# Patient Record
Sex: Male | Born: 1960
Health system: Southern US, Community
[De-identification: ages and names within clinical notes are randomized; demographics above are authoritative.]

## PROBLEM LIST (undated history)

## (undated) ENCOUNTER — Emergency Department (HOSPITAL_COMMUNITY): Admission: EM | Payer: Self-pay

## (undated) DIAGNOSIS — M1990 Unspecified osteoarthritis, unspecified site: Secondary | ICD-10-CM

## (undated) DIAGNOSIS — F319 Bipolar disorder, unspecified: Secondary | ICD-10-CM

## (undated) DIAGNOSIS — R011 Cardiac murmur, unspecified: Secondary | ICD-10-CM

## (undated) DIAGNOSIS — I10 Essential (primary) hypertension: Secondary | ICD-10-CM

## (undated) DIAGNOSIS — Z87442 Personal history of urinary calculi: Secondary | ICD-10-CM

## (undated) DIAGNOSIS — N200 Calculus of kidney: Secondary | ICD-10-CM

## (undated) DIAGNOSIS — B1920 Unspecified viral hepatitis C without hepatic coma: Secondary | ICD-10-CM

## (undated) DIAGNOSIS — F419 Anxiety disorder, unspecified: Secondary | ICD-10-CM

## (undated) DIAGNOSIS — S2239XA Fracture of one rib, unspecified side, initial encounter for closed fracture: Secondary | ICD-10-CM

## (undated) DIAGNOSIS — M4620 Osteomyelitis of vertebra, site unspecified: Secondary | ICD-10-CM

## (undated) DIAGNOSIS — F329 Major depressive disorder, single episode, unspecified: Secondary | ICD-10-CM

## (undated) DIAGNOSIS — F32A Depression, unspecified: Secondary | ICD-10-CM

## (undated) DIAGNOSIS — I959 Hypotension, unspecified: Secondary | ICD-10-CM

## (undated) HISTORY — PX: CHOLECYSTECTOMY: SHX55

## (undated) HISTORY — PX: OTHER SURGICAL HISTORY: SHX169

## (undated) HISTORY — PX: LITHOTRIPSY: SUR834

---
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
## 1991-05-24 DIAGNOSIS — S2249XA Multiple fractures of ribs, unspecified side, initial encounter for closed fracture: Secondary | ICD-10-CM

## 1991-05-24 HISTORY — DX: Multiple fractures of ribs, unspecified side, initial encounter for closed fracture: S22.49XA

## 1999-01-08 ENCOUNTER — Emergency Department (HOSPITAL_COMMUNITY): Admission: EM | Admit: 1999-01-08 | Discharge: 1999-01-08 | Payer: Self-pay | Admitting: Emergency Medicine

## 1999-01-08 ENCOUNTER — Encounter: Payer: Self-pay | Admitting: Emergency Medicine

## 2001-06-28 ENCOUNTER — Encounter (INDEPENDENT_AMBULATORY_CARE_PROVIDER_SITE_OTHER): Payer: Self-pay | Admitting: Specialist

## 2001-06-28 ENCOUNTER — Encounter: Payer: Self-pay | Admitting: Emergency Medicine

## 2001-06-28 ENCOUNTER — Inpatient Hospital Stay (HOSPITAL_COMMUNITY): Admission: EM | Admit: 2001-06-28 | Discharge: 2001-06-29 | Payer: Self-pay | Admitting: Emergency Medicine

## 2001-06-29 ENCOUNTER — Encounter: Payer: Self-pay | Admitting: Urology

## 2001-07-08 ENCOUNTER — Emergency Department (HOSPITAL_COMMUNITY): Admission: EM | Admit: 2001-07-08 | Discharge: 2001-07-08 | Payer: Self-pay | Admitting: Emergency Medicine

## 2001-07-08 ENCOUNTER — Encounter: Payer: Self-pay | Admitting: Emergency Medicine

## 2001-07-19 ENCOUNTER — Ambulatory Visit (HOSPITAL_BASED_OUTPATIENT_CLINIC_OR_DEPARTMENT_OTHER): Admission: RE | Admit: 2001-07-19 | Discharge: 2001-07-19 | Payer: Self-pay | Admitting: Urology

## 2001-07-19 ENCOUNTER — Encounter: Payer: Self-pay | Admitting: Urology

## 2002-05-23 ENCOUNTER — Emergency Department (HOSPITAL_COMMUNITY): Admission: EM | Admit: 2002-05-23 | Discharge: 2002-05-24 | Payer: Self-pay | Admitting: Emergency Medicine

## 2006-11-10 ENCOUNTER — Ambulatory Visit: Payer: Self-pay | Admitting: Internal Medicine

## 2006-11-13 ENCOUNTER — Ambulatory Visit: Payer: Self-pay | Admitting: Internal Medicine

## 2006-11-17 ENCOUNTER — Ambulatory Visit: Payer: Self-pay | Admitting: Internal Medicine

## 2007-02-19 DIAGNOSIS — F319 Bipolar disorder, unspecified: Secondary | ICD-10-CM | POA: Insufficient documentation

## 2007-03-13 ENCOUNTER — Ambulatory Visit: Payer: Self-pay | Admitting: Internal Medicine

## 2007-04-13 ENCOUNTER — Emergency Department (HOSPITAL_COMMUNITY): Admission: EM | Admit: 2007-04-13 | Discharge: 2007-04-13 | Payer: Self-pay | Admitting: Emergency Medicine

## 2007-04-25 ENCOUNTER — Ambulatory Visit (HOSPITAL_COMMUNITY): Admission: RE | Admit: 2007-04-25 | Discharge: 2007-04-25 | Payer: Self-pay | Admitting: Urology

## 2007-06-13 ENCOUNTER — Ambulatory Visit: Payer: Self-pay | Admitting: Internal Medicine

## 2007-06-13 LAB — CONVERTED CEMR LAB
Alkaline Phosphatase: 60 units/L (ref 39–117)
CO2: 24 meq/L (ref 19–32)
Calcium: 9.3 mg/dL (ref 8.4–10.5)
Creatinine, Ser: 0.85 mg/dL (ref 0.40–1.50)
Hep A IgM: NEGATIVE
Hepatitis B Surface Ag: NEGATIVE
Indirect Bilirubin: 0.3 mg/dL (ref 0.0–0.9)
Sodium: 138 meq/L (ref 135–145)
Total Bilirubin: 0.4 mg/dL (ref 0.3–1.2)
Total Protein: 7.6 g/dL (ref 6.0–8.3)

## 2007-06-22 ENCOUNTER — Ambulatory Visit: Payer: Self-pay | Admitting: *Deleted

## 2007-07-05 ENCOUNTER — Ambulatory Visit: Payer: Self-pay | Admitting: Gastroenterology

## 2007-08-15 ENCOUNTER — Ambulatory Visit: Payer: Self-pay | Admitting: Internal Medicine

## 2007-09-03 ENCOUNTER — Ambulatory Visit (HOSPITAL_COMMUNITY): Admission: RE | Admit: 2007-09-03 | Discharge: 2007-09-03 | Payer: Self-pay | Admitting: Gastroenterology

## 2007-09-03 ENCOUNTER — Encounter (INDEPENDENT_AMBULATORY_CARE_PROVIDER_SITE_OTHER): Payer: Self-pay | Admitting: Diagnostic Radiology

## 2007-09-12 ENCOUNTER — Ambulatory Visit: Payer: Self-pay | Admitting: Internal Medicine

## 2007-10-18 ENCOUNTER — Ambulatory Visit: Payer: Self-pay | Admitting: Gastroenterology

## 2007-10-19 ENCOUNTER — Ambulatory Visit: Payer: Self-pay | Admitting: Internal Medicine

## 2007-10-19 LAB — CONVERTED CEMR LAB
AST: 25 units/L (ref 0–37)
Albumin: 4.6 g/dL (ref 3.5–5.2)
Alkaline Phosphatase: 57 units/L (ref 39–117)
BUN: 15 mg/dL (ref 6–23)
CO2: 27 meq/L (ref 19–32)
Creatinine, Ser: 0.93 mg/dL (ref 0.40–1.50)
Eosinophils Absolute: 0.2 10*3/uL (ref 0.0–0.7)
Lymphocytes Relative: 28 % (ref 12–46)
Monocytes Relative: 6 % (ref 3–12)
Neutro Abs: 5.5 10*3/uL (ref 1.7–7.7)
Neutrophils Relative %: 63 % (ref 43–77)
Platelets: 241 10*3/uL (ref 150–400)
Potassium: 4.4 meq/L (ref 3.5–5.3)
Sodium: 140 meq/L (ref 135–145)

## 2007-10-20 ENCOUNTER — Emergency Department (HOSPITAL_COMMUNITY): Admission: EM | Admit: 2007-10-20 | Discharge: 2007-10-21 | Payer: Self-pay | Admitting: Emergency Medicine

## 2007-10-23 ENCOUNTER — Ambulatory Visit: Payer: Self-pay | Admitting: Internal Medicine

## 2007-10-24 ENCOUNTER — Emergency Department (HOSPITAL_COMMUNITY): Admission: EM | Admit: 2007-10-24 | Discharge: 2007-10-24 | Payer: Self-pay | Admitting: Emergency Medicine

## 2007-12-06 ENCOUNTER — Encounter: Admission: RE | Admit: 2007-12-06 | Discharge: 2007-12-27 | Payer: Self-pay | Admitting: Orthopedic Surgery

## 2007-12-13 ENCOUNTER — Ambulatory Visit: Payer: Self-pay | Admitting: Gastroenterology

## 2007-12-27 ENCOUNTER — Ambulatory Visit: Payer: Self-pay | Admitting: Internal Medicine

## 2007-12-27 LAB — CONVERTED CEMR LAB
AST: 29 units/L (ref 0–37)
Alkaline Phosphatase: 71 units/L (ref 39–117)
BUN: 15 mg/dL (ref 6–23)
Basophils Absolute: 0 10*3/uL (ref 0.0–0.1)
Chloride: 105 meq/L (ref 96–112)
Glucose, Bld: 94 mg/dL (ref 70–99)
Lymphs Abs: 2.5 10*3/uL (ref 0.7–4.0)
MCHC: 32.9 g/dL (ref 30.0–36.0)
MCV: 96.9 fL (ref 78.0–100.0)
Monocytes Absolute: 0.5 10*3/uL (ref 0.1–1.0)
Monocytes Relative: 6 % (ref 3–12)
Neutro Abs: 5.3 10*3/uL (ref 1.7–7.7)
Sodium: 137 meq/L (ref 135–145)
TSH: 0.412 microintl units/mL (ref 0.350–4.50)

## 2008-01-03 ENCOUNTER — Ambulatory Visit: Payer: Self-pay | Admitting: Internal Medicine

## 2008-01-24 ENCOUNTER — Ambulatory Visit: Payer: Self-pay | Admitting: Internal Medicine

## 2008-02-13 ENCOUNTER — Ambulatory Visit: Payer: Self-pay | Admitting: Internal Medicine

## 2008-02-14 ENCOUNTER — Ambulatory Visit: Payer: Self-pay | Admitting: Gastroenterology

## 2008-02-21 ENCOUNTER — Ambulatory Visit: Payer: Self-pay | Admitting: Gastroenterology

## 2008-03-06 ENCOUNTER — Ambulatory Visit: Payer: Self-pay | Admitting: Gastroenterology

## 2008-04-03 ENCOUNTER — Ambulatory Visit: Payer: Self-pay | Admitting: Gastroenterology

## 2008-05-01 ENCOUNTER — Ambulatory Visit: Payer: Self-pay | Admitting: Gastroenterology

## 2008-05-29 ENCOUNTER — Ambulatory Visit: Payer: Self-pay | Admitting: Gastroenterology

## 2008-06-03 ENCOUNTER — Ambulatory Visit: Payer: Self-pay | Admitting: Internal Medicine

## 2008-06-26 ENCOUNTER — Ambulatory Visit: Payer: Self-pay | Admitting: Gastroenterology

## 2008-07-24 ENCOUNTER — Ambulatory Visit: Payer: Self-pay | Admitting: Gastroenterology

## 2008-07-27 ENCOUNTER — Emergency Department (HOSPITAL_COMMUNITY): Admission: EM | Admit: 2008-07-27 | Discharge: 2008-07-27 | Payer: Self-pay | Admitting: Emergency Medicine

## 2008-08-07 ENCOUNTER — Ambulatory Visit: Payer: Self-pay | Admitting: Gastroenterology

## 2008-08-25 ENCOUNTER — Emergency Department (HOSPITAL_COMMUNITY): Admission: EM | Admit: 2008-08-25 | Discharge: 2008-08-25 | Payer: Self-pay

## 2008-08-28 ENCOUNTER — Ambulatory Visit: Payer: Self-pay | Admitting: Internal Medicine

## 2008-11-14 ENCOUNTER — Observation Stay (HOSPITAL_COMMUNITY): Admission: EM | Admit: 2008-11-14 | Discharge: 2008-11-14 | Payer: Self-pay | Admitting: Emergency Medicine

## 2008-11-16 ENCOUNTER — Emergency Department (HOSPITAL_COMMUNITY): Admission: EM | Admit: 2008-11-16 | Discharge: 2008-11-16 | Payer: Self-pay | Admitting: Emergency Medicine

## 2008-11-17 ENCOUNTER — Inpatient Hospital Stay (HOSPITAL_COMMUNITY): Admission: EM | Admit: 2008-11-17 | Discharge: 2008-11-20 | Payer: Self-pay | Admitting: Emergency Medicine

## 2008-11-19 ENCOUNTER — Encounter (INDEPENDENT_AMBULATORY_CARE_PROVIDER_SITE_OTHER): Payer: Self-pay | Admitting: General Surgery

## 2009-09-07 ENCOUNTER — Ambulatory Visit: Payer: Self-pay | Admitting: Internal Medicine

## 2009-09-07 LAB — CONVERTED CEMR LAB
ALT: 28 units/L (ref 0–53)
AST: 28 units/L (ref 0–37)
Albumin: 4.9 g/dL (ref 3.5–5.2)
BUN: 13 mg/dL (ref 6–23)
Basophils Absolute: 0.1 10*3/uL (ref 0.0–0.1)
CO2: 18 meq/L — ABNORMAL LOW (ref 19–32)
Chloride: 101 meq/L (ref 96–112)
Creatinine, Ser: 0.89 mg/dL (ref 0.40–1.50)
Eosinophils Absolute: 0.1 10*3/uL (ref 0.0–0.7)
HCT: 50.6 % (ref 39.0–52.0)
Hemoglobin: 16.4 g/dL (ref 13.0–17.0)
LDL Cholesterol: 148 mg/dL — ABNORMAL HIGH (ref 0–99)
MCV: 95.5 fL (ref 78.0–100.0)
Monocytes Absolute: 0.5 10*3/uL (ref 0.1–1.0)
Monocytes Relative: 7 % (ref 3–12)
Neutrophils Relative %: 73 % (ref 43–77)
RDW: 14 % (ref 11.5–15.5)
Sodium: 137 meq/L (ref 135–145)
Total Bilirubin: 0.6 mg/dL (ref 0.3–1.2)
Total CHOL/HDL Ratio: 4.4
Total Protein: 7.7 g/dL (ref 6.0–8.3)
VLDL: 24 mg/dL (ref 0–40)
WBC: 7.6 10*3/uL (ref 4.0–10.5)

## 2010-07-28 ENCOUNTER — Emergency Department (HOSPITAL_COMMUNITY)
Admission: EM | Admit: 2010-07-28 | Discharge: 2010-07-29 | Discharge status: Against Medical Advice | Payer: Self-pay | Attending: Emergency Medicine | Admitting: Emergency Medicine

## 2010-07-28 DIAGNOSIS — Z0389 Encounter for observation for other suspected diseases and conditions ruled out: Secondary | ICD-10-CM | POA: Insufficient documentation

## 2010-07-28 LAB — URINALYSIS, ROUTINE W REFLEX MICROSCOPIC
Hgb urine dipstick: NEGATIVE
Ketones, ur: NEGATIVE mg/dL
Nitrite: NEGATIVE
Protein, ur: NEGATIVE mg/dL

## 2010-08-30 LAB — DIFFERENTIAL
Basophils Relative: 0 % (ref 0–1)
Basophils Relative: 1 % (ref 0–1)
Eosinophils Absolute: 0.2 10*3/uL (ref 0.0–0.7)
Lymphocytes Relative: 15 % (ref 12–46)
Lymphocytes Relative: 9 % — ABNORMAL LOW (ref 12–46)
Lymphs Abs: 0.5 10*3/uL — ABNORMAL LOW (ref 0.7–4.0)
Lymphs Abs: 0.9 10*3/uL (ref 0.7–4.0)
Lymphs Abs: 1.1 10*3/uL (ref 0.7–4.0)
Monocytes Absolute: 0.4 10*3/uL (ref 0.1–1.0)
Monocytes Relative: 5 % (ref 3–12)
Monocytes Relative: 7 % (ref 3–12)
Neutro Abs: 3.9 10*3/uL (ref 1.7–7.7)
Neutro Abs: 4.7 10*3/uL (ref 1.7–7.7)
Neutrophils Relative %: 82 % — ABNORMAL HIGH (ref 43–77)

## 2010-08-30 LAB — URINALYSIS, ROUTINE W REFLEX MICROSCOPIC
Bilirubin Urine: NEGATIVE
Glucose, UA: NEGATIVE mg/dL
Nitrite: NEGATIVE
Protein, ur: NEGATIVE mg/dL
Specific Gravity, Urine: 1.022 (ref 1.005–1.030)
Urobilinogen, UA: 0.2 mg/dL (ref 0.0–1.0)
pH: 6.5 (ref 5.0–8.0)

## 2010-08-30 LAB — COMPREHENSIVE METABOLIC PANEL
ALT: 70 U/L — ABNORMAL HIGH (ref 0–53)
AST: 51 U/L — ABNORMAL HIGH (ref 0–37)
AST: 59 U/L — ABNORMAL HIGH (ref 0–37)
Albumin: 2.9 g/dL — ABNORMAL LOW (ref 3.5–5.2)
Albumin: 2.9 g/dL — ABNORMAL LOW (ref 3.5–5.2)
Albumin: 4 g/dL (ref 3.5–5.2)
Alkaline Phosphatase: 217 U/L — ABNORMAL HIGH (ref 39–117)
Alkaline Phosphatase: 336 U/L — ABNORMAL HIGH (ref 39–117)
BUN: 7 mg/dL (ref 6–23)
CO2: 24 mEq/L (ref 19–32)
Calcium: 8.7 mg/dL (ref 8.4–10.5)
Creatinine, Ser: 0.82 mg/dL (ref 0.4–1.5)
GFR calc Af Amer: >60 mL/min (ref >60)
GFR calc Af Amer: >60 mL/min (ref >60)
GFR calc Af Amer: >60 mL/min (ref >60)
GFR calc non Af Amer: >60 mL/min (ref >60)
GFR calc non Af Amer: >60 mL/min (ref >60)
Glucose, Bld: 114 mg/dL — ABNORMAL HIGH (ref 70–99)
Glucose, Bld: 82 mg/dL (ref 70–99)
Potassium: 3.6 mEq/L (ref 3.5–5.1)
Potassium: 3.7 mEq/L (ref 3.5–5.1)
Potassium: 3.9 mEq/L (ref 3.5–5.1)
Sodium: 136 mEq/L (ref 135–145)
Sodium: 136 mEq/L (ref 135–145)
Sodium: 138 mEq/L (ref 135–145)
Total Bilirubin: 4.4 mg/dL — ABNORMAL HIGH (ref 0.3–1.2)
Total Protein: 5.4 g/dL — ABNORMAL LOW (ref 6.0–8.3)
Total Protein: 5.5 g/dL — ABNORMAL LOW (ref 6.0–8.3)
Total Protein: 7.1 g/dL (ref 6.0–8.3)

## 2010-08-30 LAB — RAPID URINE DRUG SCREEN, HOSP PERFORMED
Amphetamines: NOT DETECTED
Barbiturates: NOT DETECTED
Barbiturates: NOT DETECTED
Benzodiazepines: POSITIVE — AB
Cocaine: NOT DETECTED
Opiates: NOT DETECTED

## 2010-08-30 LAB — CBC
HCT: 40.5 % (ref 39.0–52.0)
HCT: 41.4 % (ref 39.0–52.0)
Hemoglobin: 13.6 g/dL (ref 13.0–17.0)
Hemoglobin: 14.1 g/dL (ref 13.0–17.0)
MCHC: 34.5 g/dL (ref 30.0–36.0)
MCV: 96.1 fL (ref 78.0–100.0)
Platelets: 203 10*3/uL (ref 150–400)
Platelets: 216 10*3/uL (ref 150–400)
RBC: 4.11 MIL/uL — ABNORMAL LOW (ref 4.22–5.81)
RBC: 4.29 MIL/uL (ref 4.22–5.81)
RDW: 13.1 % (ref 11.5–15.5)
RDW: 13.2 % (ref 11.5–15.5)
WBC: 7.3 10*3/uL (ref 4.0–10.5)

## 2010-08-30 LAB — POCT I-STAT, CHEM 8
Chloride: 100 mEq/L (ref 96–112)
Creatinine, Ser: 1.1 mg/dL (ref 0.4–1.5)
Hemoglobin: 13.6 g/dL (ref 13.0–17.0)
Potassium: 3.6 mEq/L (ref 3.5–5.1)

## 2010-08-30 LAB — POCT CARDIAC MARKERS
CKMB, poc: 1.2 ng/mL (ref 1.0–8.0)
CKMB, poc: <1 ng/mL — ABNORMAL LOW (ref 1.0–8.0)
CKMB, poc: <1 ng/mL — ABNORMAL LOW (ref 1.0–8.0)
Myoglobin, poc: 34.1 ng/mL (ref 12–200)
Myoglobin, poc: 70.5 ng/mL (ref 12–200)
Troponin i, poc: <0.05 ng/mL (ref 0.00–0.09)

## 2010-08-30 LAB — APTT: aPTT: 29 seconds (ref 24–37)

## 2010-08-30 LAB — LIPASE, BLOOD: Lipase: 17 U/L (ref 11–59)

## 2010-08-30 LAB — PROTIME-INR: INR: 1 (ref 0.00–1.49)

## 2010-10-05 NOTE — Consult Note (Signed)
**Note Howard via Obfuscation** Fox, Howard NO.:  [id number]   MEDICAL RECORD NO.:  [id number]          PATIENT TYPE:  INP   LOCATION:  5118                         FACILITY:  [facility]   PHYSICIAN:  Jordan Hawks. [name], MD    DATE OF BIRTH:  24-May-1960   DATE OF CONSULTATION:  11/18/2008  DATE OF DISCHARGE:                                 CONSULTATION   REASON FOR CONSULTATION:  Probable choledocholithiasis.   REFERRING PHYSICIAN:  [facility].   HISTORY OF PRESENT ILLNESS:  This is a 50 year old gentleman with a past  medical history bipolar, hepatitis C, depression, chronic back pain, and  renal calculi, who was admitted to the hospital with complaints of right  upper quadrant epigastric pain.  The patient states that has been  ongoing on for several days and he also noted a change in his urine  color in that it was much darker.  The patient reports intermittent  history of this type of pain over the past 2 months; however, his pain  would abate and he would not have any symptoms.  Unfortunately of late,  the pain has been more constant.  Because of this issue, he has  presented to the emergency room on several occasions, and he has been  evaluated from a cardiac standpoint and ruled out.  During this  admission, he was found to have elevated transaminases and subsequently,  he was admitted to the hospital for further evaluation and treatment.  During the evaluation, he underwent a HIDA scan and was noted that there  is no visualization of the gallbladder suggestive of acute  cholecystitis.  Additionally, the patient appears to have an obstructive  pattern to his transaminases.   PAST MEDICAL HISTORY AND PAST SURGICAL HISTORY:  As stated above with  the addition of lung resection, lithotripsy x2, and multiple  intraabdominal injuries.   MEDICATIONS:  Lamictal, Celexa, amitriptyline, Dilaudid, Vicodin, and  Avelox.   ALLERGIES:  No known drug allergies.   FAMILY  HISTORY:  Noncontributory.   SOCIAL HISTORY:  Negative for alcohol, tobacco, or illicit drug use.   PHYSICAL EXAMINATION:  VITAL SIGNS:  Blood pressure is 140/79, heart  rate 81, pulse ox is 97% on room air, and respirations 14.  GENERAL:  The patient is in no acute distress, alert and oriented.  HEENT:  Normocephalic and atraumatic.  Extraocular muscles intact.  NECK:  Supple.  No lymphadenopathy.  LUNGS:  Clear to auscultation bilaterally.  CARDIOVASCULAR:  Regular rate and rhythm.  ABDOMEN:  Obese and soft.  Tender in the epigastrium and some in the  right upper quadrant.  No rebound or rigidity.  Positive bowel sounds.  EXTREMITIES:  No clubbing, cyanosis, or edema.   LABORATORY VALUES:  White blood cell count is 5.3, hemoglobin 13.5, and  platelets are 216.  Sodium 136, potassium 3.7, chloride 104, CO2 25,  glucose 82, BUN 6, creatinine 0.8, total bilirubin 4.4, alk phos 237,  AST 59, and ALT 84.   The ultrasound does reveal dilated CBD at 9 mm as well as intrahepatic  biliary ductal dilation and  multiple gallstones.   IMPRESSION:  1. Possible choledocholithiasis.  2. Cholelithiasis, possible cholecystis.   In light of his liver enzyme values, an ERCP is not unreasonable at this  time, and there is a high likelihood that he has also passed the stone.  His transaminases are declining, but the bilirubin shows mild increase,  however, it is known that this is the last value to start falling.  But  in light of the dilated ducts, again he is not unreasonable to perform  an ERCP.   Plan at this time is to perform the procedure and further  recommendations pending the findings.      Jordan Hawks [name], MD  Electronically Signed     PDH/MEDQ  D:  11/18/2008  T:  11/19/2008  Job:  [id number]

## 2010-10-05 NOTE — Discharge Summary (Signed)
Howard Fox, LOYA NO.:  [id number]   MEDICAL RECORD NO.:  [id number]          PATIENT TYPE:  INP   LOCATION:  5118                         FACILITY:  [facility]   PHYSICIAN:  Ollen Gross. Vernell Morgans, M.D. DATE OF BIRTH:  1960/06/05   DATE OF ADMISSION:  11/17/2008  DATE OF DISCHARGE:  11/20/2008                               DISCHARGE SUMMARY   ADMITTING PHYSICIAN:  Almond Lint, MD   CONSULTANTS:  Jordan Hawks. [name], MD, with GI.   CHIEF COMPLAINT/REASON FOR ADMISSION:  Mr. Bias is a 50 year old male  patient with several stable medical problems.  The most concerning is  that he has hepatitis C, at admission unknown baseline total bilirubin.  He presented to the ER with right upper quadrant abdominal pain for  several days, had been seen initially on 1 ER visit, apparently labs  were normal.  The patient was not having any significant nausea or  vomiting and discharged home, but he represented back on date of  admission with recurrent pain and nausea and vomiting.  He was afebrile.  Vital signs were stable.  His abdomen slightly distended and soft,  tender in the right upper quadrant.  He was found to have a total  bilirubin which had increased from 2.4-4.9 with elevated alkaline  phosphatase, AST, and ALT.  Because of these findings, it was concerning  that the patient had at least biliary colic secondary to cholelithiasis  since he had gallstones seen on ultrasound and given his underlying  hepatitis C it is uncertain that whether these are his baseline LFTs or  if he has associated choledocholithiasis.  The patient was admitted for  observation and consideration for surgical intervention.   Through the early part of hospitalization, the patient was monitored  closely.  His pain improved with n.p.o. status and administration of IV  pain medications.  When he presented the first time to the ER, again his  total bilirubin was 2.8, alkaline phosphatase 250, ALT 159, and  AST 232.  By admission, the patient's total bilirubin had increased to 4.9,  alkaline phosphatase was up to 336, AST 182, and ALT 167.  We asked the  patient for permission to obtain records from Dr. Sharon Mt, his  [facility] gastroenterologist who has followed his hepatitis.  The  patient has completed medical therapy for this.  He was treated with  [facility] for his chronic HCV genotype I grade 2 with stage I  disease on liver biopsy on September 20, 2007.  He was noted on their  evaluation to have no stigma of chronic liver disease on physical exam.  The old chart revealed lab work, the LFTs that were sent were from 2006  and at that time the patient had normal total bilirubin and other LFTs.  Because of this, GI consultation was obtained, Dr. Luisa Hart came and  evaluated the patient and given the patient's symptoms which were  consistent with biliary colic and not chronic in nature it was felt with  the elevation in total bilirubin and the possible normal to mildly  elevated  baseline LFTs an ERCP was indicated and the patient  subsequently underwent an ERCP on November 18, 2008, that showed a mildly  dilated common bile duct without any stones.  No stent was indicated and  the procedure was discontinued.  The patient tolerated it well without  any post-ERCP pancreatitis.   Subsequently on November 19, 2008, the patient was taken to the OR by Dr.  Carolynne Edouard were he underwent laparoscopic cholecystectomy with a normal  intraoperative cholangiogram for cholecystitis and cholelithiasis.  On  postop day #1, the patient was stable, tolerating a diet and oral pain  medications.  He was afebrile.  Vital signs were within normal limits.  He was saturating 95% on room air.  His chemistries were normal.  His  AST had decreased to 51, ALT to 70, alkaline phosphatase to 217, and  total bilirubin was steady at 4.2.  His abdomen was soft.  Incisions  were clean, dry, and intact except he had some  oozing of bloody drainage  from the umbilical incision during the night, but this has stopped.  Plans were to discharge the patient home and follow up with Surgery in 2  weeks for wound check and follow up with Dr. Sharon Mt in the next 2-  4 weeks for repeat LFTs and general followup of hepatitis.   FINAL DISCHARGE DIAGNOSES:  1. Abdominal pain secondary to biliary colic.  2. Transaminitis, uncertain if related to chronic hepatitis C versus      recent choledocholithiasis.  3. Status post laparoscopic cholecystectomy with normal intraoperative      cholangiogram.  4. Hypertension.  5. History of bipolar disorder.   DISCHARGE MEDICATIONS:  The patient will resume the following  medications:  1. Lamictal 200 mg b.i.d.  2. Celexa 40 mg daily.  3. Amitriptyline 100 mg daily.  4. Vicodin 5/325 one to two tablets every 4 hours as needed for pain.      The patient previously had this medication upon arrival, so he has      been given a new script with 30 tablets, no refills.  5. Lisinopril and hydrochlorothiazide 20/25 daily.   Return to work in 2 weeks.   ADDITIONAL ACTIVITY INSTRUCTIONS:  Increase activity slowly.  May walk  up steps.  May shower.  No lifting greater than 15 pounds for 2 weeks.  No driving for 1 week.  May walk up steps.   DIET:  No restrictions.   WOUND CARE:  Allow Steri-Strips to fall off and remove bandages in the  morning.   ADDITIONAL INSTRUCTIONS:  You are to call physician, i.e., the surgeon,  if:  1:  Fever greater than or equal to 101.5 degrees Fahrenheit.  1. New or increased belly pain.  2. Nausea, vomiting, diarrhea, or constipation.  3. Redness or drainage from wounds.   FOLLOWUP APPOINTMENTS:  1. You should have an appointment with the doctor of the [facility]      with [facility], [phone], on December 02, 2008, at 2:30      p.m.  2. You need to contact Dr. Sharon Mt for followup regarding your      hepatitis and the  elevated LFTs and recheck of LFTs in 2-4 weeks.     Allison L. Rennis Harding, N.POllen Gross. Vernell Morgans, M.D.  Electronically Signed   ALE/MEDQ  D:  11/20/2008  T:  11/20/2008  Job:  [id number]   cc:   Alba Destine, MD

## 2010-10-05 NOTE — H&P (Signed)
Howard Fox, Howard Fox NO.:  [id number]   MEDICAL RECORD NO.:  [id number]          PATIENT TYPE:  INP   LOCATION:  5118                         FACILITY:  [facility]   PHYSICIAN:  Almond Lint, MD       DATE OF BIRTH:  [birth year]   DATE OF ADMISSION:  11/16/2008  DATE OF DISCHARGE:                              HISTORY & PHYSICAL   CHIEF COMPLAINT:  Abdominal pain.   HISTORY OF PRESENT ILLNESS:  Mr. Dolley is a 50 year old male with  hepatitis C and right upper quadrant pain from several days.  Today, he  came to the hospital in the ER, and had cardiac rule out.  His LFTs were  currently elevated because of the hepatitis C, and he sees Hepatology at  [facility] who has come out from [facility].  Currently, he has  pain in the epigastrium in the right upper quadrant.  He endorses  sensation of indigestion.  He has tried Pepto-Bismol and Tums and other  over-the-counter aids with no relief.  He has had some constipation but  no diarrhea.  His pain this morning got worse after eating a fried  chicken biscuit.  Today, he is unable to be pain free for more than an  hour with administration of IV narcotics.   PAST MEDICAL HISTORY:  Bipolar disorder, current back pain, depression,  hepatitis C, and renal calculi.   PAST SURGICAL HISTORY:  Lung resection, lithotripsy times at least 2 and  a fall in which Dr. Lenard Forth repaired multiple intra-abdominal injuries to  what he describes is a stomach, small and large intestines and Dr.  Kendal Hymen repaired some rib fractures.   MEDICATIONS:  Lamictal, Celexa, amitriptyline, Dilaudid, and Vicodin.   ALLERGIES:  None.   FAMILY HISTORY:  Significant family history of CVA and coronary artery  disease.   SOCIAL HISTORY:  He is accompanied by his mother.  He is a former smoker  within the last year.   PHYSICAL EXAMINATION:  VITAL SIGNS:  Temperature is 98, pulse 94, blood  pressure 147/83, 97% on room air, and respiratory rate  18.  GENERAL:  He is alert and oriented x3 and looks uncomfortable.  PSYCHIATRIC:  Mood and affect normal.  HEENT:  Normocephalic and atraumatic.  He does have scleral icterus.  CARDIOVASCULAR:  Regular rate and rhythm.  LUNGS:  Clear to auscultation bilaterally.  ABDOMEN:  Distended, soft, no fluid wave.  Positive tenderness with  flinching in right upper quadrant.  EXTREMITIES:  Warm.   Ultrasound demonstrates intra and extrahepatic biliary ductal dilatation  and many gallstones.  Lipase is 15.  Question of pancreatic mass.  Electrolytes are essentially normal with an elevated glucose of 125.  LFTs reveal bilirubin of 4.9, SGOT 182, 167, and alkaline phosphatase  66.  WBC is 5.7, H and H 13.6 and 39.5, platelet count 179,000.   ASSESSMENT AND PLAN:  Mr. Oki is a 50 year old male with symptomatic  cholelithiasis.  He is unable to be relieved of pain.  He will be given  IV fluids, IV antibiotics, IV pain medication, and he  will been n.p.o.  for  potential laparoscopic cholecystectomy.  The physician in the  morning in the morning nay elect to order a HIDA to confirm whether or  not he has cystic duct obstruction.      Almond Lint, MD  Electronically Signed     FB/MEDQ  D:  11/17/2008  T:  11/17/2008  Job:  [id number]

## 2010-10-05 NOTE — Op Note (Signed)
NAMEJEFFREE, CAZEAU NO.:  [id number]   MEDICAL RECORD NO.:  [id number]          PATIENT TYPE:  AMB   LOCATION:  DAY                          FACILITY:  [facility]   PHYSICIAN:  Maretta Bees. Vonita Moss, M.D.DATE OF BIRTH:  1960/11/19   DATE OF PROCEDURE:  04/25/2007  DATE OF DISCHARGE:                               OPERATIVE REPORT   __________ .   PREOPERATIVE DIAGNOSIS:  Left ureteral stone.   POSTOPERATIVE DIAGNOSIS:  Left ureteral stone, status post laser  lithotripsy.   OPERATION/PROCEDURE:  1. Pancystourethroscopy.  2. Left retrograde pyelography.  3. Left ureteroscopy with laser lithotripsy.  4. Placement of left ureteral stent, 6-French x 28 cm double-J.   INDICATIONS:  This is a 50 year old gentleman who came in with left-  sided stone.  He is scheduled electively today for laser lithotripsy of  that stone.   DESCRIPTION OF PROCEDURE:  The patient was brought back to the operating  room.  After performing the preoperative time out, giving him  preoperative antibiotics, and after this successful induction of general  endotracheal anesthesia, the patient was prepped and draped in the  dorsal lithotomy position, noting that all pressure points were padded  appropriately.  We then used a 22-French sheath with a 12.5 degree lens  to do pancystourethroscopy.  The patient's urethra appeared normal.  There is no evidence of stricture, tumor, foreign body, or any other  abnormality.  The patient's bladder also appeared normal.  There is no  evidence of tumor, foreign body, stone, diverticula, or any other  abnormality.  Both ureteral orifices were seen at the latter aspects of  the trigone effluxing clear urine.   At this point the left ureteral orifice was cannulated with an ureteral  catheter.  A retrograde pyelogram was shot.  This showed no evidence of  stricture.  However, a large 1.2 cm stone could be seen at the level of  the sacroiliac junction.  Then we  attempted to place a glide wire with  difficulty past the stone.  With advancing the ureteral catheter, the  glide wire was ultimately passed past the stone.  The ureteral catheter  was advanced past the stone and a stiff wire was placed as her safety  wire.  Then we used the semi-rigid ureteroscope to navigate the  patient's ureter.  However, at the level of the sacroiliac junction, we  had difficulty passing the scope.  The ureter at that point was slightly  tight.  We then used the access sheath to dilate this in a non-traumatic  fashion directly, using fluoroscopic assistance.  With the access sheath  in place, we used the flexible ureteroscope to visualize the stone.   With the stone visualized with the ureteroscope, we used the laser fiber  to systematically break up the stone into small fragments.  This was  done avoiding injury to the surrounding structures including the ureter.  In between lasering the stones, small fragments were removed with the  help of a nitinol basket.  Once all the stone had been broken up and we  attempted to remove  all stones with the basket, there were some small  fragments that were continued to be left in place.  Due to difficulty in  positioning the scope, we were unable to pull out these small fragments  and instead elected to place the ureteral stent rather than risk further  trauma to the ureter.  We replaced the glide wire, verified it  fluoroscopically and passed a 6 x 28 French ureter stent.  Good proximal  and distal curls were seen with the fluoroscopic vision, the bladder was  emptied and at this point the procedure was ended.   Please note Dr. Vonita Moss was present throughout the entirety of the  case.   ESTIMATED BLOOD LOSS:  Minimal.   URINARY OUTPUT:  Unrecorded.   DRAINS:  A 6 x 28 French left ureteral stent.   COMPLICATIONS:  None apparent.   SPECIMENS:  Stone for stone analysis.   DISPOSITION:  The patient went to  PACU.      Terie Purser, MD      Maretta Bees. Vonita Moss, M.D.  Electronically Signed    JH/MEDQ  D:  04/25/2007  T:  04/26/2007  Job:  [id number]

## 2010-10-05 NOTE — Op Note (Signed)
Howard Fox, Howard Fox NO.:  [id number]   MEDICAL RECORD NO.:  [id number]          PATIENT TYPE:  INP   LOCATION:  5118                         FACILITY:  [facility]   PHYSICIAN:  Ollen Gross. Vernell Morgans, M.D. DATE OF BIRTH:  12-22-1960   DATE OF PROCEDURE:  11/19/2008  DATE OF DISCHARGE:                               OPERATIVE REPORT   PREOPERATIVE DIAGNOSIS:  Cholecystitis with cholelithiasis.   POSTOPERATIVE DIAGNOSIS:  Cholecystitis with cholelithiasis.   PROCEDURE:  Laparoscopic cholecystectomy with intraoperative  cholangiogram.   SURGEON:  Ollen Gross. Vernell Morgans, MD   ASSISTANT:  Letha Cape, PA   ANESTHESIA:  General endotracheal.   PROCEDURE:  After informed consent was obtained, the patient was brought  to the operating room and placed in supine position on the operating  room table.  After adequate induction of general anesthesia, the  patient's abdomen was prepped with Hibiclens and draped in usual sterile  manner.  The area below the umbilicus was infiltrated with 0.25%  Marcaine.  A small incision was made with 15-blade knife.  This incision  was carried down through the subcutaneous tissue bluntly with hemostat  and [employer] retractors until linea alba was identified.  The linea  alba was incised with a 15-blade knife and each side was grasped with  Kocher clamps and elevated anteriorly.  The preperitoneal space was then  probed bluntly with a hemostat until the peritoneum was opened and  access was gained to the abdominal cavity.  A 0 Vicryl pursestring  stitch was placed in the fascia around the opening.  A Hasson cannula  was placed through the opening and anchored in place with previously  placed Vicryl pursestring stitch.  The abdomen was insufflated with  carbon dioxide without difficulty.  The patient was placed in reverse  Trendelenburg position and rotated with the right side up.  A  laparoscope was inserted through the Hasson cannula and the  right upper  quadrant was inspected.  The patient did have some adhesions along the  midline, but it did not interfere with our view of the right upper  quadrant.  Next, the epigastric region was infiltrated with 0.25%  Marcaine.  A small incision was made a 15-blade knife and a 10-mm port  was placed bluntly through this incision into the abdominal cavity under  direct vision.  Sites were then chosen laterally on the right side of  the abdomen for placement 5 mm ports.  Each of these areas were  infiltrated with 0.25% Marcaine.  Small stab incisions were made with a  15-blade knife.  The 5 mm ports were placed bluntly through these  incisions into the abdominal cavity under direct vision.  A blunt  grasper was placed through the lateral most 5-mm port and used to grasp  the dome of gallbladder and elevated anteriorly and superiorly.  There  were some omental adhesions to the body of the gallbladder which were  taken down with a blunt dissector and some with electrocautery.  Some of  these adhesions were also taken down with laparoscopic scissors close to  the duodenum.  Another blunt grasper was then placed through the other 5-  mm port and used to retract on the body and neck of the gallbladder.  A  dissector was used to open the peritoneal reflection at the gallbladder  neck area with the electrocautery.  Blunt dissection was carried out in  this area until the gallbladder neck and cystic duct junction was  readily identified and a good window was created.  A single clip was  placed on the gallbladder neck.  A small ductotomy was made just below  the clip with laparoscopic scissors.  A 14-gauge Angiocath was placed  percutaneously through the anterior abdominal wall under direct vision.  A Reddick cholangiogram catheter was placed through the Angiocath and  flushed.  The Reddick catheter was then placed within the cystic duct  and anchored in place with the clip.  Cholangiogram was  obtained that  showed no filling defects, good emptying in duodenum, and adequate  length on the cystic duct.  The anchoring clip and catheters were then  removed from the patient.  Three clips were placed proximally on the  cystic duct and duct was divided the 2 sets of clips.  Posterior to  this, the cystic artery was identified with a couple of branches.  Each  of these branches was dissected bluntly in circumferential manner until  a good window was created.  Two clips were placed proximally and one  distally on each of the branches and the arteries were divided between  the two.  Next, a laparoscopic hook cautery device was used to separate  the gallbladder from liver bed.  Prior to completely detaching the  gallbladder from liver bed, the liver bed was inspected and several  small bleeding points coagulated with electrocautery until the area was  completely hemostatic.  The gallbladder was then detached and wrested  away from liver bed without difficulty with the hook cautery.  Laparoscopic bag was inserted through the epigastric port.  The  gallbladder was placed in the bag and bag was sealed.  The abdomen was  then irrigated with copious amounts of saline until the effluent was  clear.  The laparoscope was moved to the epigastric port.  A gallbladder  grasper was placed through the Hasson cannula and used to grasp the  opening of the bag.  The bag with the gallbladder was removed with the  Hasson cannula through the infraumbilical port without difficulty.  The  fascial defect was closed with previously placed Vicryl pursestring  stitch as well as another figure-of-eight 0 Vicryl stitch.  The rest of  the ports were then removed and the gas was allowed to escape.  The skin  incisions were all closed with interrupted 4-0 Monocryl subcuticular  stitches and Dermabond dressings were applied.  The patient tolerated  the procedure well.  At the end of the case, all needle, sponge, and   instrument counts were correct.  The patient was awakened and taken to  recovery in stable condition.      Ollen Gross. Vernell Morgans, M.D.  Electronically Signed     PST/MEDQ  D:  11/19/2008  T:  11/20/2008  Job:  [id number]

## 2010-10-08 NOTE — Op Note (Signed)
[facility]  Patient:    Howard Fox, Howard Fox Visit Number: [id number] MRN: [id number]          Service Type: MED Location: [id number] Attending Physician:  Monica Becton Dictated by:   Claudette Laws, M.D. Proc. Date: 06/29/01 Admit Date:  06/28/2001                             Operative Report  PREOPERATIVE DIAGNOSES: 1. Large, obstructing calculus in the prostatic urethra. 2. A 1.6 cm obstructing distal left ureteral calculus.  POSTOPERATIVE DIAGNOSES: 1. Large, obstructing calculus in the prostatic urethra. 2. A 1.6 cm obstructing distal left ureteral calculus.  OPERATION: 1. Cystoscopy, and holmium laser lithotripsy of bladder calculus. 2. Left retrograde ureterogram, and insertion of a 6 French 26 cm double-J    stent.  SURGEON:  Claudette Laws, M.D.  HISTORY:  This is a 50 year old man, who presented to the emergency room late last evening with difficulty urinating.  He has a history of kidney stones.  A CAT scan showed an 8-9 mm stone in the prostatic urethra.  He also was noted to have a 1.6 cm stone obstructing the left kidney at the junction of the mid and distal left ureter.  However, his main complaint was difficulty urinating. He has passed several stones in the past which he brought in with him.  The patient otherwise is in good health.  No known drug allergies.  We went over treatment options, and the decision was made to proceed to lithotripsy of the prostatic stone and also insertion of a double-J stent for now and then further disposition pending treatment of the ureteral calculus.  He also has a large stone in the lower pole of the left kidney.  The patient understands and agrees to the proposed surgery.  DESCRIPTION OF PROCEDURE:  The patient was prepped and draped in the dorsal lithotomy position under intubated general anesthesia.  Urethroscopy confirmed the stone right at the bladder neck area which I was able to  push back into the bladder.  At this point, using the 22 French Olympus cystoscope and the small laser fiber and the VersaPulse laser, lithotripsy was performed upon the stone, breaking it up into multiple fine particles which then we subsequently irrigated out.  The rest of the bladder looked normal.  No tumors, no calculi, normal ureteral orifices, and a nonobstructing-appearing prostate.  Our attention was then turned to the left ureter where we intubated it with a .038 Glidewire through a 6 [place] open-ended ureteral catheter.  Retrograde studies showed a complete block right at the stone which was overlying the sacrum.  After about 30 minutes of manipulation, including the use of different guidewires and injection of Xylocaine jelly, I was finally able to negotiate the Glidewire by the stone up into the kidney.  We then backed out the open-ended catheter and put over a 6 French 26 cm double-J stent over the Glidewire and then passed it up without difficulty.  The proximal end was curled up in the renal pelvis.  The distal end was curled up in the bladder. The bladder was emptied.  I injected 10 cc of Xylocaine jelly per urethra and also a B&O suppository per rectum for bladder spasm and pain control.  The patient was then taken back to the recovery room in a satisfactory condition. Dictated by:   Claudette Laws, M.D. Attending Physician:  Javier Glazier  Homero Fellers DD:  06/29/01 TD:  06/29/01 Job: [id number]

## 2011-02-15 LAB — CBC
HCT: 47.5
MCHC: 34.8
MCV: 92.2
Platelets: 193
RDW: 13.1
WBC: 7.4

## 2011-03-01 LAB — BASIC METABOLIC PANEL
BUN: 18
Creatinine, Ser: 1.45
GFR calc non Af Amer: 53 — ABNORMAL LOW
Potassium: 3.8

## 2011-03-01 LAB — URINE MICROSCOPIC-ADD ON

## 2011-03-01 LAB — DIFFERENTIAL
Eosinophils Absolute: 0.1 — ABNORMAL LOW
Eosinophils Relative: 1
Lymphocytes Relative: 23
Lymphs Abs: 2.1
Monocytes Absolute: 0.9
Monocytes Relative: 10

## 2011-03-01 LAB — URINALYSIS, ROUTINE W REFLEX MICROSCOPIC
Glucose, UA: NEGATIVE
pH: 6

## 2011-03-01 LAB — CBC
Platelets: 203
WBC: 9.5

## 2012-08-26 ENCOUNTER — Emergency Department (HOSPITAL_COMMUNITY)
Admission: EM | Admit: 2012-08-26 | Discharge: 2012-08-26 | Disposition: A | Discharge status: Home / Self Care | Payer: Self-pay | Attending: Emergency Medicine | Admitting: Emergency Medicine

## 2012-08-26 ENCOUNTER — Emergency Department (HOSPITAL_COMMUNITY): Payer: Self-pay

## 2012-08-26 ENCOUNTER — Encounter (HOSPITAL_COMMUNITY): Payer: Self-pay | Admitting: *Deleted

## 2012-08-26 DIAGNOSIS — R109 Unspecified abdominal pain: Secondary | ICD-10-CM

## 2012-08-26 DIAGNOSIS — N200 Calculus of kidney: Secondary | ICD-10-CM | POA: Insufficient documentation

## 2012-08-26 DIAGNOSIS — Z79899 Other long term (current) drug therapy: Secondary | ICD-10-CM | POA: Insufficient documentation

## 2012-08-26 DIAGNOSIS — Z87442 Personal history of urinary calculi: Secondary | ICD-10-CM | POA: Insufficient documentation

## 2012-08-26 DIAGNOSIS — R110 Nausea: Secondary | ICD-10-CM | POA: Insufficient documentation

## 2012-08-26 DIAGNOSIS — F319 Bipolar disorder, unspecified: Secondary | ICD-10-CM | POA: Insufficient documentation

## 2012-08-26 DIAGNOSIS — R319 Hematuria, unspecified: Secondary | ICD-10-CM | POA: Insufficient documentation

## 2012-08-26 DIAGNOSIS — R1030 Lower abdominal pain, unspecified: Secondary | ICD-10-CM

## 2012-08-26 DIAGNOSIS — Z8619 Personal history of other infectious and parasitic diseases: Secondary | ICD-10-CM | POA: Insufficient documentation

## 2012-08-26 DIAGNOSIS — F17200 Nicotine dependence, unspecified, uncomplicated: Secondary | ICD-10-CM | POA: Insufficient documentation

## 2012-08-26 DIAGNOSIS — I10 Essential (primary) hypertension: Secondary | ICD-10-CM | POA: Insufficient documentation

## 2012-08-26 HISTORY — DX: Bipolar disorder, unspecified: F31.9

## 2012-08-26 HISTORY — DX: Essential (primary) hypertension: I10

## 2012-08-26 HISTORY — DX: Unspecified viral hepatitis C without hepatic coma: B19.20

## 2012-08-26 HISTORY — DX: Calculus of kidney: N20.0

## 2012-08-26 LAB — CBC
HCT: 45.6 % (ref 39.0–52.0)
MCH: 30.6 pg (ref 26.0–34.0)
MCV: 90.1 fL (ref 78.0–100.0)
Platelets: 218 10*3/uL (ref 150–400)
RDW: 12.5 % (ref 11.5–15.5)
WBC: 7.2 10*3/uL (ref 4.0–10.5)

## 2012-08-26 LAB — URINALYSIS, ROUTINE W REFLEX MICROSCOPIC
Bilirubin Urine: NEGATIVE
Glucose, UA: NEGATIVE mg/dL
Ketones, ur: NEGATIVE mg/dL
Nitrite: NEGATIVE
Protein, ur: NEGATIVE mg/dL
pH: 6.5 (ref 5.0–8.0)

## 2012-08-26 LAB — BASIC METABOLIC PANEL
BUN: 14 mg/dL (ref 6–23)
Calcium: 9.9 mg/dL (ref 8.4–10.5)
Chloride: 98 mEq/L (ref 96–112)
Creatinine, Ser: 0.86 mg/dL (ref 0.50–1.35)
GFR calc Af Amer: >90 mL/min (ref >90)

## 2012-08-26 MED ORDER — PERCOCET 5-325 MG PO TABS: 1.0000 | ORAL_TABLET | Freq: Four times a day (QID) | ORAL | Status: DC | PRN

## 2012-08-26 MED ORDER — HYDROMORPHONE HCL PF 1 MG/ML IJ SOLN: 1.0000 mg | Freq: Once | INTRAMUSCULAR | Status: DC

## 2012-08-26 MED ORDER — KETOROLAC TROMETHAMINE 30 MG/ML IJ SOLN: 30.0000 mg | Freq: Once | INTRAMUSCULAR | Status: DC

## 2012-08-26 MED ORDER — KETOROLAC TROMETHAMINE 30 MG/ML IJ SOLN
30.0000 mg | Freq: Once | INTRAMUSCULAR | Status: AC
  Administered 2012-08-26: 30 mg via INTRAVENOUS
  Filled 2012-08-26: qty 1

## 2012-08-26 MED ORDER — HYDROMORPHONE HCL PF 2 MG/ML IJ SOLN
2.0000 mg | Freq: Once | INTRAMUSCULAR | Status: AC
  Administered 2012-08-26: 2 mg via INTRAVENOUS
  Filled 2012-08-26: qty 1

## 2012-08-26 MED ORDER — ONDANSETRON HCL 4 MG/2ML IJ SOLN
4.0000 mg | Freq: Once | INTRAMUSCULAR | Status: AC
  Administered 2012-08-26: 4 mg via INTRAVENOUS
  Filled 2012-08-26: qty 2

## 2012-08-26 MED ORDER — NAPROXEN 375 MG PO TABS: 375.0000 mg | ORAL_TABLET | Freq: Two times a day (BID) | ORAL | Status: DC

## 2012-08-26 NOTE — ED Notes (Signed)
PA- Paz at bedside.  

## 2012-08-26 NOTE — ED Notes (Signed)
Pt remains in CT scan.

## 2012-08-26 NOTE — ED Notes (Signed)
Pt states for the past 3 weeks has been passing a kidney stone, states now having lower abdominal to penis pain, states has a hx of kidney stones.

## 2012-08-26 NOTE — ED Provider Notes (Signed)
History     CSN: [id number]  Arrival date & time 08/26/12  1400   First MD Initiated Contact with Patient 08/26/12 1423      Chief Complaint  Patient presents with  . Nephrolithiasis    (Consider location/radiation/quality/duration/timing/severity/associated sxs/prior treatment) Patient is a 52 y.o. male presenting with flank pain. The history is provided by the patient.  Flank Pain This is a recurrent problem. Episode onset: 3 weeks ago, worsened 3-4 dys ago. The problem occurs constantly. The problem has been gradually worsening. Associated symptoms include abdominal pain, nausea and urinary symptoms (hematuria and 2 days ago urine was brown). Pertinent negatives include no anorexia, arthralgias, change in bowel habit, chest pain, chills, congestion, coughing, diaphoresis, fatigue, fever, headaches, neck pain, numbness, rash, sore throat, vomiting or weakness. The symptoms are aggravated by walking, twisting and coughing. Treatments tried: 200mg  ibprofen x 4. The treatment provided no relief.    Past Medical History  Diagnosis Date  . Kidney stones   . Bipolar 1 disorder   . Hepatitis C   . Hypertension     Past Surgical History  Procedure Laterality Date  . Cholecystectomy      History reviewed. No pertinent family history.  History  Substance Use Topics  . Smoking status: Current Every Day Smoker  . Smokeless tobacco: Never Used  . Alcohol Use: Yes      Review of Systems  Constitutional: Positive for activity change. Negative for fever, chills, diaphoresis and fatigue.  HENT: Negative for ear pain, congestion, sore throat, neck pain and sinus pressure.   Eyes: Negative for visual disturbance.  Respiratory: Negative for cough.   Cardiovascular: Negative for chest pain.  Gastrointestinal: Positive for nausea and abdominal pain. Negative for vomiting, anorexia and change in bowel habit.  Genitourinary: Positive for urgency, flank pain and difficulty urinating.  Negative for dysuria.  Musculoskeletal: Negative for arthralgias.  Skin: Negative for color change and rash.  Neurological: Negative for dizziness, weakness, numbness and headaches.  Psychiatric/Behavioral: Negative for confusion.  All other systems reviewed and are negative.    Allergies  Codeine  Home Medications   Current Outpatient Rx  Name  Route  Sig  Dispense  Refill  . ALPRAZolam (XANAX) 1 MG tablet   Oral   Take 1 mg by mouth 2 (two) times daily.         . citalopram (CELEXA) 40 MG tablet   Oral   Take 40 mg by mouth daily.         Marland Kitchen ibuprofen (ADVIL,MOTRIN) 200 MG tablet   Oral   Take 800 mg by mouth every 6 (six) hours as needed for pain.         Marland Kitchen lamoTRIgine (LAMICTAL) 100 MG tablet   Oral   Take 100 mg by mouth every other day.         . lisinopril-hydrochlorothiazide (PRINZIDE,ZESTORETIC) 20-25 MG per tablet   Oral   Take 1 tablet by mouth daily.           BP 132/77  Pulse 107  Temp(Src) 98.8 F (37.1 C) (Oral)  Resp 18  SpO2 98%  Physical Exam  Nursing note and vitals reviewed. Constitutional: He is oriented to person, place, and time. He appears well-developed and well-nourished. He appears distressed.  HENT:  Head: Normocephalic and atraumatic.  Eyes: Conjunctivae and EOM are normal.  Neck: Normal range of motion. Neck supple.  Cardiovascular: Normal rate, regular rhythm and normal heart sounds.   Pulmonary/Chest: Effort normal.  Abdominal: Soft. Normal appearance and bowel sounds are normal. He exhibits no distension, no ascites and no pulsatile midline mass. There is tenderness. There is CVA tenderness.  L CVA tenderness, left groin ttp.   Genitourinary:  testicles non tender with right slightly higher (chonic per pt). Penis circumcised w normal appearance. No inguinal hernias or palpable cords.   Musculoskeletal: Normal range of motion.  Neurological: He is alert and oriented to person, place, and time.  strength 5/5  bilaterally, distal sensation intact. Gait hunched over d/t pain   Skin: Skin is warm and dry. He is not diaphoretic.  Psychiatric: He has a normal mood and affect. His behavior is normal.    ED Course  Procedures (including critical care time)  Labs Reviewed  BASIC METABOLIC PANEL - Abnormal; Notable for the following:    Sodium 133 (*)    Glucose, Bld 105 (*)    All other components within normal limits  CBC  URINALYSIS, ROUTINE W REFLEX MICROSCOPIC   Ct Abdomen Pelvis Wo Contrast  08/26/2012  *RADIOLOGY REPORT*  Clinical Data: Left flank and lower quadrant pain radiating into left testicular region. History of multiple renal calculi with prior surgery.  CT ABDOMEN AND PELVIS WITHOUT CONTRAST  Technique:  Multidetector CT imaging of the abdomen and pelvis was performed following the standard protocol without intravenous contrast.  Comparison: 04/13/2007  Findings: Visualized lung bases show stable postoperative changes related to the prior left thoracotomy and chest wall reconstruction.  Both kidneys show no evidence of hydronephrosis. There is a small nonobstructing calculus in the lower pole collecting system of the left kidney measuring approximately 4 mm. No ureteral or bladder calculi are identified.  Unenhanced appearance of the liver, pancreas, spleen, adrenal glands and bowel are unremarkable.  Gallbladder has been removed. No masses or enlarged lymph nodes are identified.  No hernias are seen.  No free fluid or abnormal fluid collections.  No abnormal inflammatory process is identified.  Degenerative disc disease and vacuum disc present at L5-S1.  IMPRESSION: No acute renal obstruction identified.  A nonobstructing 4 mm calculus is present in the lower pole of the left kidney.   Original Report Authenticated By: Irish Lack, M.D.      No diagnosis found. BP 132/77  Pulse 107  Temp(Src) 98.8 F (37.1 C) (Oral)  Resp 18  SpO2 98%    MDM  Left flank and groin pain   Patient  is a 52-year-old male with a history of kidney stones are presents emergency department complaining of flank pain and groin pain.  Labs and imaging reviewed without signs of recently passed kidney stone or current kidney stone obstruction.  Patient is advised to followup with his urologist in regards to today's presentation.  Pain managed well in the emergency department and will discharge on short pain medication prescription.  Incidental finding of degenerative disc disease discussed with patient as repeat physical exam showing no focal neuro deficits.  Patient ambulates without difficulty.  Patient appears stable for discharge.      Jaci Carrel, [place] 08/26/12 [phone]

## 2012-08-26 NOTE — ED Notes (Signed)
Patient transported to CT 

## 2012-08-26 NOTE — ED Notes (Signed)
Pt states he has a ride home

## 2012-08-27 NOTE — ED Provider Notes (Signed)
Medical screening examination/treatment/procedure(s) were performed by non-physician practitioner and as supervising physician I was immediately available for consultation/collaboration.   Glynn Octave, MD 08/27/12 1248

## 2013-04-02 ENCOUNTER — Other Ambulatory Visit: Payer: Self-pay | Admitting: Internal Medicine

## 2013-04-03 ENCOUNTER — Other Ambulatory Visit: Payer: Self-pay | Admitting: Internal Medicine

## 2013-04-03 NOTE — Telephone Encounter (Signed)
This rx has been called in

## 2013-04-03 NOTE — Telephone Encounter (Signed)
RX Xanax 1 mg #90 one tab , three times daily prn , zero refills

## 2013-04-04 ENCOUNTER — Encounter: Payer: Self-pay | Admitting: Internal Medicine

## 2013-04-05 ENCOUNTER — Encounter: Payer: Self-pay | Admitting: Internal Medicine

## 2013-04-25 ENCOUNTER — Ambulatory Visit: Payer: Self-pay | Admitting: Internal Medicine

## 2013-04-25 ENCOUNTER — Encounter: Payer: Self-pay | Admitting: Internal Medicine

## 2013-04-25 VITALS — BP 116/82 | HR 88 | Temp 97.9°F | Resp 18 | Ht 72.0 in | Wt 225.6 lb

## 2013-04-25 DIAGNOSIS — E782 Mixed hyperlipidemia: Secondary | ICD-10-CM | POA: Insufficient documentation

## 2013-04-25 DIAGNOSIS — I10 Essential (primary) hypertension: Secondary | ICD-10-CM

## 2013-04-25 DIAGNOSIS — E559 Vitamin D deficiency, unspecified: Secondary | ICD-10-CM | POA: Insufficient documentation

## 2013-04-25 DIAGNOSIS — R7309 Other abnormal glucose: Secondary | ICD-10-CM
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
## 2013-04-25 NOTE — Patient Instructions (Addendum)
Continue diet & medications same as discussed.   Further disposition pending lab results.    Insulin Resistance Blood sugar (glucose) levels are controlled by a hormone called insulin. Insulin is made by your pancreas. When your blood glucose goes up, insulin is released into your blood. Insulin is required for your body to function normally. However, your body can become resistant to your own insulin or to insulin given to treat diabetes. In either case, insulin resistance can lead to serious problems. These problems include:  Type 2 diabetes.  Heart disease.  High blood pressure.  Stroke.  Polycystic ovary syndrome.  Fatty liver. CAUSES  Insulin resistance can develop for many different reasons. It is more likely to happen in people with these conditions or characteristics:  Obesity.  Inactivity.  Pregnancy.  High blood pressure.  Stress.  Steroid use.  Infection or severe illness.  Increased levels of cholesterol and triglycerides. SYMPTOMS  There are no symptoms. You may have symptoms related to the various complications of insulin resistance.  DIAGNOSIS  Several different things can make your caregiver suspect you have insulin resistance. These include:  High blood glucose (hyperglycemia).  Abnormal cholesterol levels.  High uric acid levels.  Changes related to blood pressure.  Changes related to inflammation. Insulin resistance can be determined with blood tests. An elevated insulin level when you have not eaten might suggest resistance. Other more complicated tests are sometimes necessary. TREATMENT  Lifestyle changes are the most important treatment for insulin resistance.   If you are overweight and you have insulin resistance, you can improve your insulin sensitivity by losing weight.  Moderate exercise for 30 40 minutes, 4 days a week, can improve insulin sensitivity. Some medicines can also help improve your insulin sensitivity. Your caregiver can  discuss these with you if they are appropriate.  HOME CARE INSTRUCTIONS   Do not smoke.  Keep your weight at a healthy level.  Get exercise.  If you have diabetes, follow your caregiver's directions.  If you have high blood pressure, follow your caregiver's directions.  Only take prescription medicines for pain, fever, or discomfort as directed by your caregiver. SEEK MEDICAL CARE IF:   You are diabetic and you are having problems keeping your blood glucose levels at target range.  You are having episodes of low blood glucose (hypoglycemia).  You feel you might be having side effects from your medicines.  You have symptoms of an illness that is not improving after 3 4 days.  You have a sore or wound that is not healing.  You notice a change in vision or a new problem with your vision. SEEK IMMEDIATE MEDICAL CARE IF:   Your blood glucose goes below 70, especially if you have confusion, lightheadedness, or other symptoms with it.  Your blood glucose is very high (as advised by your caregiver) twice in a row.  You pass out.  You have chest pain or trouble breathing.  You have a sudden, severe headache.  You have sudden weakness in one arm or one leg.  You have sudden difficulty speaking or swallowing.  You develop vomiting or diarrhea that is getting worse or not improving after 1 day. Document Released: 06/28/2005 Document Revised: 11/08/2011 Document Reviewed: 10/18/2012 [facility] Patient Information 2014 [place], [place].   Hypertension As your heart beats, it forces blood through your arteries. This force is your blood pressure. If the pressure is too high, it is called hypertension (HTN) or high blood pressure. HTN is dangerous because you may  have it and not know it. High blood pressure may mean that your heart has to work harder to pump blood. Your arteries may be narrow or stiff. The extra work puts you at risk for heart disease, stroke, and other problems.  Blood  pressure consists of two numbers, a higher number over a lower, 110/72, for example. It is stated as "110 over 72." The ideal is below 120 for the top number (systolic) and under 80 for the bottom (diastolic). Write down your blood pressure today. You should pay close attention to your blood pressure if you have certain conditions such as:  Heart failure.  Prior heart attack.  Diabetes  Chronic kidney disease.  Prior stroke.  Multiple risk factors for heart disease. To see if you have HTN, your blood pressure should be measured while you are seated with your arm held at the level of the heart. It should be measured at least twice. A one-time elevated blood pressure reading (especially in the Emergency Department) does not mean that you need treatment. There may be conditions in which the blood pressure is different between your right and left arms. It is important to see your caregiver soon for a recheck. Most people have essential hypertension which means that there is not a specific cause. This type of high blood pressure may be lowered by changing lifestyle factors such as:  Stress.  Smoking.  Lack of exercise.  Excessive weight.  Drug/tobacco/alcohol use.  Eating less salt. Most people do not have symptoms from high blood pressure until it has caused damage to the body. Effective treatment can often prevent, delay or reduce that damage. TREATMENT  When a cause has been identified, treatment for high blood pressure is directed at the cause. There are a large number of medications to treat HTN. These fall into several categories, and your caregiver will help you select the medicines that are best for you. Medications may have side effects. You should review side effects with your caregiver. If your blood pressure stays high after you have made lifestyle changes or started on medicines,   Your medication(s) may need to be changed.  Other problems may need to be addressed.  Be  certain you understand your prescriptions, and know how and when to take your medicine.  Be sure to follow up with your caregiver within the time frame advised (usually within two weeks) to have your blood pressure rechecked and to review your medications.  If you are taking more than one medicine to lower your blood pressure, make sure you know how and at what times they should be taken. Taking two medicines at the same time can result in blood pressure that is too low. SEEK IMMEDIATE MEDICAL CARE IF:  You develop a severe headache, blurred or changing vision, or confusion.  You have unusual weakness or numbness, or a faint feeling.  You have severe chest or abdominal pain, vomiting, or breathing problems. MAKE SURE YOU:   Understand these instructions.  Will watch your condition.  Will get help right away if you are not doing well or get worse. Document Released: 05/09/2005 Document Revised: 08/01/2011 Document Reviewed: 12/28/2007 [facility] Patient Information 2014 [place], [place]. Diabetes and Exercise Exercising regularly is important. It is not just about losing weight. It has many health benefits, such as:  Improving your overall fitness, flexibility, and endurance.  Increasing your bone density.  Helping with weight control.  Decreasing your body fat.  Increasing your muscle strength.  Reducing stress and  tension.  Improving your overall health. People with diabetes who exercise gain additional benefits because exercise:  Reduces appetite.  Improves the body's use of blood sugar (glucose).  Helps lower or control blood glucose.  Decreases blood pressure.  Helps control blood lipids (such as cholesterol and triglycerides).  Improves the body's use of the hormone insulin by:  Increasing the body's insulin sensitivity.  Reducing the body's insulin needs.  Decreases the risk for heart disease because exercising:  Lowers cholesterol and triglycerides  levels.  Increases the levels of good cholesterol (such as high-density lipoproteins [HDL]) in the body.  Lowers blood glucose levels. YOUR ACTIVITY PLAN  Choose an activity that you enjoy and set realistic goals. Your health care provider or diabetes educator can help you make an activity plan that works for you. You can break activities into 2 or 3 sessions throughout the day. Doing so is as good as one long session. Exercise ideas include:  Taking the dog for a walk.  Taking the stairs instead of the elevator.  Dancing to your favorite song.  Doing your favorite exercise with a friend. RECOMMENDATIONS FOR EXERCISING WITH TYPE 1 OR TYPE 2 DIABETES   Check your blood glucose before exercising. If blood glucose levels are greater than 240 mg/dL, check for urine ketones. Do not exercise if ketones are present.  Avoid injecting insulin into areas of the body that are going to be exercised. For example, avoid injecting insulin into:  The arms when playing tennis.  The legs when jogging.  Keep a record of:  Food intake before and after you exercise.  Expected peak times of insulin action.  Blood glucose levels before and after you exercise.  The type and amount of exercise you have done.  Review your records with your health care provider. Your health care provider will help you to develop guidelines for adjusting food intake and insulin amounts before and after exercising.  If you take insulin or oral hypoglycemic agents, watch for signs and symptoms of hypoglycemia. They include:  Dizziness.  Shaking.  Sweating.  Chills.  Confusion.  Drink plenty of water while you exercise to prevent dehydration or heat stroke. Body water is lost during exercise and must be replaced.  Talk to your health care provider before starting an exercise program to make sure it is safe for you. Remember, almost any type of activity is better than none. Document Released: 07/30/2003 Document  Revised: 01/09/2013 Document Reviewed: 10/16/2012 [facility] Patient Information 2014 [place], [place]. Cholesterol Cholesterol is a white, waxy, fat-like protein needed by your body in small amounts. The liver makes all the cholesterol you need. It is carried from the liver by the blood through the blood vessels. Deposits (plaque) may build up on blood vessel walls. This makes the arteries narrower and stiffer. Plaque increases the risk for heart attack and stroke. You cannot feel your cholesterol level even if it is very high. The only way to know is by a blood test to check your lipid (fats) levels. Once you know your cholesterol levels, you should keep a record of the test results. Work with your caregiver to to keep your levels in the desired range. WHAT THE RESULTS MEAN:  Total cholesterol is a rough measure of all the cholesterol in your blood.  LDL is the so-called bad cholesterol. This is the type that deposits cholesterol in the walls of the arteries. You want this level to be low.  HDL is the good cholesterol because it cleans  the arteries and carries the LDL away. You want this level to be high.  Triglycerides are fat that the body can either burn for energy or store. High levels are closely linked to heart disease. DESIRED LEVELS:  Total cholesterol below 200.  LDL below 100 for people at risk, below 70 for very high risk.  HDL above 50 is good, above 60 is best.  Triglycerides below 150. HOW TO LOWER YOUR CHOLESTEROL:  Diet.  Choose fish or white meat chicken and [place], roasted or baked. Limit fatty cuts of red meat, fried foods, and processed meats, such as sausage and lunch meat.  Eat lots of fresh fruits and vegetables. Choose whole grains, beans, pasta, potatoes and cereals.  Use only small amounts of olive, corn or canola oils. Avoid butter, mayonnaise, shortening or palm kernel oils. Avoid foods with trans-fats.  Use skim/nonfat milk and low-fat/nonfat yogurt and  cheeses. Avoid whole milk, cream, ice cream, egg yolks and cheeses. Healthy desserts include angel food cake, ginger snaps, animal crackers, hard candy, popsicles, and low-fat/nonfat frozen yogurt. Avoid pastries, cakes, pies and cookies.  Exercise.  A regular program helps decrease LDL and raises HDL.  Helps with weight control.  Do things that increase your activity level like gardening, walking, or taking the stairs.  Medication.  May be prescribed by your caregiver to help lowering cholesterol and the risk for heart disease.  You may need medicine even if your levels are normal if you have several risk factors. HOME CARE INSTRUCTIONS   Follow your diet and exercise programs as suggested by your caregiver.  Take medications as directed.  Have blood work done when your caregiver feels it is necessary. MAKE SURE YOU:   Understand these instructions.  Will watch your condition.  Will get help right away if you are not doing well or get worse. Document Released: 02/01/2001 Document Revised: 08/01/2011 Document Reviewed: 07/25/2007 [facility] Patient Information 2014 [place], [place]. Vitamin D Deficiency Vitamin D is an important vitamin that your body needs. Having too little of it in your body is called a deficiency. A very bad deficiency can make your bones soft and can cause a condition called rickets.  Vitamin D is important to your body for different reasons, such as:   It helps your body absorb 2 minerals called calcium and phosphorus.  It helps make your bones healthy.  It may prevent some diseases, such as diabetes and multiple sclerosis.  It helps your muscles and heart. You can get vitamin D in several ways. It is a natural part of some foods. The vitamin is also added to some dairy products and cereals. Some people take vitamin D supplements. Also, your body makes vitamin D when you are in the sun. It changes the sun's rays into a form of the vitamin that your body  can use. CAUSES   Not eating enough foods that contain vitamin D.  Not getting enough sunlight.  Having certain digestive system diseases that make it hard to absorb vitamin D. These diseases include Crohn's disease, chronic pancreatitis, and cystic fibrosis.  Having a surgery in which part of the stomach or small intestine is removed.  Being obese. Fat cells pull vitamin D out of your blood. That means that obese people may not have enough vitamin D left in their blood and in other body tissues.  Having chronic kidney or liver disease. RISK FACTORS Risk factors are things that make you more likely to develop a vitamin D deficiency. They  include:  Being older.  Not being able to get outside very much.  Living in a nursing home.  Having had broken bones.  Having weak or thin bones (osteoporosis).  Having a disease or condition that changes how your body absorbs vitamin D.  Having dark skin.  Some medicines such as seizure medicines or steroids.  Being overweight or obese. SYMPTOMS Mild cases of vitamin D deficiency may not have any symptoms. If you have a very bad case, symptoms may include:  Bone pain.  Muscle pain.  Falling often.  Broken bones caused by a minor injury, due to osteoporosis. DIAGNOSIS A blood test is the best way to tell if you have a vitamin D deficiency. TREATMENT Vitamin D deficiency can be treated in different ways. Treatment for vitamin D deficiency depends on what is causing it. Options include:  Taking vitamin D supplements.  Taking a calcium supplement. Your caregiver will suggest what dose is best for you. HOME CARE INSTRUCTIONS  Take any supplements that your caregiver prescribes. Follow the directions carefully. Take only the suggested amount.  Have your blood tested 2 months after you start taking supplements.  Eat foods that contain vitamin D. Healthy choices include:  Fortified dairy products, cereals, or juices. Fortified  means vitamin D has been added to the food. Check the label on the package to be sure.  Fatty fish like salmon or trout.  Eggs.  Oysters.  Do not use a tanning bed.  Keep your weight at a healthy level. Lose weight if you need to.  Keep all follow-up appointments. Your caregiver will need to perform blood tests to make sure your vitamin D deficiency is going away. SEEK MEDICAL CARE IF:  You have any questions about your treatment.  You continue to have symptoms of vitamin D deficiency.  You have nausea or vomiting.  You are constipated.  You feel confused.  You have severe abdominal or back pain. MAKE SURE YOU:  Understand these instructions.  Will watch your condition.  Will get help right away if you are not doing well or get worse. Document Released: 08/01/2011 Document Revised: 09/03/2012 Document Reviewed: 08/01/2011 [facility] Patient Information 2014 [place], [place].

## 2013-05-01 ENCOUNTER — Other Ambulatory Visit: Payer: Self-pay | Admitting: Physician Assistant

## 2013-05-31 ENCOUNTER — Other Ambulatory Visit: Payer: Self-pay | Admitting: Physician Assistant

## 2013-05-31 NOTE — Telephone Encounter (Signed)
Called refill in to pharmacy    

## 2013-06-13 ENCOUNTER — Other Ambulatory Visit: Payer: Self-pay | Admitting: Internal Medicine

## 2013-06-19 ENCOUNTER — Ambulatory Visit: Payer: Self-pay | Admitting: Emergency Medicine

## 2013-07-01 ENCOUNTER — Other Ambulatory Visit: Payer: Self-pay | Admitting: Physician Assistant

## 2013-07-01 MED ORDER — ALPRAZOLAM 1 MG PO TABS: ORAL_TABLET | ORAL | Status: DC

## 2013-07-26 ENCOUNTER — Other Ambulatory Visit: Payer: Self-pay | Admitting: Physician Assistant

## 2013-07-26 MED ORDER — ALPRAZOLAM 1 MG PO TABS: ORAL_TABLET | ORAL | Status: DC

## 2013-07-31 ENCOUNTER — Ambulatory Visit: Payer: Self-pay | Admitting: Physician Assistant

## 2013-08-20 ENCOUNTER — Other Ambulatory Visit: Payer: Self-pay | Admitting: *Deleted

## 2013-08-20 MED ORDER — LISINOPRIL-HYDROCHLOROTHIAZIDE 20-25 MG PO TABS: 1.0000 | ORAL_TABLET | Freq: Every day | ORAL | Status: DC

## 2013-08-27 ENCOUNTER — Other Ambulatory Visit: Payer: Self-pay | Admitting: Physician Assistant

## 2013-08-27 MED ORDER — ALPRAZOLAM 1 MG PO TABS: ORAL_TABLET | ORAL | Status: DC

## 2013-09-25 ENCOUNTER — Other Ambulatory Visit: Payer: Self-pay | Admitting: Physician Assistant

## 2013-09-25 MED ORDER — ALPRAZOLAM 1 MG PO TABS: ORAL_TABLET | ORAL | Status: DC

## 2013-10-24 ENCOUNTER — Ambulatory Visit: Payer: Self-pay | Admitting: Internal Medicine

## 2013-10-28 ENCOUNTER — Other Ambulatory Visit: Payer: Self-pay | Admitting: Physician Assistant

## 2013-10-28 MED ORDER — ALPRAZOLAM 1 MG PO TABS: ORAL_TABLET | ORAL | Status: DC

## 2013-11-25 ENCOUNTER — Other Ambulatory Visit: Payer: Self-pay | Admitting: Physician Assistant

## 2013-11-25 MED ORDER — ALPRAZOLAM 1 MG PO TABS: ORAL_TABLET | ORAL | Status: DC

## 2013-11-26 ENCOUNTER — Other Ambulatory Visit: Payer: Self-pay | Admitting: Physician Assistant

## 2013-11-26 ENCOUNTER — Other Ambulatory Visit: Payer: Self-pay | Admitting: Internal Medicine

## 2013-11-27 ENCOUNTER — Other Ambulatory Visit: Payer: Self-pay | Admitting: Internal Medicine

## 2013-12-03 ENCOUNTER — Other Ambulatory Visit: Payer: Self-pay | Admitting: Internal Medicine

## 2013-12-03 ENCOUNTER — Ambulatory Visit (INDEPENDENT_AMBULATORY_CARE_PROVIDER_SITE_OTHER): Payer: Self-pay | Admitting: Internal Medicine

## 2013-12-03 ENCOUNTER — Encounter: Payer: Self-pay | Admitting: Internal Medicine

## 2013-12-03 VITALS — BP 118/76 | HR 96 | Temp 98.4°F | Resp 16 | Ht 72.0 in | Wt 234.4 lb

## 2013-12-03 DIAGNOSIS — E559 Vitamin D deficiency, unspecified: Secondary | ICD-10-CM

## 2013-12-03 DIAGNOSIS — R7309 Other abnormal glucose: Secondary | ICD-10-CM

## 2013-12-03 DIAGNOSIS — Z79899 Other long term (current) drug therapy: Secondary | ICD-10-CM | POA: Insufficient documentation

## 2013-12-03 DIAGNOSIS — I10 Essential (primary) hypertension: Secondary | ICD-10-CM

## 2013-12-03 DIAGNOSIS — E782 Mixed hyperlipidemia: Secondary | ICD-10-CM

## 2013-12-03 MED ORDER — METFORMIN HCL ER 500 MG PO TB24: 500.0000 mg | ORAL_TABLET | Freq: Every day | ORAL | Status: DC

## 2013-12-03 NOTE — Progress Notes (Signed)
Patient ID: Howard Fox, male   DOB: [birth year], 53 y.o.   MRN: [id number]   This very nice 53 y.o.male presents for 3 month follow up with Hypertension, Hyperlipidemia, Pre-Diabetes and Vitamin D Deficiency.    HTN predates since   . BP has been controlled at home. Today's BP: 118/76 mmHg. Patient denies any cardiac type chest pain, palpitations, dyspnea/orthopnea/PND, dizziness, claudication, or dependent edema.   Hyperlipidemia is controlled with diet & meds. Patient denies myalgias or other med SE's. Last Lipids were  Chol 228, TG 90, HDL 60 and elevated LDL 150 in Aug 2014.    Also, the patient has  PreDiabetes/Insulin Resistance and last A1c was 5.2% with an elevated insulin of 46 showing insulin resistance in Aug 2014 and was started on Metformin for insulin resistance. Patient denies any symptoms of reactive hypoglycemia, diabetic polys, paresthesias or visual blurring.   Further, Patient has history of Vitamin D Deficiency  and last vitamin D was 42 in Aug 2014.  Patient supplements vitamin D without any suspected side-effects.   Medication List   ALPRAZolam 1 MG tablet  Commonly known as:  XANAX  TAKE ONE TABLET BY MOUTH THREE TIMES DAILY AS NEEDED     amitriptyline 25 MG tablet  Commonly known as:  ELAVIL  TAKE ONE TABLET BY MOUTH 4 TIMES DAILY FOR MOOD     citalopram 40 MG tablet  Commonly known as:  CELEXA  TAKE ONE TABLET BY MOUTH EVERY DAY FOR  MOOD     ibuprofen 200 MG tablet  Commonly known as:  ADVIL,MOTRIN  Take 800 mg by mouth every 6 (six) hours as needed for pain.     lamoTRIgine 200 MG tablet  Commonly known as:  LAMICTAL  TAKE ONE TABLET BY MOUTH EVERY DAY FOR  MOOD     lisinopril-hydrochlorothiazide 20-25 MG per tablet  Commonly known as:  PRINZIDE,ZESTORETIC  Take 1 tablet by mouth daily.     metFORMIN 500 MG 24 hr tablet  Commonly known as:  GLUCOPHAGE-XR  Take 1 tablet (500 mg total) by mouth daily with breakfast.     pravastatin 40 MG tablet   Commonly known as:  PRAVACHOL  Take 40 mg by mouth daily.     Allergies  Allergen Reactions  . Codeine Itching   PMHx:   Past Medical History  Diagnosis Date  . Kidney stones   . Bipolar 1 disorder   . Hepatitis C   . Hypertension    FHx:    Reviewed / unchanged  SHx:    Reviewed / unchanged  Systems Review:  Constitutional: Denies fever, chills, wt changes, headaches, insomnia, fatigue, night sweats, change in appetite. Eyes: Denies redness, blurred vision, diplopia, discharge, itchy, watery eyes.  ENT: Denies discharge, congestion, post nasal drip, epistaxis, sore throat, earache, hearing loss, dental pain, tinnitus, vertigo, sinus pain, snoring.  CV: Denies chest pain, palpitations, irregular heartbeat, syncope, dyspnea, diaphoresis, orthopnea, PND, claudication or edema. Respiratory: denies cough, dyspnea, DOE, pleurisy, hoarseness, laryngitis, wheezing.  Gastrointestinal: Denies dysphagia, odynophagia, heartburn, reflux, water brash, abdominal pain or cramps, nausea, vomiting, bloating, diarrhea, constipation, hematemesis, melena, hematochezia  or hemorrhoids. Genitourinary: Denies dysuria, frequency, urgency, nocturia, hesitancy, discharge, hematuria or flank pain. Musculoskeletal: Denies arthralgias, myalgias, stiffness, jt. swelling, pain, limping or strain/sprain.  Skin: Denies pruritus, rash, hives, warts, acne, eczema or change in skin lesion(s). Neuro: No weakness, tremor, incoordination, spasms, paresthesia or pain. Psychiatric: Denies confusion, memory loss or sensory loss. Endo: Denies change in weight, skin  or hair change.  Heme/Lymph: No excessive bleeding, bruising or enlarged lymph nodes.  Exam:  BP 118/76  Pulse 96  Temp(Src) 98.4 F (36.9 C) (Temporal)  Resp 16  Ht 6' (1.829 m)  Wt 234 lb 6.4 oz (106.323 kg)  BMI 31.78 kg/m2  Appears well nourished and in no distress. Eyes: PERRLA, EOMs, conjunctiva no swelling or erythema. Sinuses: No  frontal/maxillary tenderness ENT/Mouth: EAC's clear, TM's nl w/o erythema, bulging. Nares clear w/o erythema, swelling, exudates. Oropharynx clear without erythema or exudates. Oral hygiene is good. Tongue normal, non obstructing. Hearing intact.  Neck: Supple. Thyroid nl. Car 2+/2+ without bruits, nodes or JVD. Chest: Respirations nl with BS clear & equal w/o rales, rhonchi, wheezing or stridor.  Cor: Heart sounds normal w/ regular rate and rhythm without sig. murmurs, gallops, clicks, or rubs. Peripheral pulses normal and equal  without edema.  Abdomen: Soft & bowel sounds normal. Non-tender w/o guarding, rebound, hernias, masses, or organomegaly.  Lymphatics: Unremarkable.  Musculoskeletal: Full ROM all peripheral extremities, joint stability, 5/5 strength, and normal gait.  Skin: Warm, dry without exposed rashes, lesions or ecchymosis apparent.  Neuro: Cranial nerves intact, reflexes equal bilaterally. Sensory-motor testing grossly intact. Tendon reflexes grossly intact.  Pysch: Alert & oriented x 3. Insight and judgement nl & appropriate. No ideations.  Assessment and Plan:  1. Hypertension - Continue monitor blood pressure at home. Continue diet/meds same.  2. Hyperlipidemia - Continue diet/meds, exercise,& lifestyle modifications. Continue monitor periodic cholesterol/liver & renal functions   3. Pre-Diabetes - Continue diet, exercise, lifestyle modifications. Monitor appropriate labs.  4. Vitamin D Deficiency - Continue supplementation.  Recommended regular exercise, BP monitoring, weight control, and discussed med and SE's. Patient has been out of [place]ok for an extended time and now has disability, but not on medicare yet, so labs deferred.

## 2013-12-03 NOTE — Patient Instructions (Signed)

## 2013-12-04 ENCOUNTER — Other Ambulatory Visit: Payer: Self-pay | Admitting: Physician Assistant

## 2014-01-02 ENCOUNTER — Ambulatory Visit: Payer: Self-pay | Admitting: Internal Medicine

## 2014-02-03 ENCOUNTER — Other Ambulatory Visit: Payer: Self-pay | Admitting: Emergency Medicine

## 2014-02-03 ENCOUNTER — Other Ambulatory Visit: Payer: Self-pay | Admitting: Internal Medicine

## 2014-03-12 ENCOUNTER — Other Ambulatory Visit: Payer: Self-pay | Admitting: Physician Assistant

## 2014-03-12 DIAGNOSIS — S86912A Strain of unspecified muscle(s) and tendon(s) at lower leg level, left leg, initial encounter: Secondary | ICD-10-CM | POA: Diagnosis not present

## 2014-03-12 DIAGNOSIS — W1831XA Fall on same level due to stepping on an object, initial encounter: Secondary | ICD-10-CM | POA: Diagnosis not present

## 2014-04-01 ENCOUNTER — Other Ambulatory Visit: Payer: Self-pay | Admitting: Internal Medicine

## 2014-04-16 ENCOUNTER — Encounter: Payer: Self-pay | Admitting: Internal Medicine

## 2014-05-22 ENCOUNTER — Encounter: Payer: Self-pay | Admitting: Internal Medicine

## 2014-06-02 ENCOUNTER — Other Ambulatory Visit: Payer: Self-pay | Admitting: Internal Medicine

## 2014-07-02 ENCOUNTER — Other Ambulatory Visit: Payer: Self-pay | Admitting: Internal Medicine

## 2014-07-03 ENCOUNTER — Other Ambulatory Visit: Payer: Self-pay | Admitting: Internal Medicine

## 2014-07-04 ENCOUNTER — Other Ambulatory Visit: Payer: Self-pay | Admitting: Internal Medicine

## 2014-07-05 ENCOUNTER — Other Ambulatory Visit: Payer: Self-pay | Admitting: Internal Medicine

## 2014-07-11 ENCOUNTER — Ambulatory Visit: Payer: Medicare Other | Admitting: Internal Medicine

## 2014-07-11 ENCOUNTER — Encounter: Payer: Self-pay | Admitting: Internal Medicine

## 2014-07-11 VITALS — BP 136/80 | HR 88 | Temp 98.0°F | Resp 18 | Ht 72.0 in | Wt 237.0 lb

## 2014-07-11 DIAGNOSIS — E559 Vitamin D deficiency, unspecified: Secondary | ICD-10-CM

## 2014-07-11 DIAGNOSIS — R7303 Prediabetes: Secondary | ICD-10-CM

## 2014-07-11 DIAGNOSIS — Z79899 Other long term (current) drug therapy: Secondary | ICD-10-CM

## 2014-07-11 DIAGNOSIS — I10 Essential (primary) hypertension: Secondary | ICD-10-CM

## 2014-07-11 DIAGNOSIS — E782 Mixed hyperlipidemia: Secondary | ICD-10-CM

## 2014-07-12 ENCOUNTER — Encounter: Payer: Self-pay | Admitting: Internal Medicine

## 2014-07-12 NOTE — Progress Notes (Signed)
Patient ID: Howard Fox, male   DOB: [birth year], 54 y.o.   MRN: [id number]   This very nice 54 y.o. SWM presents for  follow up with Hypertension, Hyperlipidemia, Pre-Diabetes and Vitamin D Deficiency.    Patient is treated for HTN & BP has been controlled at home. Today's BP: 136/80 mmHg. Patient has had no complaints of any cardiac type chest pain, palpitations, dyspnea/orthopnea/PND, dizziness, claudication, or dependent edema.   Hyperlipidemia is controlled with diet & meds. Patient denies myalgias or other med SE's. Last Lipids were  TC 228, TG 90, HDL 60, & LDL 150 in Aug 2014 & patient was started on Pravastatin.    Also, the patient has history of PreDiabetes / T2_NIDDM and has had no symptoms of reactive hypoglycemia, diabetic polys, paresthesias or visual blurring. Last A1c was 5.2 with elevated insulin level 46 in Aug 2014.    Further, the patient also has history of Vitamin D Deficiency and supplements vitamin D without any suspected side-effects. Last vitamin D was 42 in Aug 2014.   Medication Sig  . ALPRAZolam  1 MG tablet TAKE ONE TABLET BY MOUTH THREE TIMES DAILY AS NEEDED  . amitriptyline  25 MG tablet TAKE ONE TABLET BY MOUTH 4 TIMES DAILY FOR MOOD  . citalopram  40 MG tablet TAKE ONE TABLET BY MOUTH ONCE DAILY FOR  MOOD  . ibuprofen  200 MG tablet Take 800 mg by mouth every 6 (six) hours as needed for pain.  Marland Kitchen lamoTRIgine (LAMICTAL) 200 MG tablet TAKE ONE TABLET BY MOUTH EVERY DAY FOR  MOOD  . lisinopril-hctz 20-25 MG per tablet TAKE ONE TABLET BY MOUTH ONCE DAILY  . metFORMIN  500 MG tablet TAKE ONE TABLET BY MOUTH ONCE DAILY  . pravastatin 40 MG tablet Take 40 mg by mouth daily.  . metFORMIN -XR 500 MG 24 hr tab Take 1 tab  daily with breakfast. - Patient not taking   Allergies  Allergen Reactions  . Codeine Itching   PMHx:   Past Medical History  Diagnosis Date  . Kidney stones   . Bipolar 1 disorder   . Hepatitis C   . Hypertension    Immunization History   Administered Date(s) Administered  . Td 05/23/2004   Past Surgical History  Procedure Laterality Date  . Cholecystectomy     FHx:    Reviewed / unchanged  SHx:    Reviewed / unchanged  Systems Review:  Constitutional: Denies fever, chills, wt changes, headaches, insomnia, fatigue, night sweats, change in appetite. Eyes: Denies redness, blurred vision, diplopia, discharge, itchy, watery eyes.  ENT: Denies discharge, congestion, post nasal drip, epistaxis, sore throat, earache, hearing loss, dental pain, tinnitus, vertigo, sinus pain, snoring.  CV: Denies chest pain, palpitations, irregular heartbeat, syncope, dyspnea, diaphoresis, orthopnea, PND, claudication or edema. Respiratory: denies cough, dyspnea, DOE, pleurisy, hoarseness, laryngitis, wheezing.  Gastrointestinal: Denies dysphagia, odynophagia, heartburn, reflux, water brash, abdominal pain or cramps, nausea, vomiting, bloating, diarrhea, constipation, hematemesis, melena, hematochezia  or hemorrhoids. Genitourinary: Denies dysuria, frequency, urgency, nocturia, hesitancy, discharge, hematuria or flank pain. Musculoskeletal: Denies arthralgias, myalgias, stiffness, jt. swelling, pain, limping or strain/sprain.  Skin: Denies pruritus, rash, hives, warts, acne, eczema or change in skin lesion(s). Neuro: No weakness, tremor, incoordination, spasms, paresthesia or pain. Psychiatric: Denies confusion, memory loss or sensory loss. Endo: Denies change in weight, skin or hair change.  Heme/Lymph: No excessive bleeding, bruising or enlarged lymph nodes.  Physical Exam  BP 136/80   Pulse 88  Temp 98  F   Resp 18  Ht 6'   Wt 237 lb     BMI 32.14   Appears well nourished and in no distress. Eyes: PERRLA, EOMs, conjunctiva no swelling or erythema. Sinuses: No frontal/maxillary tenderness ENT/Mouth: EAC's clear, TM's nl w/o erythema, bulging. Nares clear w/o erythema, swelling, exudates. Oropharynx clear without erythema or exudates.  Oral hygiene is good. Tongue normal, non obstructing. Hearing intact.  Neck: Supple. Thyroid nl. Car 2+/2+ without bruits, nodes or JVD. Chest: Respirations nl with BS clear & equal w/o rales, rhonchi, wheezing or stridor.  Cor: Heart sounds normal w/ regular rate and rhythm without sig. murmurs, gallops, clicks, or rubs. Peripheral pulses normal and equal  without edema.  Abdomen: Soft & bowel sounds normal. Non-tender w/o guarding, rebound, hernias, masses, or organomegaly.  Lymphatics: Unremarkable.  Musculoskeletal: Full ROM all peripheral extremities, joint stability, 5/5 strength, and normal gait.  Skin: Warm, dry without exposed rashes, lesions or ecchymosis apparent.  Neuro: Cranial nerves intact, reflexes equal bilaterally. Sensory-motor testing grossly intact. Tendon reflexes grossly intact.  Pysch: Alert & oriented x 3.  Insight and judgement nl & appropriate. No ideations.  Assessment and Plan:  1. Hypertension - Continue monitor blood pressure at home. Continue diet/meds same.  2. Hyperlipidemia - Continue diet/meds, exercise,& lifestyle modifications. Continue monitor periodic cholesterol/liver & renal functions   3. T2_NIDDM / Pre-Diabetes - Continue diet, exercise, lifestyle modifications. Monitor appropriate labs.  4. Vitamin D Deficiency - Continue supplementation.   Recommended regular exercise, BP monitoring, weight control, and discussed med and SE's. Recommended labs to assess and monitor clinical status. Patient has [employer] pending and is deferring any labs til assured of coverage.

## 2014-07-12 NOTE — Patient Instructions (Signed)

## 2014-11-05 ENCOUNTER — Other Ambulatory Visit: Payer: Self-pay | Admitting: Internal Medicine

## 2014-11-25 ENCOUNTER — Encounter: Payer: Self-pay | Admitting: Internal Medicine

## 2014-12-04 ENCOUNTER — Other Ambulatory Visit: Payer: Self-pay | Admitting: Physician Assistant

## 2014-12-04 ENCOUNTER — Other Ambulatory Visit: Payer: Self-pay | Admitting: Internal Medicine

## 2014-12-04 DIAGNOSIS — F411 Generalized anxiety disorder: Secondary | ICD-10-CM

## 2014-12-05 ENCOUNTER — Other Ambulatory Visit: Payer: Self-pay | Admitting: Internal Medicine

## 2014-12-05 DIAGNOSIS — F419 Anxiety disorder, unspecified: Secondary | ICD-10-CM

## 2014-12-05 NOTE — Telephone Encounter (Signed)
Called Rx into [facility]

## 2014-12-10 ENCOUNTER — Ambulatory Visit (INDEPENDENT_AMBULATORY_CARE_PROVIDER_SITE_OTHER): Payer: Medicare Other | Admitting: Internal Medicine

## 2014-12-10 ENCOUNTER — Encounter: Payer: Self-pay | Admitting: Internal Medicine

## 2014-12-10 VITALS — BP 120/82 | HR 76 | Temp 97.3°F | Resp 16 | Ht 72.0 in | Wt 243.2 lb

## 2014-12-10 DIAGNOSIS — Z79899 Other long term (current) drug therapy: Secondary | ICD-10-CM

## 2014-12-10 DIAGNOSIS — E559 Vitamin D deficiency, unspecified: Secondary | ICD-10-CM | POA: Diagnosis not present

## 2014-12-10 DIAGNOSIS — E782 Mixed hyperlipidemia: Secondary | ICD-10-CM

## 2014-12-10 DIAGNOSIS — I10 Essential (primary) hypertension: Secondary | ICD-10-CM | POA: Diagnosis not present

## 2014-12-10 DIAGNOSIS — E119 Type 2 diabetes mellitus without complications: Secondary | ICD-10-CM

## 2014-12-10 DIAGNOSIS — F319 Bipolar disorder, unspecified: Secondary | ICD-10-CM

## 2014-12-10 DIAGNOSIS — R7309 Other abnormal glucose: Secondary | ICD-10-CM | POA: Insufficient documentation

## 2014-12-10 DIAGNOSIS — E669 Obesity, unspecified: Secondary | ICD-10-CM | POA: Insufficient documentation

## 2014-12-10 LAB — LIPID PANEL
CHOLESTEROL: 197 mg/dL (ref 0–200)
HDL: 51 mg/dL (ref >=40)
LDL CALC: 127 mg/dL — AB (ref 0–99)
Total CHOL/HDL Ratio: 3.9 Ratio
Triglycerides: 97 mg/dL (ref <150)
VLDL: 19 mg/dL (ref 0–40)

## 2014-12-10 LAB — CBC WITH DIFFERENTIAL/PLATELET
BASOS PCT: 1 % (ref 0–1)
Basophils Absolute: 0.1 10*3/uL (ref 0.0–0.1)
EOS PCT: 3 % (ref 0–5)
Eosinophils Absolute: 0.2 10*3/uL (ref 0.0–0.7)
HEMATOCRIT: 45.9 % (ref 39.0–52.0)
HEMOGLOBIN: 15.8 g/dL (ref 13.0–17.0)
LYMPHS PCT: 31 % (ref 12–46)
Lymphs Abs: 1.9 10*3/uL (ref 0.7–4.0)
MCH: 31.9 pg (ref 26.0–34.0)
MCHC: 34.4 g/dL (ref 30.0–36.0)
MCV: 92.7 fL (ref 78.0–100.0)
MONO ABS: 0.5 10*3/uL (ref 0.1–1.0)
MPV: 10 fL (ref 8.6–12.4)
Monocytes Relative: 8 % (ref 3–12)
Neutro Abs: 3.5 10*3/uL (ref 1.7–7.7)
Neutrophils Relative %: 57 % (ref 43–77)
Platelets: 221 10*3/uL (ref 150–400)
RBC: 4.95 MIL/uL (ref 4.22–5.81)
RDW: 13.3 % (ref 11.5–15.5)
WBC: 6.1 10*3/uL (ref 4.0–10.5)

## 2014-12-10 LAB — BASIC METABOLIC PANEL WITH GFR
BUN: 11 mg/dL (ref 6–23)
CALCIUM: 9.7 mg/dL (ref 8.4–10.5)
CHLORIDE: 102 meq/L (ref 96–112)
CO2: 25 meq/L (ref 19–32)
CREATININE: 0.83 mg/dL (ref 0.50–1.35)
GFR, Est African American: >89 mL/min
GLUCOSE: 92 mg/dL (ref 70–99)
Potassium: 4.7 mEq/L (ref 3.5–5.3)
SODIUM: 139 meq/L (ref 135–145)

## 2014-12-10 LAB — HEPATIC FUNCTION PANEL
ALT: 23 U/L (ref 0–53)
AST: 22 U/L (ref 0–37)
Albumin: 3.7 g/dL (ref 3.5–5.2)
Alkaline Phosphatase: 60 U/L (ref 39–117)
BILIRUBIN TOTAL: 0.3 mg/dL (ref 0.2–1.2)
Bilirubin, Direct: 0.1 mg/dL (ref 0.0–0.3)
Indirect Bilirubin: 0.2 mg/dL (ref 0.2–1.2)
TOTAL PROTEIN: 6.9 g/dL (ref 6.0–8.3)

## 2014-12-10 LAB — HEMOGLOBIN A1C
HEMOGLOBIN A1C: 5.6 % (ref <5.7)
Mean Plasma Glucose: 114 mg/dL (ref <117)

## 2014-12-10 LAB — TSH: TSH: 1.035 u[IU]/mL (ref 0.350–4.500)

## 2014-12-10 LAB — MAGNESIUM: Magnesium: 1.9 mg/dL (ref 1.5–2.5)

## 2014-12-10 NOTE — Patient Instructions (Signed)

## 2014-12-10 NOTE — Progress Notes (Signed)
Patient ID: Howard Fox, male   DOB: June 13, 1960, 54 y.o.   MRN: [id number]   This very nice 54 y.o.male presents for 3 month follow up with Hypertension, Hyperlipidemia, T2_NIDDM and Vitamin D Deficiency. Patient does have bipolar disorder and in 2010 was awarded SS disability for same .    Patient is treated for HTN & BP has been controlled at home. Today's BP: 120/82 mmHg. Patient has had no complaints of any cardiac type chest pain, palpitations, dyspnea/orthopnea/PND, dizziness, claudication, or dependent edema.   Hyperlipidemia is not controlled with diet. Patient denies myalgias or other med SE's. Last Lipids were not at goal: Lab Results  Component Value Date   CHOL 223* 09/07/2009   HDL 51 09/07/2009   LDLCALC 148* 09/07/2009   TRIG 121 09/07/2009   CHOLHDL 4.4 Ratio 09/07/2009    Also, the patient has history of T2_NIDDM/preDiabetes and in Aug 2014 he was started on Metformin for A1c of 5.2% and elevated Insulin 44 for suspect insulin resistance due to Morbid Obesity and in hopes of facilitating weight loss. Patient d/c'd his MF about 5 months ago and has not been monitoring his CBG's and he has had no symptoms of reactive hypoglycemia, diabetic polys, paresthesias or visual blurring.    Further, the patient also has history of Vitamin D Deficiency and supplements vitamin D without any suspected side-effects. Last vitamin D was 46 in 2014.   Medication Sig  . ALPRAZolam (XANAX) 1 MG tablet TAKE ONE-HALF TO ONE TABLET BY MOUTH THREE TIMES DAILY AS NEEDED FOR ANXIETY  . amitriptyline (ELAVIL) 25 MG tablet TAKE ONE TABLET BY MOUTH 4 TIMES DAILY FOR MOOD  . citalopram (CELEXA) 40 MG tablet TAKE ONE TABLET BY MOUTH ONCE DAILY FOR  MOOD  . ibuprofen (ADVIL,MOTRIN) 200 MG tablet Take 800 mg by mouth every 6 (six) hours as needed for pain.  Marland Kitchen lamoTRIgine (LAMICTAL) 200 MG tablet TAKE ONE TABLET BY MOUTH EVERY DAY FOR  MOOD  . lisinopril-hctz 20-25 MG  TAKE ONE TABLET BY MOUTH ONCE DAILY  .  metFORMIN  500 MG tablet TAKE ONE TABLET BY MOUTH ONCE DAILY  . pravastatin  40 MG tablet Take 40 mg by mouth daily.   Allergies  Allergen Reactions  . Codeine Itching   PMHx:   Past Medical History  Diagnosis Date  . Kidney stones   . Bipolar 1 disorder   . Hepatitis C   . Hypertension    Immunization History  Administered Date(s) Administered  . Td 05/23/2004   Past Surgical History  Procedure Laterality Date  . Cholecystectomy     FHx:    Reviewed / unchanged  SHx:    Reviewed / unchanged  Systems Review:  Constitutional: Denies fever, chills, wt changes, headaches, insomnia, fatigue, night sweats, change in appetite. Eyes: Denies redness, blurred vision, diplopia, discharge, itchy, watery eyes.  ENT: Denies discharge, congestion, post nasal drip, epistaxis, sore throat, earache, hearing loss, dental pain, tinnitus, vertigo, sinus pain, snoring.  CV: Denies chest pain, palpitations, irregular heartbeat, syncope, dyspnea, diaphoresis, orthopnea, PND, claudication or edema. Respiratory: denies cough, dyspnea, DOE, pleurisy, hoarseness, laryngitis, wheezing.  Gastrointestinal: Denies dysphagia, odynophagia, heartburn, reflux, water brash, abdominal pain or cramps, nausea, vomiting, bloating, diarrhea, constipation, hematemesis, melena, hematochezia  or hemorrhoids. Genitourinary: Denies dysuria, frequency, urgency, nocturia, hesitancy, discharge, hematuria or flank pain. Musculoskeletal: Denies arthralgias, myalgias, stiffness, jt. swelling, pain, limping or strain/sprain.  Skin: Denies pruritus, rash, hives, warts, acne, eczema or change in skin lesion(s). Neuro: No  weakness, tremor, incoordination, spasms, paresthesia or pain. Psychiatric: Denies confusion, memory loss or sensory loss. Endo: Denies change in weight, skin or hair change.  Heme/Lymph: No excessive bleeding, bruising or enlarged lymph nodes.  Physical Exam  BP 120/82  Pulse 76  Temp 97.3 F   Resp 16   Ht 6'   Wt 243 lb 3.2 oz     BMI 32.98   Appears well nourished and in no distress. Eyes: PERRLA, EOMs, conjunctiva no swelling or erythema. Sinuses: No frontal/maxillary tenderness ENT/Mouth: EAC's clear, TM's nl w/o erythema, bulging. Nares clear w/o erythema, swelling, exudates. Oropharynx clear without erythema or exudates. Oral hygiene is good. Tongue normal, non obstructing. Hearing intact.  Neck: Supple. Thyroid nl. Car 2+/2+ without bruits, nodes or JVD. Chest: Respirations nl with BS clear & equal w/o rales, rhonchi, wheezing or stridor.  Cor: Heart sounds normal w/ regular rate and rhythm without sig. murmurs, gallops, clicks, or rubs. Peripheral pulses normal and equal  without edema.  Abdomen: Soft & bowel sounds normal. Non-tender w/o guarding, rebound, hernias, masses, or organomegaly.  Lymphatics: Unremarkable.  Musculoskeletal: Full ROM all peripheral extremities, joint stability, 5/5 strength, and normal gait.  Skin: Warm, dry without exposed rashes, lesions or ecchymosis apparent.  Neuro: Cranial nerves intact, reflexes equal bilaterally. Sensory-motor testing grossly intact. Tendon reflexes grossly intact.  Pysch: Alert & oriented x 3.  Insight and judgement nl & appropriate. No ideations.  Assessment and Plan:  1. Essential hypertension  - TSH  2. Mixed hyperlipidemia  - Lipid panel  3. T2_NIDDM  - Microalbumin / creatinine urine ratio - Hemoglobin A1c - Insulin, random  4. Vitamin D deficiency  - Vit D  25 hydroxy   5. Bipolar affective disorder   6. Morbid obesity (BMI 32.14)   7. Medication management  - Urine Microscopic - CBC with Differential/Platelet - BASIC METABOLIC PANEL WITH GFR - Hepatic function panel - Magnesium   Recommended regular exercise, BP monitoring, weight control, and discussed med and SE's. Recommended labs to assess and monitor clinical status. Further disposition pending results of labs. Over 30 minutes of exam,  counseling, chart review was performed

## 2014-12-11 LAB — URINALYSIS, MICROSCOPIC ONLY
Bacteria, UA: NONE SEEN
CRYSTALS: NONE SEEN
Casts: NONE SEEN
SQUAMOUS EPITHELIAL / LPF: NONE SEEN

## 2014-12-11 LAB — MICROALBUMIN / CREATININE URINE RATIO
Creatinine, Urine: 146.1 mg/dL
MICROALB UR: 0.4 mg/dL (ref <2.0)
Microalb Creat Ratio: 2.7 mg/g (ref 0.0–30.0)

## 2014-12-11 LAB — INSULIN, RANDOM: Insulin: 19.4 u[IU]/mL (ref 2.0–19.6)

## 2014-12-11 LAB — VITAMIN D 25 HYDROXY (VIT D DEFICIENCY, FRACTURES): Vit D, 25-Hydroxy: 27 ng/mL — ABNORMAL LOW (ref 30–100)

## 2015-01-01 ENCOUNTER — Other Ambulatory Visit: Payer: Self-pay | Admitting: Internal Medicine

## 2015-01-01 NOTE — Telephone Encounter (Signed)
Rx called in to [facility]

## 2015-01-08 ENCOUNTER — Other Ambulatory Visit: Payer: Self-pay | Admitting: Internal Medicine

## 2015-01-27 DIAGNOSIS — N201 Calculus of ureter: Secondary | ICD-10-CM | POA: Diagnosis not present

## 2015-01-27 DIAGNOSIS — H2513 Age-related nuclear cataract, bilateral: Secondary | ICD-10-CM | POA: Diagnosis not present

## 2015-01-29 ENCOUNTER — Other Ambulatory Visit: Payer: Self-pay | Admitting: Internal Medicine

## 2015-01-30 ENCOUNTER — Other Ambulatory Visit: Payer: Self-pay | Admitting: Internal Medicine

## 2015-01-30 NOTE — Telephone Encounter (Signed)
Called Rx into [facility] 

## 2015-02-09 DIAGNOSIS — N200 Calculus of kidney: Secondary | ICD-10-CM | POA: Diagnosis not present

## 2015-02-09 DIAGNOSIS — R1032 Left lower quadrant pain: Secondary | ICD-10-CM | POA: Diagnosis not present

## 2015-02-13 ENCOUNTER — Other Ambulatory Visit: Payer: Self-pay | Admitting: Internal Medicine

## 2015-03-01 ENCOUNTER — Other Ambulatory Visit: Payer: Self-pay | Admitting: Internal Medicine

## 2015-03-02 ENCOUNTER — Other Ambulatory Visit: Payer: Self-pay

## 2015-03-02 ENCOUNTER — Other Ambulatory Visit: Payer: Self-pay | Admitting: Internal Medicine

## 2015-03-02 MED ORDER — CITALOPRAM HYDROBROMIDE 40 MG PO TABS: 40.0000 mg | ORAL_TABLET | Freq: Every day | ORAL | Status: DC

## 2015-03-19 ENCOUNTER — Other Ambulatory Visit: Payer: Self-pay | Admitting: Physician Assistant

## 2015-03-26 ENCOUNTER — Encounter: Payer: Self-pay | Admitting: Internal Medicine

## 2015-03-30 ENCOUNTER — Other Ambulatory Visit: Payer: Self-pay | Admitting: Physician Assistant

## 2015-03-30 DIAGNOSIS — F411 Generalized anxiety disorder: Secondary | ICD-10-CM

## 2015-03-31 ENCOUNTER — Other Ambulatory Visit: Payer: Self-pay | Admitting: *Deleted

## 2015-03-31 MED ORDER — PRAVASTATIN SODIUM 40 MG PO TABS: 40.0000 mg | ORAL_TABLET | Freq: Every day | ORAL | Status: DC

## 2015-04-01 ENCOUNTER — Encounter: Payer: Self-pay | Admitting: Physician Assistant

## 2015-04-01 ENCOUNTER — Ambulatory Visit (INDEPENDENT_AMBULATORY_CARE_PROVIDER_SITE_OTHER): Payer: Medicare Other | Admitting: Physician Assistant

## 2015-04-01 VITALS — BP 140/80 | HR 83 | Temp 98.1°F | Resp 16 | Ht 71.5 in | Wt 237.0 lb

## 2015-04-01 DIAGNOSIS — B182 Chronic viral hepatitis C: Secondary | ICD-10-CM | POA: Diagnosis not present

## 2015-04-01 DIAGNOSIS — N138 Other obstructive and reflux uropathy: Secondary | ICD-10-CM

## 2015-04-01 DIAGNOSIS — R7309 Other abnormal glucose: Secondary | ICD-10-CM | POA: Diagnosis not present

## 2015-04-01 DIAGNOSIS — E559 Vitamin D deficiency, unspecified: Secondary | ICD-10-CM

## 2015-04-01 DIAGNOSIS — I10 Essential (primary) hypertension: Secondary | ICD-10-CM

## 2015-04-01 DIAGNOSIS — F17200 Nicotine dependence, unspecified, uncomplicated: Secondary | ICD-10-CM

## 2015-04-01 DIAGNOSIS — Z23 Encounter for immunization: Secondary | ICD-10-CM

## 2015-04-01 DIAGNOSIS — Z9181 History of falling: Secondary | ICD-10-CM

## 2015-04-01 DIAGNOSIS — Z0001 Encounter for general adult medical examination with abnormal findings: Secondary | ICD-10-CM | POA: Diagnosis not present

## 2015-04-01 DIAGNOSIS — F3170 Bipolar disorder, currently in remission, most recent episode unspecified: Secondary | ICD-10-CM

## 2015-04-01 DIAGNOSIS — R6889 Other general symptoms and signs: Secondary | ICD-10-CM | POA: Diagnosis not present

## 2015-04-01 DIAGNOSIS — Z79899 Other long term (current) drug therapy: Secondary | ICD-10-CM | POA: Diagnosis not present

## 2015-04-01 DIAGNOSIS — E782 Mixed hyperlipidemia: Secondary | ICD-10-CM | POA: Diagnosis not present

## 2015-04-01 DIAGNOSIS — Z789 Other specified health status: Secondary | ICD-10-CM

## 2015-04-01 DIAGNOSIS — N401 Enlarged prostate with lower urinary tract symptoms: Secondary | ICD-10-CM

## 2015-04-01 DIAGNOSIS — Z0000 Encounter for general adult medical examination without abnormal findings: Secondary | ICD-10-CM

## 2015-04-01 DIAGNOSIS — E119 Type 2 diabetes mellitus without complications: Secondary | ICD-10-CM | POA: Diagnosis not present

## 2015-04-01 DIAGNOSIS — Z1212 Encounter for screening for malignant neoplasm of rectum: Secondary | ICD-10-CM

## 2015-04-01 LAB — CBC WITH DIFFERENTIAL/PLATELET
BASOS PCT: 1 % (ref 0–1)
Basophils Absolute: 0.1 10*3/uL (ref 0.0–0.1)
EOS ABS: 0.1 10*3/uL (ref 0.0–0.7)
Eosinophils Relative: 2 % (ref 0–5)
HCT: 43.3 % (ref 39.0–52.0)
HEMOGLOBIN: 14.8 g/dL (ref 13.0–17.0)
Lymphocytes Relative: 29 % (ref 12–46)
Lymphs Abs: 1.7 10*3/uL (ref 0.7–4.0)
MCH: 31.8 pg (ref 26.0–34.0)
MCHC: 34.2 g/dL (ref 30.0–36.0)
MCV: 92.9 fL (ref 78.0–100.0)
MONO ABS: 0.6 10*3/uL (ref 0.1–1.0)
MONOS PCT: 10 % (ref 3–12)
MPV: 9.9 fL (ref 8.6–12.4)
NEUTROS ABS: 3.4 10*3/uL (ref 1.7–7.7)
NEUTROS PCT: 58 % (ref 43–77)
PLATELETS: 244 10*3/uL (ref 150–400)
RBC: 4.66 MIL/uL (ref 4.22–5.81)
RDW: 13.6 % (ref 11.5–15.5)
WBC: 5.9 10*3/uL (ref 4.0–10.5)

## 2015-04-01 MED ORDER — AMITRIPTYLINE HCL 25 MG PO TABS: ORAL_TABLET | ORAL | Status: DC

## 2015-04-01 MED ORDER — ALBUTEROL SULFATE HFA 108 (90 BASE) MCG/ACT IN AERS: 2.0000 | INHALATION_SPRAY | RESPIRATORY_TRACT | Status: DC | PRN

## 2015-04-01 NOTE — Progress Notes (Signed)
MEDICARE ANNUAL WELLNESS VISIT AND FOLLOW UP Assessment:   1. Essential hypertension - continue medications, DASH diet, exercise and monitor at home. Call if greater than 130/80.  - DG Chest 2 View; Future - EKG 12-Lead - CBC with Differential/Platelet - BASIC METABOLIC PANEL WITH GFR - Hepatic function panel - TSH - Urinalysis, Routine w reflex microscopic (not at [facility]) - Microalbumin / creatinine urine ratio  2. Abnormal glucose Discussed general issues about diabetes pathophysiology and management., Educational material distributed., Suggested low cholesterol diet., Encouraged aerobic exercise., Discussed foot care., Reminded to get yearly retinal exam. - Hemoglobin A1c  3. Morbid obesity, unspecified obesity type ([place]) Obesity with co morbidities- long discussion about weight loss, diet, and exercise  4. Mixed hyperlipidemia -continue medications, check lipids, decrease fatty foods, increase activity.  - Lipid panel  5. Vitamin D deficiency - Vit D  25 hydroxy (rtn osteoporosis monitoring)  6. Medication management - Magnesium  7. Bipolar disorder in full remission, most recent episode unspecified type ([place]) Controlled on medications  8. Chronic hepatitis C without hepatic coma ([place]) Since has insurance will refer to Hep C clinic, last labs were in 2014, showed elevated Hep C titer - AMB referral to hepatitis C clinic  9. Tobacco use disorder Smoking cessation-  instruction/counseling given, counseled patient on the dangers of tobacco use, advised patient to stop smoking, and reviewed strategies to maximize success, patient not ready to quit at this time.  - DG Chest 2 View; Future  10. BPH with obstruction/lower urinary tract symptoms - PSA  11. Need for prophylactic vaccination with combined diphtheria-tetanus-pertussis (DTP) vaccine - Dt vaccine greater than 7yo IM  12. Other abnormal glucose Discussed general issues about diabetes pathophysiology and  management., Educational material distributed., Suggested low cholesterol diet., Encouraged aerobic exercise., Discussed foot care., Reminded to get yearly retinal exam.  13. Screening for rectal cancer - Ambulatory referral to Gastroenterology  Over 30 minutes of exam, counseling, chart review, and critical decision making was performed  Plan:   During the course of the visit the patient was educated and counseled about appropriate screening and preventive services including:    Pneumococcal vaccine   Influenza vaccine  Prevnar 13  Td vaccine  Screening electrocardiogram  Colorectal cancer screening  Diabetes screening  Glaucoma screening  Nutrition counseling   Conditions/risks identified: BMI: Discussed weight loss, diet, and increase physical activity.  Increase physical activity: AHA recommends 150 minutes of physical activity a week.  Medications reviewed Diabetes is at goal, ACE/ARB therapy: Yes. Urinary Incontinence is not an issue: discussed non pharmacology and pharmacology options.  Fall risk: low- discussed PT, home fall assessment, medications.    Subjective:  Howard Fox is a 54 y.o. male who presents for Medicare Annual Wellness Visit and 3 month follow up for HTN, hyperlipidemia, prediabetes, and vitamin D Def.  Date of last medicare wellness visit was is unknown, has been on disability since 2008.  His blood pressure has been controlled at home, runs 120-13070's at home, today their BP is BP: 140/80 mmHg He does workout, does yard work. He denies chest pain, shortness of breath, dizziness.  He is on cholesterol medication and denies myalgias. His cholesterol is not at goal. The cholesterol last visit was:   Lab Results  Component Value Date   CHOL 197 12/10/2014   HDL 51 12/10/2014   LDLCALC 127* 12/10/2014   TRIG 97 12/10/2014   CHOLHDL 3.9 12/10/2014   He has been working on diet and exercise  for prediabetes, and denies paresthesia of the  feet, polydipsia, polyuria and visual disturbances. Last A1C in the office was:  Lab Results  Component Value Date   HGBA1C 5.6 12/10/2014  Patient is on Vitamin D supplement.   Lab Results  Component Value Date   VD25OH 27* 12/10/2014   Treated for Hep c in 2008.  Lab Results  Component Value Date   ALT 23 12/10/2014   AST 22 12/10/2014   ALKPHOS 60 12/10/2014   BILITOT 0.3 12/10/2014   Has history of bipolar/mood DO, on celexa and xanax only, denies any bipolar manic depression episodes.  BMI is Body mass index is 32.6 kg/(m^2)., he is working on diet and exercise. Wt Readings from Last 3 Encounters:  04/01/15 237 lb (107.502 kg)  12/10/14 243 lb 3.2 oz (110.315 kg)  07/11/14 237 lb (107.502 kg)    Medication Review: Current Outpatient Prescriptions on File Prior to Visit  Medication Sig Dispense Refill  . ALPRAZolam (XANAX) 1 MG tablet TAKE ONE-HALF TO ONE TABLET BY MOUTH THREE TIMES DAILY AS NEEDED 90 tablet 0  . amitriptyline (ELAVIL) 25 MG tablet TAKE ONE TABLET BY MOUTH 4 TIMES DAILY FOR MOOD 120 tablet 0  . citalopram (CELEXA) 40 MG tablet Take 1 tablet (40 mg total) by mouth daily. 90 tablet 0  . ibuprofen (ADVIL,MOTRIN) 200 MG tablet Take 800 mg by mouth every 6 (six) hours as needed for pain.    Marland Kitchen lisinopril-hydrochlorothiazide (PRINZIDE,ZESTORETIC) 20-25 MG per tablet TAKE ONE TABLET BY MOUTH ONCE DAILY 90 tablet 0  . pravastatin (PRAVACHOL) 40 MG tablet Take 1 tablet (40 mg total) by mouth daily. 30 tablet 3   No current facility-administered medications on file prior to visit.    Current Problems (verified) Patient Active Problem List   Diagnosis Date Noted  . Hepatitis C, chronic ([place]) 04/01/2015  . Tobacco use disorder 04/01/2015  . Diabetes mellitus without complication ([place]) 60/63/0160  . Morbid obesity (BMI 32.14) 12/10/2014  . Medication management 12/03/2013  . Essential hypertension 04/25/2013  . Mixed hyperlipidemia 04/25/2013  . Vitamin D  deficiency 04/25/2013  . Bipolar disorder ([place]) 02/19/2007    Screening Tests Immunization History  Administered Date(s) Administered  . Td 05/23/2004    Preventative care: Last colonoscopy: DUE Stress test 2006 neg CXR overdue  Prior vaccinations: TD or Tdap: 2006 DUE  Influenza: declines Pneumococcal: N/A Prevnar13: N/A Shingles/Zostavax: N/A  Names of Other Physician/Practitioners you currently use: 1. [facility] here for primary care 2. [facility], Dr. Reg[facility] in [place] eye doctor, last visit 3 months ago 3. Dentures, Patient Care Team: Unk Pinto, MD as PCP - General (Internal Medicine) Nickie Retort, MD as Consulting Physician (Urology)  Past Surgical History  Procedure Laterality Date  . Cholecystectomy     Family History  Problem Relation Age of Onset  . Mental illness Mother     Bipolar  . Stroke Father   . Arthritis Father    Social History  Substance Use Topics  . Smoking status: Current Every Day Smoker -- 0.50 packs/day  . Smokeless tobacco: Never Used  . Alcohol Use: 1.5 oz/week    3 drink(s) per week    MEDICARE WELLNESS OBJECTIVES: Tobacco use: He does smoke.  Patient is a former smoker. If yes, counseling given Alcohol Current alcohol use: social drinker Osteoporosis: dietary calcium and/or vitamin D deficiency, History of fracture in the past year: no Fall risk: Low Risk Hearing: normal Visual acuity:  impaired,  does perform annual eye exam Diet: in general, a "healthy" diet   Physical activity:   Cardiac risk factors:   Depression/mood screen:   Depression screen [facility] 2/9 04/01/2015  Decreased Interest 0  Down, Depressed, Hopeless 0  PHQ - 2 Score 0    ADLs:  No flowsheet data found.   Cognitive Testing  Alert? Yes  Normal Appearance?Yes  Oriented to person? Yes  Place? Yes   Time? Yes  Recall of three objects?  Yes  Can perform simple calculations? Yes  Displays  appropriate judgment?Yes  Can read the correct time from a watch face?Yes  EOL planning: Does patient have an advance directive?: Yes Type of Advance Directive: Healthcare Power of Attorney, Living will Does patient want to make changes to advanced directive?: No - Patient declined Copy of advanced directive(s) in chart?: No - copy requested   Objective:   Today's Vitals   04/01/15 1439  BP: 140/80  Pulse: 83  Temp: 98.1 F (36.7 C)  TempSrc: Temporal  Resp: 16  Height: 5' 11.5" (1.816 m)  Weight: 237 lb (107.502 kg)  SpO2: 95%   Body mass index is 32.6 kg/(m^2).  General appearance: alert, no distress, WD/WN, male HEENT: normocephalic, sclerae anicteric, TMs pearly, nares patent, no discharge or erythema, pharynx normal Oral cavity: MMM, no lesions Neck: supple, no lymphadenopathy, no thyromegaly, no masses Heart: RRR, normal S1, S2, no murmurs Lungs: CTA bilaterally, no wheezes, rhonchi, or rales Abdomen: well healing vertical scar, left sided ventral hernia, +bs, soft, non tender, non distended, no masses, no hepatomegaly, no splenomegaly Musculoskeletal: nontender, no swelling, no obvious deformity Extremities: no edema, no cyanosis, no clubbing Pulses: 2+ symmetric, upper and lower extremities, normal cap refill Neurological: alert, oriented x 3, CN2-12 intact, strength normal upper extremities and lower extremities, sensation normal throughout, DTRs 2+ throughout, no cerebellar signs, gait antaglic Psychiatric: normal affect, behavior normal, pleasant   Medicare Attestation I have personally reviewed: The patient's medical and social history Their use of alcohol, tobacco or illicit drugs Their current medications and supplements The patient's functional ability including ADLs,fall risks, home safety risks, cognitive, and hearing and visual impairment Diet and physical activities Evidence for depression or mood disorders  The patient's weight, height, BMI, and  visual acuity have been recorded in the chart.  I have made referrals, counseling, and provided education to the patient based on review of the above and I have provided the patient with a written personalized care plan for preventive services.     Vicie Mutters, PA-C   04/01/2015

## 2015-04-01 NOTE — Patient Instructions (Signed)
We want weight loss that will last so you should lose 1-2 pounds a week.  THAT IS IT! Please pick THREE things a month to change. Once it is a habit check off the item. Then pick another three items off the list to become habits.  If you are already doing a habit on the list GREAT!  Cross that item off! o Don't drink your calories. Ie, alcohol, soda, fruit juice, and sweet tea.  o Drink more water. Drink a glass when you feel hungry or before each meal.  o Eat breakfast - Complex carb and protein (likeDannon light and fit yogurt, oatmeal, fruit, eggs, [place] bacon). o Measure your cereal.  Eat no more than one cup a day. (ie [place]) o Eat an apple a day. o Add a vegetable a day. o Try a new vegetable a month. o Use Pam! Stop using oil or butter to cook. o Don't finish your plate or use smaller plates. o Share your dessert. o Eat sugar free Jello for dessert or frozen grapes. o Don't eat 2-3 hours before bed. o Switch to whole wheat bread, pasta, and brown rice. o Make healthier choices when you eat out. No fries! o Pick baked chicken, NOT fried. o Don't forget to SLOW DOWN when you eat. It is not going anywhere.  o Take the stairs. o Park far away in the parking lot o [employer] (or weights) for 10 minutes while watching TV. o Walk at work for 10 minutes during break. o Walk outside 1 time a week with your friend, kids, dog, or significant other. o Start a walking group at [place] the mall as much as you can tolerate.  o Keep a food diary. o Weigh yourself daily. o Walk for 15 minutes 3 days per week. o Cook at home more often and eat out less.  If life happens and you go back to old habits, it is okay.  Just start over. You can do it!   If you experience chest pain, get short of breath, or tired during the exercise, please stop immediately and inform your doctor.   Preventive Care for Adults  A healthy lifestyle and preventive care can promote health and wellness.  Preventive health guidelines for men include the following key practices:  A routine yearly physical is a good way to check with your health care provider about your health and preventative screening. It is a chance to share any concerns and updates on your health and to receive a thorough exam.  Visit your dentist for a routine exam and preventative care every 6 months. Brush your teeth twice a day and floss once a day. Good oral hygiene prevents tooth decay and gum disease.  The frequency of eye exams is based on your age, health, family medical history, use of contact lenses, and other factors. Follow your health care provider's recommendations for frequency of eye exams.  Eat a healthy diet. Foods such as vegetables, fruits, whole grains, low-fat dairy products, and lean protein foods contain the nutrients you need without too many calories. Decrease your intake of foods high in solid fats, added sugars, and salt. Eat the right amount of calories for you.Get information about a proper diet from your health care provider, if necessary.  Regular physical exercise is one of the most important things you can do for your health. Most adults should get at least 150 minutes of moderate-intensity exercise (any activity that increases your heart rate and  causes you to sweat) each week. In addition, most adults need muscle-strengthening exercises on 2 or more days a week.  Maintain a healthy weight. The body mass index (BMI) is a screening tool to identify possible weight problems. It provides an estimate of body fat based on height and weight. Your health care provider can find your BMI and can help you achieve or maintain a healthy weight.For adults 20 years and older:  A BMI below 18.5 is considered underweight.  A BMI of 18.5 to 24.9 is normal.  A BMI of 25 to 29.9 is considered overweight.  A BMI of 30 and above is considered obese.  Maintain normal blood lipids and cholesterol levels by  exercising and minimizing your intake of saturated fat. Eat a balanced diet with plenty of fruit and vegetables. Blood tests for lipids and cholesterol should begin at age 87 and be repeated every 5 years. If your lipid or cholesterol levels are high, you are over 50, or you are at high risk for heart disease, you may need your cholesterol levels checked more frequently.Ongoing high lipid and cholesterol levels should be treated with medicines if diet and exercise are not working.  If you smoke, find out from your health care provider how to quit. If you do not use tobacco, do not start.  Lung cancer screening is recommended for adults aged 73-80 years who are at high risk for developing lung cancer because of a history of smoking. A yearly low-dose CT scan of the lungs is recommended for people who have at least a 30-pack-year history of smoking and are a current smoker or have quit within the past 15 years. A pack year of smoking is smoking an average of 1 pack of cigarettes a day for 1 year (for example: 1 pack a day for 30 years or 2 packs a day for 15 years). Yearly screening should continue until the smoker has stopped smoking for at least 15 years. Yearly screening should be stopped for people who develop a health problem that would prevent them from having lung cancer treatment.  If you choose to drink alcohol, do not have more than 2 drinks per day. One drink is considered to be 12 ounces (355 mL) of beer, 5 ounces (148 mL) of wine, or 1.5 ounces (44 mL) of liquor.  Avoid use of street drugs. Do not share needles with anyone. Ask for help if you need support or instructions about stopping the use of drugs.  High blood pressure causes heart disease and increases the risk of stroke. Your blood pressure should be checked at least every 1-2 years. Ongoing high blood pressure should be treated with medicines, if weight loss and exercise are not effective.  If you are 29-75 years old, ask your health  care provider if you should take aspirin to prevent heart disease.  Diabetes screening involves taking a blood sample to check your fasting blood sugar level. This should be done once every 3 years, after age 50, if you are within normal weight and without risk factors for diabetes. Testing should be considered at a younger age or be carried out more frequently if you are overweight and have at least 1 risk factor for diabetes.  Colorectal cancer can be detected and often prevented. Most routine colorectal cancer screening begins at the age of 30 and continues through age 68. However, your health care provider may recommend screening at an earlier age if you have risk factors for colon cancer. On  a yearly basis, your health care provider may provide home test kits to check for hidden blood in the stool. Use of a small camera at the end of a tube to directly examine the colon (sigmoidoscopy or colonoscopy) can detect the earliest forms of colorectal cancer. Talk to your health care provider about this at age 92, when routine screening begins. Direct exam of the colon should be repeated every 5-10 years through age 59, unless early forms of precancerous polyps or small growths are found.   Talk with your health care provider about prostate cancer screening.  Testicular cancer screening isrecommended for adult males. Screening includes self-exam, a health care provider exam, and other screening tests. Consult with your health care provider about any symptoms you have or any concerns you have about testicular cancer.  Use sunscreen. Apply sunscreen liberally and repeatedly throughout the day. You should seek shade when your shadow is shorter than you. Protect yourself by wearing long sleeves, pants, a wide-brimmed hat, and sunglasses year round, whenever you are outdoors.  Once a month, do a whole-body skin exam, using a mirror to look at the skin on your back. Tell your health care provider about new moles,  moles that have irregular borders, moles that are larger than a pencil eraser, or moles that have changed in shape or color.  Stay current with required vaccines (immunizations).  Influenza vaccine. All adults should be immunized every year.  Tetanus, diphtheria, and acellular pertussis (Td, Tdap) vaccine. An adult who has not previously received Tdap or who does not know his vaccine status should receive 1 dose of Tdap. This initial dose should be followed by tetanus and diphtheria toxoids (Td) booster doses every 10 years. Adults with an unknown or incomplete history of completing a 3-dose immunization series with Td-containing vaccines should begin or complete a primary immunization series including a Tdap dose. Adults should receive a Td booster every 10 years.  Varicella vaccine. An adult without evidence of immunity to varicella should receive 2 doses or a second dose if he has previously received 1 dose.  Human papillomavirus (HPV) vaccine. Males aged 29-21 years who have not received the vaccine previously should receive the 3-dose series. Males aged 22-26 years may be immunized. Immunization is recommended through the age of 93 years for any male who has sex with males and did not get any or all doses earlier. Immunization is recommended for any person with an immunocompromised condition through the age of 88 years if he did not get any or all doses earlier. During the 3-dose series, the second dose should be obtained 4-8 weeks after the first dose. The third dose should be obtained 24 weeks after the first dose and 16 weeks after the second dose.  Zoster vaccine. One dose is recommended for adults aged 76 years or older unless certain conditions are present.    PREVNAR  - Pneumococcal 13-valent conjugate (PCV13) vaccine. When indicated, a person who is uncertain of his immunization history and has no record of immunization should receive the PCV13 vaccine. An adult aged 29 years or older  who has certain medical conditions and has not been previously immunized should receive 1 dose of PCV13 vaccine. This PCV13 should be followed with a dose of pneumococcal polysaccharide (PPSV23) vaccine. The PPSV23 vaccine dose should be obtained at least 8 weeks after the dose of PCV13 vaccine. An adult aged 12 years or older who has certain medical conditions and previously received 1 or more doses of  PPSV23 vaccine should receive 1 dose of PCV13. The PCV13 vaccine dose should be obtained 1 or more years after the last PPSV23 vaccine dose.    PNEUMOVAX - Pneumococcal polysaccharide (PPSV23) vaccine. When PCV13 is also indicated, PCV13 should be obtained first. All adults aged 26 years and older should be immunized. An adult younger than age 29 years who has certain medical conditions should be immunized. Any person who resides in a nursing home or long-term care facility should be immunized. An adult smoker should be immunized. People with an immunocompromised condition and certain other conditions should receive both PCV13 and PPSV23 vaccines. People with human immunodeficiency virus (HIV) infection should be immunized as soon as possible after diagnosis. Immunization during chemotherapy or radiation therapy should be avoided. Routine use of PPSV23 vaccine is not recommended for American Indians, [place] Natives, or people younger than 65 years unless there are medical conditions that require PPSV23 vaccine. When indicated, people who have unknown immunization and have no record of immunization should receive PPSV23 vaccine. One-time revaccination 5 years after the first dose of PPSV23 is recommended for people aged 19-64 years who have chronic kidney failure, nephrotic syndrome, asplenia, or immunocompromised conditions. People who received 1-2 doses of PPSV23 before age 9 years should receive another dose of PPSV23 vaccine at age 65 years or later if at least 5 years have passed since the previous dose.  Doses of PPSV23 are not needed for people immunized with PPSV23 at or after age 29 years.    Hepatitis A vaccine. Adults who wish to be protected from this disease, have certain high-risk conditions, work with hepatitis A-infected animals, work in hepatitis A research labs, or travel to or work in countries with a high rate of hepatitis A should be immunized. Adults who were previously unvaccinated and who anticipate close contact with an international adoptee during the first 60 days after arrival in the [place] from a country with a high rate of hepatitis A should be immunized.    Hepatitis B vaccine. Adults should be immunized if they wish to be protected from this disease, have certain high-risk conditions, may be exposed to blood or other infectious body fluids, are household contacts or sex partners of hepatitis B positive people, are clients or workers in certain care facilities, or travel to or work in countries with a high rate of hepatitis B.   Preventive Service / Frequency   Ages 48 to 32  Blood pressure check.  Lipid and cholesterol check  Lung cancer screening. / Every year if you are aged 38-80 years and have a 30-pack-year history of smoking and currently smoke or have quit within the past 15 years. Yearly screening is stopped once you have quit smoking for at least 15 years or develop a health problem that would prevent you from having lung cancer treatment.  Fecal occult blood test (FOBT) of stool. / Every year beginning at age 69 and continuing until age 43. You may not have to do this test if you get a colonoscopy every 10 years.  Flexible sigmoidoscopy** or colonoscopy.** / Every 5 years for a flexible sigmoidoscopy or every 10 years for a colonoscopy beginning at age 65 and continuing until age 88. Screening for abdominal aortic aneurysm (AAA)  by ultrasound is recommended for people who have history of high blood pressure or who are current or former  smokers.

## 2015-04-02 LAB — HEPATIC FUNCTION PANEL
ALT: 24 U/L (ref 9–46)
AST: 29 U/L (ref 10–35)
Albumin: 4 g/dL (ref 3.6–5.1)
Alkaline Phosphatase: 60 U/L (ref 40–115)
BILIRUBIN DIRECT: 0.1 mg/dL (ref <=0.2)
BILIRUBIN INDIRECT: 0.4 mg/dL (ref 0.2–1.2)
BILIRUBIN TOTAL: 0.5 mg/dL (ref 0.2–1.2)
Total Protein: 6.7 g/dL (ref 6.1–8.1)

## 2015-04-02 LAB — BASIC METABOLIC PANEL WITH GFR
BUN: 12 mg/dL (ref 7–25)
CALCIUM: 9.5 mg/dL (ref 8.6–10.3)
CO2: 25 mmol/L (ref 20–31)
CREATININE: 0.81 mg/dL (ref 0.70–1.33)
Chloride: 107 mmol/L (ref 98–110)
Glucose, Bld: 87 mg/dL (ref 65–99)
Potassium: 4.2 mmol/L (ref 3.5–5.3)
SODIUM: 140 mmol/L (ref 135–146)

## 2015-04-02 LAB — LIPID PANEL
CHOL/HDL RATIO: 3 ratio (ref <=5.0)
CHOLESTEROL: 181 mg/dL (ref 125–200)
HDL: 60 mg/dL (ref >=40)
LDL Cholesterol: 109 mg/dL (ref <130)
TRIGLYCERIDES: 58 mg/dL (ref <150)
VLDL: 12 mg/dL (ref <30)

## 2015-04-02 LAB — HEMOGLOBIN A1C
Hgb A1c MFr Bld: 5.7 % — ABNORMAL HIGH (ref <5.7)
MEAN PLASMA GLUCOSE: 117 mg/dL — AB (ref <117)

## 2015-04-02 LAB — URINALYSIS, ROUTINE W REFLEX MICROSCOPIC
Bilirubin Urine: NEGATIVE
Glucose, UA: NEGATIVE
Hgb urine dipstick: NEGATIVE
KETONES UR: NEGATIVE
LEUKOCYTES UA: NEGATIVE
NITRITE: NEGATIVE
Protein, ur: NEGATIVE
SPECIFIC GRAVITY, URINE: 1.021 (ref 1.001–1.035)
pH: 6 (ref 5.0–8.0)

## 2015-04-02 LAB — VITAMIN D 25 HYDROXY (VIT D DEFICIENCY, FRACTURES): Vit D, 25-Hydroxy: 39 ng/mL (ref 30–100)

## 2015-04-02 LAB — MICROALBUMIN / CREATININE URINE RATIO
CREATININE, URINE: 183 mg/dL (ref 20–370)
MICROALB UR: 0.7 mg/dL
MICROALB/CREAT RATIO: 4 ug/mg{creat} (ref <30)

## 2015-04-02 LAB — MAGNESIUM: Magnesium: 2.2 mg/dL (ref 1.5–2.5)

## 2015-04-02 LAB — PSA: PSA: 1.12 ng/mL (ref <=4.00)

## 2015-04-02 LAB — TSH: TSH: 0.582 u[IU]/mL (ref 0.350–4.500)

## 2015-04-27 ENCOUNTER — Other Ambulatory Visit: Payer: Self-pay | Admitting: Internal Medicine

## 2015-04-27 DIAGNOSIS — IMO0001 Reserved for inherently not codable concepts without codable children: Secondary | ICD-10-CM

## 2015-05-04 ENCOUNTER — Other Ambulatory Visit: Payer: Self-pay | Admitting: Internal Medicine

## 2015-05-11 ENCOUNTER — Ambulatory Visit: Payer: Self-pay | Admitting: Internal Medicine

## 2015-06-11 ENCOUNTER — Encounter: Payer: Self-pay | Admitting: Internal Medicine

## 2015-06-11 ENCOUNTER — Ambulatory Visit (INDEPENDENT_AMBULATORY_CARE_PROVIDER_SITE_OTHER): Payer: Medicare Other | Admitting: Internal Medicine

## 2015-06-11 VITALS — BP 142/80 | HR 94 | Temp 98.0°F | Resp 18 | Ht 71.5 in | Wt 246.0 lb

## 2015-06-11 DIAGNOSIS — L03019 Cellulitis of unspecified finger: Secondary | ICD-10-CM

## 2015-06-11 DIAGNOSIS — IMO0002 Reserved for concepts with insufficient information to code with codable children: Secondary | ICD-10-CM

## 2015-06-11 DIAGNOSIS — B351 Tinea unguium: Secondary | ICD-10-CM

## 2015-06-11 MED ORDER — SULFAMETHOXAZOLE-TRIMETHOPRIM 800-160 MG PO TABS: 1.0000 | ORAL_TABLET | Freq: Two times a day (BID) | ORAL | Status: DC

## 2015-06-11 NOTE — Patient Instructions (Signed)
Nail Ringworm A fungal infection of the nail (tinea unguium/onychomycosis) is common. It is common as the visible part of the nail is composed of dead cells which have no blood supply to help prevent infection. It occurs because fungi are everywhere and will pick any opportunity to grow on any dead material. Because nails are very slow growing they require up to 2 years of treatment with anti-fungal medications. The entire nail back to the base is infected. This includes approximately  of the nail which you cannot see. If your caregiver has prescribed a medication by mouth, take it every day and as directed. No progress will be seen for at least 6 to 9 months. Do not be disappointed! Because fungi live on dead cells with little or no exposure to blood supply, medication delivery to the infection is slow; thus the cure is slow. It is also why you can observe no progress in the first 6 months. The nail becoming cured is the base of the nail, as it has the blood supply.  Important in successful treatment of nail fungus is closely following the medication regimen that your doctor prescribes. Sometimes you and your caregiver may elect to speed up this process by surgical removal of all the nails. Even this may still require 6 to 9 months of additional oral medications. See your caregiver as directed. Remember there will be no visible improvement for at least 6 months. See your caregiver sooner if other signs of infection (redness and swelling) develop.   This information is not intended to replace advice given to you by your health care provider. Make sure you discuss any questions you have with your health care provider.   Document Released: 05/06/2000 Document Revised: 09/23/2014 Document Reviewed: 11/10/2014 Elsevier Interactive Patient Education 2016 [employer]  Nail fungus is very common and very difficult to treat. Sometime if caught early topical treatment can work. You can try terbinafine over the  counter, apply twice daily for 1-3 months to your toenails. If this does not work there is a once a day oral medicine that you have to take for 3 months. Sometime this medication may have to be repeated. It is processed through your liver so we often ask patients to come back for a liver function test and to report any AB pain, yellowing of the skin, or fluid retention. If the topical treatment does not work for you we can start you on the pill.

## 2015-06-11 NOTE — Progress Notes (Signed)
   Subjective:    Patient ID: Howard Fox, male    DOB: 25-Jan-1961, 55 y.o.   MRN: [id number]  HPI  Patient presents to the office for evaluation of right thumb and left great toenail.  He reports that the left great toe has been bothering him a year.  It is thicker and yellowing and he hasn't done much about it.  He thinks that it is a fungal infection  He reports that the right thumb has been mildly red and that has been going on 3 weeks.  The thumbnail has always been slightly deformed.  He reports that he has shut his finger in the door in the past.  The thumb is not painful.  Nothing that he does causes rubbing on the thumb.     Review of Systems  Constitutional: Negative for fever, chills and fatigue.  Gastrointestinal: Negative for nausea and vomiting.  Skin: Positive for color change.       Objective:   Physical Exam  Constitutional: He appears well-developed and well-nourished. No distress.  HENT:  Head: Normocephalic.  Mouth/Throat: Oropharynx is clear and moist. No oropharyngeal exudate.  Eyes: Conjunctivae are normal. No scleral icterus.  Neck: Normal range of motion. Neck supple. No JVD present. No thyromegaly present.  Cardiovascular: Normal rate, regular rhythm, normal heart sounds and intact distal pulses.  Exam reveals no gallop and no friction rub.   No murmur heard. Pulmonary/Chest: Effort normal and breath sounds normal. No respiratory distress. He has no wheezes. He has no rales. He exhibits no tenderness.  Lymphadenopathy:    He has no cervical adenopathy.  Skin: He is not diaphoretic.     Nursing note and vitals reviewed.   Filed Vitals:   06/11/15 1409  BP: 142/80  Pulse: 94  Temp: 98 F (36.7 C)  Resp: 18          Assessment & Plan:    1. Paronychia, unspecified laterality -bactrim DS x 7 days -warm water soaks -no picking  2. Onychomycosis -not a candidate for lamisil given hep c -try topicals otc

## 2015-06-23 ENCOUNTER — Encounter: Payer: Self-pay | Admitting: Internal Medicine

## 2015-07-08 ENCOUNTER — Ambulatory Visit: Payer: Self-pay | Admitting: Internal Medicine

## 2015-07-14 ENCOUNTER — Other Ambulatory Visit: Payer: Self-pay | Admitting: Internal Medicine

## 2015-07-16 ENCOUNTER — Ambulatory Visit: Payer: Self-pay | Admitting: Internal Medicine

## 2015-07-29 ENCOUNTER — Encounter: Payer: Self-pay | Admitting: Internal Medicine

## 2015-07-30 ENCOUNTER — Ambulatory Visit (INDEPENDENT_AMBULATORY_CARE_PROVIDER_SITE_OTHER): Payer: Medicare Other | Admitting: Internal Medicine

## 2015-07-30 ENCOUNTER — Encounter: Payer: Self-pay | Admitting: Internal Medicine

## 2015-07-30 VITALS — BP 148/80 | HR 90 | Temp 97.8°F | Resp 16 | Ht 71.5 in | Wt 240.0 lb

## 2015-07-30 DIAGNOSIS — Z79899 Other long term (current) drug therapy: Secondary | ICD-10-CM | POA: Diagnosis not present

## 2015-07-30 DIAGNOSIS — R7309 Other abnormal glucose: Secondary | ICD-10-CM | POA: Diagnosis not present

## 2015-07-30 DIAGNOSIS — E559 Vitamin D deficiency, unspecified: Secondary | ICD-10-CM

## 2015-07-30 DIAGNOSIS — I10 Essential (primary) hypertension: Secondary | ICD-10-CM

## 2015-07-30 DIAGNOSIS — E782 Mixed hyperlipidemia: Secondary | ICD-10-CM

## 2015-07-30 LAB — HEPATIC FUNCTION PANEL
ALBUMIN: 4.3 g/dL (ref 3.6–5.1)
ALK PHOS: 69 U/L (ref 40–115)
ALT: 32 U/L (ref 9–46)
AST: 33 U/L (ref 10–35)
Bilirubin, Direct: 0.1 mg/dL (ref <=0.2)
Indirect Bilirubin: 0.4 mg/dL (ref 0.2–1.2)
TOTAL PROTEIN: 7.5 g/dL (ref 6.1–8.1)
Total Bilirubin: 0.5 mg/dL (ref 0.2–1.2)

## 2015-07-30 LAB — BASIC METABOLIC PANEL WITH GFR
BUN: 13 mg/dL (ref 7–25)
CO2: 27 mmol/L (ref 20–31)
Calcium: 10 mg/dL (ref 8.6–10.3)
Chloride: 100 mmol/L (ref 98–110)
Creat: 0.97 mg/dL (ref 0.70–1.33)
GFR, Est Non African American: 88 mL/min (ref >=60)
GLUCOSE: 114 mg/dL — AB (ref 65–99)
POTASSIUM: 4.6 mmol/L (ref 3.5–5.3)
Sodium: 135 mmol/L (ref 135–146)

## 2015-07-30 LAB — TSH: TSH: 0.9 m[IU]/L (ref 0.40–4.50)

## 2015-07-30 LAB — LIPID PANEL
Cholesterol: 168 mg/dL (ref 125–200)
HDL: 60 mg/dL
LDL Cholesterol: 97 mg/dL
Total CHOL/HDL Ratio: 2.8 ratio
Triglycerides: 53 mg/dL
VLDL: 11 mg/dL

## 2015-07-30 LAB — CBC WITH DIFFERENTIAL/PLATELET
BASOS ABS: 0 10*3/uL (ref 0.0–0.1)
Basophils Relative: 0 % (ref 0–1)
EOS PCT: 2 % (ref 0–5)
Eosinophils Absolute: 0.1 10*3/uL (ref 0.0–0.7)
HEMATOCRIT: 46.2 % (ref 39.0–52.0)
Hemoglobin: 15.7 g/dL (ref 13.0–17.0)
LYMPHS ABS: 1.5 10*3/uL (ref 0.7–4.0)
LYMPHS PCT: 23 % (ref 12–46)
MCH: 30.8 pg (ref 26.0–34.0)
MCHC: 34 g/dL (ref 30.0–36.0)
MCV: 90.6 fL (ref 78.0–100.0)
MONO ABS: 0.6 10*3/uL (ref 0.1–1.0)
MONOS PCT: 9 % (ref 3–12)
MPV: 10.1 fL (ref 8.6–12.4)
Neutro Abs: 4.3 10*3/uL (ref 1.7–7.7)
Neutrophils Relative %: 66 % (ref 43–77)
Platelets: 263 10*3/uL (ref 150–400)
RBC: 5.1 MIL/uL (ref 4.22–5.81)
RDW: 13.8 % (ref 11.5–15.5)
WBC: 6.5 10*3/uL (ref 4.0–10.5)

## 2015-07-30 MED ORDER — CITALOPRAM HYDROBROMIDE 40 MG PO TABS: 40.0000 mg | ORAL_TABLET | Freq: Every day | ORAL | Status: DC

## 2015-07-30 NOTE — Progress Notes (Signed)
Assessment and Plan:  Hypertension:  -Continue medication,  -monitor blood pressure at home.  -Continue DASH diet.   -Reminder to go to the ER if any CP, SOB, nausea, dizziness, severe HA, changes vision/speech, left arm numbness and tingling, and jaw pain.  Cholesterol: -Continue diet and exercise.  -Check cholesterol.   Pre-diabetes: -Continue diet and exercise.  -Check A1C  Vitamin D Def: -check level -continue medications.   Bipolar disorder -celexa -elavil -xanax -may get papers with disability  Continue diet and meds as discussed. Further disposition pending results of labs.  HPI 55 y.o. male  presents for 3 month follow up with hypertension, hyperlipidemia, prediabetes and vitamin D.   His blood pressure has been controlled at home, today their BP is BP: (!) 148/80 mmHg.   He does workout. He denies chest pain, shortness of breath, dizziness.  He has been walking a lot more and has been getting outside and doing some yardwork.   He is on cholesterol medication and denies myalgias. His cholesterol is at goal. The cholesterol last visit was:   Lab Results  Component Value Date   CHOL 181 04/01/2015   HDL 60 04/01/2015   LDLCALC 109 04/01/2015   TRIG 58 04/01/2015   CHOLHDL 3.0 04/01/2015     He has been working on diet and exercise for prediabetes, and denies foot ulcerations, hyperglycemia, hypoglycemia , increased appetite, nausea, paresthesia of the feet, polydipsia, polyuria, visual disturbances, vomiting and weight loss. Last A1C in the office was:  Lab Results  Component Value Date   HGBA1C 5.7* 04/01/2015    Patient is on Vitamin D supplement.  Lab Results  Component Value Date   VD25OH 39 04/01/2015     Patient reports that he has been taking his celexa and has also been taking elavil.  He reports that his bipolar has been okay lately.  He thinks that we may get some papers on his disability from the social security office.    He reports that his  psychiatrist has retired.  He needs a new one.     Current Medications:  Current Outpatient Prescriptions on File Prior to Visit  Medication Sig Dispense Refill  . albuterol (VENTOLIN HFA) 108 (90 BASE) MCG/ACT inhaler Inhale 2 puffs into the lungs every 4 (four) hours as needed for wheezing or shortness of breath. 1 Inhaler 2  . ALPRAZolam (XANAX) 1 MG tablet TAKE ONE-HALF TO ONE TABLET BY MOUTH THREE TIMES DAILY AS NEEDED 90 tablet 0  . amitriptyline (ELAVIL) 25 MG tablet TAKE ONE TABLET BY MOUTH 4 TIMES DAILY FOR MOOD 120 tablet 1  . citalopram (CELEXA) 40 MG tablet Take 1 tablet (40 mg total) by mouth daily. 90 tablet 0  . ibuprofen (ADVIL,MOTRIN) 200 MG tablet Take 800 mg by mouth every 6 (six) hours as needed for pain.    Marland Kitchen lisinopril-hydrochlorothiazide (PRINZIDE,ZESTORETIC) 20-25 MG tablet TAKE ONE TABLET BY MOUTH ONCE DAILY 90 tablet 0  . pravastatin (PRAVACHOL) 40 MG tablet Take 1 tablet (40 mg total) by mouth daily. 30 tablet 3   No current facility-administered medications on file prior to visit.    Medical History:  Past Medical History  Diagnosis Date  . Kidney stones   . Bipolar 1 disorder ([place])   . Hepatitis C   . Hypertension     Allergies:  Allergies  Allergen Reactions  . Codeine Itching     Review of Systems:  Review of Systems  Constitutional: Negative for fever, chills and malaise/fatigue.  HENT: Negative for congestion, ear pain and sore throat.   Eyes: Negative.   Respiratory: Negative for cough, shortness of breath and wheezing.   Cardiovascular: Negative for chest pain, palpitations and leg swelling.  Gastrointestinal: Negative for heartburn, abdominal pain, diarrhea, constipation, blood in stool and melena.  Genitourinary: Negative.   Skin: Negative.   Neurological: Negative for dizziness, sensory change, loss of consciousness and headaches.  Psychiatric/Behavioral: Negative for depression. The patient is not nervous/anxious and does not have  insomnia.     Family history- Review and unchanged  Social history- Review and unchanged  Physical Exam: BP 148/80 mmHg  Pulse 90  Temp(Src) 97.8 F (36.6 C) (Temporal)  Resp 16  Ht 5' 11.5" (1.816 m)  Wt 240 lb (108.863 kg)  BMI 33.01 kg/m2 Wt Readings from Last 3 Encounters:  07/30/15 240 lb (108.863 kg)  06/11/15 246 lb (111.585 kg)  04/01/15 237 lb (107.502 kg)    General Appearance: Well nourished well developed, in no apparent distress. Eyes: PERRLA, EOMs, conjunctiva no swelling or erythema ENT/Mouth: Ear canals normal without obstruction, swelling, erythma, discharge.  TMs normal bilaterally.  Oropharynx moist, clear, without exudate, or postoropharyngeal swelling. Neck: Supple, thyroid normal,no cervical adenopathy  Respiratory: Respiratory effort normal, Breath sounds clear A&P without rhonchi, wheeze, or rale.  No retractions, no accessory usage. Cardio: RRR with no MRGs. Brisk peripheral pulses without edema.  Abdomen: Soft, + BS,  Non tender, no guarding, rebound, hernias, masses. Musculoskeletal: Full ROM, 5/5 strength, Normal gait Skin: Warm, dry without rashes, lesions, ecchymosis.  Neuro: Awake and oriented X 3, Cranial nerves intact. Normal muscle tone, no cerebellar symptoms. Psych: Normal affect, Insight and Judgment appropriate.    Starlyn Skeans, PA-C 11:10 AM [facility]

## 2015-07-31 LAB — HEMOGLOBIN A1C
HEMOGLOBIN A1C: 5.7 % — AB (ref <5.7)
MEAN PLASMA GLUCOSE: 117 mg/dL — AB (ref <117)

## 2015-09-01 ENCOUNTER — Other Ambulatory Visit: Payer: Self-pay | Admitting: Internal Medicine

## 2015-10-15 ENCOUNTER — Other Ambulatory Visit: Payer: Self-pay | Admitting: Internal Medicine

## 2015-10-15 DIAGNOSIS — F419 Anxiety disorder, unspecified: Secondary | ICD-10-CM

## 2015-10-16 ENCOUNTER — Other Ambulatory Visit: Payer: Self-pay | Admitting: Internal Medicine

## 2015-10-16 DIAGNOSIS — F419 Anxiety disorder, unspecified: Secondary | ICD-10-CM

## 2015-10-16 MED ORDER — ALPRAZOLAM 1 MG PO TABS: ORAL_TABLET | ORAL | Status: DC

## 2015-10-30 ENCOUNTER — Ambulatory Visit: Payer: Self-pay | Admitting: Internal Medicine

## 2015-11-24 NOTE — Patient Instructions (Signed)

## 2015-11-25 ENCOUNTER — Encounter: Payer: Self-pay | Admitting: Internal Medicine

## 2015-11-25 ENCOUNTER — Ambulatory Visit (INDEPENDENT_AMBULATORY_CARE_PROVIDER_SITE_OTHER): Payer: Medicare Other | Admitting: Internal Medicine

## 2015-11-25 VITALS — BP 112/80 | HR 80 | Temp 97.5°F | Resp 16 | Ht 72.0 in | Wt 239.8 lb

## 2015-11-25 DIAGNOSIS — E782 Mixed hyperlipidemia: Secondary | ICD-10-CM

## 2015-11-25 DIAGNOSIS — I10 Essential (primary) hypertension: Secondary | ICD-10-CM

## 2015-11-25 DIAGNOSIS — E559 Vitamin D deficiency, unspecified: Secondary | ICD-10-CM | POA: Diagnosis not present

## 2015-11-25 DIAGNOSIS — R7309 Other abnormal glucose: Secondary | ICD-10-CM

## 2015-11-25 DIAGNOSIS — Z79899 Other long term (current) drug therapy: Secondary | ICD-10-CM | POA: Diagnosis not present

## 2015-11-25 LAB — BASIC METABOLIC PANEL WITH GFR
BUN: 13 mg/dL (ref 7–25)
CALCIUM: 9.4 mg/dL (ref 8.6–10.3)
CO2: 23 mmol/L (ref 20–31)
CREATININE: 0.89 mg/dL (ref 0.70–1.33)
Chloride: 101 mmol/L (ref 98–110)
GFR, Est African American: >89 mL/min (ref >=60)
GFR, Est Non African American: >89 mL/min (ref >=60)
Glucose, Bld: 120 mg/dL — ABNORMAL HIGH (ref 65–99)
Potassium: 4.2 mmol/L (ref 3.5–5.3)
SODIUM: 133 mmol/L — AB (ref 135–146)

## 2015-11-25 LAB — TSH: TSH: 0.8 mIU/L (ref 0.40–4.50)

## 2015-11-25 LAB — HEPATIC FUNCTION PANEL
ALT: 27 U/L (ref 9–46)
AST: 26 U/L (ref 10–35)
Albumin: 4.3 g/dL (ref 3.6–5.1)
Alkaline Phosphatase: 71 U/L (ref 40–115)
BILIRUBIN DIRECT: 0.1 mg/dL (ref <=0.2)
BILIRUBIN TOTAL: 0.3 mg/dL (ref 0.2–1.2)
Indirect Bilirubin: 0.2 mg/dL (ref 0.2–1.2)
Total Protein: 7 g/dL (ref 6.1–8.1)

## 2015-11-25 LAB — CBC WITH DIFFERENTIAL/PLATELET
BASOS ABS: 0 {cells}/uL (ref 0–200)
Basophils Relative: 0 %
EOS PCT: 2 %
Eosinophils Absolute: 136 cells/uL (ref 15–500)
HCT: 44.6 % (ref 38.5–50.0)
Hemoglobin: 15.6 g/dL (ref 13.2–17.1)
Lymphocytes Relative: 30 %
Lymphs Abs: 2040 cells/uL (ref 850–3900)
MCH: 31.8 pg (ref 27.0–33.0)
MCHC: 35 g/dL (ref 32.0–36.0)
MCV: 91 fL (ref 80.0–100.0)
MONOS PCT: 8 %
MPV: 10 fL (ref 7.5–12.5)
Monocytes Absolute: 544 cells/uL (ref 200–950)
NEUTROS PCT: 60 %
Neutro Abs: 4080 cells/uL (ref 1500–7800)
PLATELETS: 252 10*3/uL (ref 140–400)
RBC: 4.9 MIL/uL (ref 4.20–5.80)
RDW: 13.6 % (ref 11.0–15.0)
WBC: 6.8 10*3/uL (ref 3.8–10.8)

## 2015-11-25 LAB — LIPID PANEL
CHOL/HDL RATIO: 2.5 ratio (ref <=5.0)
CHOLESTEROL: 148 mg/dL (ref 125–200)
HDL: 60 mg/dL (ref >=40)
LDL Cholesterol: 79 mg/dL (ref <130)
TRIGLYCERIDES: 46 mg/dL (ref <150)
VLDL: 9 mg/dL (ref <30)

## 2015-11-25 LAB — HEMOGLOBIN A1C
HEMOGLOBIN A1C: 5.6 % (ref <5.7)
Mean Plasma Glucose: 114 mg/dL

## 2015-11-25 LAB — MAGNESIUM: MAGNESIUM: 2 mg/dL (ref 1.5–2.5)

## 2015-11-25 NOTE — Progress Notes (Signed)
Patient ID: Howard Fox, male   DOB: [birth year], 55 y.o.   MRN: [id number]  [facility] ADULT & ADOLESCENT INTERNAL MEDICINE                       Unk Pinto, M.D.        Uvaldo Bristle. Silverio Lay, P.A.-C       Starlyn Skeans, P.A.-C   [facility]                [address] [place], [place] [id number] Telephone [phone] Telefax [phone] ______________________________________________________________________     This very nice 55 y.o. SWM  presents for   follow up with Hypertension, Hyperlipidemia, Pre-Diabetes and Vitamin D Deficiency. Patient is on SS Disability since 2008  related to remote Multiple Trauma injury in an Industrial Accident falling in a grain silo sustaining multiple rib fx's, Pulmonary contusion and had emergency surgical repair of a perforated stomach and Colon. Consequently, patient has PTSD,  Panic Disorder and also has a diagnosis of Bipolar Disorder and is followed by Psychiatry.      Patient is treated for HTN & BP has been controlled at home. Today's BP: 112/80 mmHg. Patient has had no complaints of any cardiac type chest pain, palpitations, dyspnea/orthopnea/PND, dizziness, claudication, or dependent edema.      Hyperlipidemia is controlled with diet & meds. Patient denies myalgias or other med SE's. Last Lipids were 07/30/2015: Cholesterol 168; HDL 60; LDL Cholesterol 97; Triglycerides 53     Also, the patient has history of  Morbid Obesity  (BMI 32+) and consequent PreDiabetes with A1c 5.2% and concomitant elevated Insulin 46 suspect for Insulin Resistance and has had no symptoms of reactive hypoglycemia, diabetic polys, paresthesias or visual blurring.  Last A1c was 5.7% on  07/30/2015.     Further, the patient also has history of Vitamin D Deficiency  Of "42" on treatment in 2014  and supplements vitamin D without any suspected side-effects. Last vitamin D was  39 on   04/01/2015.  Medication Sig  . albuterol  HFA   inhaler Inhale 2 puffs into the lungs every 4 (four) hours as needed for wheezing or shortness of breath.  . ALPRAZolam  1 MG  TAKE ONE-HALF TO ONE TABLET BY MOUTH THREE TIMES DAILY AS NEEDED  . amitriptyline 25 MG  TAKE ONE TABLET BY MOUTH 4 TIMES DAILY FOR MOOD  . citalopram 40 MG  Take 1 tablet (40 mg total) by mouth daily.  Marland Kitchen ibuprofen  200 MG  Take 800 mg by mouth every 6 (six) hours as needed for pain.  Marland Kitchen lisinopril-hctz  20-25 MG  TAKE ONE TABLET BY MOUTH ONCE DAILY  . pravastatin 40 MG tablet Take 1 tablet (40 mg total) by mouth daily.   Allergies  Allergen Reactions  . Codeine Itching   PMHx:   Past Medical History  Diagnosis Date  . Kidney stones   . Bipolar 1 disorder ([place])   . Hepatitis C   . Hypertension    Immunization History  Administered Date(s) Administered  . DT 04/01/2015  . Td 05/23/2004   Past Surgical History  Procedure Laterality Date  . Cholecystectomy     FHx:    Reviewed / unchanged  SHx:    Reviewed / unchanged  Systems Review:  Constitutional: Denies fever, chills, wt changes, headaches, insomnia, fatigue, night sweats, change in  appetite. Eyes: Denies redness, blurred vision, diplopia, discharge, itchy, watery eyes.  ENT: Denies discharge, congestion, post nasal drip, epistaxis, sore throat, earache, hearing loss, dental pain, tinnitus, vertigo, sinus pain, snoring.  CV: Denies chest pain, palpitations, irregular heartbeat, syncope, dyspnea, diaphoresis, orthopnea, PND, claudication or edema. Respiratory: denies cough, dyspnea, DOE, pleurisy, hoarseness, laryngitis, wheezing.  Gastrointestinal: Denies dysphagia, odynophagia, heartburn, reflux, water brash, abdominal pain or cramps, nausea, vomiting, bloating, diarrhea, constipation, hematemesis, melena, hematochezia  or hemorrhoids. Genitourinary: Denies dysuria, frequency, urgency, nocturia, hesitancy, discharge, hematuria or flank pain. Musculoskeletal: Denies arthralgias, myalgias, stiffness,  jt. swelling, pain, limping or strain/sprain.  Skin: Denies pruritus, rash, hives, warts, acne, eczema or change in skin lesion(s). Neuro: No weakness, tremor, incoordination, spasms, paresthesia or pain. Psychiatric: Denies confusion, memory loss or sensory loss. Endo: Denies change in weight, skin or hair change.  Heme/Lymph: No excessive bleeding, bruising or enlarged lymph nodes.  Physical Exam  BP 112/80   Pulse 80  Temp97.5 F  Resp 16  Ht 6'  Wt 239 lb 12.8 oz     BMI 32.52   Appears  over nourished and in no distress. Eyes: PERRLA, EOMs, conjunctiva no swelling or erythema. Sinuses: No frontal/maxillary tenderness ENT/Mouth: EAC's clear, TM's nl w/o erythema, bulging. Nares clear w/o erythema, swelling, exudates. Oropharynx clear without erythema or exudates. Oral hygiene is good. Tongue normal, non obstructing. Hearing intact.  Neck: Supple. Thyroid nl. Car 2+/2+ without bruits, nodes or JVD. Chest: Respirations nl with BS clear & equal w/o rales, rhonchi, wheezing or stridor.  Cor: Heart sounds normal w/ regular rate and rhythm without sig. murmurs, gallops, clicks, or rubs. Peripheral pulses normal and equal  without edema.  Abdomen: Soft & bowel sounds normal. Non-tender w/o guarding, rebound, hernias, masses, or organomegaly.  Lymphatics: Unremarkable.  Musculoskeletal: Full ROM all peripheral extremities, joint stability, 5/5 strength, and normal gait.  Skin: Warm, dry without exposed rashes, lesions or ecchymosis apparent.  Neuro: Cranial nerves intact, reflexes equal bilaterally. Sensory-motor testing grossly intact. Tendon reflexes grossly intact.  Pysch: Alert & oriented x 3.  Insight and judgement nl & appropriate. No ideations.  Assessment and Plan:  1. Essential hypertension  -Continue medication, monitor blood pressure at home. Continue DASH diet. Reminder to go to the ER if any CP, SOB, nausea, dizziness, severe HA, changes vision/speech, left arm numbness  and tingling and jaw pain. - TSH  2. Mixed hyperlipidemia   - Continue diet/meds, exercise,& lifestyle modifications. Continue monitor periodic cholesterol/liver & renal functions  - Lipid panel - TSH  3. Other abnormal glucose  - Continue diet, exercise, lifestyle modifications. Monitor appropriate labs. - Hemoglobin A1c - Insulin, random  4. Vitamin D deficiency  - Continue supplementation. - VITAMIN D 25 Hydroxy   5. Medication management  - CBC with Differential/Platelet - BASIC METABOLIC PANEL WITH GFR - Hepatic function panel - Magnesium   Recommended regular exercise, BP monitoring, weight control, and discussed med and SE's. Recommended labs to assess and monitor clinical status. Further disposition pending results of labs. Over 30 minutes of exam, counseling, chart review was performed

## 2015-11-26 LAB — INSULIN, RANDOM: Insulin: 51.1 u[IU]/mL — ABNORMAL HIGH (ref 2.0–19.6)

## 2015-11-26 LAB — VITAMIN D 25 HYDROXY (VIT D DEFICIENCY, FRACTURES): VIT D 25 HYDROXY: 37 ng/mL (ref 30–100)

## 2015-12-09 ENCOUNTER — Encounter: Payer: Self-pay | Admitting: Internal Medicine

## 2016-02-02 ENCOUNTER — Other Ambulatory Visit: Payer: Self-pay | Admitting: Internal Medicine

## 2016-03-02 ENCOUNTER — Other Ambulatory Visit: Payer: Self-pay | Admitting: Internal Medicine

## 2016-03-20 ENCOUNTER — Other Ambulatory Visit: Payer: Self-pay | Admitting: Internal Medicine

## 2016-03-21 ENCOUNTER — Ambulatory Visit: Payer: Self-pay | Admitting: Physician Assistant

## 2016-04-20 ENCOUNTER — Other Ambulatory Visit: Payer: Self-pay | Admitting: Internal Medicine

## 2016-04-20 DIAGNOSIS — F419 Anxiety disorder, unspecified: Secondary | ICD-10-CM

## 2016-04-20 NOTE — Telephone Encounter (Signed)
Please call alprazolam 

## 2016-04-20 NOTE — Telephone Encounter (Signed)
Please call Alprazolam 

## 2016-04-26 DIAGNOSIS — H2513 Age-related nuclear cataract, bilateral: Secondary | ICD-10-CM | POA: Diagnosis not present

## 2016-04-28 ENCOUNTER — Encounter: Payer: Self-pay | Admitting: Internal Medicine

## 2016-05-14 ENCOUNTER — Encounter (HOSPITAL_COMMUNITY): Payer: Self-pay

## 2016-05-14 ENCOUNTER — Emergency Department (HOSPITAL_COMMUNITY)
Admission: EM | Admit: 2016-05-14 | Discharge: 2016-05-14 | Disposition: A | Discharge status: Home / Self Care | Payer: Medicare Other | Attending: Emergency Medicine | Admitting: Emergency Medicine

## 2016-05-14 DIAGNOSIS — F17200 Nicotine dependence, unspecified, uncomplicated: Secondary | ICD-10-CM | POA: Insufficient documentation

## 2016-05-14 DIAGNOSIS — R109 Unspecified abdominal pain: Secondary | ICD-10-CM | POA: Insufficient documentation

## 2016-05-14 DIAGNOSIS — I10 Essential (primary) hypertension: Secondary | ICD-10-CM | POA: Diagnosis not present

## 2016-05-14 DIAGNOSIS — M545 Low back pain: Secondary | ICD-10-CM | POA: Insufficient documentation

## 2016-05-14 LAB — COMPREHENSIVE METABOLIC PANEL
ALK PHOS: 75 U/L (ref 38–126)
ALT: 26 U/L (ref 17–63)
ANION GAP: 8 (ref 5–15)
AST: 28 U/L (ref 15–41)
Albumin: 3.9 g/dL (ref 3.5–5.0)
BILIRUBIN TOTAL: 0.7 mg/dL (ref 0.3–1.2)
BUN: 14 mg/dL (ref 6–20)
CALCIUM: 9.9 mg/dL (ref 8.9–10.3)
CO2: 25 mmol/L (ref 22–32)
CREATININE: 0.9 mg/dL (ref 0.61–1.24)
Chloride: 103 mmol/L (ref 101–111)
Glucose, Bld: 95 mg/dL (ref 65–99)
Potassium: 4.1 mmol/L (ref 3.5–5.1)
Sodium: 136 mmol/L (ref 135–145)
Total Protein: 7.5 g/dL (ref 6.5–8.1)

## 2016-05-14 LAB — CBC
HCT: 46.4 % (ref 39.0–52.0)
HEMOGLOBIN: 15.8 g/dL (ref 13.0–17.0)
MCH: 31.2 pg (ref 26.0–34.0)
MCHC: 34.1 g/dL (ref 30.0–36.0)
MCV: 91.7 fL (ref 78.0–100.0)
PLATELETS: 244 10*3/uL (ref 150–400)
RBC: 5.06 MIL/uL (ref 4.22–5.81)
RDW: 13.1 % (ref 11.5–15.5)
WBC: 12.6 10*3/uL — AB (ref 4.0–10.5)

## 2016-05-14 LAB — LIPASE, BLOOD: Lipase: 19 U/L (ref 11–51)

## 2016-05-14 LAB — URINALYSIS, ROUTINE W REFLEX MICROSCOPIC
Bilirubin Urine: NEGATIVE
Glucose, UA: NEGATIVE mg/dL
HGB URINE DIPSTICK: NEGATIVE
Ketones, ur: NEGATIVE mg/dL
LEUKOCYTES UA: NEGATIVE
NITRITE: NEGATIVE
PROTEIN: NEGATIVE mg/dL
Specific Gravity, Urine: 1.023 (ref 1.005–1.030)
pH: 5 (ref 5.0–8.0)

## 2016-05-14 MED ORDER — CYCLOBENZAPRINE HCL 10 MG PO TABS: 10.0000 mg | ORAL_TABLET | Freq: Two times a day (BID) | ORAL | 0 refills | Status: DC | PRN

## 2016-05-14 MED ORDER — KETOROLAC TROMETHAMINE 15 MG/ML IJ SOLN
15.0000 mg | Freq: Once | INTRAMUSCULAR | Status: DC
Start: 2016-05-14 — End: 2016-05-14

## 2016-05-14 MED ORDER — OXYCODONE-ACETAMINOPHEN 5-325 MG PO TABS
ORAL_TABLET | ORAL | Status: AC
  Filled 2016-05-14: qty 1

## 2016-05-14 MED ORDER — OXYCODONE-ACETAMINOPHEN 5-325 MG PO TABS
1.0000 | ORAL_TABLET | ORAL | Status: DC | PRN
  Administered 2016-05-14: 1 via ORAL

## 2016-05-14 MED ORDER — TAMSULOSIN HCL 0.4 MG PO CAPS: 0.4000 mg | ORAL_CAPSULE | Freq: Every day | ORAL | 0 refills | Status: AC

## 2016-05-14 MED ORDER — KETOROLAC TROMETHAMINE 15 MG/ML IJ SOLN
30.0000 mg | Freq: Once | INTRAMUSCULAR | Status: AC
  Administered 2016-05-14: 30 mg via INTRAMUSCULAR
  Filled 2016-05-14: qty 2

## 2016-05-14 MED ORDER — METHOCARBAMOL 500 MG PO TABS
750.0000 mg | ORAL_TABLET | Freq: Once | ORAL | Status: AC
  Administered 2016-05-14: 750 mg via ORAL
  Filled 2016-05-14: qty 2

## 2016-05-14 NOTE — ED Provider Notes (Signed)
[facility] DEPT Provider Note   CSN: [id number] Arrival date & time: 05/14/16  1528     History   Chief Complaint Chief Complaint  Patient presents with  . Flank Pain    HPI Howard Fox is a 55 y.o. male. Here with Right flank pain  The history is provided by the patient.  Flank Pain  This is a recurrent problem. The current episode started 12 to 24 hours ago. The problem occurs constantly. Progression since onset: fluctuating. Pertinent negatives include no chest pain, no abdominal pain, no headaches and no shortness of breath. The symptoms are aggravated by twisting and bending. Nothing relieves the symptoms. Treatments tried: nsaids. The treatment provided mild relief.   Extensive PMH of renal stones. Recently passed large stone 2 weeks ago.  She notes a reports performing some strenuous activity 2 days ago.  Past Medical History:  Diagnosis Date  . Bipolar 1 disorder ([place])   . Hepatitis C   . Hypertension   . Kidney stones     Patient Active Problem List   Diagnosis Date Noted  . Hepatitis C, chronic ([place]) 04/01/2015  . Tobacco use disorder 04/01/2015  . Other abnormal glucose 12/10/2014  . Morbid obesity (BMI 32.14) 12/10/2014  . Medication management 12/03/2013  . Essential hypertension 04/25/2013  . Mixed hyperlipidemia 04/25/2013  . Vitamin D deficiency 04/25/2013  . Bipolar disorder ([place]) 02/19/2007    Past Surgical History:  Procedure Laterality Date  . CHOLECYSTECTOMY         Home Medications    Prior to Admission medications   Medication Sig Start Date End Date Taking? Authorizing Provider  albuterol (VENTOLIN HFA) 108 (90 BASE) MCG/ACT inhaler Inhale 2 puffs into the lungs every 4 (four) hours as needed for wheezing or shortness of breath. 04/01/15   Vicie Mutters, PA-C  ALPRAZolam Duanne Moron) 1 MG tablet TAKE ONE-HALF TO ONE TABLET BY MOUTH THREE TIMES DAILY AS NEEDED 04/20/16   Unk Pinto, MD  amitriptyline (ELAVIL) 25 MG tablet  TAKE ONE TABLET BY MOUTH 4 TIMES DAILY FOR MOOD 04/01/15   Vicie Mutters, PA-C  amitriptyline (ELAVIL) 25 MG tablet TAKE ONE TABLET BY MOUTH 4 TIMES DAILY FOR  MOOD 02/03/16   Unk Pinto, MD  citalopram (CELEXA) 40 MG tablet TAKE ONE TABLET BY MOUTH ONCE DAILY 04/20/16   Unk Pinto, MD  cyclobenzaprine (FLEXERIL) 10 MG tablet Take 1 tablet (10 mg total) by mouth 2 (two) times daily as needed for muscle spasms. 05/14/16   Fatima Blank, MD  ibuprofen (ADVIL,MOTRIN) 200 MG tablet Take 800 mg by mouth every 6 (six) hours as needed for pain.    Historical Provider, MD  lisinopril-hydrochlorothiazide (PRINZIDE,ZESTORETIC) 20-25 MG tablet TAKE ONE TABLET BY MOUTH ONCE DAILY 03/02/16   Unk Pinto, MD  pravastatin (PRAVACHOL) 40 MG tablet TAKE ONE TABLET BY MOUTH ONCE DAILY 03/20/16   Courtney Forcucci, PA-C  tamsulosin (FLOMAX) 0.4 MG CAPS capsule Take 1 capsule (0.4 mg total) by mouth daily. 05/14/16 05/21/16  Fatima Blank, MD    Family History Family History  Problem Relation Age of Onset  . Mental illness Mother     Bipolar  . Stroke Father   . Arthritis Father     Social History Social History  Substance Use Topics  . Smoking status: Current Every Day Smoker    Packs/day: 0.50  . Smokeless tobacco: Never Used  . Alcohol use 1.2 oz/week    2 Cans of beer per week  Allergies   Codeine   Review of Systems Review of Systems  Constitutional: Negative for chills and fever.  HENT: Negative for ear pain and sore throat.   Eyes: Negative for pain and visual disturbance.  Respiratory: Negative for cough and shortness of breath.   Cardiovascular: Negative for chest pain and palpitations.  Gastrointestinal: Negative for abdominal pain and vomiting.  Genitourinary: Positive for flank pain. Negative for dysuria and hematuria.  Musculoskeletal: Negative for arthralgias and back pain.  Skin: Negative for color change and rash.  Neurological: Negative for  seizures, syncope and headaches.  All other systems reviewed and are negative.    Physical Exam Updated Vital Signs BP 123/75   Pulse 84   Temp 98 F (36.7 C) (Oral)   Resp 15   Ht 6' (1.829 m)   Wt 226 lb (102.5 kg)   SpO2 98%   BMI 30.65 kg/m   Physical Exam  Constitutional: He is oriented to person, place, and time. He appears well-developed and well-nourished. No distress.  HENT:  Head: Normocephalic and atraumatic.  Nose: Nose normal.  Eyes: Conjunctivae and EOM are normal. Pupils are equal, round, and reactive to light. Right eye exhibits no discharge. Left eye exhibits no discharge. No scleral icterus.  Neck: Normal range of motion. Neck supple.  Cardiovascular: Normal rate and regular rhythm.  Exam reveals no gallop and no friction rub.   No murmur heard. Pulmonary/Chest: Effort normal and breath sounds normal. No stridor. No respiratory distress. He has no rales.  Abdominal: Soft. He exhibits no distension. There is no tenderness.  Musculoskeletal: He exhibits no edema.       Lumbar back: He exhibits tenderness. He exhibits no bony tenderness.       Back:  Neurological: He is alert and oriented to person, place, and time.  Skin: Skin is warm and dry. No rash noted. He is not diaphoretic. No erythema.  Psychiatric: He has a normal mood and affect.  Vitals reviewed.    ED Treatments / Results  Labs (all labs ordered are listed, but only abnormal results are displayed) Labs Reviewed  CBC - Abnormal; Notable for the following:       Result Value   WBC 12.6 (*)    All other components within normal limits  LIPASE, BLOOD  COMPREHENSIVE METABOLIC PANEL  URINALYSIS, ROUTINE W REFLEX MICROSCOPIC    EKG  EKG Interpretation None       Radiology No results found.  Procedures Procedures (including critical care time) Emergency Focused Ultrasound Exam Limited Retroperitoneal Ultrasound of Kidneys and Bladder  Performed and interpreted by Dr.  Leonette Monarch Focused abdominal ultrasound with both kidneys and bladder imaged in transverse and longitudinal planes in real-time. Indication: flank pain Findings: bilateral kidneys present, no shadowing, no anechoic areas Interpretation: no hydronephrosis visualized.  no stones or cysts visualized  Images archived electronically  CPT Code: [phone]   Medications Ordered in ED Medications  oxyCODONE-acetaminophen (PERCOCET/ROXICET) 5-325 MG per tablet 1 tablet (1 tablet Oral Given 05/14/16 1546)  methocarbamol (ROBAXIN) tablet 750 mg (750 mg Oral Given 05/14/16 2121)  ketorolac (TORADOL) 15 MG/ML injection 30 mg (30 mg Intramuscular Given 05/14/16 2122)     Initial Impression / Assessment and Plan / ED Course  I have reviewed the triage vital signs and the nursing notes.  Pertinent labs & imaging results that were available during my care of the patient were reviewed by me and considered in my medical decision making (see chart for details).  Clinical  Course     Presentation is most consistent with right sacroiliac muscle strain/spasm. Patient does have significant past medical history with renal stones however UA without evidence of hematuria; no evidence of UTI. Cannot rule out small renal stone. However, Bedside ultrasound without evidence of hydronephrosis. Screening labs obtained were grossly reassuring. Leukocytosis likely secondary to the margination from stress/pain.   Significant improvement in patient's symptomatology with IM Toradol and by mouth Robaxin.  Final Clinical Impressions(s) / ED Diagnoses   Final diagnoses:  Right flank pain   Disposition: Discharge  Condition: Good  I have discussed the results, Dx and Tx plan with the patient who expressed understanding and agree(s) with the plan. Discharge instructions discussed at great length. The patient was given strict return precautions who verbalized understanding of the instructions. No further questions at time of  discharge.    Discharge Medication List as of 05/14/2016 10:12 PM    START taking these medications   Details  cyclobenzaprine (FLEXERIL) 10 MG tablet Take 1 tablet (10 mg total) by mouth 2 (two) times daily as needed for muscle spasms., Starting Sat 05/14/2016, Print    tamsulosin (FLOMAX) 0.4 MG CAPS capsule Take 1 capsule (0.4 mg total) by mouth daily., Starting Sat 05/14/2016, Until Sat 05/21/2016, Print        Follow Up: Unk Pinto, MD [address] [place] [address] [phone]  Schedule an appointment as soon as possible for a visit  in 5-7 days, If symptoms do not improve or  worsen  Urology  Schedule an appointment as soon as possible for a visit  As needed      Fatima Blank, MD 05/15/16 [phone]

## 2016-05-14 NOTE — ED Notes (Signed)
Pt reports he can take Percocet.

## 2016-05-14 NOTE — ED Triage Notes (Signed)
Onset yesterday afternoon right flank and right abd pain.  H/o kidney stones.  C/o dysuria.  No fever or blood in urine.

## 2016-05-17 ENCOUNTER — Encounter (HOSPITAL_COMMUNITY): Payer: Self-pay | Admitting: Emergency Medicine

## 2016-05-17 ENCOUNTER — Emergency Department (HOSPITAL_COMMUNITY)
Admission: EM | Admit: 2016-05-17 | Discharge: 2016-05-18 | Disposition: A | Discharge status: Home / Self Care | Payer: Medicare Other | Attending: Emergency Medicine | Admitting: Emergency Medicine

## 2016-05-17 ENCOUNTER — Emergency Department (HOSPITAL_COMMUNITY): Payer: Medicare Other

## 2016-05-17 DIAGNOSIS — Z79899 Other long term (current) drug therapy: Secondary | ICD-10-CM | POA: Diagnosis not present

## 2016-05-17 DIAGNOSIS — F17200 Nicotine dependence, unspecified, uncomplicated: Secondary | ICD-10-CM | POA: Insufficient documentation

## 2016-05-17 DIAGNOSIS — I10 Essential (primary) hypertension: Secondary | ICD-10-CM | POA: Insufficient documentation

## 2016-05-17 DIAGNOSIS — M5441 Lumbago with sciatica, right side: Secondary | ICD-10-CM | POA: Diagnosis not present

## 2016-05-17 DIAGNOSIS — M545 Low back pain: Secondary | ICD-10-CM | POA: Diagnosis present

## 2016-05-17 DIAGNOSIS — Z7982 Long term (current) use of aspirin: Secondary | ICD-10-CM | POA: Insufficient documentation

## 2016-05-17 DIAGNOSIS — M5136 Other intervertebral disc degeneration, lumbar region: Secondary | ICD-10-CM | POA: Diagnosis not present

## 2016-05-17 LAB — COMPREHENSIVE METABOLIC PANEL
ALT: 24 U/L (ref 17–63)
ANION GAP: 9 (ref 5–15)
AST: 28 U/L (ref 15–41)
Albumin: 3.7 g/dL (ref 3.5–5.0)
Alkaline Phosphatase: 67 U/L (ref 38–126)
BILIRUBIN TOTAL: 0.5 mg/dL (ref 0.3–1.2)
BUN: 11 mg/dL (ref 6–20)
CALCIUM: 9.3 mg/dL (ref 8.9–10.3)
CO2: 26 mmol/L (ref 22–32)
Chloride: 100 mmol/L — ABNORMAL LOW (ref 101–111)
Creatinine, Ser: 0.89 mg/dL (ref 0.61–1.24)
Glucose, Bld: 98 mg/dL (ref 65–99)
Potassium: 3.9 mmol/L (ref 3.5–5.1)
SODIUM: 135 mmol/L (ref 135–145)
TOTAL PROTEIN: 7.8 g/dL (ref 6.5–8.1)

## 2016-05-17 LAB — CBC WITH DIFFERENTIAL/PLATELET
BASOS ABS: 0 10*3/uL (ref 0.0–0.1)
BASOS PCT: 0 %
Eosinophils Absolute: 0.1 10*3/uL (ref 0.0–0.7)
Eosinophils Relative: 1 %
HEMATOCRIT: 43.6 % (ref 39.0–52.0)
HEMOGLOBIN: 14.8 g/dL (ref 13.0–17.0)
Lymphocytes Relative: 18 %
Lymphs Abs: 1.7 10*3/uL (ref 0.7–4.0)
MCH: 30.2 pg (ref 26.0–34.0)
MCHC: 33.9 g/dL (ref 30.0–36.0)
MCV: 89 fL (ref 78.0–100.0)
Monocytes Absolute: 1.1 10*3/uL — ABNORMAL HIGH (ref 0.1–1.0)
Monocytes Relative: 12 %
NEUTROS ABS: 6.7 10*3/uL (ref 1.7–7.7)
NEUTROS PCT: 69 %
Platelets: 218 10*3/uL (ref 150–400)
RBC: 4.9 MIL/uL (ref 4.22–5.81)
RDW: 12.8 % (ref 11.5–15.5)
WBC: 9.6 10*3/uL (ref 4.0–10.5)

## 2016-05-17 LAB — URINALYSIS, ROUTINE W REFLEX MICROSCOPIC
BILIRUBIN URINE: NEGATIVE
Glucose, UA: NEGATIVE mg/dL
HGB URINE DIPSTICK: NEGATIVE
Ketones, ur: NEGATIVE mg/dL
Leukocytes, UA: NEGATIVE
Nitrite: NEGATIVE
Protein, ur: NEGATIVE mg/dL
SPECIFIC GRAVITY, URINE: 1.012 (ref 1.005–1.030)
pH: 5 (ref 5.0–8.0)

## 2016-05-17 MED ORDER — METHOCARBAMOL 500 MG PO TABS
1000.0000 mg | ORAL_TABLET | Freq: Once | ORAL | Status: AC
Start: 2016-05-17 — End: 2016-05-17
  Administered 2016-05-17: 1000 mg via ORAL
  Filled 2016-05-17: qty 2

## 2016-05-17 MED ORDER — ONDANSETRON HCL 4 MG/2ML IJ SOLN
4.0000 mg | Freq: Once | INTRAMUSCULAR | Status: AC
  Administered 2016-05-17: 4 mg via INTRAVENOUS
  Filled 2016-05-17: qty 2

## 2016-05-17 MED ORDER — NAPROXEN 500 MG PO TABS: 500.0000 mg | ORAL_TABLET | Freq: Two times a day (BID) | ORAL | 0 refills | Status: DC

## 2016-05-17 MED ORDER — HYDROCODONE-ACETAMINOPHEN 5-325 MG PO TABS: 1.0000 | ORAL_TABLET | Freq: Four times a day (QID) | ORAL | 0 refills | Status: DC | PRN

## 2016-05-17 MED ORDER — METHYLPREDNISOLONE SODIUM SUCC 125 MG IJ SOLR
125.0000 mg | Freq: Once | INTRAMUSCULAR | Status: AC
  Administered 2016-05-17: 125 mg via INTRAVENOUS
  Filled 2016-05-17: qty 2

## 2016-05-17 MED ORDER — MORPHINE SULFATE (PF) 4 MG/ML IV SOLN
4.0000 mg | Freq: Once | INTRAVENOUS | Status: AC
  Administered 2016-05-17: 4 mg via INTRAVENOUS
  Filled 2016-05-17: qty 1

## 2016-05-17 MED ORDER — CARISOPRODOL 350 MG PO TABS: 350.0000 mg | ORAL_TABLET | Freq: Four times a day (QID) | ORAL | 0 refills | Status: DC | PRN

## 2016-05-17 MED ORDER — LIDOCAINE 5 % EX PTCH: 1.0000 | MEDICATED_PATCH | CUTANEOUS | 0 refills | Status: DC

## 2016-05-17 MED ORDER — KETOROLAC TROMETHAMINE 30 MG/ML IJ SOLN
30.0000 mg | Freq: Once | INTRAMUSCULAR | Status: AC
  Administered 2016-05-17: 30 mg via INTRAVENOUS
  Filled 2016-05-17: qty 1

## 2016-05-17 MED ORDER — PREDNISONE 10 MG (21) PO TBPK: ORAL_TABLET | ORAL | 0 refills | Status: DC

## 2016-05-17 NOTE — ED Triage Notes (Signed)
Pt c/o right flank pain, bilateral leg pain, RLQ abdominal pain onset Thursday. Bilateral leg numbness. Pain prevents weight bearing. Seen and evaluated for the same on 05/14/16 at [facility]. No bowel or bladder changes.

## 2016-05-17 NOTE — ED Notes (Signed)
Pt made aware that urine sample is needed.Pt states he is unable to urinate at time.

## 2016-05-17 NOTE — Discharge Instructions (Signed)
Take it easy, but do not lay around too much as this may make any stiffness worse. Take 500 mg of naproxen every 12 hours or 800 mg of ibuprofen every 8 hours for the next 3 days. Take these medications with food to avoid upset stomach. Howard Fox is a muscle relaxer and may help loosen stiff muscles. Do not take the [place] while driving or performing other dangerous activities. Be sure to perform the attached exercises starting with three times a week and working up to performing them daily. This is an essential part of preventing long term problems. Follow up with a primary care provider for any future management of these complaints. There were no acute abnormalities on the xray today. Should you require further imaging, please have your PCP order these as needed.

## 2016-05-17 NOTE — ED Notes (Signed)
Pt called to recheck vitals but pt did not answer

## 2016-05-17 NOTE — ED Provider Notes (Signed)
[facility] DEPT Provider Note   CSN: [id number] Arrival date & time: 05/17/16  1324  By signing my name below, I, Dora Sims, attest that this documentation has been prepared under the direction and in the presence of Shawn Joy, PA-C. Electronically Signed: Dora Sims, Scribe. 05/17/2016. 9:03 PM.  History   Chief Complaint Chief Complaint  Patient presents with  . Back Pain    The history is provided by the patient. No language interpreter was used.     HPI Comments: Howard Fox is a 55 y.o. male with PMHx significant for kidney stoneswho presents to the Emergency Department complaining of sudden onset, constant, severe, 10/10 right lower back pain beginning 4 days ago. He reports associated bilateral lower extremity pain and states he cannot stand upright for more than a few minutes due muscle spasms. He notes he was seen at [facility] 3 days ago for his back pain and was diagnosed with a muscle spasm of his right flank; he had an ultrasound performed that was negative for kidney stones. He called his PCP PTA and was told he may have a herniated disc or a pinched nerve and was advised to come to the ER. No recent trauma to his back, falls, or injuries. Denies fever/chills, nausea/vomiting, urinary complaints, neuro deficits, changes in bowel or bladder function, or any other complaints.   Past Medical History:  Diagnosis Date  . Bipolar 1 disorder ([place])   . Hepatitis C   . Hypertension   . Kidney stones     Patient Active Problem List   Diagnosis Date Noted  . Hepatitis C, chronic ([place]) 04/01/2015  . Tobacco use disorder 04/01/2015  . Other abnormal glucose 12/10/2014  . Morbid obesity (BMI 32.14) 12/10/2014  . Medication management 12/03/2013  . Essential hypertension 04/25/2013  . Mixed hyperlipidemia 04/25/2013  . Vitamin D deficiency 04/25/2013  . Bipolar disorder ([place]) 02/19/2007    Past Surgical History:  Procedure Laterality Date  .  CHOLECYSTECTOMY         Home Medications    Prior to Admission medications   Medication Sig Start Date End Date Taking? Authorizing Provider  albuterol (VENTOLIN HFA) 108 (90 BASE) MCG/ACT inhaler Inhale 2 puffs into the lungs every 4 (four) hours as needed for wheezing or shortness of breath. 04/01/15  Yes Vicie Mutters, PA-C  ALPRAZolam Duanne Moron) 1 MG tablet TAKE ONE-HALF TO ONE TABLET BY MOUTH THREE TIMES DAILY AS NEEDED 04/20/16  Yes Unk Pinto, MD  amitriptyline (ELAVIL) 25 MG tablet TAKE ONE TABLET BY MOUTH 4 TIMES DAILY FOR MOOD 04/01/15  Yes Vicie Mutters, PA-C  aspirin EC 325 MG tablet Take 650 mg by mouth daily.   Yes Historical Provider, MD  citalopram (CELEXA) 40 MG tablet TAKE ONE TABLET BY MOUTH ONCE DAILY 04/20/16  Yes Unk Pinto, MD  cyclobenzaprine (FLEXERIL) 10 MG tablet Take 1 tablet (10 mg total) by mouth 2 (two) times daily as needed for muscle spasms. 05/14/16  Yes Fatima Blank, MD  ibuprofen (ADVIL,MOTRIN) 200 MG tablet Take 800 mg by mouth every 8 (eight) hours as needed for moderate pain.    Yes Historical Provider, MD  lisinopril-hydrochlorothiazide (PRINZIDE,ZESTORETIC) 20-25 MG tablet TAKE ONE TABLET BY MOUTH ONCE DAILY 03/02/16  Yes Unk Pinto, MD  pravastatin (PRAVACHOL) 40 MG tablet TAKE ONE TABLET BY MOUTH ONCE DAILY 03/20/16  Yes Courtney Forcucci, PA-C  carisoprodol (SOMA) 350 MG tablet Take 1 tablet (350 mg total) by mouth 4 (four) times daily as needed  for muscle spasms. 05/17/16   Shawn C Joy, PA-C  HYDROcodone-acetaminophen (NORCO/VICODIN) 5-325 MG tablet Take 1-2 tablets by mouth every 6 (six) hours as needed. 05/17/16   Shawn C Joy, PA-C  lidocaine (LIDODERM) 5 % Place 1 patch onto the skin daily. Remove & Discard patch within 12 hours or as directed by MD 05/17/16   Helane Gunther Joy, PA-C  naproxen (NAPROSYN) 500 MG tablet Take 1 tablet (500 mg total) by mouth 2 (two) times daily. 05/17/16   Shawn C Joy, PA-C  predniSONE (STERAPRED  UNI-PAK 21 TAB) 10 MG (21) TBPK tablet Take 6 tabs day 1, 5 tabs day 2, 4 tabs day 3, 3 tabs day 4, 2 tabs day 5, and 1 tab on day 6. 05/17/16   Shawn C Joy, PA-C  tamsulosin (FLOMAX) 0.4 MG CAPS capsule Take 1 capsule (0.4 mg total) by mouth daily. Patient not taking: Reported on 05/17/2016 05/14/16 05/21/16  Fatima Blank, MD    Family History Family History  Problem Relation Age of Onset  . Mental illness Mother     Bipolar  . Stroke Father   . Arthritis Father     Social History Social History  Substance Use Topics  . Smoking status: Current Every Day Smoker    Packs/day: 0.50  . Smokeless tobacco: Never Used  . Alcohol use 1.2 oz/week    2 Cans of beer per week     Allergies   Codeine   Review of Systems Review of Systems  Constitutional: Negative for chills and fever.  Respiratory: Negative for shortness of breath.   Cardiovascular: Negative for chest pain.  Gastrointestinal: Negative for abdominal pain, constipation, diarrhea, nausea and vomiting.       Negative for bowel incontinence.  Genitourinary: Negative for difficulty urinating, dysuria and hematuria.       Negative for bladder incontinence.  Musculoskeletal: Positive for back pain (right lower) and myalgias (bilateral lower extremities).  Neurological: Negative for weakness and numbness.  All other systems reviewed and are negative.    Physical Exam Updated Vital Signs BP 133/83 (BP Location: Left Arm)   Pulse 89   Temp 100 F (37.8 C) (Oral)   Resp 18   SpO2 100%   Physical Exam  Constitutional: He appears well-developed and well-nourished. No distress.  HENT:  Head: Normocephalic and atraumatic.  Eyes: Conjunctivae are normal.  Neck: Neck supple.  Cardiovascular: Normal rate, regular rhythm, normal heart sounds and intact distal pulses.   Pulmonary/Chest: Effort normal and breath sounds normal. No respiratory distress.  Abdominal: Soft. There is no tenderness. There is no guarding.   Musculoskeletal: He exhibits tenderness. He exhibits no edema.  Tenderness to right lumbar musculature and midline lumbar spine. No other midline spinal tenderness. Motor function intact in both lower extremities. Patient with hesitancy extending the right leg due to pain.  Neurological: He is alert.  No sensory deficits. Strength 5/5 in bilateral lower extremities. Coordination intact. No gait deficit.  Skin: Skin is warm and dry. He is not diaphoretic.  Psychiatric: He has a normal mood and affect. His behavior is normal.  Nursing note and vitals reviewed.    ED Treatments / Results  Labs (all labs ordered are listed, but only abnormal results are displayed) Labs Reviewed  COMPREHENSIVE METABOLIC PANEL - Abnormal; Notable for the following:       Result Value   Chloride 100 (*)    All other components within normal limits  CBC WITH DIFFERENTIAL/PLATELET - Abnormal; Notable  for the following:    Monocytes Absolute 1.1 (*)    All other components within normal limits  URINALYSIS, ROUTINE W REFLEX MICROSCOPIC    EKG  EKG Interpretation None       Radiology Dg Lumbar Spine Complete  Result Date: 05/17/2016 CLINICAL DATA:  Acute onset of right flank pain and right lower quadrant abdominal pain. Bilateral leg numbness. Initial encounter. EXAM: LUMBAR SPINE - COMPLETE 4+ VIEW COMPARISON:  CT of the abdomen and pelvis from 02/09/2015 FINDINGS: There is no evidence of fracture or subluxation. Vertebral bodies demonstrate normal alignment. There is stable chronic loss of height at vertebral body T11. Intervertebral disc spaces are preserved, aside from mild disc space narrowing and endplate sclerosis at 075-GRM, similar in appearance to the prior study. The visualized neural foramina are grossly unremarkable in appearance. The visualized bowel gas pattern is unremarkable in appearance; air and stool are noted within the colon. The sacroiliac joints are within normal limits. Clips are noted  within the right upper quadrant, reflecting prior cholecystectomy. Postoperative change is noted along the left lower posterior ribs. IMPRESSION: No evidence of fracture or subluxation along the lumbar spine. Chronic stable loss of height at T11. Electronically Signed   By: Garald Balding M.D.   On: 05/17/2016 21:41    Procedures Procedures (including critical care time)  DIAGNOSTIC STUDIES: Oxygen Saturation is 97% on RA, normal by my interpretation.    COORDINATION OF CARE: 9:08 PM Discussed treatment plan with pt at bedside and pt agreed to plan.  Medications Ordered in ED Medications  morphine 4 MG/ML injection 4 mg (4 mg Intravenous Given 05/17/16 2213)  ondansetron (ZOFRAN) injection 4 mg (4 mg Intravenous Given 05/17/16 2213)  methocarbamol (ROBAXIN) tablet 1,000 mg (1,000 mg Oral Given 05/17/16 2212)  ketorolac (TORADOL) 30 MG/ML injection 30 mg (30 mg Intravenous Given 05/17/16 2213)  morphine 4 MG/ML injection 4 mg (4 mg Intravenous Given 05/17/16 2300)  methylPREDNISolone sodium succinate (SOLU-MEDROL) 125 mg/2 mL injection 125 mg (125 mg Intravenous Given 05/17/16 2300)     Initial Impression / Assessment and Plan / ED Course  I have reviewed the triage vital signs and the nursing notes.  Pertinent labs & imaging results that were available during my care of the patient were reviewed by me and considered in my medical decision making (see chart for details).  Clinical Course     Patient presents with lower right back pain in a sciatica distribution. No neuro or functional deficits. No red flag symptoms. No symptoms that would give concern for cauda equina.  10:40 PM Pt states his pain is now a 3/10 and he feels much better overall.  The patient was given instructions for home care as well as return precautions. Patient voices understanding of these instructions, accepts the plan, and is comfortable with discharge. Recommended PCP follow-up for continued  evaluation.  Vitals:   05/17/16 1654 05/17/16 1825 05/17/16 2107 05/18/16 0021  BP: 115/67 139/62 133/83 122/79  Pulse: 91 (!) 58 89 80  Resp: 13 16 18 18   Temp:   100 F (37.8 C)   TempSrc:   Oral   SpO2: 96% 97% 100% 92%     Final Clinical Impressions(s) / ED Diagnoses   Final diagnoses:  Acute right-sided low back pain with right-sided sciatica    New Prescriptions Discharge Medication List as of 05/17/2016 11:32 PM    START taking these medications   Details  carisoprodol (SOMA) 350 MG tablet Take 1 tablet (350  mg total) by mouth 4 (four) times daily as needed for muscle spasms., Starting Tue 05/17/2016, Print    HYDROcodone-acetaminophen (NORCO/VICODIN) 5-325 MG tablet Take 1-2 tablets by mouth every 6 (six) hours as needed., Starting Tue 05/17/2016, Print    lidocaine (LIDODERM) 5 % Place 1 patch onto the skin daily. Remove & Discard patch within 12 hours or as directed by MD, Starting Tue 05/17/2016, Print    naproxen (NAPROSYN) 500 MG tablet Take 1 tablet (500 mg total) by mouth 2 (two) times daily., Starting Tue 05/17/2016, Print    predniSONE (STERAPRED UNI-PAK 21 TAB) 10 MG (21) TBPK tablet Take 6 tabs day 1, 5 tabs day 2, 4 tabs day 3, 3 tabs day 4, 2 tabs day 5, and 1 tab on day 6., Print      I personally performed the services described in this documentation, which was scribed in my presence. The recorded information has been reviewed and is accurate.   Lorayne Bender, PA-C 05/18/16 0116    Lacretia Leigh, MD 05/22/16 [phone]

## 2016-05-26 DIAGNOSIS — M545 Low back pain: Secondary | ICD-10-CM | POA: Diagnosis not present

## 2016-05-30 DIAGNOSIS — M545 Low back pain: Secondary | ICD-10-CM | POA: Diagnosis not present

## 2016-05-31 DIAGNOSIS — M545 Low back pain: Secondary | ICD-10-CM | POA: Diagnosis not present

## 2016-06-06 DIAGNOSIS — M5441 Lumbago with sciatica, right side: Secondary | ICD-10-CM | POA: Diagnosis not present

## 2016-06-06 DIAGNOSIS — M5442 Lumbago with sciatica, left side: Secondary | ICD-10-CM | POA: Diagnosis not present

## 2016-06-10 DIAGNOSIS — M5441 Lumbago with sciatica, right side: Secondary | ICD-10-CM | POA: Diagnosis not present

## 2016-06-22 ENCOUNTER — Other Ambulatory Visit: Payer: Self-pay | Admitting: Internal Medicine

## 2016-06-22 ENCOUNTER — Encounter: Payer: Self-pay | Admitting: Internal Medicine

## 2016-06-22 ENCOUNTER — Ambulatory Visit (INDEPENDENT_AMBULATORY_CARE_PROVIDER_SITE_OTHER): Payer: Medicare Other | Admitting: Internal Medicine

## 2016-06-22 VITALS — BP 116/80 | HR 88 | Temp 97.3°F | Resp 16 | Ht 71.5 in | Wt 242.0 lb

## 2016-06-22 DIAGNOSIS — F3170 Bipolar disorder, currently in remission, most recent episode unspecified: Secondary | ICD-10-CM | POA: Diagnosis not present

## 2016-06-22 DIAGNOSIS — Z1211 Encounter for screening for malignant neoplasm of colon: Secondary | ICD-10-CM

## 2016-06-22 DIAGNOSIS — Z125 Encounter for screening for malignant neoplasm of prostate: Secondary | ICD-10-CM

## 2016-06-22 DIAGNOSIS — E559 Vitamin D deficiency, unspecified: Secondary | ICD-10-CM | POA: Diagnosis not present

## 2016-06-22 DIAGNOSIS — R7303 Prediabetes: Secondary | ICD-10-CM | POA: Diagnosis not present

## 2016-06-22 DIAGNOSIS — Z79899 Other long term (current) drug therapy: Secondary | ICD-10-CM | POA: Diagnosis not present

## 2016-06-22 DIAGNOSIS — E782 Mixed hyperlipidemia: Secondary | ICD-10-CM | POA: Diagnosis not present

## 2016-06-22 DIAGNOSIS — Z1212 Encounter for screening for malignant neoplasm of rectum: Secondary | ICD-10-CM

## 2016-06-22 DIAGNOSIS — R7309 Other abnormal glucose: Secondary | ICD-10-CM | POA: Diagnosis not present

## 2016-06-22 DIAGNOSIS — Z136 Encounter for screening for cardiovascular disorders: Secondary | ICD-10-CM | POA: Diagnosis not present

## 2016-06-22 DIAGNOSIS — N320 Bladder-neck obstruction: Secondary | ICD-10-CM | POA: Diagnosis not present

## 2016-06-22 DIAGNOSIS — I10 Essential (primary) hypertension: Secondary | ICD-10-CM

## 2016-06-22 LAB — CBC WITH DIFFERENTIAL/PLATELET
BASOS ABS: 55 {cells}/uL (ref 0–200)
Basophils Relative: 1 %
EOS ABS: 55 {cells}/uL (ref 15–500)
Eosinophils Relative: 1 %
HCT: 46.3 % (ref 38.5–50.0)
Hemoglobin: 15.7 g/dL (ref 13.2–17.1)
LYMPHS PCT: 37 %
Lymphs Abs: 2035 cells/uL (ref 850–3900)
MCH: 30.3 pg (ref 27.0–33.0)
MCHC: 33.9 g/dL (ref 32.0–36.0)
MCV: 89.2 fL (ref 80.0–100.0)
MONOS PCT: 14 %
MPV: 10 fL (ref 7.5–12.5)
Monocytes Absolute: 770 cells/uL (ref 200–950)
Neutro Abs: 2585 cells/uL (ref 1500–7800)
Neutrophils Relative %: 47 %
PLATELETS: 194 10*3/uL (ref 140–400)
RBC: 5.19 MIL/uL (ref 4.20–5.80)
RDW: 13.6 % (ref 11.0–15.0)
WBC: 5.5 10*3/uL (ref 3.8–10.8)

## 2016-06-22 LAB — HEMOGLOBIN A1C
HEMOGLOBIN A1C: 5.5 % (ref <5.7)
MEAN PLASMA GLUCOSE: 111 mg/dL

## 2016-06-22 LAB — TSH: TSH: 1.75 m[IU]/L (ref 0.40–4.50)
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
## 2016-06-22 NOTE — Patient Instructions (Signed)

## 2016-06-23 LAB — LIPID PANEL
Cholesterol: 166 mg/dL (ref <200)
HDL: 46 mg/dL (ref >40)
LDL Cholesterol: 100 mg/dL — ABNORMAL HIGH (ref <100)
Total CHOL/HDL Ratio: 3.6 Ratio (ref <5.0)
Triglycerides: 100 mg/dL (ref <150)
VLDL: 20 mg/dL (ref <30)

## 2016-06-23 LAB — URINALYSIS, ROUTINE W REFLEX MICROSCOPIC

## 2016-06-23 LAB — HEPATIC FUNCTION PANEL
ALBUMIN: 4.1 g/dL (ref 3.6–5.1)
ALT: 31 U/L (ref 9–46)
AST: 30 U/L (ref 10–35)
Alkaline Phosphatase: 72 U/L (ref 40–115)
BILIRUBIN TOTAL: 0.4 mg/dL (ref 0.2–1.2)
Bilirubin, Direct: 0.1 mg/dL (ref <=0.2)
Indirect Bilirubin: 0.3 mg/dL (ref 0.2–1.2)
TOTAL PROTEIN: 7.3 g/dL (ref 6.1–8.1)

## 2016-06-23 LAB — INSULIN, RANDOM: INSULIN: 14.5 u[IU]/mL (ref 2.0–19.6)

## 2016-06-23 LAB — BASIC METABOLIC PANEL WITH GFR
BUN: 13 mg/dL (ref 7–25)
CHLORIDE: 104 mmol/L (ref 98–110)
CO2: 24 mmol/L (ref 20–31)
CREATININE: 1.01 mg/dL (ref 0.70–1.33)
Calcium: 9.6 mg/dL (ref 8.6–10.3)
GFR, Est Non African American: 83 mL/min (ref >=60)
Glucose, Bld: 95 mg/dL (ref 65–99)
Potassium: 4 mmol/L (ref 3.5–5.3)
Sodium: 141 mmol/L (ref 135–146)

## 2016-06-23 LAB — VITAMIN D 25 HYDROXY (VIT D DEFICIENCY, FRACTURES): VIT D 25 HYDROXY: 40 ng/mL (ref 30–100)

## 2016-06-23 LAB — MAGNESIUM: MAGNESIUM: 1.8 mg/dL (ref 1.5–2.5)

## 2016-06-23 LAB — PSA: PSA: 0.9 ng/mL (ref <=4.0)

## 2016-07-19 ENCOUNTER — Other Ambulatory Visit: Payer: Self-pay | Admitting: Internal Medicine

## 2016-07-19 DIAGNOSIS — F419 Anxiety disorder, unspecified: Secondary | ICD-10-CM

## 2016-07-19 NOTE — Telephone Encounter (Signed)
Please call Alprazolam 

## 2016-08-01 ENCOUNTER — Other Ambulatory Visit: Payer: Self-pay | Admitting: *Deleted

## 2016-08-01 MED ORDER — PRAVASTATIN SODIUM 40 MG PO TABS: 40.0000 mg | ORAL_TABLET | Freq: Every day | ORAL | 0 refills | Status: DC

## 2016-08-05 DIAGNOSIS — S92405A Nondisplaced unspecified fracture of left great toe, initial encounter for closed fracture: Secondary | ICD-10-CM | POA: Diagnosis not present

## 2016-08-18 ENCOUNTER — Other Ambulatory Visit: Payer: Self-pay | Admitting: Internal Medicine

## 2016-08-18 ENCOUNTER — Other Ambulatory Visit: Payer: Self-pay | Admitting: Physician Assistant

## 2016-09-29 ENCOUNTER — Ambulatory Visit: Payer: Self-pay | Admitting: Physician Assistant

## 2016-09-29 ENCOUNTER — Ambulatory Visit: Payer: Self-pay | Admitting: Internal Medicine

## 2016-10-04 NOTE — Progress Notes (Signed)
MEDICARE ANNUAL WELLNESS VISIT AND FOLLOW UP Assessment:  \ Essential hypertension - continue medications, DASH diet, exercise and monitor at home. Call if greater than 130/80.  - DG Chest 2 View; Future - EKG 12-Lead - CBC with Differential/Platelet - BASIC METABOLIC PANEL WITH GFR - Hepatic function panel - TSH   Abnormal glucose Discussed general issues about diabetes pathophysiology and management., Educational material distributed., Suggested low cholesterol diet., Encouraged aerobic exercise., Discussed foot care., Reminded to get yearly retinal exam. - Hemoglobin A1c   Morbid obesity, unspecified obesity type ([place]) Obesity with co morbidities- long discussion about weight loss, diet, and exercise   Mixed hyperlipidemia -continue medications, check lipids, decrease fatty foods, increase activity.  - Lipid panel  Vitamin D deficiency - Vit D  25 hydroxy (rtn osteoporosis monitoring)  Medication management - Magnesium   Bipolar disorder in full remission, most recent episode unspecified type ([place]) Controlled on medications   Chronic hepatitis C without hepatic coma ([place]) Since has insurance will refer to Hep C clinic, last labs were in 2014, showed elevated Hep C titer - will recheck Quantitative but patient declined - Need to go still  Tobacco use disorder Smoking cessation-  instruction/counseling given, counseled patient on the dangers of tobacco use, advised patient to stop smoking, and reviewed strategies to maximize success, patient not ready to quit at this time.  - DG Chest 2 View; Future  Screening for rectal cancer - willing to do cologuard  Over 30 minutes of exam, counseling, chart review, and critical decision making was performed Future Appointments Date Time Provider [place]  01/03/2017 11:30 AM Unk Pinto, MD [facility] None  05/30/2017 2:00 PM Unk Pinto, MD [facility] None  08/01/2017 3:00 PM Unk Pinto, MD [facility] None     Plan:   During the course of the visit the patient was educated and counseled about appropriate screening and preventive services including:    Pneumococcal vaccine   Influenza vaccine  Prevnar 13  Td vaccine  Screening electrocardiogram  Colorectal cancer screening  Diabetes screening  Glaucoma screening  Nutrition counseling   Subjective:  Howard Fox is a 56 y.o. male who presents for Medicare Annual Wellness Visit and 3 month follow up for HTN, hyperlipidemia, prediabetes, and vitamin D Def.  Has been on disability since 2008.  His blood pressure has been controlled at home, runs 120-130/70's at home, he is on lisinopril every other day, today their BP is BP: 126/84  Had left toenail come off due to nail fungus, does not want oral medication, no pain, warmth, swelling.  He does workout, does yard work. He denies chest pain, shortness of breath, dizziness.  He is on cholesterol medication, pravastatin every other day and denies myalgias. His cholesterol is at goal. The cholesterol last visit was:   Lab Results  Component Value Date   CHOL 166 06/22/2016   HDL 46 06/22/2016   LDLCALC 100 (H) 06/22/2016   TRIG 100 06/22/2016   CHOLHDL 3.6 06/22/2016   He has been working on diet and exercise for prediabetes, and denies paresthesia of the feet, polydipsia, polyuria and visual disturbances. Last A1C in the office was:  Lab Results  Component Value Date   HGBA1C 5.5 06/22/2016  Patient is on Vitamin D supplement.   Lab Results  Component Value Date   VD25OH 40 06/22/2016   Treated for Hep c in 2008.  Lab Results  Component Value Date   ALT 31 06/22/2016   AST 30 06/22/2016  ALKPHOS 72 06/22/2016   BILITOT 0.4 06/22/2016   Has history of bipolar/mood DO, on celexa and xanax only, denies any bipolar manic depression episodes.  BMI is Body mass index is 32.79 kg/m., he is working on diet and exercise. Has lost weight, doing well with avoiding sugars.   Wt Readings from Last 3 Encounters:  10/06/16 238 lb 6.4 oz (108.1 kg)  06/22/16 242 lb (109.8 kg)  05/14/16 226 lb (102.5 kg)    Medication Review: Current Outpatient Prescriptions on File Prior to Visit  Medication Sig Dispense Refill  . ALPRAZolam (XANAX) 1 MG tablet TAKE ONE-HALF TO ONE TABLET BY MOUTH THREE TIMES DAILY AS NEEDED 270 tablet 0  . amitriptyline (ELAVIL) 25 MG tablet TAKE ONE TABLET BY MOUTH 4 TIMES DAILY FOR MOOD 120 tablet 1  . aspirin EC 325 MG tablet Take 650 mg by mouth daily.    . citalopram (CELEXA) 40 MG tablet TAKE ONE TABLET BY MOUTH ONCE DAILY 90 tablet 0  . ibuprofen (ADVIL,MOTRIN) 200 MG tablet Take 800 mg by mouth every 8 (eight) hours as needed for moderate pain.     Marland Kitchen lisinopril-hydrochlorothiazide (PRINZIDE,ZESTORETIC) 20-25 MG tablet TAKE ONE TABLET BY MOUTH ONCE DAILY 90 tablet 1  . pravastatin (PRAVACHOL) 40 MG tablet Take 1 tablet (40 mg total) by mouth daily. 90 tablet 0  . VENTOLIN HFA 108 (90 Base) MCG/ACT inhaler INHALE TWO PUFFS BY MOUTH EVERY 4 HOURS AS NEEDED FOR WHEEZING AND FOR SHORTNESS OF BREATH 18 each 2   No current facility-administered medications on file prior to visit.     Current Problems (verified) Patient Active Problem List   Diagnosis Date Noted  . Hepatitis C, chronic ([place]) 04/01/2015  . Tobacco use disorder 04/01/2015  . Other abnormal glucose 12/10/2014  . Morbid obesity (BMI 32.14) 12/10/2014  . Medication management 12/03/2013  . Essential hypertension 04/25/2013  . Mixed hyperlipidemia 04/25/2013  . Vitamin D deficiency 04/25/2013  . Bipolar disorder ([place]) 02/19/2007    Screening Tests Immunization History  Administered Date(s) Administered  . DT 04/01/2015  . Td 05/23/2004    Preventative care: Last colonoscopy: DUE will set up cologuard Stress test 2006 neg CXR 2010 [place] AB 2010  Prior vaccinations: TD or Tdap: 2016  Influenza: declines Pneumococcal: N/A Prevnar13: N/A Shingles/Zostavax:  N/A  Names of Other Physician/Practitioners you currently use: 1. [facility] here for primary care 2. [facility], Dr. [facility] in [place] eye doctor, last visit 10 months ago 3. Dentures, Patient Care Team: Unk Pinto, MD as PCP - General (Internal Medicine) Nickie Retort, MD as Consulting Physician (Urology)  Allergies Allergies  Allergen Reactions  . Codeine Itching    SURGICAL HISTORY He  has a past surgical history that includes Cholecystectomy. FAMILY HISTORY His family history includes Arthritis in his father; Mental illness in his mother; Stroke in his father. SOCIAL HISTORY He  reports that he has been smoking.  He has been smoking about 0.50 packs per day. He has never used smokeless tobacco. He reports that he drinks about 1.2 oz of alcohol per week . He reports that he does not use drugs.  MEDICARE WELLNESS OBJECTIVES: Physical activity: Current Exercise Habits: The patient does not participate in regular exercise at present, Exercise limited by: orthopedic condition(s);psychological condition(s) Cardiac risk factors: Cardiac Risk Factors include: advanced age (>38men, >24 women);dyslipidemia;hypertension;male gender;sedentary lifestyle;obesity (BMI >30kg/m2);smoking/ tobacco exposure Depression/mood screen:   Depression screen [facility] 2/9 10/06/2016  Decreased Interest 0  Down, Depressed, Hopeless 0  PHQ - 2 Score 0    ADLs:  In your present state of health, do you have any difficulty performing the following activities: 06/22/2016 11/25/2015  Hearing? N N  Vision? N N  Difficulty concentrating or making decisions? N N  Walking or climbing stairs? N N  Dressing or bathing? N N  Doing errands, shopping? N N  Some recent data might be hidden     Cognitive Testing  Alert? Yes  Normal Appearance?Yes  Oriented to person? Yes  Place? Yes   Time? Yes  Recall of three objects?  Yes  Can perform simple calculations?  Yes  Displays appropriate judgment?Yes  Can read the correct time from a watch face?Yes  EOL planning: Does Patient Have a Medical Advance Directive?: Yes Type of Advance Directive: Healthcare Power of Attorney, Living will Copy of [place] in Chart?: No - copy requested   Objective:   Today's Vitals   10/06/16 0951  BP: 126/84  Pulse: 68  Resp: 16  Temp: 97.9 F (36.6 C)  SpO2: 98%  Weight: 238 lb 6.4 oz (108.1 kg)  Height: 5' 11.5" (1.816 m)  PainSc: 0-No pain   Body mass index is 32.79 kg/m.  General appearance: alert, no distress, WD/WN, male HEENT: normocephalic, sclerae anicteric, TMs pearly, nares patent, no discharge or erythema, pharynx normal Oral cavity: MMM, no lesions Neck: supple, no lymphadenopathy, no thyromegaly, no masses Heart: RRR, normal S1, S2, no murmurs Lungs: CTA bilaterally, no wheezes, rhonchi, or rales Abdomen: well healing vertical scar, left sided ventral hernia, obese, +bs, soft, non tender, non distended, no masses, no hepatomegaly, no splenomegaly Musculoskeletal: nontender, no swelling, no obvious deformity Extremities: no edema, no cyanosis, no clubbing, left great toe with new nail growing, mild thickening at the end with healthy nail at the matrix Pulses: 2+ symmetric, upper and lower extremities, normal cap refill Neurological: alert, oriented x 3, CN2-12 intact, strength normal upper extremities and lower extremities, sensation normal throughout, DTRs 2+ throughout, no cerebellar signs, gait antaglic Psychiatric: normal affect, behavior normal, pleasant   Medicare Attestation I have personally reviewed: The patient's medical and social history Their use of alcohol, tobacco or illicit drugs Their current medications and supplements The patient's functional ability including ADLs,fall risks, home safety risks, cognitive, and hearing and visual impairment Diet and physical activities Evidence for depression or mood  disorders  The patient's weight, height, BMI, and visual acuity have been recorded in the chart.  I have made referrals, counseling, and provided education to the patient based on review of the above and I have provided the patient with a written personalized care plan for preventive services.     Vicie Mutters, PA-C   10/06/2016

## 2016-10-06 ENCOUNTER — Encounter: Payer: Self-pay | Admitting: Physician Assistant

## 2016-10-06 ENCOUNTER — Ambulatory Visit (INDEPENDENT_AMBULATORY_CARE_PROVIDER_SITE_OTHER): Payer: Medicare Other | Admitting: Physician Assistant

## 2016-10-06 VITALS — BP 126/84 | HR 68 | Temp 97.9°F | Resp 16 | Ht 71.5 in | Wt 238.4 lb

## 2016-10-06 DIAGNOSIS — Z79899 Other long term (current) drug therapy: Secondary | ICD-10-CM | POA: Diagnosis not present

## 2016-10-06 DIAGNOSIS — B182 Chronic viral hepatitis C: Secondary | ICD-10-CM

## 2016-10-06 DIAGNOSIS — E559 Vitamin D deficiency, unspecified: Secondary | ICD-10-CM | POA: Diagnosis not present

## 2016-10-06 DIAGNOSIS — D649 Anemia, unspecified: Secondary | ICD-10-CM

## 2016-10-06 DIAGNOSIS — R7309 Other abnormal glucose: Secondary | ICD-10-CM

## 2016-10-06 DIAGNOSIS — F17200 Nicotine dependence, unspecified, uncomplicated: Secondary | ICD-10-CM | POA: Diagnosis not present

## 2016-10-06 DIAGNOSIS — Z0000 Encounter for general adult medical examination without abnormal findings: Secondary | ICD-10-CM

## 2016-10-06 DIAGNOSIS — Z0001 Encounter for general adult medical examination with abnormal findings: Secondary | ICD-10-CM | POA: Diagnosis not present

## 2016-10-06 DIAGNOSIS — E782 Mixed hyperlipidemia: Secondary | ICD-10-CM

## 2016-10-06 DIAGNOSIS — R6889 Other general symptoms and signs: Secondary | ICD-10-CM | POA: Diagnosis not present

## 2016-10-06 DIAGNOSIS — I10 Essential (primary) hypertension: Secondary | ICD-10-CM | POA: Diagnosis not present

## 2016-10-06 DIAGNOSIS — F3170 Bipolar disorder, currently in remission, most recent episode unspecified: Secondary | ICD-10-CM | POA: Diagnosis not present

## 2016-10-06 LAB — BASIC METABOLIC PANEL WITH GFR
BUN: 9 mg/dL (ref 7–25)
CALCIUM: 9.4 mg/dL (ref 8.6–10.3)
CO2: 21 mmol/L (ref 20–31)
Chloride: 105 mmol/L (ref 98–110)
Creat: 0.83 mg/dL (ref 0.70–1.33)
GFR, Est Non African American: >89 mL/min (ref >=60)
GLUCOSE: 120 mg/dL — AB (ref 65–99)
Potassium: 4.4 mmol/L (ref 3.5–5.3)
SODIUM: 139 mmol/L (ref 135–146)

## 2016-10-06 LAB — CBC WITH DIFFERENTIAL/PLATELET
Basophils Absolute: 0 cells/uL (ref 0–200)
Basophils Relative: 0 %
EOS ABS: 138 {cells}/uL (ref 15–500)
Eosinophils Relative: 2 %
HEMATOCRIT: 45.8 % (ref 38.5–50.0)
HEMOGLOBIN: 15.4 g/dL (ref 13.2–17.1)
LYMPHS ABS: 2001 {cells}/uL (ref 850–3900)
LYMPHS PCT: 29 %
MCH: 31.1 pg (ref 27.0–33.0)
MCHC: 33.6 g/dL (ref 32.0–36.0)
MCV: 92.5 fL (ref 80.0–100.0)
MONO ABS: 552 {cells}/uL (ref 200–950)
MPV: 10 fL (ref 7.5–12.5)
Monocytes Relative: 8 %
Neutro Abs: 4209 cells/uL (ref 1500–7800)
Neutrophils Relative %: 61 %
PLATELETS: 241 10*3/uL (ref 140–400)
RBC: 4.95 MIL/uL (ref 4.20–5.80)
RDW: 13.8 % (ref 11.0–15.0)
WBC: 6.9 10*3/uL (ref 3.8–10.8)

## 2016-10-06 LAB — HEPATIC FUNCTION PANEL
ALT: 28 U/L (ref 9–46)
AST: 33 U/L (ref 10–35)
Albumin: 3.9 g/dL (ref 3.6–5.1)
Alkaline Phosphatase: 75 U/L (ref 40–115)
BILIRUBIN TOTAL: 0.3 mg/dL (ref 0.2–1.2)
Bilirubin, Direct: 0.1 mg/dL (ref <=0.2)
Indirect Bilirubin: 0.2 mg/dL (ref 0.2–1.2)
TOTAL PROTEIN: 6.6 g/dL (ref 6.1–8.1)

## 2016-10-06 LAB — IRON AND TIBC
%SAT: 22 % (ref 15–60)
Iron: 80 ug/dL (ref 50–180)
TIBC: 369 ug/dL (ref 250–425)
UIBC: 289 ug/dL

## 2016-10-06 LAB — LIPID PANEL
CHOLESTEROL: 183 mg/dL (ref <200)
HDL: 59 mg/dL (ref >40)
LDL Cholesterol: 110 mg/dL — ABNORMAL HIGH (ref <100)
TRIGLYCERIDES: 71 mg/dL (ref <150)
Total CHOL/HDL Ratio: 3.1 Ratio (ref <5.0)
VLDL: 14 mg/dL (ref <30)

## 2016-10-06 LAB — TSH: TSH: 0.57 mIU/L (ref 0.40–4.50)

## 2016-10-06 NOTE — Patient Instructions (Addendum)
Cologuard is an easy to use noninvasive colon cancer screening test based on the latest advances in stool DNA science.   Colon cancer is 3rd most diagnosed cancer and 2nd leading cause of death in both men and women 55 years of age and older despite being one of the most preventable and treatable cancers if found early.  4 of out 5 people diagnosed with colon cancer have NO prior family history.  When caught EARLY 90% of colon cancer is curable.   You have agreed to do a Cologuard screening and have declined a colonoscopy in spite of being explained the risks and benefits of the colonoscopy in detail, including cancer and death. Please understand that this is test not as sensitive or specific as a colonoscopy and you are still recommended to get a colonoscopy.   If you are NOT medicare please call your insurance company and give them these items to see if they will cover it: 1) CPT code, [phone] 2) Provider is Probation officer 3) [employer] NPI [id number] 4) [place] Tax ID [phone]  Out-of-pocket cost for Cologuard can range from $0 - $649 so please call  You will receive a short call from [place] support center at [employer], when you receive a call they will say they are from [place],  to confirm your mailing address and give you more information.  When they calll you, it will appear on the caller ID as "[employer]" or in some cases only this number will appear, [phone].   [employer] will ship your collection kit directly to you. You will collect a single stool sample in the privacy of your own home, no special preparation required. You will return the kit via [place] pre-paid shipping or pick-up, in the same box it arrived in. Then I will contact you to discuss your results after I receive them from the laboratory.   If you have any questions or concerns, Cologuard Customer Support Specialist are available 24 hours a  day, 7 days a week at [phone] or go to [email or web address].      Hepatitis C Hepatitis C is a viral infection of the liver. It can lead to scarring of the liver (cirrhosis), liver failure, or liver cancer. Hepatitis C may go undetected for months or years because people with the infection may not have symptoms, or they may have only mild symptoms. What are the causes? Hepatitis C is caused by the hepatitis C virus (HCV). The virus can be passed from one person to another through:  Blood.  Contaminated needles, such as those used for tattooing, body piercing, acupuncture, or injecting drugs.  Having unprotected sex with an infected person.  Childbirth.  Blood transfusions or organ transplants done in the [place] before 1992. What increases the risk? Risk factors for hepatitis C include:  Having unprotected sex with an infected person.  Using illegal drugs. What are the signs or symptoms? Symptoms of hepatitis C may include:  Fatigue.  Loss of appetite.  Nausea.  Vomiting.  Abdominal pain.  Dark yellow urine.  Yellowish skin and eyes (jaundice).  Itching of the skin.  Clay-colored bowel movements.  Joint pain. Symptoms are not always present. How is this diagnosed? Hepatitis C is diagnosed with blood tests. Other types of tests may also be done to check how your liver is functioning. How is this treated? Your health care provider may perform noninvasive tests or a liver biopsy to help determine the best  course of treatment. Treatment for hepatitis C may include one or more medicines. Your health care provider may check you for a recurring infection or other liver conditions every 6-12 months after treatment. Follow these instructions at home:  Rest as needed.  Take all medicines as directed by your health care provider.  Do not take any medicine unless approved by your health care provider. This includes over-the-counter medicine and birth  control pills.  Do not drink alcohol.  Do not have sex until approved by your health care provider.  Do not share toothbrushes, nail clippers, razors, or needles. How is this prevented? There is no vaccine for hepatitis C. The only way to prevent the disease is to reduce the risk of exposure to the virus. This may be done by:  Practicing safe sex and using condoms.  Avoiding illegal drugs. Contact a health care provider if:  You have a fever.  You develop abdominal pain.  You develop dark urine.  You have clay-colored bowel movements.  You develop joint pains. Get help right away if:  You have increasing fatigue or weakness.  You lose your appetite.  You feel nauseous or vomit.  You develop jaundice or your jaundice gets worse.  You bruise or bleed easily. This information is not intended to replace advice given to you by your health care provider. Make sure you discuss any questions you have with your health care provider. Document Released: 05/06/2000 Document Revised: 10/15/2015 Document Reviewed: 08/21/2013 Elsevier Interactive Patient Education  2017 [employer].   Simple math prevails.    1st - exercise does not produce significant weight loss - at best one converts fat into muscle , "bulks up", loses inches, but usually stays "weight neutral"     2nd - think of your body weightas a check book: If you eat more calories than you burn up - you save money or gain weight .... Or if you spend more money than you put in the check book, ie burn up more calories than you eat, then you lose weight     3rd - if you walk or run 1 mile, you burn up 100 calories - you have to burn up 3,500 calories to lose 1 pound, ie you have to walk/run 35 miles to lose 1 measly pound. So if you want to lose 10 #, then you have to walk/run 350 miles, so.... clearly exercise is not the solution.     4. So if you consume 1,500 calories, then you have to burn up the equivalent of 15 miles to  stay weight neutral - It also stands to reason that if you consume 1,500 cal/day and don't lose weight, then you must be burning up about 1,500 cals/day to stay weight neutral.     5. If you really want to lose weight, you must cut your calorie intake 300 calories /day and at that rate you should lose about 1 # every 3 days.   6. Please purchase Dr Fara Olden Fuhrman's book(s) "The End of Dieting" & "Eat to Live" . It has some great concepts and recipes.

## 2016-10-07 LAB — HEMOGLOBIN A1C
Hgb A1c MFr Bld: 5.5 % (ref <5.7)
Mean Plasma Glucose: 111 mg/dL

## 2016-10-07 LAB — MAGNESIUM: MAGNESIUM: 2 mg/dL (ref 1.5–2.5)

## 2016-10-07 LAB — FERRITIN: FERRITIN: 50 ng/mL (ref 20–380)

## 2016-10-07 LAB — VITAMIN B12: VITAMIN B 12: 247 pg/mL (ref 200–1100)

## 2016-10-07 NOTE — Progress Notes (Signed)
LVM for pt to return office call for LAB results.

## 2016-10-10 NOTE — Progress Notes (Signed)
Pt aware of lab results & voiced understanding of those results.

## 2016-10-12 ENCOUNTER — Other Ambulatory Visit: Payer: Self-pay | Admitting: Internal Medicine

## 2016-10-12 DIAGNOSIS — F419 Anxiety disorder, unspecified: Secondary | ICD-10-CM

## 2016-10-12 NOTE — Telephone Encounter (Signed)
Please call Alpraz  

## 2016-10-13 ENCOUNTER — Other Ambulatory Visit: Payer: Self-pay | Admitting: Internal Medicine

## 2016-10-13 DIAGNOSIS — F419 Anxiety disorder, unspecified: Secondary | ICD-10-CM

## 2016-10-26 DIAGNOSIS — Z1212 Encounter for screening for malignant neoplasm of rectum: Secondary | ICD-10-CM | POA: Diagnosis not present

## 2016-10-26 DIAGNOSIS — Z1211 Encounter for screening for malignant neoplasm of colon: Secondary | ICD-10-CM | POA: Diagnosis not present

## 2016-11-02 LAB — COLOGUARD

## 2016-12-13 ENCOUNTER — Encounter: Payer: Self-pay | Admitting: Internal Medicine

## 2017-01-03 ENCOUNTER — Ambulatory Visit (INDEPENDENT_AMBULATORY_CARE_PROVIDER_SITE_OTHER): Payer: Medicare Other | Admitting: Internal Medicine

## 2017-01-03 ENCOUNTER — Encounter: Payer: Self-pay | Admitting: Internal Medicine

## 2017-01-03 VITALS — BP 136/86 | HR 72 | Temp 97.5°F | Resp 18 | Ht 71.5 in | Wt 231.8 lb

## 2017-01-03 DIAGNOSIS — E559 Vitamin D deficiency, unspecified: Secondary | ICD-10-CM

## 2017-01-03 DIAGNOSIS — I10 Essential (primary) hypertension: Secondary | ICD-10-CM

## 2017-01-03 DIAGNOSIS — Z79899 Other long term (current) drug therapy: Secondary | ICD-10-CM | POA: Diagnosis not present

## 2017-01-03 DIAGNOSIS — R7309 Other abnormal glucose: Secondary | ICD-10-CM | POA: Diagnosis not present

## 2017-01-03 DIAGNOSIS — E782 Mixed hyperlipidemia: Secondary | ICD-10-CM | POA: Diagnosis not present

## 2017-01-03 DIAGNOSIS — R7303 Prediabetes: Secondary | ICD-10-CM

## 2017-01-03 LAB — CBC WITH DIFFERENTIAL/PLATELET
BASOS PCT: 1 %
Basophils Absolute: 77 cells/uL (ref 0–200)
EOS ABS: 231 {cells}/uL (ref 15–500)
EOS PCT: 3 %
HCT: 45.7 % (ref 38.5–50.0)
Hemoglobin: 15.6 g/dL (ref 13.2–17.1)
LYMPHS ABS: 2849 {cells}/uL (ref 850–3900)
LYMPHS PCT: 37 %
MCH: 31.4 pg (ref 27.0–33.0)
MCHC: 34.1 g/dL (ref 32.0–36.0)
MCV: 92 fL (ref 80.0–100.0)
MONO ABS: 539 {cells}/uL (ref 200–950)
MONOS PCT: 7 %
MPV: 9.8 fL (ref 7.5–12.5)
NEUTROS ABS: 4004 {cells}/uL (ref 1500–7800)
Neutrophils Relative %: 52 %
PLATELETS: 300 10*3/uL (ref 140–400)
RBC: 4.97 MIL/uL (ref 4.20–5.80)
RDW: 13.8 % (ref 11.0–15.0)
WBC: 7.7 10*3/uL (ref 3.8–10.8)

## 2017-01-03 LAB — TSH: TSH: 1.12 mIU/L (ref 0.40–4.50)
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
## 2017-01-03 NOTE — Patient Instructions (Signed)

## 2017-01-04 LAB — HEPATIC FUNCTION PANEL
ALT: 25 U/L (ref 9–46)
AST: 29 U/L (ref 10–35)
Albumin: 4.3 g/dL (ref 3.6–5.1)
Alkaline Phosphatase: 82 U/L (ref 40–115)
BILIRUBIN INDIRECT: 0.4 mg/dL (ref 0.2–1.2)
BILIRUBIN TOTAL: 0.5 mg/dL (ref 0.2–1.2)
Bilirubin, Direct: 0.1 mg/dL (ref <=0.2)
TOTAL PROTEIN: 7.3 g/dL (ref 6.1–8.1)

## 2017-01-04 LAB — BASIC METABOLIC PANEL WITH GFR
BUN: 9 mg/dL (ref 7–25)
CALCIUM: 9.8 mg/dL (ref 8.6–10.3)
CO2: 19 mmol/L — ABNORMAL LOW (ref 20–32)
Chloride: 104 mmol/L (ref 98–110)
Creat: 0.96 mg/dL (ref 0.70–1.33)
GFR, EST NON AFRICAN AMERICAN: 89 mL/min (ref >=60)
GLUCOSE: 90 mg/dL (ref 65–99)
Potassium: 4.5 mmol/L (ref 3.5–5.3)
SODIUM: 138 mmol/L (ref 135–146)

## 2017-01-04 LAB — INSULIN, RANDOM: INSULIN: 12.7 u[IU]/mL (ref 2.0–19.6)

## 2017-01-04 LAB — LIPID PANEL
CHOLESTEROL: 206 mg/dL — AB (ref <200)
HDL: 67 mg/dL (ref >40)
LDL Cholesterol: 128 mg/dL — ABNORMAL HIGH (ref <100)
TRIGLYCERIDES: 55 mg/dL (ref <150)
Total CHOL/HDL Ratio: 3.1 Ratio (ref <5.0)
VLDL: 11 mg/dL (ref <30)

## 2017-01-04 LAB — HEMOGLOBIN A1C
Hgb A1c MFr Bld: 5.3 % (ref <5.7)
MEAN PLASMA GLUCOSE: 105 mg/dL

## 2017-01-04 LAB — MAGNESIUM: Magnesium: 2.1 mg/dL (ref 1.5–2.5)

## 2017-01-04 LAB — VITAMIN D 25 HYDROXY (VIT D DEFICIENCY, FRACTURES): Vit D, 25-Hydroxy: 34 ng/mL (ref 30–100)

## 2017-04-06 ENCOUNTER — Other Ambulatory Visit: Payer: Self-pay | Admitting: Internal Medicine

## 2017-04-06 ENCOUNTER — Other Ambulatory Visit: Payer: Self-pay | Admitting: Physician Assistant

## 2017-04-06 DIAGNOSIS — F419 Anxiety disorder, unspecified: Secondary | ICD-10-CM

## 2017-04-06 MED ORDER — ALPRAZOLAM 1 MG PO TABS: ORAL_TABLET | ORAL | 0 refills | Status: AC

## 2017-04-06 MED ORDER — ALPRAZOLAM 1 MG PO TABS: ORAL_TABLET | ORAL | 1 refills | Status: DC

## 2017-04-06 NOTE — Telephone Encounter (Signed)
Please call Alpraz  

## 2017-04-07 NOTE — Telephone Encounter (Signed)
XANAX HAS BEEN CALLED INTO PHARMACY ON NOV 16TH 2018 BY DD

## 2017-04-09 NOTE — Progress Notes (Signed)
FOLLOW UP  Assessment and Plan:   Bipolar depression Well managed by current regimen; continue medications Stress management techniques discussed, increase water, good sleep hygiene discussed, increase exercise, and increase veggies.   Hypertension Well controlled with current medications Monitor blood pressure at home; patient to call if consistently greater than 130/80 Continue DASH diet.   Reminder to go to the ER if any CP, SOB, nausea, dizziness, severe HA, changes vision/speech, left arm numbness and tingling and jaw pain.  Cholesterol Continue medication  Continue low cholesterol diet and exercise.  Check lipid panel.   Abnormal glucose Continue diet and exercise.  Perform daily foot/skin check, notify office of any concerning changes.  Check A1C  Obesity with co morbidities Long discussion about weight loss, diet, and exercise Discussed ideal weight for height and initial weight goal (226) Patient will work on continue with exercise - continue pushing vegetables Will follow up in 3 mont1s  Vitamin D Def/ osteoporosis prevention Continue supplementation Check Vit D level  Smoking cessation -  instruction/counseling given, counseled patient on the dangers of tobacco use, advised patient to stop smoking, and reviewed strategies to maximize success -  patient not ready to quit at this time.    Continue diet and meds as discussed. Further disposition pending results of labs. Discussed med's effects and SE's.   Over 30 minutes of exam, counseling, chart review, and critical decision making was performed.   Future Appointments  Date Time Provider [place]  08/01/2017  3:00 PM Unk Pinto, MD [facility] None    ----------------------------------------------------------------------------------------------------------------------  HPI 55 y.o. male  presents for 3 month follow up on bipolar depression, hypertension, cholesterol, abnormal glucose, obesity,  tobacco use and vitamin D deficiency.   he has a diagnosis of bipolar depression and is currently on Celexa, amitriptyline and PRN xanax, reports symptoms are well controlled on current regimen without recent episodes.    BMI is Body mass index is 31.77 kg/m., he has been working on diet and exercise. Wt Readings from Last 3 Encounters:  04/10/17 231 lb (104.8 kg)  01/03/17 231 lb 12.8 oz (105.1 kg)  10/06/16 238 lb 6.4 oz (108.1 kg)   he currently continues to smoke 1/2 pack a day; discussed risks associated with smoking, patient is not ready to quit.  His blood pressure has been controlled at home (110s over 70s), today their BP is BP: 138/76  He does workout (yardwork). He denies chest pain, shortness of breath, dizziness.   He is on cholesterol medication and denies myalgias. His cholesterol is not at goal. The cholesterol last visit was:   Lab Results  Component Value Date   CHOL 206 (H) 01/03/2017   HDL 67 01/03/2017   LDLCALC 128 (H) 01/03/2017   TRIG 55 01/03/2017   CHOLHDL 3.1 01/03/2017    He has been working on diet and exercise for abnormal glucose, and denies nausea, paresthesia of the feet, polydipsia, polyuria, visual disturbances and vomiting. Last A1C in the office was:  Lab Results  Component Value Date   HGBA1C 5.3 01/03/2017   Patient is on Vitamin D supplement.   Lab Results  Component Value Date   VD25OH 34 01/03/2017        Current Medications:  Current Outpatient Medications on File Prior to Visit  Medication Sig  . ALPRAZolam (XANAX) 1 MG tablet TAKE 1/2 TO 1 TABLET 3 x /  DAILY AS NEEDED  . amitriptyline (ELAVIL) 25 MG tablet TAKE ONE TABLET BY MOUTH 4 TIMES DAILY  FOR MOOD  . amitriptyline (ELAVIL) 25 MG tablet TAKE 1 TABLET BY MOUTH 4 TIMES DAILY FOR MOOD  . aspirin EC 325 MG tablet Take 650 mg by mouth daily.  . carisoprodol (SOMA) 350 MG tablet Take 350 mg 3 (three) times daily by mouth.  . citalopram (CELEXA) 40 MG tablet TAKE ONE TABLET BY  MOUTH ONCE DAILY  . ibuprofen (ADVIL,MOTRIN) 200 MG tablet Take 800 mg by mouth every 8 (eight) hours as needed for moderate pain.   Marland Kitchen lisinopril-hydrochlorothiazide (PRINZIDE,ZESTORETIC) 20-25 MG tablet TAKE ONE TABLET BY MOUTH ONCE DAILY  . pravastatin (PRAVACHOL) 40 MG tablet Take 1 tablet (40 mg total) by mouth daily.  . VENTOLIN HFA 108 (90 Base) MCG/ACT inhaler INHALE TWO PUFFS BY MOUTH EVERY 4 HOURS AS NEEDED FOR WHEEZING AND FOR SHORTNESS OF BREATH   No current facility-administered medications on file prior to visit.      Allergies:  Allergies  Allergen Reactions  . Codeine Itching     Medical History:  Past Medical History:  Diagnosis Date  . Bipolar 1 disorder ([place])   . Hepatitis C   . Hypertension   . Kidney stones    Family history- Reviewed and unchanged Social history- Reviewed and unchanged   Review of Systems:  Review of Systems  Constitutional: Negative for malaise/fatigue and weight loss.  HENT: Negative for hearing loss and tinnitus.   Eyes: Negative for blurred vision and double vision.  Respiratory: Negative for cough, shortness of breath and wheezing.   Cardiovascular: Negative for chest pain, palpitations, orthopnea, claudication and leg swelling.  Gastrointestinal: Negative for abdominal pain, blood in stool, constipation, diarrhea, heartburn, melena, nausea and vomiting.  Genitourinary: Negative.   Musculoskeletal: Negative for joint pain and myalgias.  Skin: Negative for rash.  Neurological: Negative for dizziness, tingling, sensory change, weakness and headaches.  Endo/Heme/Allergies: Negative for polydipsia.  Psychiatric/Behavioral: Negative.  Negative for depression, substance abuse and suicidal ideas. The patient is not nervous/anxious and does not have insomnia.   All other systems reviewed and are negative.   Physical Exam: BP 138/76   Pulse 89   Temp 97.7 F (36.5 C)   Ht 5' 11.5" (1.816 m)   Wt 231 lb (104.8 kg)   SpO2 95%   BMI  31.77 kg/m  Wt Readings from Last 3 Encounters:  04/10/17 231 lb (104.8 kg)  01/03/17 231 lb 12.8 oz (105.1 kg)  10/06/16 238 lb 6.4 oz (108.1 kg)   General Appearance: Well nourished, in no apparent distress. Eyes: PERRLA, EOMs, conjunctiva no swelling or erythema Sinuses: No Frontal/maxillary tenderness ENT/Mouth: Ext aud canals clear, TMs without erythema, bulging. No erythema, swelling, or exudate on post pharynx.  Tonsils not swollen or erythematous. Hearing normal.  Neck: Supple, thyroid normal.  Respiratory: Respiratory effort normal, BS equal bilaterally without rales, rhonchi, wheezing or stridor.  Cardio: RRR with no MRGs. Brisk peripheral pulses without edema.  Abdomen: Soft, + BS.  Non tender, no guarding, rebound, hernias, masses. Lymphatics: Non tender without lymphadenopathy.  Musculoskeletal: Full ROM, 5/5 strength, Normal gait Skin: Warm, dry without rashes, lesions, ecchymosis.  Neuro: Cranial nerves intact. No cerebellar symptoms.  Psych: Awake and oriented X 3, normal affect, Insight and Judgment appropriate.    Izora Ribas, NP 3:06 PM [facility]

## 2017-04-10 ENCOUNTER — Encounter: Payer: Self-pay | Admitting: Adult Health

## 2017-04-10 ENCOUNTER — Ambulatory Visit (INDEPENDENT_AMBULATORY_CARE_PROVIDER_SITE_OTHER): Payer: Medicare Other | Admitting: Adult Health

## 2017-04-10 VITALS — BP 138/76 | HR 89 | Temp 97.7°F | Ht 71.5 in | Wt 231.0 lb

## 2017-04-10 DIAGNOSIS — E559 Vitamin D deficiency, unspecified: Secondary | ICD-10-CM | POA: Diagnosis not present

## 2017-04-10 DIAGNOSIS — E669 Obesity, unspecified: Secondary | ICD-10-CM | POA: Diagnosis not present

## 2017-04-10 DIAGNOSIS — F3170 Bipolar disorder, currently in remission, most recent episode unspecified: Secondary | ICD-10-CM

## 2017-04-10 DIAGNOSIS — E782 Mixed hyperlipidemia: Secondary | ICD-10-CM

## 2017-04-10 DIAGNOSIS — R7309 Other abnormal glucose: Secondary | ICD-10-CM | POA: Diagnosis not present

## 2017-04-10 DIAGNOSIS — F17200 Nicotine dependence, unspecified, uncomplicated: Secondary | ICD-10-CM

## 2017-04-10 DIAGNOSIS — Z79899 Other long term (current) drug therapy: Secondary | ICD-10-CM

## 2017-04-10 DIAGNOSIS — I10 Essential (primary) hypertension: Secondary | ICD-10-CM | POA: Diagnosis not present

## 2017-04-10 NOTE — Patient Instructions (Signed)

## 2017-04-11 LAB — HEPATIC FUNCTION PANEL
AG Ratio: 1.4 (calc) (ref 1.0–2.5)
ALKALINE PHOSPHATASE (APISO): 67 U/L (ref 40–115)
ALT: 26 U/L (ref 9–46)
AST: 31 U/L (ref 10–35)
Albumin: 4.3 g/dL (ref 3.6–5.1)
BILIRUBIN DIRECT: 0.1 mg/dL (ref 0.0–0.2)
BILIRUBIN INDIRECT: 0.3 mg/dL (ref 0.2–1.2)
Globulin: 3 g/dL (calc) (ref 1.9–3.7)
Total Bilirubin: 0.4 mg/dL (ref 0.2–1.2)
Total Protein: 7.3 g/dL (ref 6.1–8.1)

## 2017-04-11 LAB — CBC WITH DIFFERENTIAL/PLATELET
BASOS ABS: 70 {cells}/uL (ref 0–200)
BASOS PCT: 0.9 %
EOS PCT: 2 %
Eosinophils Absolute: 156 cells/uL (ref 15–500)
HEMATOCRIT: 43.9 % (ref 38.5–50.0)
HEMOGLOBIN: 14.9 g/dL (ref 13.2–17.1)
LYMPHS ABS: 2168 {cells}/uL (ref 850–3900)
MCH: 31.2 pg (ref 27.0–33.0)
MCHC: 33.9 g/dL (ref 32.0–36.0)
MCV: 91.8 fL (ref 80.0–100.0)
MPV: 10.1 fL (ref 7.5–12.5)
Monocytes Relative: 5.7 %
Neutro Abs: 4961 cells/uL (ref 1500–7800)
Neutrophils Relative %: 63.6 %
Platelets: 245 10*3/uL (ref 140–400)
RBC: 4.78 10*6/uL (ref 4.20–5.80)
RDW: 12.1 % (ref 11.0–15.0)
Total Lymphocyte: 27.8 %
WBC mixed population: 445 cells/uL (ref 200–950)
WBC: 7.8 10*3/uL (ref 3.8–10.8)

## 2017-04-11 LAB — BASIC METABOLIC PANEL WITH GFR
BUN: 11 mg/dL (ref 7–25)
CALCIUM: 9.6 mg/dL (ref 8.6–10.3)
CHLORIDE: 105 mmol/L (ref 98–110)
CO2: 26 mmol/L (ref 20–32)
CREATININE: 0.88 mg/dL (ref 0.70–1.33)
GFR, EST AFRICAN AMERICAN: 112 mL/min/{1.73_m2} (ref >=60)
GFR, Est Non African American: 97 mL/min/{1.73_m2} (ref >=60)
GLUCOSE: 95 mg/dL (ref 65–99)
POTASSIUM: 4.5 mmol/L (ref 3.5–5.3)
SODIUM: 140 mmol/L (ref 135–146)

## 2017-04-11 LAB — HEMOGLOBIN A1C
EAG (MMOL/L): 6 (calc)
Hgb A1c MFr Bld: 5.4 % of total Hgb (ref <5.7)
Mean Plasma Glucose: 108 (calc)

## 2017-04-11 LAB — LIPID PANEL
CHOL/HDL RATIO: 3.1 (calc) (ref <5.0)
CHOLESTEROL: 204 mg/dL — AB (ref <200)
HDL: 66 mg/dL (ref >40)
LDL Cholesterol (Calc): 121 mg/dL (calc) — ABNORMAL HIGH
Non-HDL Cholesterol (Calc): 138 mg/dL (calc) — ABNORMAL HIGH (ref <130)
Triglycerides: 76 mg/dL (ref <150)

## 2017-04-11 LAB — TSH: TSH: 1.01 mIU/L (ref 0.40–4.50)

## 2017-04-26 ENCOUNTER — Other Ambulatory Visit: Payer: Self-pay | Admitting: Internal Medicine

## 2017-05-30 ENCOUNTER — Encounter: Payer: Self-pay | Admitting: Internal Medicine

## 2017-06-23 DIAGNOSIS — H2513 Age-related nuclear cataract, bilateral: Secondary | ICD-10-CM | POA: Diagnosis not present

## 2017-07-26 ENCOUNTER — Other Ambulatory Visit: Payer: Self-pay | Admitting: Internal Medicine

## 2017-07-26 DIAGNOSIS — E782 Mixed hyperlipidemia: Secondary | ICD-10-CM

## 2017-07-26 MED ORDER — PRAVASTATIN SODIUM 40 MG PO TABS: 40.0000 mg | ORAL_TABLET | Freq: Every day | ORAL | 1 refills | Status: DC

## 2017-08-01 ENCOUNTER — Encounter: Payer: Self-pay | Admitting: Internal Medicine

## 2017-08-03 ENCOUNTER — Ambulatory Visit (INDEPENDENT_AMBULATORY_CARE_PROVIDER_SITE_OTHER): Payer: Medicare Other | Admitting: Adult Health

## 2017-08-03 ENCOUNTER — Encounter: Payer: Self-pay | Admitting: Adult Health

## 2017-08-03 VITALS — BP 126/88 | HR 89 | Temp 97.5°F | Resp 14 | Ht 71.5 in | Wt 239.2 lb

## 2017-08-03 DIAGNOSIS — W540XXA Bitten by dog, initial encounter: Secondary | ICD-10-CM | POA: Diagnosis not present

## 2017-08-03 DIAGNOSIS — I10 Essential (primary) hypertension: Secondary | ICD-10-CM

## 2017-08-03 MED ORDER — AMOXICILLIN-POT CLAVULANATE 875-125 MG PO TABS: 1.0000 | ORAL_TABLET | Freq: Two times a day (BID) | ORAL | 0 refills | Status: AC

## 2017-08-03 NOTE — Patient Instructions (Signed)
Animal Bite Animal bites can range from mild to serious. An animal bite can result in a scratch on the skin, a deep open cut, a puncture of the skin, a crush injury, or tearing away of the skin or a body part. A small bite from a house pet will usually not cause serious problems. However, some animal bites can become infected or injure a bone or other tissue. Bites from certain animals can be more dangerous because of the risk of spreading rabies, which is a serious viral infection. This risk is higher with bites from stray animals or wild animals, such as raccoons, foxes, skunks, and bats. Dogs are responsible for most animal bites. Children are bitten more often than adults. What are the signs or symptoms? Common symptoms of an animal bite include:  Pain.  Bleeding.  Swelling.  Bruising.  How is this diagnosed? This condition may be diagnosed based on a physical exam and medical history. Your health care provider will examine the wound and ask for details about the animal and how the bite happened. You may also have tests, such as:  Blood tests to check for infection or to determine if surgery is needed.  X-rays to check for damage to bones or joints.  Culture test. This uses a sample of fluid from the wound to check for infection.  How is this treated? Treatment varies depending on the location and type of animal bite and your medical history. Treatment may include:  Wound care. This often includes cleaning the wound, flushing the wound with saline solution, and applying a bandage (dressing). Sometimes, the wound is left open to heal because of the high risk of infection. However, in some cases, the wound may be closed with stitches (sutures), staples, skin glue, or adhesive strips.  Antibiotic medicine.  Tetanus shot.  Rabies treatment if the animal could have rabies.  In some cases, bites that have become infected may require IV antibiotics and surgical treatment in the  hospital. Follow these instructions at home: Wound care  Follow instructions from your health care provider about how to take care of your wound. Make sure you: ? Wash your hands with soap and water before you change your dressing. If soap and water are not available, use hand sanitizer. ? Change your dressing as told by your health care provider. ? Leave sutures, skin glue, or adhesive strips in place. These skin closures may need to be in place for 2 weeks or longer. If adhesive strip edges start to loosen and curl up, you may trim the loose edges. Do not remove adhesive strips completely unless your health care provider tells you to do that.  Check your wound every day for signs of infection. Watch for: ? Increasing redness, swelling, or pain. ? Fluid, blood, or pus. General instructions  Take or apply over-the-counter and prescription medicines only as told by your health care provider.  If you were prescribed an antibiotic, take or apply it as told by your health care provider. Do not stop using the antibiotic even if your condition improves.  Keep the injured area raised (elevated) above the level of your heart while you are sitting or lying down, if this is possible.  If directed, apply ice to the injured area. ? Put ice in a plastic bag. ? Place a towel between your skin and the bag. ? Leave the ice on for 20 minutes, 2-3 times per day.  Keep all follow-up visits as told by your health care   provider. This is important. Contact a health care provider if:  You have increasing redness, swelling, or pain at the site of your wound.  You have a general feeling of sickness (malaise).  You feel nauseous or you vomit.  You have pain that does not get better. Get help right away if:  You have a red streak extending away from your wound.  You have fluid, blood, or pus coming from your wound.  You have a fever or chills.  You have trouble moving your injured area.  You have  numbness or tingling extending beyond the wound. This information is not intended to replace advice given to you by your health care provider. Make sure you discuss any questions you have with your health care provider. Document Released: 01/25/2011 Document Revised: 09/16/2015 Document Reviewed: 09/24/2014 Elsevier Interactive Patient Education  2018 Elsevier Inc.  

## 2017-08-03 NOTE — Progress Notes (Signed)
Assessment and Plan:  Tyke was seen today for acute visit.  Diagnoses and all orders for this visit:  Dog bite, initial encounter Continue to keep area clean, irrigate well, may apply topical antibiotic cream and simple bandage if desired. Continue to monitor for signs of infection (redness, warmth, progressive) and start antibiotic only if these symptoms occur. Present to ED for any sudden/severe symptoms.  Present to health department for rabies prevention if dog owner is unable to produce documentation for up to date rabies vaccination.  -     amoxicillin-clavulanate (AUGMENTIN) 875-125 MG tablet; Take 1 tablet by mouth 2 (two) times daily for 7 days.  Further disposition pending results of labs. Discussed med's effects and SE's.   Over 15 minutes of exam, counseling, chart review, and critical decision making was performed.   Future Appointments  Date Time Provider [place]  08/03/2017  2:30 PM Liane Comber, NP [facility] None  08/31/2017  2:00 PM Unk Pinto, MD [facility] None  09/04/2018  3:00 PM Unk Pinto, MD [facility] None    ------------------------------------------------------------------------------------------------------------------   HPI BP 126/88   Pulse 89   Temp (!) 97.5 F (36.4 C)   Resp 14   Ht 5' 11.5" (1.816 m)   Wt 239 lb 3.2 oz (108.5 kg)   SpO2 98%   BMI 32.90 kg/m   57 y.o.male presents for evaluation of a dog bite that occurred this AM to his left calf by a border collie with reported know vaccination history (up to date with documentation per dog owner, will present paperwork tomorrow). Appears as superficial puncture wound with superficial abrasion. No deep laceration requiring stitches.   Past Medical History:  Diagnosis Date  . Bipolar 1 disorder ([place])   . Hepatitis C   . Hypertension   . Kidney stones      Allergies  Allergen Reactions  . Codeine Itching    Current Outpatient Medications on File Prior to  Visit  Medication Sig  . amitriptyline (ELAVIL) 25 MG tablet TAKE ONE TABLET BY MOUTH 4 TIMES DAILY FOR MOOD  . aspirin EC 325 MG tablet Take 650 mg by mouth daily.  . carisoprodol (SOMA) 350 MG tablet Take 350 mg 3 (three) times daily by mouth.  . citalopram (CELEXA) 40 MG tablet TAKE 1 TABLET BY MOUTH ONCE DAILY  . ibuprofen (ADVIL,MOTRIN) 200 MG tablet Take 800 mg by mouth every 8 (eight) hours as needed for moderate pain.   Marland Kitchen lisinopril-hydrochlorothiazide (PRINZIDE,ZESTORETIC) 20-25 MG tablet TAKE ONE TABLET BY MOUTH ONCE DAILY  . pravastatin (PRAVACHOL) 40 MG tablet Take 1 tablet (40 mg total) by mouth daily.  . VENTOLIN HFA 108 (90 Base) MCG/ACT inhaler INHALE TWO PUFFS BY MOUTH EVERY 4 HOURS AS NEEDED FOR WHEEZING AND FOR SHORTNESS OF BREATH   No current facility-administered medications on file prior to visit.     ROS: all negative except above.   Physical Exam:  BP 126/88   Pulse 89   Temp (!) 97.5 F (36.4 C)   Resp 14   Ht 5' 11.5" (1.816 m)   Wt 239 lb 3.2 oz (108.5 kg)   SpO2 98%   BMI 32.90 kg/m   General Appearance: Well nourished, in no apparent distress. Neck: Supple, thyroid normal.  Respiratory: Respiratory effort normal, BS equal bilaterally without rales, rhonchi, wheezing or stridor.  Cardio: RRR with no MRGs. Brisk peripheral pulses without edema.  Musculoskeletal: Full ROM, 5/5 strength, normal gait.  Skin: Warm, dry without rashes, lesions, ecchymosis. Bite  wound with superficial puncture x 2 and single superficial linear abrasion approx 3 cm long. Not actively bleeding. Not injected or warm to touch. No discharge.  Neuro: Sensation intact.  Psych: Awake and oriented X 3, normal affect, Insight and Judgment appropriate.     Izora Ribas, NP 2:17 PM [facility]

## 2017-08-21 ENCOUNTER — Other Ambulatory Visit: Payer: Self-pay | Admitting: Internal Medicine

## 2017-08-31 ENCOUNTER — Ambulatory Visit (INDEPENDENT_AMBULATORY_CARE_PROVIDER_SITE_OTHER): Payer: Medicare Other | Admitting: Internal Medicine

## 2017-08-31 VITALS — BP 128/76 | HR 92 | Temp 97.5°F | Resp 18 | Ht 71.5 in | Wt 227.4 lb

## 2017-08-31 DIAGNOSIS — B182 Chronic viral hepatitis C: Secondary | ICD-10-CM

## 2017-08-31 DIAGNOSIS — Z136 Encounter for screening for cardiovascular disorders: Secondary | ICD-10-CM

## 2017-08-31 DIAGNOSIS — R7309 Other abnormal glucose: Secondary | ICD-10-CM

## 2017-08-31 DIAGNOSIS — Z8249 Family history of ischemic heart disease and other diseases of the circulatory system: Secondary | ICD-10-CM | POA: Diagnosis not present

## 2017-08-31 DIAGNOSIS — F17200 Nicotine dependence, unspecified, uncomplicated: Secondary | ICD-10-CM | POA: Diagnosis not present

## 2017-08-31 DIAGNOSIS — Z79899 Other long term (current) drug therapy: Secondary | ICD-10-CM

## 2017-08-31 DIAGNOSIS — E559 Vitamin D deficiency, unspecified: Secondary | ICD-10-CM

## 2017-08-31 DIAGNOSIS — N138 Other obstructive and reflux uropathy: Secondary | ICD-10-CM | POA: Diagnosis not present

## 2017-08-31 DIAGNOSIS — E782 Mixed hyperlipidemia: Secondary | ICD-10-CM

## 2017-08-31 DIAGNOSIS — R7303 Prediabetes: Secondary | ICD-10-CM | POA: Diagnosis not present

## 2017-08-31 DIAGNOSIS — Z125 Encounter for screening for malignant neoplasm of prostate: Secondary | ICD-10-CM

## 2017-08-31 DIAGNOSIS — I10 Essential (primary) hypertension: Secondary | ICD-10-CM

## 2017-08-31 DIAGNOSIS — Z1212 Encounter for screening for malignant neoplasm of rectum: Secondary | ICD-10-CM

## 2017-08-31 DIAGNOSIS — Z1211 Encounter for screening for malignant neoplasm of colon: Secondary | ICD-10-CM

## 2017-08-31 DIAGNOSIS — N401 Enlarged prostate with lower urinary tract symptoms: Secondary | ICD-10-CM

## 2017-08-31 NOTE — Progress Notes (Signed)
[facility]   Unk Pinto, M.D.     Uvaldo Bristle. Silverio Lay, P.A.-C Liane Comber, [place]                [address] [place] [address] Telephone [phone] Telefax [phone]   Comprehensive Evaluation & Examination     This very nice 57 y.o. single WM presents for a  comprehensive evaluation and management of multiple medical co-morbidities.  Patient has been followed for HTN, HLD, T2_NIDDM  Prediabetes and Vitamin D Deficiency. Patient was treated for Hepatitis C in 2008-2009.      HTN predates since 2012. Patient's BP has been controlled at home.  Today's BP is at goal - 128/76. Patient denies any cardiac symptoms as chest pain, palpitations, shortness of breath, dizziness or ankle swelling.     Patient's hyperlipidemia is not controlled with diet and medications. Patient denies myalgias or other medication SE's. Last lipids were not at goal: Lab Results  Component Value Date   CHOL 204 (H) 04/10/2017   HDL 66 04/10/2017   LDLCALC 121 (H) 04/10/2017   TRIG 76 04/10/2017   CHOLHDL 3.1 04/10/2017      Patient has  prediabetes & Insulin Resistance which normalized after weight loss and patient denies reactive hypoglycemic symptoms, visual blurring, diabetic polys or paresthesias. Last A1c was Normal & at goal: Lab Results  Component Value Date   HGBA1C 5.4 04/10/2017       Finally, patient has history of Vitamin D Deficiency ("42" on treatment in 2014)  and he does not supplement Vit D as recommended. Last vitamin D was low: Lab Results  Component Value Date   VD25OH 34 01/03/2017   Current Outpatient Medications on File Prior to Visit  Medication Sig  . albuterol (PROVENTIL HFA;VENTOLIN HFA) 108 (90 Base) MCG/ACT inhaler INHALE 2 PUFFS BY MOUTH EVERY 4 HOURS AS NEEDED FOR WHEEZING AND FOR SHORTNESS OF BREATH  . amitriptyline (ELAVIL) 25 MG tablet TAKE ONE TABLET BY MOUTH  4 TIMES DAILY FOR MOOD  . aspirin EC 325 MG tablet Take 650 mg by mouth daily.  . carisoprodol (SOMA) 350 MG tablet Take 350 mg 3 (three) times daily by mouth.  . citalopram (CELEXA) 40 MG tablet TAKE 1 TABLET BY MOUTH ONCE DAILY  . ibuprofen (ADVIL,MOTRIN) 200 MG tablet Take 800 mg by mouth every 8 (eight) hours as needed for moderate pain.   Marland Kitchen lisinopril-hydrochlorothiazide (PRINZIDE,ZESTORETIC) 20-25 MG tablet TAKE ONE TABLET BY MOUTH ONCE DAILY  . pravastatin (PRAVACHOL) 40 MG tablet Take 1 tablet (40 mg total) by mouth daily.   No current facility-administered medications on file prior to visit.    Allergies  Allergen Reactions  . Codeine Itching   Past Medical History:  Diagnosis Date  . Bipolar 1 disorder ([place])   . Hepatitis C   . Hypertension   . Kidney stones    Health Maintenance  Topic Date Due  . HIV Screening  06/17/1976  . INFLUENZA VACCINE  12/21/2017  . Fecal DNA (Cologuard)  10/27/2019  . TETANUS/TDAP  03/31/2025  . Hepatitis C Screening  Completed   Immunization History  Administered Date(s) Administered  . DT 04/01/2015  . Td 05/23/2004   Last Colon - Negative Cologard in June 2018 and due repeat 3 years in June 2021.   Past Surgical History:  Procedure Laterality Date  .  CHOLECYSTECTOMY     Family History  Problem Relation Age of Onset  . Mental illness Mother        Bipolar  . Stroke Father   . Arthritis Father    Socioeconomic History  . Marital status: Divorced  Occupational History  .  On SS Disability since 2008 for PTSD & Panic Disorder  Tobacco Use  . Smoking status: Current Every Day Smoker    Packs/day: 0.50  . Smokeless tobacco: Never Used  Substance and Sexual Activity  . Alcohol use: Yes    Alcohol/week: 1.2 oz    Types: 2 Cans of beer per week  . Drug use: No  . Sexual activity: Not on file    ROS Constitutional: Denies fever, chills, weight loss/gain, headaches, insomnia,  night sweats or change in appetite. Does c/o  fatigue. Eyes: Denies redness, blurred vision, diplopia, discharge, itchy or watery eyes.  ENT: Denies discharge, congestion, post nasal drip, epistaxis, sore throat, earache, hearing loss, dental pain, Tinnitus, Vertigo, Sinus pain or snoring.  Cardio: Denies chest pain, palpitations, irregular heartbeat, syncope, dyspnea, diaphoresis, orthopnea, PND, claudication or edema Respiratory: denies cough, dyspnea, DOE, pleurisy, hoarseness, laryngitis or wheezing.  Gastrointestinal: Denies dysphagia, heartburn, reflux, water brash, pain, cramps, nausea, vomiting, bloating, diarrhea, constipation, hematemesis, melena, hematochezia, jaundice or hemorrhoids Genitourinary: Denies dysuria, frequency, urgency, nocturia, hesitancy, discharge, hematuria or flank pain Musculoskeletal: Denies arthralgia, myalgia, stiffness, Jt. Swelling, pain, limp or strain/sprain. Denies Falls. Skin: Denies puritis, rash, hives, warts, acne, eczema or change in skin lesion Neuro: No weakness, tremor, incoordination, spasms, paresthesia or pain Psychiatric: Denies confusion, memory loss or sensory loss. Denies Depression. Endocrine: Denies change in weight, skin, hair change, nocturia, and paresthesia, diabetic polys, visual blurring or hyper / hypo glycemic episodes.  Heme/Lymph: No excessive bleeding, bruising or enlarged lymph nodes.  Physical Exam  BP 128/76   Pulse 92   Temp (!) 97.5 F (36.4 C)   Resp 18   Ht 5' 11.5" (1.816 m)   Wt 227 lb 6.4 oz (103.1 kg)   BMI 31.27 kg/m   General Appearance: Over nourished and well groomed and in no apparent distress.  Eyes: PERRLA, EOMs, conjunctiva no swelling or erythema, normal fundi and vessels. Sinuses: No frontal/maxillary tenderness ENT/Mouth: EACs patent / TMs  nl. Nares clear without erythema, swelling, mucoid exudates. Oral hygiene is good. No erythema, swelling, or exudate. Tongue normal, non-obstructing. Tonsils not swollen or erythematous. Hearing normal.   Neck: Supple, thyroid not palpable. No bruits, nodes or JVD. Respiratory: Respiratory effort normal.  BS equal and clear bilateral without rales, rhonci, wheezing or stridor. Cardio: Heart sounds are normal with regular rate and rhythm and no murmurs, rubs or gallops. Peripheral pulses are normal and equal bilaterally without edema. No aortic or femoral bruits. Chest: symmetric with normal excursions and percussion.  Abdomen: Soft, with Nl bowel sounds. Nontender, no guarding, rebound, hernias, masses, or organomegaly.  Lymphatics: Non tender without lymphadenopathy.  Genitourinary: No hernias.Testes nl. DRE - prostate nl for age - smooth & firm w/o nodules. Musculoskeletal: Full ROM all peripheral extremities, joint stability, 5/5 strength, and normal gait. Skin: Warm and dry without rashes, lesions, cyanosis, clubbing or  ecchymosis.  Neuro: Cranial nerves intact, reflexes equal bilaterally. Normal muscle tone, no cerebellar symptoms. Sensation intact.  Pysch: Alert and oriented X 3 with normal affect, insight and judgment appropriate.   Assessment and Plan  1. Essential hypertension  - EKG 12-Lead - [place], RETROPERITNL ABD,  LTD - Urinalysis, Routine w  reflex microscopic - Microalbumin / creatinine urine ratio - CBC with Differential/Platelet - BASIC METABOLIC PANEL WITH GFR - Magnesium - TSH  2. Hyperlipidemia, mixed  - EKG 12-Lead - [place], RETROPERITNL ABD,  LTD - Hepatic function panel - Lipid panel  3. Abnormal glucose  - EKG 12-Lead - [place], RETROPERITNL ABD,  LTD - Hemoglobin A1c - Insulin, random  4. Vitamin D deficiency  - VITAMIN D 25 Hydroxyl  5. Prediabetes  - EKG 12-Lead - [place], RETROPERITNL ABD,  LTD - Hemoglobin A1c - Insulin, random  6. Chronic hepatitis C without hepatic coma (HCC)  - Hepatic function panel - Hepatitis C RNA quantitative  7. Screening for colorectal cancer  - POC Hemoccult Bld/Stl  8. BPH with obstruction/lower urinary tract  symptoms  - PSA  9. Prostate cancer screening  - PSA  10. Screening for ischemic heart disease  - EKG 12-Lead  11. Screening for AAA (aortic abdominal aneurysm)  - [place], RETROPERITNL ABD,  LTD  12. Smoker  - EKG 12-Lead - [place], RETROPERITNL ABD,  LTD  13. FHx: heart disease  - EKG 12-Lead - [place], RETROPERITNL ABD,  LTD  14. Medication management  - Urinalysis, Routine w reflex microscopic - Microalbumin / creatinine urine ratio - CBC with Differential/Platelet - BASIC METABOLIC PANEL WITH GFR - Hepatic function panel - Magnesium - Lipid panel - TSH - Hemoglobin A1c - Insulin, random - VITAMIN D 25 Hydroxyl  - Hepatitis C RNA quantitative           Patient was counseled in prudent diet, weight control to achieve/maintain BMI less than 25, BP monitoring, regular exercise and medications as discussed.  Discussed med effects and SE's. Routine screening labs and tests as requested with regular follow-up as recommended. Over 40 minutes of exam, counseling, chart review and high complex critical decision making was performed

## 2017-08-31 NOTE — Patient Instructions (Signed)

## 2017-09-01 ENCOUNTER — Other Ambulatory Visit: Payer: Self-pay | Admitting: Internal Medicine

## 2017-09-01 DIAGNOSIS — E782 Mixed hyperlipidemia: Secondary | ICD-10-CM

## 2017-09-01 MED ORDER — ROSUVASTATIN CALCIUM 40 MG PO TABS
ORAL_TABLET | ORAL | 1 refills | Status: DC
Start: 2017-09-01 — End: 2019-05-30

## 2017-09-02 ENCOUNTER — Encounter: Payer: Self-pay | Admitting: Internal Medicine

## 2017-09-04 LAB — BASIC METABOLIC PANEL WITH GFR
BUN: 15 mg/dL (ref 7–25)
CALCIUM: 9.7 mg/dL (ref 8.6–10.3)
CHLORIDE: 103 mmol/L (ref 98–110)
CO2: 25 mmol/L (ref 20–32)
CREATININE: 0.88 mg/dL (ref 0.70–1.33)
GFR, EST AFRICAN AMERICAN: 111 mL/min/{1.73_m2} (ref >=60)
GFR, Est Non African American: 96 mL/min/{1.73_m2} (ref >=60)
GLUCOSE: 92 mg/dL (ref 65–99)
Potassium: 4.7 mmol/L (ref 3.5–5.3)
Sodium: 137 mmol/L (ref 135–146)

## 2017-09-04 LAB — LIPID PANEL
CHOLESTEROL: 190 mg/dL (ref <200)
HDL: 61 mg/dL (ref >40)
LDL CHOLESTEROL (CALC): 114 mg/dL — AB
Non-HDL Cholesterol (Calc): 129 mg/dL (calc) (ref <130)
TRIGLYCERIDES: 61 mg/dL (ref <150)
Total CHOL/HDL Ratio: 3.1 (calc) (ref <5.0)

## 2017-09-04 LAB — CBC WITH DIFFERENTIAL/PLATELET
Basophils Absolute: 84 cells/uL (ref 0–200)
Basophils Relative: 0.9 %
Eosinophils Absolute: 130 cells/uL (ref 15–500)
Eosinophils Relative: 1.4 %
HCT: 45.5 % (ref 38.5–50.0)
Hemoglobin: 16.1 g/dL (ref 13.2–17.1)
Lymphs Abs: 2093 cells/uL (ref 850–3900)
MCH: 31.9 pg (ref 27.0–33.0)
MCHC: 35.4 g/dL (ref 32.0–36.0)
MCV: 90.3 fL (ref 80.0–100.0)
MPV: 10.3 fL (ref 7.5–12.5)
Monocytes Relative: 7.4 %
NEUTROS PCT: 67.8 %
Neutro Abs: 6305 cells/uL (ref 1500–7800)
Platelets: 291 10*3/uL (ref 140–400)
RBC: 5.04 10*6/uL (ref 4.20–5.80)
RDW: 12.5 % (ref 11.0–15.0)
TOTAL LYMPHOCYTE: 22.5 %
WBC: 9.3 10*3/uL (ref 3.8–10.8)
WBCMIX: 688 {cells}/uL (ref 200–950)

## 2017-09-04 LAB — PSA: PSA: 1.2 ng/mL (ref <=4.0)

## 2017-09-04 LAB — HEPATIC FUNCTION PANEL
AG RATIO: 1.5 (calc) (ref 1.0–2.5)
ALBUMIN MSPROF: 4.4 g/dL (ref 3.6–5.1)
ALT: 30 U/L (ref 9–46)
AST: 32 U/L (ref 10–35)
Alkaline phosphatase (APISO): 72 U/L (ref 40–115)
Bilirubin, Direct: 0.1 mg/dL (ref 0.0–0.2)
GLOBULIN: 3 g/dL (ref 1.9–3.7)
Indirect Bilirubin: 0.3 mg/dL (calc) (ref 0.2–1.2)
TOTAL PROTEIN: 7.4 g/dL (ref 6.1–8.1)
Total Bilirubin: 0.4 mg/dL (ref 0.2–1.2)

## 2017-09-04 LAB — VITAMIN D 25 HYDROXY (VIT D DEFICIENCY, FRACTURES): VIT D 25 HYDROXY: 33 ng/mL (ref 30–100)

## 2017-09-04 LAB — HEMOGLOBIN A1C
Hgb A1c MFr Bld: 5.3 % of total Hgb (ref <5.7)
Mean Plasma Glucose: 105 (calc)
eAG (mmol/L): 5.8 (calc)

## 2017-09-04 LAB — HEPATITIS C RNA QUANTITATIVE
HCV Quantitative Log: 6.84 Log IU/mL — ABNORMAL HIGH
HCV RNA, PCR, QN: 6930000 IU/mL — ABNORMAL HIGH

## 2017-09-04 LAB — TSH: TSH: 0.43 mIU/L (ref 0.40–4.50)

## 2017-09-04 LAB — MAGNESIUM: MAGNESIUM: 2.2 mg/dL (ref 1.5–2.5)

## 2017-09-04 LAB — INSULIN, RANDOM: INSULIN: 9.8 u[IU]/mL (ref 2.0–19.6)

## 2017-09-27 ENCOUNTER — Other Ambulatory Visit: Payer: Self-pay | Admitting: Internal Medicine

## 2017-10-02 ENCOUNTER — Other Ambulatory Visit: Payer: Self-pay | Admitting: Nurse Practitioner

## 2017-10-02 DIAGNOSIS — B182 Chronic viral hepatitis C: Secondary | ICD-10-CM

## 2017-10-02 DIAGNOSIS — K740 Hepatic fibrosis: Secondary | ICD-10-CM | POA: Diagnosis not present

## 2017-10-06 ENCOUNTER — Other Ambulatory Visit: Payer: Self-pay

## 2017-10-06 ENCOUNTER — Emergency Department (HOSPITAL_COMMUNITY)
Admission: EM | Admit: 2017-10-06 | Discharge: 2017-10-06 | Disposition: A | Discharge status: Home / Self Care | Payer: Medicare Other | Attending: Emergency Medicine | Admitting: Emergency Medicine

## 2017-10-06 ENCOUNTER — Encounter (HOSPITAL_COMMUNITY): Payer: Self-pay

## 2017-10-06 DIAGNOSIS — S61512A Laceration without foreign body of left wrist, initial encounter: Secondary | ICD-10-CM | POA: Diagnosis not present

## 2017-10-06 DIAGNOSIS — Z79899 Other long term (current) drug therapy: Secondary | ICD-10-CM | POA: Diagnosis not present

## 2017-10-06 DIAGNOSIS — Y999 Unspecified external cause status: Secondary | ICD-10-CM | POA: Diagnosis not present

## 2017-10-06 DIAGNOSIS — F17210 Nicotine dependence, cigarettes, uncomplicated: Secondary | ICD-10-CM | POA: Insufficient documentation

## 2017-10-06 DIAGNOSIS — W268XXA Contact with other sharp object(s), not elsewhere classified, initial encounter: Secondary | ICD-10-CM | POA: Diagnosis not present

## 2017-10-06 DIAGNOSIS — Y939 Activity, unspecified: Secondary | ICD-10-CM | POA: Insufficient documentation

## 2017-10-06 DIAGNOSIS — I10 Essential (primary) hypertension: Secondary | ICD-10-CM | POA: Insufficient documentation

## 2017-10-06 DIAGNOSIS — Y929 Unspecified place or not applicable: Secondary | ICD-10-CM | POA: Diagnosis not present

## 2017-10-06 MED ORDER — LIDOCAINE-EPINEPHRINE (PF) 2 %-1:200000 IJ SOLN
10.0000 mL | Freq: Once | INTRAMUSCULAR | Status: AC
  Administered 2017-10-06: 10 mL via INTRADERMAL
  Filled 2017-10-06: qty 20

## 2017-10-06 MED ORDER — TRAMADOL HCL 50 MG PO TABS
50.0000 mg | ORAL_TABLET | Freq: Once | ORAL | Status: AC
  Administered 2017-10-06: 50 mg via ORAL
  Filled 2017-10-06: qty 1

## 2017-10-06 NOTE — ED Notes (Signed)
ED Provider at bedside. 

## 2017-10-06 NOTE — ED Notes (Signed)
Laceration cleaned, anti-bacterial ointment applied, and patient given supplies for dressing changes.

## 2017-10-06 NOTE — ED Triage Notes (Signed)
Pt was using a chain saw and has two lacerations tot he top of left wrist. Bleeding controled

## 2017-10-06 NOTE — ED Provider Notes (Signed)
[facility] EMERGENCY DEPARTMENT Provider Note   CSN: [id number] Arrival date & time: 10/06/17  1733     History   Chief Complaint Chief Complaint  Patient presents with  . Hand Injury    HPI Howard Fox is a 57 y.o. male.  HPI   57 year old male with history of hepatitis C presenting for evaluation of wrist injury.  Patient report approximately 2 hours ago he was on a tree, trying to trim up some limp, and while holding his chainsaw, he accidentally cut his left wrist with a chainsaw.  He reported onset of sharp pain to the affected area and currently rates as 6 out of 10, nonradiating without any associated numbness.  Laceration is to the dorsum of his wrist.  He is able to move the wrist and denies any other injury.  He is up-to-date with tetanus.  He went to urgent care but was recommended to come to the ER for further evaluation.  No specific treatment tried.  He is right-hand dominant.  Past Medical History:  Diagnosis Date  . Bipolar 1 disorder ([place])   . Hepatitis C   . Hypertension   . Kidney stones     Patient Active Problem List   Diagnosis Date Noted  . Hepatitis C, chronic ([place]) 04/01/2015  . Tobacco use disorder 04/01/2015  . Other abnormal glucose 12/10/2014  . Obesity (BMI 30.0-34.9) 12/10/2014  . Medication management 12/03/2013  . Essential hypertension 04/25/2013  . Mixed hyperlipidemia 04/25/2013  . Vitamin D deficiency 04/25/2013  . Bipolar disorder ([place]) 02/19/2007    Past Surgical History:  Procedure Laterality Date  . CHOLECYSTECTOMY          Home Medications    Prior to Admission medications   Medication Sig Start Date End Date Taking? Authorizing Provider  albuterol (PROVENTIL HFA;VENTOLIN HFA) 108 (90 Base) MCG/ACT inhaler INHALE 2 PUFFS BY MOUTH EVERY 4 HOURS AS NEEDED FOR WHEEZING AND FOR SHORTNESS OF BREATH 08/21/17   Unk Pinto, MD  ALPRAZolam Duanne Moron) 1 MG tablet Take 1/2 to 1 tablet 2 to 3 x / day ONLY if  needed for Anxiety Attacks & please try to limit to 5 days /week to avoid addiction 09/27/17   Unk Pinto, MD  amitriptyline (ELAVIL) 25 MG tablet TAKE ONE TABLET BY MOUTH 4 TIMES DAILY FOR MOOD 04/01/15   Vicie Mutters, PA-C  aspirin EC 325 MG tablet Take 650 mg by mouth daily.    [provider]  carisoprodol (SOMA) 350 MG tablet Take 350 mg 3 (three) times daily by mouth.    [provider]  citalopram (CELEXA) 40 MG tablet TAKE 1 TABLET BY MOUTH ONCE DAILY 07/26/17   Unk Pinto, MD  ibuprofen (ADVIL,MOTRIN) 200 MG tablet Take 800 mg by mouth every 8 (eight) hours as needed for moderate pain.     [provider]  lisinopril-hydrochlorothiazide (PRINZIDE,ZESTORETIC) 20-25 MG tablet TAKE ONE TABLET BY MOUTH ONCE DAILY 04/26/17   Unk Pinto, MD  pravastatin (PRAVACHOL) 40 MG tablet Take 1 tablet (40 mg total) by mouth daily. 07/26/17   Unk Pinto, MD  rosuvastatin (CRESTOR) 40 MG tablet Take 1/2 to 1 tablet daily or as directed for Cholesterol 09/01/17   Unk Pinto, MD    Family History Family History  Problem Relation Age of Onset  . Mental illness Mother        Bipolar  . Stroke Father   . Arthritis Father     Social History Social History  Tobacco Use  . Smoking status: Current Every Day Smoker    Packs/day: 0.50  . Smokeless tobacco: Never Used  Substance Use Topics  . Alcohol use: Yes    Alcohol/week: 1.2 oz    Types: 2 Cans of beer per week  . Drug use: No     Allergies   Codeine   Review of Systems Review of Systems  Constitutional: Negative for fever.  Skin: Positive for wound.  Neurological: Negative for numbness.     Physical Exam Updated Vital Signs BP 95/62 (BP Location: Left Arm)   Pulse 78   Temp 98 F (36.7 C) (Oral)   Resp 18   Ht 5\' 11"  (1.803 m)   Wt 103 kg (227 lb)   SpO2 97%   BMI 31.66 kg/m   Physical Exam  Constitutional: He appears well-developed and well-nourished. No distress.    HENT:  Head: Atraumatic.  Eyes: Conjunctivae are normal.  Neck: Neck supple.  Cardiovascular: Intact distal pulses.  Musculoskeletal: He exhibits tenderness (Left wrist: At the dorsum of the wrist, they are to linear superficial laceration measuring approximately 3 cm and 4 cm respectively.  No active bleeding, no muscle bone nerve or vessel involvement.  Sensation intact distally.).  Left radial pulse 2+.  Normal left grip strength with normal left wrist flexion extension supination and pronation.  Neurological: He is alert.  Skin: No rash noted.  Psychiatric: He has a normal mood and affect.  Nursing note and vitals reviewed.    ED Treatments / Results  Labs (all labs ordered are listed, but only abnormal results are displayed) Labs Reviewed - No data to display  EKG None  Radiology No results found.  Procedures .Marland KitchenLaceration Repair Date/Time: 10/06/2017 7:31 PM Performed by: Domenic Moras, PA-C Authorized by: Domenic Moras, PA-C   Consent:    Consent obtained:  Verbal   Consent given by:  Patient   Risks discussed:  Infection and nerve damage   Alternatives discussed:  Referral Anesthesia (see MAR for exact dosages):    Anesthesia method:  Local infiltration   Local anesthetic:  Lidocaine 2% WITH epi Laceration details:    Location: Left wrist, dorsum (distal)   Length (cm):  3   Depth (mm):  2 Repair type:    Repair type:  Simple Pre-procedure details:    Preparation:  Patient was prepped and draped in usual sterile fashion Exploration:    Hemostasis achieved with:  Direct pressure   Wound exploration: wound explored through full range of motion and entire depth of wound probed and visualized     Wound extent: no muscle damage noted, no nerve damage noted, no tendon damage noted and no underlying fracture noted     Contaminated: no   Treatment:    Area cleansed with:  Saline   Amount of cleaning:  Standard   Irrigation solution:  Sterile saline   Irrigation  volume:  200   Irrigation method:  Pressure wash   Visualized foreign bodies/material removed: no   Skin repair:    Repair method:  Sutures   Suture size:  5-0   Suture material:  Prolene   Suture technique:  Simple interrupted   Number of sutures:  6 Approximation:    Approximation:  Close Post-procedure details:    Dressing:  Non-adherent dressing   Patient tolerance of procedure:  Tolerated well, no immediate complications .Marland KitchenLaceration Repair Date/Time: 10/06/2017 7:33 PM Performed by: Domenic Moras, PA-C Authorized by: Domenic Moras, PA-C   Consent:  Consent obtained:  Verbal   Consent given by:  Patient   Risks discussed:  Infection   Alternatives discussed:  Referral Anesthesia (see MAR for exact dosages):    Anesthesia method:  Local infiltration   Local anesthetic:  Lidocaine 2% WITH epi Laceration details:    Location: L wrist, dorsum (proximal)   Length (cm):  4   Depth (mm):  3 Repair type:    Repair type:  Simple Pre-procedure details:    Preparation:  Patient was prepped and draped in usual sterile fashion Exploration:    Hemostasis achieved with:  Direct pressure   Wound exploration: wound explored through full range of motion and entire depth of wound probed and visualized     Wound extent: no muscle damage noted, no nerve damage noted and no underlying fracture noted     Contaminated: no   Treatment:    Area cleansed with:  Saline   Amount of cleaning:  Standard   Irrigation solution:  Sterile saline   Irrigation volume:  200   Irrigation method:  Pressure wash   Visualized foreign bodies/material removed: no   Skin repair:    Repair method:  Sutures   Suture size:  5-0   Suture material:  Prolene   Suture technique:  Simple interrupted   Number of sutures:  7 Approximation:    Approximation:  Close Post-procedure details:    Dressing:  Non-adherent dressing   Patient tolerance of procedure:  Tolerated well, no immediate complications       (including critical care time)  Medications Ordered in ED Medications - No data to display   Initial Impression / Assessment and Plan / ED Course  I have reviewed the triage vital signs and the nursing notes.  Pertinent labs & imaging results that were available during my care of the patient were reviewed by me and considered in my medical decision making (see chart for details).     BP 95/62 (BP Location: Left Arm)   Pulse 78   Temp 98 F (36.7 C) (Oral)   Resp 18   Ht 5\' 11"  (1.803 m)   Wt 103 kg (227 lb)   SpO2 97%   BMI 31.66 kg/m    Final Clinical Impressions(s) / ED Diagnoses   Final diagnoses:  Laceration of left wrist, initial encounter    ED Discharge Orders    None     6:26 PM Pt accidentally cut his L wrist with a chain saw earlier today.  Injury to dorsum of wrist.  Lacs are superficial without any concerning neurovascular compromise, no muscle or bone involvement.  Will perform lac repair.    7:37 PM Successful lac repair.  Pt tolerates well.  Sutures removal in 1 week.    Domenic Moras, PA-C 10/06/17 Artist Pais    Sherwood Gambler, MD 10/06/17 2059

## 2017-10-09 ENCOUNTER — Encounter: Payer: Medicare Other | Admitting: Family

## 2017-10-11 NOTE — Progress Notes (Deleted)
Subjective:    Howard Fox is a 57 y.o. male who obtained a laceration 6 days ago, which required closure with 6 sutures which were placed after evaluation in the ED. Mechanism of injury: accidental injury while using a chainsaw.  He denies pain, redness, or drainage from the wound. His last tetanus was 3 years ago.  {Common ambulatory SmartLinks:19316}  Review of Systems {ros; complete:30496}    Objective:    There were no vitals taken for this visit. Injury exam:  A {1-20:14706} cm laceration noted on the *** is healing well, {with/without:5700} evidence of infection.    Assessment:    Laceration is healing well, {with/without:5700} evidence of infection.    Plan:       Laceration of left wrist, subsequent encounter  Encounter for removal of sutures 1. {1-10:18281} {sutures/staples:11474} were removed. 2. Wound care discussed. 3. Follow up as needed.

## 2017-10-12 ENCOUNTER — Ambulatory Visit (INDEPENDENT_AMBULATORY_CARE_PROVIDER_SITE_OTHER): Payer: Medicare Other | Admitting: Adult Health

## 2017-10-12 ENCOUNTER — Encounter: Payer: Self-pay | Admitting: Adult Health

## 2017-10-12 VITALS — BP 124/66 | HR 97 | Temp 97.5°F | Ht 71.5 in | Wt 230.0 lb

## 2017-10-12 MED ORDER — PRAVASTATIN SODIUM 40 MG PO TABS: 40.0000 mg | ORAL_TABLET | Freq: Every day | ORAL | 1 refills | Status: DC

## 2017-10-12 NOTE — Progress Notes (Deleted)
Subjective:    Howard Fox is a 57 y.o. male who obtained superficial lacerations x 2 to left wrist dorsum 7 days ago, which required closure with 13 sutures. Mechanism of injury: superficial injury by chainsaw. He denies pain, redness, or drainage from the wound. His last tetanus was 3 years ago.  The following portions of the patient's history were reviewed and updated as appropriate: allergies, current medications, past family history, past medical history, past social history, past surgical history and problem list.  Review of Systems {ros; complete:30496}    Objective:    There were no vitals taken for this visit. Injury exam:  A {1-20:14706} cm laceration noted on the *** is healing well, {with/without:5700} evidence of infection.    Assessment:    Laceration is healing well, {with/without:5700} evidence of infection.    Plan:     1. {1-10:18281} {sutures/staples:11474} were removed. 2. Wound care discussed. 3. Follow up as needed.

## 2017-10-12 NOTE — Progress Notes (Signed)
Appointment rescheduled for tomorrow °

## 2017-10-13 ENCOUNTER — Ambulatory Visit: Payer: Medicare Other | Admitting: Adult Health

## 2017-10-15 NOTE — Progress Notes (Signed)
Subjective:    Howard Fox is a 57 y.o. male who obtained superficial lacerations x 2 to left wrist dorsum 7 days ago, which required closure with 13 sutures. Mechanism of injury: superficial injury by chainsaw. He denies pain or drainage from the wound, but endorses surrounding redness (improved from 3 days ago). His last tetanus was 3 years ago.  The following portions of the patient's history were reviewed and updated as appropriate: allergies, current medications, past family history, past medical history, past social history, past surgical history and problem list.  Review of Systems Pertinent items noted in HPI and remainder of comprehensive ROS otherwise negative.    Objective:    There were no vitals taken for this visit. Injury exam:  A 4 cm and 3 cm laceration noted on the left dorsum of wrist is healing well, with evidence of infection (surrounding erythema/inflammation).    Assessment:    Laceration is healing well, with evidence of infection. Surrounding areas injected with some inflammation, no discharge.    Plan:     1. 13 sutures were removed. 2. Wound care discussed. 3. Follow up as needed.   4. Keflex 500 mg QID x 10 days

## 2017-10-17 ENCOUNTER — Ambulatory Visit (INDEPENDENT_AMBULATORY_CARE_PROVIDER_SITE_OTHER): Payer: Medicare Other | Admitting: Adult Health

## 2017-10-17 ENCOUNTER — Encounter: Payer: Self-pay | Admitting: Adult Health

## 2017-10-17 VITALS — BP 124/76 | HR 83 | Temp 97.5°F | Ht 71.5 in | Wt 231.0 lb

## 2017-10-17 DIAGNOSIS — Z4802 Encounter for removal of sutures: Secondary | ICD-10-CM | POA: Diagnosis not present

## 2017-10-17 DIAGNOSIS — S61521D Laceration with foreign body of right wrist, subsequent encounter: Secondary | ICD-10-CM | POA: Diagnosis not present

## 2017-10-17 DIAGNOSIS — S61512D Laceration without foreign body of left wrist, subsequent encounter: Secondary | ICD-10-CM

## 2017-10-17 MED ORDER — CEPHALEXIN 500 MG PO CAPS: ORAL_CAPSULE | ORAL | 0 refills | Status: DC

## 2017-10-17 NOTE — Patient Instructions (Signed)
Wound Care, Adult Taking care of your wound properly can help to prevent pain and infection. It can also help your wound to heal more quickly. How is this treated? Wound care  Follow instructions from your health care provider about how to take care of your wound. Make sure you: ? Wash your hands with soap and water before you change the bandage (dressing). If soap and water are not available, use hand sanitizer. ? Change your dressing as told by your health care provider. ? Leave stitches (sutures), skin glue, or adhesive strips in place. These skin closures may need to stay in place for 2 weeks or longer. If adhesive strip edges start to loosen and curl up, you may trim the loose edges. Do not remove adhesive strips completely unless your health care provider tells you to do that.  Check your wound area every day for signs of infection. Check for: ? More redness, swelling, or pain. ? More fluid or blood. ? Warmth. ? Pus or a bad smell.  Ask your health care provider if you should clean the wound with mild soap and water. Doing this may include: ? Using a clean towel to pat the wound dry after cleaning it. Do not rub or scrub the wound. ? Applying a cream or ointment. Do this only as told by your health care provider. ? Covering the incision with a clean dressing.  Ask your health care provider when you can leave the wound uncovered. Medicines   If you were prescribed an antibiotic medicine, cream, or ointment, take or use the antibiotic as told by your health care provider. Do not stop taking or using the antibiotic even if your condition improves.  Take over-the-counter and prescription medicines only as told by your health care provider. If you were prescribed pain medicine, take it at least 30 minutes before doing any wound care or as told by your health care provider. General instructions  Return to your normal activities as told by your health care provider. Ask your  health care provider what activities are safe.  Do not scratch or pick at the wound.  Keep all follow-up visits as told by your health care provider. This is important.  Eat a diet that includes protein, vitamin A, vitamin C, and other nutrient-rich foods. These help the wound heal: ? Protein-rich foods include meat, dairy, beans, nuts, and other sources. ? Vitamin A-rich foods include carrots and dark green, leafy vegetables. ? Vitamin C-rich foods include citrus, tomatoes, and other fruits and vegetables. ? Nutrient-rich foods have protein, carbohydrates, fat, vitamins, or minerals. Eat a variety of healthy foods including vegetables, fruits, and whole grains. Contact a health care provider if:  You received a tetanus shot and you have swelling, severe pain, redness, or bleeding at the injection site.  Your pain is not controlled with medicine.  You have more redness, swelling, or pain around the wound.  You have more fluid or blood coming from the wound.  Your wound feels warm to the touch.  You have pus or a bad smell coming from the wound.  You have a fever or chills.  You are nauseous or you vomit.  You are dizzy. Get help right away if:  You have a red streak going away from your wound.  The edges of the wound open up and separate.  Your wound is bleeding and the bleeding does not stop with gentle pressure.  You have a rash.  You faint.  You have trouble breathing. This information is not intended to replace advice given to you by your health care provider. Make sure you discuss any questions you have with your health care provider. Document Released: 02/16/2008 Document Revised: 01/06/2016 Document Reviewed: 11/24/2015 Elsevier Interactive Patient Education  2017 [place]. Cephalexin tablets or capsules What is this medicine? CEPHALEXIN (sef a LEX in) is a cephalosporin antibiotic. It is used to treat certain kinds of bacterial infections It will not work  for colds, flu, or other viral infections. This medicine may be used for other purposes; ask your health care provider or pharmacist if you have questions. COMMON BRAND NAME(S): Biocef, Daxbia, Keflex, Keftab What should I tell my health care provider before I take this medicine? They need to know if you have any of these conditions: -kidney disease -stomach or intestine problems, especially colitis -an unusual or allergic reaction to cephalexin, other cephalosporins, penicillins, other antibiotics, medicines, foods, dyes or preservatives -pregnant or trying to get pregnant -breast-feeding How should I use this medicine? Take this medicine by mouth with a full glass of water. Follow the directions on the prescription label. This medicine can be taken with or without food. Take your medicine at regular intervals. Do not take your medicine more often than directed. Take all of your medicine as directed even if you think you are better. Do not skip doses or stop your medicine early. Talk to your pediatrician regarding the use of this medicine in children. While this drug may be prescribed for selected conditions, precautions do apply. Overdosage: If you think you have taken too much of this medicine contact a poison control center or emergency room at once. NOTE: This medicine is only for you. Do not share this medicine with others. What if I miss a dose? If you miss a dose, take it as soon as you can. If it is almost time for your next dose, take only that dose. Do not take double or extra doses. There should be at least 4 to 6 hours between doses. What may interact with this medicine? -probenecid -some other antibiotics This list may not describe all possible interactions. Give your health care provider a list of all the medicines, herbs, non-prescription drugs, or dietary supplements you use. Also tell them if you smoke, drink alcohol, or use illegal drugs. Some items may interact with your  medicine. What should I watch for while using this medicine? Tell your doctor or health care professional if your symptoms do not begin to improve in a few days. Do not treat diarrhea with over the counter products. Contact your doctor if you have diarrhea that lasts more than 2 days or if it is severe and watery. If you have diabetes, you may get a false-positive result for sugar in your urine. Check with your doctor or health care professional. What side effects may I notice from receiving this medicine? Side effects that you should report to your doctor or health care professional as soon as possible: -allergic reactions like skin rash, itching or hives, swelling of the face, lips, or tongue -breathing problems -pain or trouble passing urine -redness, blistering, peeling or loosening of the skin, including inside the mouth -severe or watery diarrhea -unusually weak or tired -yellowing of the eyes, skin Side effects that usually do not require medical attention (report to your doctor or health care professional if they continue or are bothersome): -gas or heartburn -genital or anal irritation -headache -joint or muscle pain -nausea, vomiting This  list may not describe all possible side effects. Call your doctor for medical advice about side effects. You may report side effects to FDA at 1-800-FDA-1088. Where should I keep my medicine? Keep out of the reach of children. Store at room temperature between 59 and 86 degrees F (15 and 30 degrees C). Throw away any unused medicine after the expiration date. NOTE: This sheet is a summary. It may not cover all possible information. If you have questions about this medicine, talk to your doctor, pharmacist, or health care provider.  2018 Elsevier/Gold Standard (2007-08-13 17:09:13)

## 2017-10-24 ENCOUNTER — Other Ambulatory Visit: Payer: Medicare Other

## 2017-10-27 ENCOUNTER — Other Ambulatory Visit: Payer: Self-pay | Admitting: Physician Assistant

## 2017-10-28 ENCOUNTER — Other Ambulatory Visit: Payer: Self-pay

## 2017-10-28 ENCOUNTER — Emergency Department (HOSPITAL_COMMUNITY)
Admission: EM | Admit: 2017-10-28 | Discharge: 2017-10-28 | Discharge status: Against Medical Advice | Payer: Medicare Other | Attending: Emergency Medicine | Admitting: Emergency Medicine

## 2017-10-28 ENCOUNTER — Encounter (HOSPITAL_COMMUNITY): Payer: Self-pay | Admitting: Emergency Medicine

## 2017-10-28 DIAGNOSIS — Z7982 Long term (current) use of aspirin: Secondary | ICD-10-CM | POA: Insufficient documentation

## 2017-10-28 DIAGNOSIS — R339 Retention of urine, unspecified: Secondary | ICD-10-CM | POA: Diagnosis present

## 2017-10-28 DIAGNOSIS — R39198 Other difficulties with micturition: Secondary | ICD-10-CM | POA: Insufficient documentation

## 2017-10-28 DIAGNOSIS — E782 Mixed hyperlipidemia: Secondary | ICD-10-CM | POA: Insufficient documentation

## 2017-10-28 DIAGNOSIS — F17200 Nicotine dependence, unspecified, uncomplicated: Secondary | ICD-10-CM | POA: Insufficient documentation

## 2017-10-28 DIAGNOSIS — I10 Essential (primary) hypertension: Secondary | ICD-10-CM | POA: Diagnosis not present

## 2017-10-28 DIAGNOSIS — Z79899 Other long term (current) drug therapy: Secondary | ICD-10-CM | POA: Diagnosis not present

## 2017-10-28 LAB — URINALYSIS, ROUTINE W REFLEX MICROSCOPIC
Bacteria, UA: NONE SEEN
Bilirubin Urine: NEGATIVE
Glucose, UA: NEGATIVE mg/dL
Ketones, ur: NEGATIVE mg/dL
LEUKOCYTES UA: NEGATIVE
NITRITE: NEGATIVE
PROTEIN: NEGATIVE mg/dL
Specific Gravity, Urine: 1.001 — ABNORMAL LOW (ref 1.005–1.030)
pH: 6 (ref 5.0–8.0)

## 2017-10-28 NOTE — ED Triage Notes (Signed)
Pt presents to ED for assessment due to difficulty urinating since yesterday.  Difficulty starting his stream, difficulty keeping it going, with a hx of kidney stones (2 in the last 6 months).  Denies sharp pain, c/o discomfort due to difficulty.  Abdomen is soft.

## 2017-10-28 NOTE — ED Notes (Signed)
ED Provider at bedside. 

## 2017-10-28 NOTE — ED Notes (Signed)
Pt stating he feels completely relieved after providing urine sample; states "if you give me a urinal I could probably pee some more." Urinal provided. Pt stating he is ready to be discharged. This RN encouraged pt to wait to be seen by provider; agreeable to stay at this time.

## 2017-10-28 NOTE — ED Notes (Signed)
Pt refusing to stay for urine results; refuses bladder scan at this time. Leaving AMA. EDP made aware.

## 2017-10-28 NOTE — ED Provider Notes (Cosign Needed)
[facility] EMERGENCY DEPARTMENT Provider Note   CSN: [id number] Arrival date & time: 10/28/17  1111     History   Chief Complaint Chief Complaint  Patient presents with  . Urinary Retention    HPI Howard Fox is a 57 y.o. male with a past medical history of bipolar, hepatitis C, hypertension, kidney stones, who presents today for evaluation of urinary retention and difficulty urinating since yesterday.  When I attempt to evaluate patient, he tells me that he has urinated a fair amount, does not wish for further evaluation or physical exam and would like to leave now.  HPI  Past Medical History:  Diagnosis Date  . Bipolar 1 disorder ([place])   . Hepatitis C   . Hypertension   . Kidney stones     Patient Active Problem List   Diagnosis Date Noted  . Hepatitis C, chronic ([place]) 04/01/2015  . Tobacco use disorder 04/01/2015  . Other abnormal glucose 12/10/2014  . Obesity (BMI 30.0-34.9) 12/10/2014  . Medication management 12/03/2013  . Essential hypertension 04/25/2013  . Mixed hyperlipidemia 04/25/2013  . Vitamin D deficiency 04/25/2013  . Bipolar disorder ([employer]) 02/19/2007    Past Surgical History:  Procedure Laterality Date  . CHOLECYSTECTOMY          Home Medications    Prior to Admission medications   Medication Sig Start Date End Date Taking? Authorizing Provider  albuterol (PROVENTIL HFA;VENTOLIN HFA) 108 (90 Base) MCG/ACT inhaler INHALE 2 PUFFS BY MOUTH EVERY 4 HOURS AS NEEDED FOR WHEEZING AND FOR SHORTNESS OF BREATH 08/21/17   Unk Pinto, MD  ALPRAZolam Duanne Moron) 1 MG tablet Take 1/2 to 1 tablet 2 to 3 x / day ONLY if needed for Anxiety Attacks & please try to limit to 5 days /week to avoid addiction 09/27/17   Unk Pinto, MD  amitriptyline (ELAVIL) 25 MG tablet TAKE ONE TABLET BY MOUTH 4 TIMES DAILY FOR MOOD 04/01/15   Vicie Mutters, PA-C  amitriptyline (ELAVIL) 25 MG tablet TAKE 1 TABLET BY MOUTH 4 TIMES DAILY FOR MOOD 10/27/17    Unk Pinto, MD  aspirin EC 325 MG tablet Take 325 mg by mouth daily.     [provider]  carisoprodol (SOMA) 350 MG tablet Take 350 mg by mouth as needed.     [provider]  cephALEXin (KEFLEX) 500 MG capsule Take 1 capsule 4 x/day after meals & bedtime for infection 10/17/17   Liane Comber, NP  Cholecalciferol (VITAMIN D3) 10000 units capsule Take 10,000 Units by mouth daily.    [provider]  citalopram (CELEXA) 40 MG tablet TAKE 1 TABLET BY MOUTH ONCE DAILY 07/26/17   Unk Pinto, MD  ibuprofen (ADVIL,MOTRIN) 200 MG tablet Take 800 mg by mouth every 8 (eight) hours as needed for moderate pain.     [provider]  lisinopril-hydrochlorothiazide (PRINZIDE,ZESTORETIC) 20-25 MG tablet TAKE ONE TABLET BY MOUTH ONCE DAILY 04/26/17   Unk Pinto, MD  pravastatin (PRAVACHOL) 40 MG tablet Take 1 tablet (40 mg total) by mouth daily. 10/12/17   Liane Comber, NP  rosuvastatin (CRESTOR) 40 MG tablet Take 1/2 to 1 tablet daily or as directed for Cholesterol 09/01/17   Unk Pinto, MD    Family History Family History  Problem Relation Age of Onset  . Mental illness Mother        Bipolar  . Stroke Father   . Arthritis Father     Social History Social History   Tobacco Use  .  Smoking status: Current Every Day Smoker    Packs/day: 0.50  . Smokeless tobacco: Never Used  Substance Use Topics  . Alcohol use: Yes    Alcohol/week: 1.2 oz    Types: 2 Cans of beer per week  . Drug use: No     Allergies   Codeine   Review of Systems Review of Systems  Genitourinary: Positive for difficulty urinating (Has resolved).     Physical Exam Updated Vital Signs BP (!) 152/80 (BP Location: Right Arm)   Pulse 85   Temp 98.6 F (37 C) (Oral)   Resp 17   SpO2 98%   Physical Exam  Constitutional: He appears well-developed and well-nourished. No distress.  HENT:  Head: Normocephalic.  Eyes: Conjunctivae are normal.  Neck: Normal  range of motion.  Cardiovascular: Normal rate.  Pulmonary/Chest: Effort normal. No stridor. No respiratory distress.  Abdominal: He exhibits no distension.  Musculoskeletal:  Moves all extremities. Gait with out difficulties.   Neurological: He is alert.  Skin: Skin is dry. He is not diaphoretic.  Psychiatric: He has a normal mood and affect. His behavior is normal.  Nursing note and vitals reviewed.    ED Treatments / Results  Labs (all labs ordered are listed, but only abnormal results are displayed) Labs Reviewed  URINALYSIS, ROUTINE W REFLEX MICROSCOPIC    EKG None  Radiology No results found.  Procedures Procedures (including critical care time)  Medications Ordered in ED Medications - No data to display   Initial Impression / Assessment and Plan / ED Course  I have reviewed the triage vital signs and the nursing notes.  Pertinent labs & imaging results that were available during my care of the patient were reviewed by me and considered in my medical decision making (see chart for details).    Patient presents today for evaluation of reported urinary retention.  When I attempted to see him he stated that he felt better and wished to go home.  He made the informed decision to decline providing additional history, work-up, or evaluation.  I did ask him if we could perform a bladder scan, and explained that sometimes with urinary retention people may have a difficult time urinating and then can partially emptied her bladder while still having a significant amount of urine left in the bladder, and that he should at least stay for a bladder scan, urinalysis, and allow me to complete a physical exam.  I explained to him that failure to do so could result in significant complications up to and including renal failure, or death.  Patient declined additional evaluation.  Will leave AMA.   Final Clinical Impressions(s) / ED Diagnoses   Final diagnoses:  Difficulty urinating     ED Discharge Orders    None       Lorin Glass, [place] 10/28/17 1241

## 2017-11-01 ENCOUNTER — Ambulatory Visit
Admission: RE | Admit: 2017-11-01 | Discharge: 2017-11-01 | Disposition: A | Discharge status: Home / Self Care | Payer: Medicare Other | Source: Ambulatory Visit | Attending: Nurse Practitioner | Admitting: Nurse Practitioner

## 2017-11-01 DIAGNOSIS — B182 Chronic viral hepatitis C: Secondary | ICD-10-CM

## 2017-11-01 DIAGNOSIS — B1920 Unspecified viral hepatitis C without hepatic coma: Secondary | ICD-10-CM | POA: Diagnosis not present

## 2017-11-26 ENCOUNTER — Other Ambulatory Visit: Payer: Self-pay | Admitting: Internal Medicine

## 2017-11-26 DIAGNOSIS — F3170 Bipolar disorder, currently in remission, most recent episode unspecified: Secondary | ICD-10-CM

## 2017-11-26 MED ORDER — ALPRAZOLAM 1 MG PO TABS: ORAL_TABLET | ORAL | 0 refills | Status: DC

## 2017-11-26 MED ORDER — ALPRAZOLAM 1 MG PO TABS: ORAL_TABLET | ORAL | 1 refills | Status: DC

## 2017-11-27 ENCOUNTER — Telehealth: Payer: Self-pay | Admitting: *Deleted

## 2017-11-27 DIAGNOSIS — K740 Hepatic fibrosis: Secondary | ICD-10-CM | POA: Diagnosis not present

## 2017-11-27 DIAGNOSIS — B182 Chronic viral hepatitis C: Secondary | ICD-10-CM | POA: Diagnosis not present

## 2017-11-27 NOTE — Telephone Encounter (Signed)
Patient called and asked why his Xanax refill was for only #30 tablets. Per Dr Melford Aase, the patient was informed the doctors are trying to wean patient off Xanax, due to its addictive nature.  The patient was offered an RX for Buspar, which he declined at this time.

## 2017-12-27 ENCOUNTER — Other Ambulatory Visit: Payer: Self-pay | Admitting: Internal Medicine

## 2017-12-27 DIAGNOSIS — F3170 Bipolar disorder, currently in remission, most recent episode unspecified: Secondary | ICD-10-CM

## 2017-12-27 MED ORDER — ALPRAZOLAM 1 MG PO TABS: ORAL_TABLET | ORAL | 0 refills | Status: DC

## 2018-01-01 NOTE — Progress Notes (Signed)
MEDICARE ANNUAL WELLNESS VISIT AND FOLLOW UP Assessment:    Encounter for annual medicare wellness visit  Essential hypertension - continue medications, DASH diet, exercise and monitor at home. Call if greater than 130/80.  - DG Chest 2 View; Future - CBC with Differential/Platelet - CMP/GFR - TSH   Abnormal glucose Discussed general issues about diabetes pathophysiology and management., Educational material distributed., Suggested low cholesterol diet., Encouraged aerobic exercise., Discussed foot care., Reminded to get yearly retinal exam. - Hemoglobin A1c  Obesity Obesity with co morbidities- long discussion about weight loss, diet, and exercise   Mixed hyperlipidemia -continue medications, check lipids, decrease fatty foods, increase activity.  - Lipid panel  Vitamin D deficiency - Vit D  25 hydroxy (rtn osteoporosis monitoring)  Medication management - Magnesium   Bipolar disorder in full remission, most recent episode unspecified type ([place]) Controlled on medications   Chronic hepatitis C without hepatic coma ([place]) Followed by hep C clinic; monitor LFTs  Tobacco use disorder Smoking cessation-  instruction/counseling given, counseled patient on the dangers of tobacco use, advised patient to stop smoking, and reviewed strategies to maximize success, patient not ready to quit at this time.  - DG Chest 2 View; Future  Bronchitis Continue keflex to complete script, call if symptoms worsen after completion Prednisone and promethazine DM sent in  Get CXR STOP smoking Breo sample provided, reminded to rinse out mouth after use, can get refill if needed  Over 30 minutes of exam, counseling, chart review, and critical decision making was performed Future Appointments  Date Time Provider [place]  04/04/2018 11:30 AM Unk Pinto, MD [facility] None  09/04/2018  3:00 PM Unk Pinto, MD [facility] None    Plan:   During the course of the visit the  patient was educated and counseled about appropriate screening and preventive services including:    Pneumococcal vaccine   Influenza vaccine  Prevnar 13  Td vaccine  Screening electrocardiogram  Colorectal cancer screening  Diabetes screening  Glaucoma screening  Nutrition counseling   Subjective:  Howard Fox is a 57 y.o. male who presents for Medicare Annual Wellness Visit and 3 month follow up for HTN, hyperlipidemia, prediabetes, and vitamin D Def. Has been on disability since 2008 for severe back injury. Patient was treated for Hepatitis C in 2008-2009. He is currently discussing ?harvoni treatment with hep C clinic.   The patient presents with productive cough x 1 week; denies other accompaniments, sore throat, dyspnea. He has cut back on smoking, and took leftover cough syrup and cephalexin 500 mg tabs, has been taking BID and has 7 day supply.   he currently continues to smoke 2-6 cigarettes a day (down from 10-12); discussed risks associated with smoking, patient is not ready to quit.   Has history of bipolar/mood DO, on celexa, amitriptylene and xanax, denies any bipolar manic depression episodes. He currently down from 3 tabs of xanax to 1 tabs daily.   BMI is Body mass index is 32.73 kg/m., he has not been working on diet and exercise. Wt Readings from Last 3 Encounters:  01/02/18 238 lb (108 kg)  10/17/17 231 lb (104.8 kg)  10/12/17 230 lb (104.3 kg)   His blood pressure has been controlled at home, runs 120-130/70's at home, today their BP is BP: 116/76  He does workout, does yard work. He denies chest pain, shortness of breath, dizziness.   He is on cholesterol medication (rosuvastatin 40 mg daily) and denies myalgias. His cholesterol is at goal.  The cholesterol last visit was:   Lab Results  Component Value Date   CHOL 190 08/31/2017   HDL 61 08/31/2017   LDLCALC 114 (H) 08/31/2017   TRIG 61 08/31/2017   CHOLHDL 3.1 08/31/2017   He has been  working on diet and exercise for prediabetes, and denies paresthesia of the feet, polydipsia, polyuria and visual disturbances. Last A1C in the office was:  Lab Results  Component Value Date   HGBA1C 5.3 08/31/2017   Patient is on Vitamin D supplement.   Lab Results  Component Value Date   VD25OH 33 08/31/2017    Treated for Hep c in 2008.  Lab Results  Component Value Date   ALT 30 08/31/2017   AST 32 08/31/2017   ALKPHOS 82 01/03/2017   BILITOT 0.4 08/31/2017    Medication Review: Current Outpatient Medications on File Prior to Visit  Medication Sig Dispense Refill  . albuterol (PROVENTIL HFA;VENTOLIN HFA) 108 (90 Base) MCG/ACT inhaler INHALE 2 PUFFS BY MOUTH EVERY 4 HOURS AS NEEDED FOR WHEEZING AND FOR SHORTNESS OF BREATH 18 each 1  . ALPRAZolam (XANAX) 1 MG tablet Take 1/2-1 tablet 2-3 x /day ONLY if needed for Anxiety Attacks & limit to 5 days /wk to avoid addiction. Cannot take with Norco or Nucynta 30 tablet 0  . amitriptyline (ELAVIL) 25 MG tablet TAKE ONE TABLET BY MOUTH 4 TIMES DAILY FOR MOOD 120 tablet 1  . aspirin EC 325 MG tablet Take 325 mg by mouth daily.     . carisoprodol (SOMA) 350 MG tablet Take 350 mg by mouth as needed.     . Cholecalciferol (VITAMIN D3) 10000 units capsule Take 10,000 Units by mouth daily.    . citalopram (CELEXA) 40 MG tablet TAKE 1 TABLET BY MOUTH ONCE DAILY 90 tablet 1  . ibuprofen (ADVIL,MOTRIN) 200 MG tablet Take 800 mg by mouth every 8 (eight) hours as needed for moderate pain.     Marland Kitchen lisinopril-hydrochlorothiazide (PRINZIDE,ZESTORETIC) 20-25 MG tablet TAKE ONE TABLET BY MOUTH ONCE DAILY 90 tablet 1  . rosuvastatin (CRESTOR) 40 MG tablet Take 1/2 to 1 tablet daily or as directed for Cholesterol 90 tablet 1   No current facility-administered medications on file prior to visit.     Current Problems (verified) Patient Active Problem List   Diagnosis Date Noted  . Hepatitis C, chronic ([place]) 04/01/2015  . Tobacco use disorder 04/01/2015   . Other abnormal glucose 12/10/2014  . Obesity (BMI 30.0-34.9) 12/10/2014  . Medication management 12/03/2013  . Essential hypertension 04/25/2013  . Mixed hyperlipidemia 04/25/2013  . Vitamin D deficiency 04/25/2013  . Bipolar disorder ([place]) 02/19/2007    Screening Tests Immunization History  Administered Date(s) Administered  . DT 04/01/2015  . Td 05/23/2004    Preventative care: Last colonoscopy: never Cologard: 10/2016 Stress test 2006 neg CXR 2010 will order [place] AB 2010  Prior vaccinations: TD or Tdap: 2016  Influenza: declines Pneumococcal: N/A Prevnar13: N/A Shingles/Zostavax: N/A  Names of Other Physician/Practitioners you currently use: 1. [facility] here for primary care 2. [facility], Dr. [facility] in [place] eye doctor, last visit 06/2017 3. [facility],  Patient Care Team: Unk Pinto, MD as PCP - General (Internal Medicine) Nickie Retort, MD as Consulting Physician (Urology)  Allergies Allergies  Allergen Reactions  . Codeine Itching    SURGICAL HISTORY He  has a past surgical history that includes Cholecystectomy. FAMILY HISTORY His family history includes Arthritis in his father; Mental illness  in his mother; Stroke in his father. SOCIAL HISTORY He  reports that he has been smoking cigarettes. He started smoking about 41 years ago. He has a 30.75 pack-year smoking history. He has never used smokeless tobacco. He reports that he drinks about 2.0 standard drinks of alcohol per week. He reports that he does not use drugs.  MEDICARE WELLNESS OBJECTIVES: Physical activity: Current Exercise Habits: Home exercise routine, Type of exercise: Other - see comments(Heavy yardwork, weed eating, mowing), Time (Minutes): 30, Frequency (Times/Week): 7, Weekly Exercise (Minutes/Week): 210, Intensity: Mild, Exercise limited by: orthopedic condition(s) Cardiac risk factors: Cardiac Risk Factors include: advanced age  (>8men, >51 women);dyslipidemia;hypertension;male gender;obesity (BMI >30kg/m2);smoking/ tobacco exposure Depression/mood screen:   Depression screen [facility] 2/9 01/02/2018  Decreased Interest 0  Down, Depressed, Hopeless 0  PHQ - 2 Score 0    ADLs:  In your present state of health, do you have any difficulty performing the following activities: 01/02/2018 09/02/2017  Hearing? N N  Vision? N N  Difficulty concentrating or making decisions? N N  Walking or climbing stairs? N N  Dressing or bathing? N N  Doing errands, shopping? N N  Some recent data might be hidden     Cognitive Testing  Alert? Yes  Normal Appearance?Yes  Oriented to person? Yes  Place? Yes   Time? Yes  Recall of three objects?  Yes  Can perform simple calculations? Yes  Displays appropriate judgment?Yes  Can read the correct time from a watch face?Yes  EOL planning: Does Patient Have a Medical Advance Directive?: Yes Type of Advance Directive: Healthcare Power of Attorney, Living will Does patient want to make changes to medical advance directive?: No - Patient declined Copy of [place] in Chart?: No - copy requested   Objective:   Today's Vitals   01/02/18 1122  BP: 116/76  Pulse: 99  Temp: (!) 97.3 F (36.3 C)  SpO2: 96%  Weight: 238 lb (108 kg)  Height: 5' 11.5" (1.816 m)   Body mass index is 32.73 kg/m.  General appearance: alert, no distress, WD/WN, male HEENT: normocephalic, sclerae anicteric, TMs pearly, nares patent, no discharge or erythema, pharynx normal Oral cavity: MMM, no lesions Neck: supple, no lymphadenopathy, no thyromegaly, no masses Heart: RRR, normal S1, S2, no murmurs Lungs: Lung sounds present throughout bilaterally, with rhonchi, scattered rales, scattered faint wheezing. No stridor Abdomen: well healing vertical scar, left sided ventral hernia, obese, +bs, soft, non tender, non distended, no masses, no hepatomegaly, no splenomegaly Musculoskeletal:  nontender, no swelling, no obvious deformity Extremities: no edema, no cyanosis, no clubbing, left great toe with new nail growing, mild thickening at the end with healthy nail at the matrix Pulses: 2+ symmetric, upper and lower extremities, normal cap refill Neurological: alert, oriented x 3, CN2-12 intact, strength normal upper extremities and lower extremities, sensation normal throughout, DTRs 2+ throughout, no cerebellar signs, gait antaglic Psychiatric: normal affect, behavior normal, pleasant   Medicare Attestation I have personally reviewed: The patient's medical and social history Their use of alcohol, tobacco or illicit drugs Their current medications and supplements The patient's functional ability including ADLs,fall risks, home safety risks, cognitive, and hearing and visual impairment Diet and physical activities Evidence for depression or mood disorders  The patient's weight, height, BMI, and visual acuity have been recorded in the chart.  I have made referrals, counseling, and provided education to the patient based on review of the above and I have provided the patient with a written personalized care  plan for preventive services.     Izora Ribas, NP   01/02/2018

## 2018-01-02 ENCOUNTER — Ambulatory Visit (INDEPENDENT_AMBULATORY_CARE_PROVIDER_SITE_OTHER): Payer: Medicare Other | Admitting: Adult Health

## 2018-01-02 ENCOUNTER — Encounter: Payer: Self-pay | Admitting: Adult Health

## 2018-01-02 VITALS — BP 116/76 | HR 99 | Temp 97.3°F | Ht 71.5 in | Wt 238.0 lb

## 2018-01-02 DIAGNOSIS — B182 Chronic viral hepatitis C: Secondary | ICD-10-CM | POA: Diagnosis not present

## 2018-01-02 DIAGNOSIS — E782 Mixed hyperlipidemia: Secondary | ICD-10-CM | POA: Diagnosis not present

## 2018-01-02 DIAGNOSIS — R7309 Other abnormal glucose: Secondary | ICD-10-CM | POA: Diagnosis not present

## 2018-01-02 DIAGNOSIS — F17200 Nicotine dependence, unspecified, uncomplicated: Secondary | ICD-10-CM | POA: Diagnosis not present

## 2018-01-02 DIAGNOSIS — E669 Obesity, unspecified: Secondary | ICD-10-CM

## 2018-01-02 DIAGNOSIS — E66811 Obesity, class 1: Secondary | ICD-10-CM

## 2018-01-02 DIAGNOSIS — F3170 Bipolar disorder, currently in remission, most recent episode unspecified: Secondary | ICD-10-CM | POA: Diagnosis not present

## 2018-01-02 DIAGNOSIS — E559 Vitamin D deficiency, unspecified: Secondary | ICD-10-CM | POA: Diagnosis not present

## 2018-01-02 DIAGNOSIS — Z0000 Encounter for general adult medical examination without abnormal findings: Secondary | ICD-10-CM | POA: Diagnosis not present

## 2018-01-02 DIAGNOSIS — I10 Essential (primary) hypertension: Secondary | ICD-10-CM | POA: Diagnosis not present

## 2018-01-02 DIAGNOSIS — Z79899 Other long term (current) drug therapy: Secondary | ICD-10-CM

## 2018-01-02 MED ORDER — FLUTICASONE FUROATE-VILANTEROL 100-25 MCG/INH IN AEPB: 1.0000 | INHALATION_SPRAY | Freq: Every day | RESPIRATORY_TRACT | 1 refills | Status: DC

## 2018-01-02 MED ORDER — PROMETHAZINE-DM 6.25-15 MG/5ML PO SYRP: 5.0000 mL | ORAL_SOLUTION | Freq: Four times a day (QID) | ORAL | 1 refills | Status: DC | PRN

## 2018-01-02 MED ORDER — PREDNISONE 20 MG PO TABS: ORAL_TABLET | ORAL | 0 refills | Status: DC

## 2018-01-02 NOTE — Patient Instructions (Addendum)
Call if you get worse after completing keflex  Breo - 1 puff daily, rinse mouth out after using (avoid thrush)   Acute Bronchitis, Adult Acute bronchitis is sudden (acute) swelling of the air tubes (bronchi) in the lungs. Acute bronchitis causes these tubes to fill with mucus, which can make it hard to breathe. It can also cause coughing or wheezing. In adults, acute bronchitis usually goes away within 2 weeks. A cough caused by bronchitis may last up to 3 weeks. Smoking, allergies, and asthma can make the condition worse. Repeated episodes of bronchitis may cause further lung problems, such as chronic obstructive pulmonary disease (COPD). What are the causes? This condition can be caused by germs and by substances that irritate the lungs, including:  Cold and flu viruses. This condition is most often caused by the same virus that causes a cold.  Bacteria.  Exposure to tobacco smoke, dust, fumes, and air pollution.  What increases the risk? This condition is more likely to develop in people who:  Have close contact with someone with acute bronchitis.  Are exposed to lung irritants, such as tobacco smoke, dust, fumes, and vapors.  Have a weak immune system.  Have a respiratory condition such as asthma.  What are the signs or symptoms? Symptoms of this condition include:  A cough.  Coughing up clear, yellow, or green mucus.  Wheezing.  Chest congestion.  Shortness of breath.  A fever.  Body aches.  Chills.  A sore throat.  How is this diagnosed? This condition is usually diagnosed with a physical exam. During the exam, your health care provider may order tests, such as chest X-rays, to rule out other conditions. He or she may also:  Test a sample of your mucus for bacterial infection.  Check the level of oxygen in your blood. This is done to check for pneumonia.  Do a chest X-ray or lung function testing to rule out pneumonia and other conditions.  Perform  blood tests.  Your health care provider will also ask about your symptoms and medical history. How is this treated? Most cases of acute bronchitis clear up over time without treatment. Your health care provider may recommend:  Drinking more fluids. Drinking more makes your mucus thinner, which may make it easier to breathe.  Taking a medicine for a fever or cough.  Taking an antibiotic medicine.  Using an inhaler to help improve shortness of breath and to control a cough.  Using a cool mist vaporizer or humidifier to make it easier to breathe.  Follow these instructions at home: Medicines  Take over-the-counter and prescription medicines only as told by your health care provider.  If you were prescribed an antibiotic, take it as told by your health care provider. Do not stop taking the antibiotic even if you start to feel better. General instructions  Get plenty of rest.  Drink enough fluids to keep your urine clear or pale yellow.  Avoid smoking and secondhand smoke. Exposure to cigarette smoke or irritating chemicals will make bronchitis worse. If you smoke and you need help quitting, ask your health care provider. Quitting smoking will help your lungs heal faster.  Use an inhaler, cool mist vaporizer, or humidifier as told by your health care provider.  Keep all follow-up visits as told by your health care provider. This is important. How is this prevented? To lower your risk of getting this condition again:  Wash your hands often with soap and water. If soap and water are  not available, use hand sanitizer.  Avoid contact with people who have cold symptoms.  Try not to touch your hands to your mouth, nose, or eyes.  Make sure to get the flu shot every year.  Contact a health care provider if:  Your symptoms do not improve in 2 weeks of treatment. Get help right away if:  You cough up blood.  You have chest pain.  You have severe shortness of breath.  You  become dehydrated.  You faint or keep feeling like you are going to faint.  You keep vomiting.  You have a severe headache.  Your fever or chills gets worse. This information is not intended to replace advice given to you by your health care provider. Make sure you discuss any questions you have with your health care provider. Document Released: 06/16/2004 Document Revised: 12/02/2015 Document Reviewed: 10/28/2015 Elsevier Interactive Patient Education  2018 [employer].   Fluticasone; Vilanterol inhalation powder What is this medicine? FLUTICASONE; VILANTEROL (floo TIK a sone; vye LAN ter ol) inhalation is a combination of two medicines that decrease inflammation and help to open up the airways of your lungs. It is for chronic obstructive pulmonary disease (COPD), including chronic bronchitis or emphysema. It is also used for asthma in adults to help control symptoms. Do NOT use for an acute asthma attack or COPD attack. This medicine may be used for other purposes; ask your health care provider or pharmacist if you have questions. COMMON BRAND NAME(S): BREO ELLIPTA What should I tell my health care provider before I take this medicine? They need to know if you have any of these conditions: -bone problems -immune system problems -diabetes -heart disease or irregular heartbeat -high blood pressure -infection -pheochromocytoma -seizures -thyroid disease -an unusual or allergic reaction to fluticasone, vilanterol, milk proteins, corticosteroids, other medicines, foods, dyes, or preservatives -pregnant or trying to get pregnant -breast-feeding How should I use this medicine? This medicine is inhaled through the mouth. It is used once per day. Follow the directions on the prescription label. Do not use a spacer device with this inhaler. Take your medicine at regular intervals. Do not take your medicine more often than directed. Do not stop taking except on your doctor's advice. Make  sure that you are using your inhaler correctly. Ask you doctor or health care provider if you have any questions. A special MedGuide will be given to you by the pharmacist with each prescription and refill. Be sure to read this information carefully each time. Talk to your pediatrician regarding the use of this medicine in children. Special care may be needed. This medicine is not approved for use in children under 72 years of age. Overdosage: If you think you have taken too much of this medicine contact a poison control center or emergency room at once. NOTE: This medicine is only for you. Do not share this medicine with others. What if I miss a dose? If you miss a dose, use it as soon as you can. If it is almost time for your next dose, use only that dose and continue with your regular schedule. Do not use double or extra doses. What may interact with this medicine? Do not take this medicine with any of the following medications: -cisapride -dofetilide -dronedarone -MAOIs like Carbex, Eldepryl, Marplan, Nardil, and Parnate -pimozide -thioridazine -ziprasidone This medicine may also interact with the following medications: -antiviral medicines for HIV or AIDS -beta-blockers like metoprolol and propranolol -certain medicines for depression, anxiety, or psychotic disturbances -  certain medicines for fungal infections like ketoconazole, itraconazole, posaconazole, voriconazole -conivaptan -diuretics -medicines for colds -nefazodone -other medicines for breathing problems -other medicines that prolong the QT interval (cause an abnormal heart rhythm) This list may not describe all possible interactions. Give your health care provider a list of all the medicines, herbs, non-prescription drugs, or dietary supplements you use. Also tell them if you smoke, drink alcohol, or use illegal drugs. Some items may interact with your medicine. What should I watch for while using this medicine? Visit your  doctor or health care professional for regular checkups. Tell your doctor or health care professional if your symptoms do not get better. Do not use this medicine more than once every 24 hours. NEVER use this medicine for an acute asthma or COPD attack. You should use your short-acting rescue inhalers for this purpose. If your symptoms get worse or if you need your short-acting inhalers more often, call your doctor right away. If you are going to have surgery tell your doctor or health care professional that you are using this medicine. Try not to come in contact with people with the chicken pox or measles. If you do, call your doctor. What side effects may I notice from receiving this medicine? Side effects that you should report to your doctor or health care professional as soon as possible: -allergic reactions like skin rash or hives, swelling of the face, lips, or tongue -breathing problems right after inhaling your medicine -changes in vision -chest pain -fast, irregular heartbeat -feeling faint or lightheaded, falls -fever or chills -nausea, vomiting -tiredness Side effects that usually do not require medical attention (report to your doctor or health care professional if they continue or are bothersome): -cough -headache -nervousness -sore throat -tremor This list may not describe all possible side effects. Call your doctor for medical advice about side effects. You may report side effects to FDA at 1-800-FDA-1088. Where should I keep my medicine? Keep out of the reach of children. Store at room temperature between 15 and 30 degrees C (59 and 86 degrees F). Store in a dry place away from direct heat or sunlight. Throw away 6 weeks after you remove the inhaler from the foil tray, or after the dose indicator reads 0, whichever comes first. Throw away any unopened packages after the expiration date. NOTE: This sheet is a summary. It may not cover all possible information. If you have  questions about this medicine, talk to your doctor, pharmacist, or health care provider.  2018 Elsevier/Gold Standard (2016-05-12 15:54:39)

## 2018-01-03 ENCOUNTER — Ambulatory Visit (HOSPITAL_COMMUNITY)
Admission: RE | Admit: 2018-01-03 | Discharge: 2018-01-03 | Disposition: A | Discharge status: Home / Self Care | Payer: Medicare Other | Source: Ambulatory Visit | Attending: Adult Health | Admitting: Adult Health

## 2018-01-03 DIAGNOSIS — J984 Other disorders of lung: Secondary | ICD-10-CM | POA: Diagnosis not present

## 2018-01-03 DIAGNOSIS — F17200 Nicotine dependence, unspecified, uncomplicated: Secondary | ICD-10-CM | POA: Insufficient documentation

## 2018-01-03 DIAGNOSIS — R05 Cough: Secondary | ICD-10-CM | POA: Diagnosis not present

## 2018-01-03 DIAGNOSIS — I10 Essential (primary) hypertension: Secondary | ICD-10-CM | POA: Diagnosis not present

## 2018-01-03 LAB — LIPID PANEL
Cholesterol: 155 mg/dL (ref <200)
HDL: 56 mg/dL (ref >40)
LDL Cholesterol (Calc): 80 mg/dL (calc)
Non-HDL Cholesterol (Calc): 99 mg/dL (calc) (ref <130)
TRIGLYCERIDES: 103 mg/dL (ref <150)
Total CHOL/HDL Ratio: 2.8 (calc) (ref <5.0)

## 2018-01-03 LAB — CBC WITH DIFFERENTIAL/PLATELET
BASOS PCT: 0.7 %
Basophils Absolute: 57 cells/uL (ref 0–200)
EOS PCT: 14.1 %
Eosinophils Absolute: 1142 cells/uL — ABNORMAL HIGH (ref 15–500)
HEMATOCRIT: 44.4 % (ref 38.5–50.0)
HEMOGLOBIN: 15.5 g/dL (ref 13.2–17.1)
LYMPHS ABS: 1766 {cells}/uL (ref 850–3900)
MCH: 31.8 pg (ref 27.0–33.0)
MCHC: 34.9 g/dL (ref 32.0–36.0)
MCV: 91.2 fL (ref 80.0–100.0)
MPV: 10.1 fL (ref 7.5–12.5)
Monocytes Relative: 6.8 %
NEUTROS ABS: 4585 {cells}/uL (ref 1500–7800)
NEUTROS PCT: 56.6 %
Platelets: 250 10*3/uL (ref 140–400)
RBC: 4.87 10*6/uL (ref 4.20–5.80)
RDW: 12.7 % (ref 11.0–15.0)
Total Lymphocyte: 21.8 %
WBC: 8.1 10*3/uL (ref 3.8–10.8)
WBCMIX: 551 {cells}/uL (ref 200–950)

## 2018-01-03 LAB — VITAMIN D 25 HYDROXY (VIT D DEFICIENCY, FRACTURES): Vit D, 25-Hydroxy: 47 ng/mL (ref 30–100)

## 2018-01-03 LAB — COMPLETE METABOLIC PANEL WITH GFR
AG Ratio: 1.5 (calc) (ref 1.0–2.5)
ALKALINE PHOSPHATASE (APISO): 71 U/L (ref 40–115)
ALT: 25 U/L (ref 9–46)
AST: 29 U/L (ref 10–35)
Albumin: 4.2 g/dL (ref 3.6–5.1)
BILIRUBIN TOTAL: 0.3 mg/dL (ref 0.2–1.2)
BUN: 9 mg/dL (ref 7–25)
CHLORIDE: 104 mmol/L (ref 98–110)
CO2: 25 mmol/L (ref 20–32)
Calcium: 9.6 mg/dL (ref 8.6–10.3)
Creat: 0.8 mg/dL (ref 0.70–1.33)
GFR, Est African American: 116 mL/min/{1.73_m2} (ref >=60)
GFR, Est Non African American: 100 mL/min/{1.73_m2} (ref >=60)
GLUCOSE: 100 mg/dL — AB (ref 65–99)
Globulin: 2.8 g/dL (calc) (ref 1.9–3.7)
Potassium: 4.7 mmol/L (ref 3.5–5.3)
Sodium: 138 mmol/L (ref 135–146)
Total Protein: 7 g/dL (ref 6.1–8.1)

## 2018-01-03 LAB — TSH: TSH: 1.06 m[IU]/L (ref 0.40–4.50)

## 2018-01-03 LAB — MAGNESIUM: MAGNESIUM: 2.1 mg/dL (ref 1.5–2.5)

## 2018-01-25 ENCOUNTER — Other Ambulatory Visit: Payer: Self-pay | Admitting: Internal Medicine

## 2018-01-25 DIAGNOSIS — F3170 Bipolar disorder, currently in remission, most recent episode unspecified: Secondary | ICD-10-CM

## 2018-01-25 MED ORDER — ALPRAZOLAM 1 MG PO TABS: ORAL_TABLET | ORAL | 0 refills | Status: DC

## 2018-02-01 ENCOUNTER — Encounter: Payer: Self-pay | Admitting: Podiatry

## 2018-02-01 ENCOUNTER — Ambulatory Visit (INDEPENDENT_AMBULATORY_CARE_PROVIDER_SITE_OTHER): Payer: Medicare Other | Admitting: Podiatry

## 2018-02-01 ENCOUNTER — Ambulatory Visit (INDEPENDENT_AMBULATORY_CARE_PROVIDER_SITE_OTHER): Payer: Medicare Other

## 2018-02-01 ENCOUNTER — Other Ambulatory Visit: Payer: Self-pay | Admitting: Podiatry

## 2018-02-01 VITALS — BP 135/93 | HR 84 | Resp 16

## 2018-02-01 DIAGNOSIS — M722 Plantar fascial fibromatosis: Secondary | ICD-10-CM

## 2018-02-01 DIAGNOSIS — M79672 Pain in left foot: Principal | ICD-10-CM

## 2018-02-01 DIAGNOSIS — M79671 Pain in right foot: Secondary | ICD-10-CM

## 2018-02-01 MED ORDER — TRIAMCINOLONE ACETONIDE 10 MG/ML IJ SUSP
10.0000 mg | Freq: Once | INTRAMUSCULAR | Status: AC
  Administered 2018-02-01: 10 mg

## 2018-02-01 NOTE — Progress Notes (Signed)
Subjective:   Patient ID: Howard Fox, male   DOB: 57 y.o.   MRN: [id number]   HPI Patient presents with a lot of discomfort in the plantar aspect of the right heel with mild pain left.  States is been going on for around 4 months and is worse when he gets up in the morning and after periods of sitting and he does not remember any kind of beginning stage to this.  Patient smokes three-quarter pack per day and would like to be active   Review of Systems  All other systems reviewed and are negative.       Objective:  Physical Exam  Constitutional: He appears well-developed and well-nourished.  Cardiovascular: Intact distal pulses.  Pulmonary/Chest: Effort normal.  Musculoskeletal: Normal range of motion.  Neurological: He is alert.  Skin: Skin is warm.  Nursing note and vitals reviewed.   Neurovascular status found to be intact muscle strength is adequate range of motion within normal limits with patient found to have exquisite discomfort plantar aspect heel right with mild discomfort on the left.  Patient was noted to have good digital perfusion well oriented x3 with mild depression of the arch     Assessment:  Acute plantar fasciitis right with mild discomfort on the left plantar fascia     Plan:  H&P x-rays reviewed today were to focus on the right heel.  I injected the right plantar fascia 3 mg Kenalog 5 mg Xylocaine and applied fascial brace right and gave instructions for physical therapy and shoe gear modifications.  Reappoint for [place] to recheck again in the next 3 weeks  X-ray indicates that there is spur formation with no indications of stress fracture arthritis bilateral

## 2018-02-01 NOTE — Progress Notes (Signed)
   Subjective:    Patient ID: Howard Fox, male    DOB: 14-Aug-1960, 57 y.o.   MRN: [id number]  HPI    Review of Systems  All other systems reviewed and are negative.      Objective:   Physical Exam        Assessment & Plan:

## 2018-02-01 NOTE — Patient Instructions (Addendum)

## 2018-02-15 ENCOUNTER — Ambulatory Visit (INDEPENDENT_AMBULATORY_CARE_PROVIDER_SITE_OTHER): Payer: Medicare Other | Admitting: Podiatry

## 2018-02-15 ENCOUNTER — Encounter: Payer: Self-pay | Admitting: Podiatry

## 2018-02-15 DIAGNOSIS — M722 Plantar fascial fibromatosis: Secondary | ICD-10-CM

## 2018-02-15 NOTE — Progress Notes (Signed)
Subjective:   Patient ID: Howard Fox, male   DOB: 57 y.o.   MRN: [id number]   HPI Patient states he is significantly improved with his heel with minimal discomfort and able to walk distances without pain currently   ROS      Objective:  Physical Exam  Neurovascular status intact muscle strength is adequate with significant diminishment of discomfort of the plantar heel right with pain still only present upon deep palpation     Assessment:  Significant improvement plantar fasciitis right     Plan:  Reviewed continued physical therapy anti-inflammatory supportive shoe gear and reappoint for [place] to recheck again in the next 4 weeks or earlier if needed

## 2018-02-25 ENCOUNTER — Other Ambulatory Visit: Payer: Self-pay | Admitting: Internal Medicine

## 2018-02-25 DIAGNOSIS — F3170 Bipolar disorder, currently in remission, most recent episode unspecified: Secondary | ICD-10-CM

## 2018-02-25 MED ORDER — ALPRAZOLAM 1 MG PO TABS: ORAL_TABLET | ORAL | 0 refills | Status: DC

## 2018-03-27 ENCOUNTER — Other Ambulatory Visit: Payer: Self-pay | Admitting: Internal Medicine

## 2018-03-27 DIAGNOSIS — F3170 Bipolar disorder, currently in remission, most recent episode unspecified: Secondary | ICD-10-CM

## 2018-04-02 DIAGNOSIS — M545 Low back pain: Secondary | ICD-10-CM | POA: Diagnosis not present

## 2018-04-04 ENCOUNTER — Ambulatory Visit: Payer: Self-pay | Admitting: Internal Medicine
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
## 2018-04-23 ENCOUNTER — Other Ambulatory Visit: Payer: Self-pay | Admitting: Internal Medicine

## 2018-04-26 ENCOUNTER — Other Ambulatory Visit: Payer: Self-pay | Admitting: Internal Medicine

## 2018-04-26 DIAGNOSIS — F3170 Bipolar disorder, currently in remission, most recent episode unspecified: Secondary | ICD-10-CM

## 2018-04-27 DIAGNOSIS — S76312A Strain of muscle, fascia and tendon of the posterior muscle group at thigh level, left thigh, initial encounter: Secondary | ICD-10-CM | POA: Diagnosis not present

## 2018-05-02 ENCOUNTER — Other Ambulatory Visit: Payer: Self-pay | Admitting: Internal Medicine

## 2018-05-02 ENCOUNTER — Other Ambulatory Visit: Payer: Self-pay | Admitting: *Deleted

## 2018-05-02 MED ORDER — CITALOPRAM HYDROBROMIDE 40 MG PO TABS: 40.0000 mg | ORAL_TABLET | Freq: Every day | ORAL | 1 refills | Status: DC

## 2018-05-03 ENCOUNTER — Ambulatory Visit: Payer: Self-pay | Admitting: Internal Medicine

## 2018-05-27 ENCOUNTER — Other Ambulatory Visit: Payer: Self-pay | Admitting: Adult Health

## 2018-05-27 ENCOUNTER — Encounter: Payer: Self-pay | Admitting: Internal Medicine

## 2018-05-27 DIAGNOSIS — F3170 Bipolar disorder, currently in remission, most recent episode unspecified: Secondary | ICD-10-CM
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
## 2018-05-28 ENCOUNTER — Ambulatory Visit: Payer: Self-pay | Admitting: Internal Medicine

## 2018-05-31 DIAGNOSIS — M545 Low back pain: Secondary | ICD-10-CM | POA: Diagnosis not present

## 2018-06-26 ENCOUNTER — Ambulatory Visit (INDEPENDENT_AMBULATORY_CARE_PROVIDER_SITE_OTHER): Payer: Medicare Other | Admitting: Internal Medicine

## 2018-06-26 ENCOUNTER — Other Ambulatory Visit: Payer: Self-pay | Admitting: Internal Medicine

## 2018-06-26 ENCOUNTER — Encounter: Payer: Self-pay | Admitting: Internal Medicine

## 2018-06-26 VITALS — BP 120/90 | HR 85 | Temp 97.7°F | Ht 71.5 in | Wt 235.8 lb

## 2018-06-26 DIAGNOSIS — R7309 Other abnormal glucose: Secondary | ICD-10-CM

## 2018-06-26 DIAGNOSIS — E559 Vitamin D deficiency, unspecified: Secondary | ICD-10-CM | POA: Diagnosis not present

## 2018-06-26 DIAGNOSIS — I10 Essential (primary) hypertension: Secondary | ICD-10-CM

## 2018-06-26 DIAGNOSIS — Z79899 Other long term (current) drug therapy: Secondary | ICD-10-CM | POA: Diagnosis not present

## 2018-06-26 DIAGNOSIS — E782 Mixed hyperlipidemia: Secondary | ICD-10-CM

## 2018-06-26 DIAGNOSIS — F3170 Bipolar disorder, currently in remission, most recent episode unspecified: Secondary | ICD-10-CM | POA: Diagnosis not present

## 2018-06-26 DIAGNOSIS — R7303 Prediabetes: Secondary | ICD-10-CM | POA: Insufficient documentation

## 2018-06-26 DIAGNOSIS — Z87898 Personal history of other specified conditions: Secondary | ICD-10-CM | POA: Insufficient documentation

## 2018-06-26 MED ORDER — ALPRAZOLAM 1 MG PO TABS: ORAL_TABLET | ORAL | 0 refills | Status: DC

## 2018-06-26 MED ORDER — AMITRIPTYLINE HCL 150 MG PO TABS: ORAL_TABLET | ORAL | 1 refills | Status: DC

## 2018-06-26 NOTE — Patient Instructions (Signed)

## 2018-06-26 NOTE — Progress Notes (Signed)
This very nice 58 y.o.male presents for 3 month follow up with HTN, HLD, Pre-Diabetes and Vitamin D Deficiency.      Patient is treated for HTN & BP has been controlled at home. Today's BP is borderline, high normal - 120/90. Patient has had no complaints of any cardiac type chest pain, palpitations, dyspnea / orthopnea / PND, dizziness, claudication, or dependent edema.     Hyperlipidemia is controlled with diet & meds. Patient denies myalgias or other med SE's. Last Lipids were at goal: Lab Results  Component Value Date   CHOL 155 01/02/2018   HDL 56 01/02/2018   LDLCALC 80 01/02/2018   TRIG 103 01/02/2018   CHOLHDL 2.8 01/02/2018      Also, the patient has history of T2_NIDDM PreDiabetes and has had no symptoms of reactive hypoglycemia, diabetic polys, paresthesias or visual blurring.  Last A1c was Normal & at goal: Lab Results  Component Value Date   HGBA1C 5.3 08/31/2017      Further, the patient also has history of Vitamin D Deficiency and supplements vitamin D without any suspected side-effects. Last vitamin D was low:  Lab Results  Component Value Date   VD25OH 47 01/02/2018   Current Outpatient Medications on File Prior to Visit  Medication Sig  . albuterol (PROVENTIL HFA;VENTOLIN HFA) 108 (90 Base) MCG/ACT inhaler INHALE 2 PUFFS BY MOUTH EVERY 4 HOURS AS NEEDED FOR WHEEZING AND FOR SHORTNESS OF BREATH  . ALPRAZolam (XANAX) 1 MG tablet Take 1/2-1 tablet 2 - 3 x /day ONLY if needed for Anxiety Attack &  limit to 5 days /week to avoid addiction  . amitriptyline (ELAVIL) 25 MG tablet TAKE ONE TABLET BY MOUTH 4 TIMES DAILY FOR MOOD  . aspirin EC 325 MG tablet Take 325 mg by mouth daily.   . carisoprodol (SOMA) 350 MG tablet Take 350 mg by mouth as needed.   . Cholecalciferol (VITAMIN D3) 10000 units capsule Take 10,000 Units by mouth daily.  . citalopram (CELEXA) 40 MG tablet Take 1 tablet (40 mg total) by mouth daily.  . fluticasone furoate-vilanterol (BREO ELLIPTA)  100-25 MCG/INH AEPB Inhale 1 puff into the lungs daily. Rinse mouth with water after each use  . ibuprofen (ADVIL,MOTRIN) 200 MG tablet Take 800 mg by mouth every 8 (eight) hours as needed for moderate pain.   Marland Kitchen lisinopril-hydrochlorothiazide (PRINZIDE,ZESTORETIC) 20-25 MG tablet TAKE ONE TABLET BY MOUTH ONCE DAILY  . rosuvastatin (CRESTOR) 40 MG tablet Take 1/2 to 1 tablet daily or as directed for Cholesterol   No current facility-administered medications on file prior to visit.    Allergies  Allergen Reactions  . Codeine Itching   PMHx:   Past Medical History:  Diagnosis Date  . Bipolar 1 disorder ([place])   . Hepatitis C   . Hypertension   . Kidney stones    Immunization History  Administered Date(s) Administered  . DT 04/01/2015  . Td 05/23/2004   Past Surgical History:  Procedure Laterality Date  . CHOLECYSTECTOMY     FHx:    Reviewed / unchanged  SHx:    Reviewed / unchanged   Systems Review:  Constitutional: Denies fever, chills, wt changes, headaches, insomnia, fatigue, night sweats, change in appetite. Eyes: Denies redness, blurred vision, diplopia, discharge, itchy, watery eyes.  ENT: Denies discharge, congestion, post nasal drip, epistaxis, sore throat, earache, hearing loss, dental pain, tinnitus, vertigo, sinus pain, snoring.  CV: Denies chest pain, palpitations, irregular heartbeat, syncope, dyspnea, diaphoresis, orthopnea, PND,  claudication or edema. Respiratory: denies cough, dyspnea, DOE, pleurisy, hoarseness, laryngitis, wheezing.  Gastrointestinal: Denies dysphagia, odynophagia, heartburn, reflux, water brash, abdominal pain or cramps, nausea, vomiting, bloating, diarrhea, constipation, hematemesis, melena, hematochezia  or hemorrhoids. Genitourinary: Denies dysuria, frequency, urgency, nocturia, hesitancy, discharge, hematuria or flank pain. Musculoskeletal: Denies arthralgias, myalgias, stiffness, jt. swelling, pain, limping or strain/sprain.  Skin: Denies  pruritus, rash, hives, warts, acne, eczema or change in skin lesion(s). Neuro: No weakness, tremor, incoordination, spasms, paresthesia or pain. Psychiatric: Denies confusion, memory loss or sensory loss. Endo: Denies change in weight, skin or hair change.  Heme/Lymph: No excessive bleeding, bruising or enlarged lymph nodes.  Physical Exam  BP 120/90   Pulse 85   Temp 97.7 F (36.5 C)   Ht 5' 11.5" (1.816 m)   Wt 235 lb 12.8 oz (107 kg)   SpO2 97%   BMI 32.43 kg/m   Appears  well nourished, well groomed  and in no distress.  Eyes: PERRLA, EOMs, conjunctiva no swelling or erythema. Sinuses: No frontal/maxillary tenderness ENT/Mouth: EAC's clear, TM's nl w/o erythema, bulging. Nares clear w/o erythema, swelling, exudates. Oropharynx clear without erythema or exudates. Oral hygiene is good. Tongue normal, non obstructing. Hearing intact.  Neck: Supple. Thyroid not palpable. Car 2+/2+ without bruits, nodes or JVD. Chest: Respirations nl with BS clear & equal w/o rales, rhonchi, wheezing or stridor.  Cor: Heart sounds normal w/ regular rate and rhythm without sig. murmurs, gallops, clicks or rubs. Peripheral pulses normal and equal  without edema.  Abdomen: Soft & bowel sounds normal. Non-tender w/o guarding, rebound, hernias, masses or organomegaly.  Lymphatics: Unremarkable.  Musculoskeletal: Full ROM all peripheral extremities, joint stability, 5/5 strength and normal gait.  Skin: Warm, dry without exposed rashes, lesions or ecchymosis apparent.  Neuro: Cranial nerves intact, reflexes equal bilaterally. Sensory-motor testing grossly intact. Tendon reflexes grossly intact.  Pysch: Alert & oriented x 3.  Insight and judgement nl & appropriate. No ideations.  Assessment and Plan:  1. Essential hypertension  - Continue medication, monitor blood pressure at home.  - Continue DASH diet.  Reminder to go to the ER if any CP,  SOB, nausea, dizziness, severe HA, changes vision/speech.  -  CBC with Differential/Platelet - COMPLETE METABOLIC PANEL WITH GFR - Magnesium - TSH  2. Hyperlipidemia, mixed  - Continue diet/meds, exercise,& lifestyle modifications.  - Continue monitor periodic cholesterol/liver & renal functions   - Lipid panel - TSH  3. Abnormal glucose  - Continue diet, exercise  - Lifestyle modifications.  - Monitor appropriate labs.  - Hemoglobin A1c - Insulin, random  4. Vitamin D deficiency  - Continue supplementation.  - VITAMIN D 25 Hydroxyl  5. Prediabetes  - Hemoglobin A1c - Insulin, random  6. Bipolar disorder in partial remission, most recent episode unspecified type ([place])  - ALPRAZolam (XANAX) 1 MG tablet; Take 1/2-1 tablet 2 - 3 x /day ONLY if needed for Anxiety Attack &  limit to 5 days /week to avoid addiction  Dispense: 30 tablet  7. Medication management  - CBC with Differential/Platelet - COMPLETE METABOLIC PANEL WITH GFR - Magnesium - Lipid panel - TSH - Hemoglobin A1c - Insulin, random - VITAMIN D 25 Hydroxyl       Discussed  regular exercise, BP monitoring, weight control to achieve/maintain BMI less than 25 and discussed med and SE's. Recommended labs to assess and monitor clinical status with further disposition pending results of labs. Over 30 minutes of exam, counseling, chart review was performed.

## 2018-06-27 LAB — CBC WITH DIFFERENTIAL/PLATELET
Absolute Monocytes: 735 cells/uL (ref 200–950)
BASOS PCT: 0.6 %
Basophils Absolute: 56 cells/uL (ref 0–200)
Eosinophils Absolute: 74 cells/uL (ref 15–500)
Eosinophils Relative: 0.8 %
HCT: 41.2 % (ref 38.5–50.0)
Hemoglobin: 14.4 g/dL (ref 13.2–17.1)
Lymphs Abs: 1274 cells/uL (ref 850–3900)
MCH: 31.4 pg (ref 27.0–33.0)
MCHC: 35 g/dL (ref 32.0–36.0)
MCV: 89.8 fL (ref 80.0–100.0)
MPV: 10.1 fL (ref 7.5–12.5)
Monocytes Relative: 7.9 %
NEUTROS ABS: 7161 {cells}/uL (ref 1500–7800)
Neutrophils Relative %: 77 %
Platelets: 209 10*3/uL (ref 140–400)
RBC: 4.59 10*6/uL (ref 4.20–5.80)
RDW: 12.4 % (ref 11.0–15.0)
Total Lymphocyte: 13.7 %
WBC: 9.3 10*3/uL (ref 3.8–10.8)

## 2018-06-27 LAB — COMPLETE METABOLIC PANEL WITH GFR
AG Ratio: 1.5 (calc) (ref 1.0–2.5)
ALT: 19 U/L (ref 9–46)
AST: 20 U/L (ref 10–35)
Albumin: 4.1 g/dL (ref 3.6–5.1)
Alkaline phosphatase (APISO): 65 U/L (ref 35–144)
BUN: 11 mg/dL (ref 7–25)
CO2: 22 mmol/L (ref 20–32)
Calcium: 9.3 mg/dL (ref 8.6–10.3)
Chloride: 106 mmol/L (ref 98–110)
Creat: 0.81 mg/dL (ref 0.70–1.33)
GFR, Est African American: 114 mL/min/{1.73_m2} (ref >=60)
GFR, Est Non African American: 99 mL/min/{1.73_m2} (ref >=60)
GLUCOSE: 103 mg/dL — AB (ref 65–99)
Globulin: 2.8 g/dL (calc) (ref 1.9–3.7)
Potassium: 4 mmol/L (ref 3.5–5.3)
Sodium: 137 mmol/L (ref 135–146)
Total Bilirubin: 0.4 mg/dL (ref 0.2–1.2)
Total Protein: 6.9 g/dL (ref 6.1–8.1)

## 2018-06-27 LAB — LIPID PANEL
CHOL/HDL RATIO: 2.2 (calc) (ref <5.0)
Cholesterol: 152 mg/dL (ref <200)
HDL: 70 mg/dL (ref >=40)
LDL Cholesterol (Calc): 67 mg/dL (calc)
Non-HDL Cholesterol (Calc): 82 mg/dL (calc) (ref <130)
Triglycerides: 70 mg/dL (ref <150)

## 2018-06-27 LAB — TSH: TSH: 0.65 mIU/L (ref 0.40–4.50)

## 2018-06-27 LAB — HEMOGLOBIN A1C
Hgb A1c MFr Bld: 5.6 % of total Hgb (ref <5.7)
MEAN PLASMA GLUCOSE: 114 (calc)
eAG (mmol/L): 6.3 (calc)

## 2018-06-27 LAB — INSULIN, RANDOM: Insulin: 11.1 u[IU]/mL (ref 2.0–19.6)

## 2018-06-27 LAB — VITAMIN D 25 HYDROXY (VIT D DEFICIENCY, FRACTURES): Vit D, 25-Hydroxy: 29 ng/mL — ABNORMAL LOW (ref 30–100)

## 2018-06-27 LAB — MAGNESIUM: Magnesium: 1.9 mg/dL (ref 1.5–2.5)

## 2018-07-01 ENCOUNTER — Encounter: Payer: Self-pay | Admitting: Internal Medicine

## 2018-07-25 ENCOUNTER — Other Ambulatory Visit: Payer: Self-pay | Admitting: Internal Medicine

## 2018-07-25 DIAGNOSIS — F3170 Bipolar disorder, currently in remission, most recent episode unspecified: Secondary | ICD-10-CM

## 2018-07-25 MED ORDER — ALPRAZOLAM 1 MG PO TABS: ORAL_TABLET | ORAL | 0 refills | Status: DC

## 2018-07-30 ENCOUNTER — Other Ambulatory Visit: Payer: Self-pay | Admitting: Internal Medicine

## 2018-08-11 ENCOUNTER — Encounter (HOSPITAL_COMMUNITY): Payer: Self-pay | Admitting: *Deleted

## 2018-08-11 ENCOUNTER — Emergency Department (HOSPITAL_COMMUNITY)
Admission: EM | Admit: 2018-08-11 | Discharge: 2018-08-11 | Disposition: A | Discharge status: Home / Self Care | Payer: Medicare Other | Attending: Emergency Medicine | Admitting: Emergency Medicine

## 2018-08-11 ENCOUNTER — Emergency Department (HOSPITAL_COMMUNITY): Payer: Medicare Other

## 2018-08-11 ENCOUNTER — Other Ambulatory Visit: Payer: Self-pay

## 2018-08-11 DIAGNOSIS — Z79899 Other long term (current) drug therapy: Secondary | ICD-10-CM | POA: Insufficient documentation

## 2018-08-11 DIAGNOSIS — F17210 Nicotine dependence, cigarettes, uncomplicated: Secondary | ICD-10-CM | POA: Insufficient documentation

## 2018-08-11 DIAGNOSIS — Z7982 Long term (current) use of aspirin: Secondary | ICD-10-CM | POA: Diagnosis not present

## 2018-08-11 DIAGNOSIS — I10 Essential (primary) hypertension: Secondary | ICD-10-CM | POA: Diagnosis not present

## 2018-08-11 DIAGNOSIS — R0789 Other chest pain: Secondary | ICD-10-CM | POA: Diagnosis not present

## 2018-08-11 DIAGNOSIS — R0902 Hypoxemia: Secondary | ICD-10-CM | POA: Diagnosis not present

## 2018-08-11 DIAGNOSIS — R918 Other nonspecific abnormal finding of lung field: Secondary | ICD-10-CM | POA: Diagnosis not present

## 2018-08-11 MED ORDER — DOXYCYCLINE HYCLATE 100 MG PO CAPS: 100.0000 mg | ORAL_CAPSULE | Freq: Two times a day (BID) | ORAL | 0 refills | Status: DC

## 2018-08-11 MED ORDER — HYDROCODONE-ACETAMINOPHEN 5-325 MG PO TABS
1.0000 | ORAL_TABLET | Freq: Once | ORAL | Status: AC
  Administered 2018-08-11: 1 via ORAL
  Filled 2018-08-11: qty 1

## 2018-08-11 MED ORDER — HYDROCODONE-ACETAMINOPHEN 5-325 MG PO TABS: 1.0000 | ORAL_TABLET | Freq: Four times a day (QID) | ORAL | 0 refills | Status: DC | PRN

## 2018-08-11 MED ORDER — DOXYCYCLINE HYCLATE 100 MG PO TABS
100.0000 mg | ORAL_TABLET | Freq: Once | ORAL | Status: AC
  Administered 2018-08-11: 100 mg via ORAL
  Filled 2018-08-11: qty 1

## 2018-08-11 NOTE — ED Triage Notes (Signed)
Per [facility], pt had a fall 15 years ago and had his rib cage wired.  Took his muscle relaxer and has not been helping.

## 2018-08-11 NOTE — ED Provider Notes (Signed)
[facility] DEPT Provider Note: Georgena Spurling, MD, FACEP  CSN: [id number] MRN: [id number] ARRIVAL: 08/11/18 at [place]: [place]  rib cage spasms   HISTORY OF PRESENT ILLNESS  08/11/18 3:57 AM Howard Fox is a 58 y.o. male with a history of major chest trauma about 15 years ago requiring surgical intervention.  He was working in the yard 2 days ago.  He denies any specific injury but yesterday developed pain in his lower ribs which he describes as spasms.  He rates the pain as severe and not relieved with ibuprofen and Soma.  He is not short of breath but it hurts to take deep breaths.   Past Medical History:  Diagnosis Date   Bipolar 1 disorder ([place])    Hepatitis C    Hypertension    Kidney stones     Past Surgical History:  Procedure Laterality Date   CHOLECYSTECTOMY      Family History  Problem Relation Age of Onset   Mental illness Mother        Bipolar   Stroke Father    Arthritis Father     Social History   Tobacco Use   Smoking status: Current Every Day Smoker    Packs/day: 0.75    Years: 41.00    Pack years: 30.75    Types: Cigarettes    Start date: 01/02/1977   Smokeless tobacco: Never Used  Substance Use Topics   Alcohol use: Yes    Alcohol/week: 2.0 standard drinks    Types: 2 Cans of beer per week   Drug use: No    Prior to Admission medications   Medication Sig Start Date End Date Taking? Authorizing Provider  ALPRAZolam (XANAX) 1 MG tablet Take 1/2-1 tablet 2 - 3 x /day ONLY if needed for Anxiety Attack &  limit to 5 days /week to avoid addiction Patient taking differently: Take 1 mg by mouth daily.  07/25/18  Yes Unk Pinto, MD  amitriptyline (ELAVIL) 150 MG tablet Take 1 tablet at Bedtime for Sleep & Mood Patient taking differently: Take 150 mg by mouth at bedtime.  06/26/18  Yes Unk Pinto, MD  aspirin EC 325 MG tablet Take 325 mg by mouth daily.    Yes [provider]  carisoprodol  (SOMA) 350 MG tablet Take 350 mg by mouth every 6 (six) hours as needed for muscle spasms.    Yes [provider]  Cholecalciferol (VITAMIN D) 50 MCG (2000 UT) tablet Take 6,000 Units by mouth daily.   Yes [provider]  citalopram (CELEXA) 40 MG tablet Take 1 tablet (40 mg total) by mouth daily. 05/02/18  Yes Unk Pinto, MD  Cyanocobalamin (VITAMIN B-12) 1000 MCG SUBL Place 1,000 mcg under the tongue every other day.    Yes [provider]  ibuprofen (ADVIL,MOTRIN) 200 MG tablet Take 800 mg by mouth every 8 (eight) hours as needed for moderate pain.    Yes [provider]  lisinopril-hydrochlorothiazide (PRINZIDE,ZESTORETIC) 20-25 MG tablet TAKE ONE TABLET BY MOUTH ONCE DAILY Patient taking differently: Take 0.5 tablets by mouth daily.  04/26/17  Yes Unk Pinto, MD  rosuvastatin (CRESTOR) 40 MG tablet Take 1/2 to 1 tablet daily or as directed for Cholesterol Patient taking differently: Take 40 mg by mouth daily.  09/01/17  Yes Unk Pinto, MD  albuterol (PROVENTIL HFA;VENTOLIN HFA) 108 (90 Base) MCG/ACT inhaler INHALE 2 PUFFS BY MOUTH EVERY 4 HOURS AS NEEDED FOR WHEEZING AND FOR SHORTNESS OF  BREATH Patient taking differently: Inhale 2 puffs into the lungs every 4 (four) hours as needed for wheezing.  08/21/17   Unk Pinto, MD  fluticasone furoate-vilanterol (BREO ELLIPTA) 100-25 MCG/INH AEPB Inhale 1 puff into the lungs daily. Rinse mouth with water after each use Patient not taking: Reported on 08/11/2018 01/02/18   Liane Comber, NP    Allergies Codeine   REVIEW OF SYSTEMS  Negative except as noted here or in the History of Present Illness.   PHYSICAL EXAMINATION  Initial Vital Signs Blood pressure (!) 153/99, pulse 76, temperature 99.4 F (37.4 C), temperature source Oral, resp. rate 18, height 6\' 1"  (1.854 m), weight 106.1 kg, SpO2 98 %.  Examination General: Well-developed, well-nourished male in no acute distress; appearance  consistent with age of record HENT: normocephalic; atraumatic Eyes: pupils equal, round and reactive to light; extraocular muscles intact Neck: supple Heart: regular rate and rhythm Lungs: clear to auscultation bilaterally Chest: Lower rib tenderness bilaterally Abdomen: soft; nondistended; nontender; bowel sounds present Extremities: No deformity; full range of motion; pulses normal Neurologic: Awake, alert and oriented; motor function intact in all extremities and symmetric; no facial droop Skin: Warm and dry Psychiatric: Normal mood and affect   RESULTS  Summary of this visit's results, reviewed by myself:   EKG Interpretation  Date/Time:    Ventricular Rate:    PR Interval:    QRS Duration:   QT Interval:    QTC Calculation:   R Axis:     Text Interpretation:        Laboratory Studies: No results found for this or any previous visit (from the past 24 hour(s)). Imaging Studies: Dg Ribs Unilateral W/chest Left  Result Date: 08/11/2018 CLINICAL DATA:  58 year old male with history of left-sided rib pain for the past 2 days. EXAM: LEFT RIBS AND CHEST - 3+ VIEW COMPARISON:  Chest x-ray 01/03/2018. FINDINGS: Again noted are extensive postoperative changes in the left hemithorax related to prior partial chest wall resection, with multiple broken cerclage wires associated with several left-sided ribs. Much of the left eighth through twelfth ribs have been resected. Dedicated views of the left ribs demonstrate no definite new acute displaced fracture. No pneumothorax. Ill-defined hazy opacity in the left mid lung, concerning for developing area of consolidation. Right lung appears clear. Mild diffuse peribronchial cuffing which appears to be chronic and similar to the prior study. No pleural effusions. No evidence of pulmonary edema. Heart size is normal. Upper mediastinal contours are within normal limits. IMPRESSION: 1. New ill-defined opacity in the left mid lung concerning for  developing airspace consolidation. This may reflect a early pneumonia. Followup PA and lateral chest X-ray is recommended in 3-4 weeks following trial of antibiotic therapy to ensure resolution and exclude underlying malignancy. 2. Chronic peribronchial cuffing likely reflective of chronic bronchitis. 3. Status post left chest wall resection with stable appearance and no definite acute abnormality of the left ribs. Electronically Signed   By: Vinnie Langton M.D.   On: 08/11/2018 05:08    ED COURSE and MDM  Nursing notes and initial vitals signs, including pulse oximetry, reviewed.  Vitals:   08/11/18 0159 08/11/18 0421 08/11/18 0453 08/11/18 0457  BP: (!) 153/99 (!) 167/105 (!) 181/98 (!) 161/94  Pulse: 76 77 76   Resp: 18 16 16    Temp: 99.4 F (37.4 C)     TempSrc: Oral     SpO2: 98% 99% 98%   Weight: 106.1 kg     Height: 6\' 1"  (1.854  m)      Will place on antibiotics for possible pneumonia given low-grade fever on arrival and x-ray findings.  PROCEDURES    ED DIAGNOSES     ICD-10-CM   1. Chest wall pain R07.89        Shanon Rosser, MD 08/11/18 [phone]

## 2018-08-24 ENCOUNTER — Other Ambulatory Visit: Payer: Self-pay | Admitting: Internal Medicine

## 2018-08-24 DIAGNOSIS — F3170 Bipolar disorder, currently in remission, most recent episode unspecified: Secondary | ICD-10-CM

## 2018-08-24 MED ORDER — ALPRAZOLAM 1 MG PO TABS: ORAL_TABLET | ORAL | 0 refills | Status: DC

## 2018-08-30 ENCOUNTER — Telehealth: Payer: Self-pay

## 2018-08-30 ENCOUNTER — Ambulatory Visit
Admission: RE | Admit: 2018-08-30 | Discharge: 2018-08-30 | Disposition: A | Discharge status: Home / Self Care | Payer: Medicare Other | Source: Ambulatory Visit | Attending: Adult Health | Admitting: Adult Health

## 2018-08-30 ENCOUNTER — Encounter: Payer: Self-pay | Admitting: Adult Health

## 2018-08-30 ENCOUNTER — Ambulatory Visit: Payer: Medicare Other | Admitting: Adult Health

## 2018-08-30 ENCOUNTER — Other Ambulatory Visit: Payer: Self-pay | Admitting: Adult Health

## 2018-08-30 ENCOUNTER — Other Ambulatory Visit: Payer: Self-pay

## 2018-08-30 VITALS — BP 135/90 | HR 74 | Temp 97.5°F | Wt 232.0 lb

## 2018-08-30 DIAGNOSIS — R0789 Other chest pain: Secondary | ICD-10-CM

## 2018-08-30 DIAGNOSIS — R9389 Abnormal findings on diagnostic imaging of other specified body structures: Secondary | ICD-10-CM

## 2018-08-30 DIAGNOSIS — J189 Pneumonia, unspecified organism: Secondary | ICD-10-CM | POA: Diagnosis not present

## 2018-08-30 DIAGNOSIS — F17200 Nicotine dependence, unspecified, uncomplicated: Secondary | ICD-10-CM | POA: Diagnosis not present

## 2018-08-30 NOTE — Telephone Encounter (Signed)
Howard Fox states that he was seen 2.5 weeks ago at [facility] and was diagnosed with a slight case of Pneumonia. Still having pain under the ribs on the right side. No shortness of breath or having any trouble breathing at all. Questioning if he should have an xray done or not. Please advise.

## 2018-08-30 NOTE — Progress Notes (Signed)
Virtual Visit via Telephone Note  I connected with Howard Fox on 08/30/18 at  1:00 PM EDT by telephone and verified that I am speaking with the correct person using two identifiers.   I discussed the limitations, risks, security and privacy concerns of performing an evaluation and management service by telephone and the availability of in person appointments. I also discussed with the patient that there may be a patient responsible charge related to this service. The patient expressed understanding and agreed to proceed.   History of Present Illness:  BP 135/90   Pulse 74   Temp (!) 97.5 F (36.4 C)   Wt 232 lb (105.2 kg)   BMI 30.61 kg/m   58 y.o. male with hx of htn, bipolar disorder, tobacco use (1/2 pack currently, has been smoking 42 years, average 0.75 pack), on disability due to hx of traumatic injury, was seen in ED on 08/11/2018 for chest wall pain x 2 days after working in his yard a few days prior; workup was unremarkable excepting CXR which showed:  Result Date: 08/11/2018 CLINICAL DATA:  58 year old male with history of left-sided rib pain for the past 2 days. EXAM: LEFT RIBS AND CHEST - 3+ VIEW COMPARISON:  Chest x-ray 01/03/2018. FINDINGS: Again noted are extensive postoperative changes in the left hemithorax related to prior partial chest wall resection, with multiple broken cerclage wires associated with several left-sided ribs. Much of the left eighth through twelfth ribs have been resected. Dedicated views of the left ribs demonstrate no definite new acute displaced fracture. No pneumothorax. Ill-defined hazy opacity in the left mid lung, concerning for developing area of consolidation. Right lung appears clear. Mild diffuse peribronchial cuffing which appears to be chronic and similar to the prior study. No pleural effusions. No evidence of pulmonary edema. Heart size is normal. Upper mediastinal contours are within normal limits. IMPRESSION: 1. New ill-defined opacity in  the left mid lung concerning for developing airspace consolidation. This may reflect a early pneumonia. Followup PA and lateral chest X-ray is recommended in 3-4 weeks following trial of antibiotic therapy to ensure resolution and exclude underlying malignancy. 2. Chronic peribronchial cuffing likely reflective of chronic bronchitis. 3. Status post left chest wall resection with stable appearance and no definite acute abnormality of the left ribs.  He did complete course of doxycycline 100 mg caps BID x 7 days. Possible mild imrpovement in pain but he is unsure. Denies fever/chills, fatigue, cough, wheezing, dyspnea.  He reports persistent ongoing chest pain. He denies unintentional weight loss.  L side is worse but has bilaterally, 4-5/10 at rest, up to 8-9/10 with any attempts to be up and moving about. He was prescribed vicodin which resolved pain fully but ran out and has been taking 1200 mg ibuprofen every 4-6 hours (advised to reduce to max 800 mg TID-QID). He does report he has had chronic pain related to history of traumatic injury, typically in back and ribs.   Tylenol not helpful.   Hx of chest trauma and surgical correction.  BMI is Body mass index is 30.61 kg/m. Wt Readings from Last 3 Encounters:  08/30/18 232 lb (105.2 kg)  08/11/18 234 lb (106.1 kg)  06/26/18 235 lb 12.8 oz (107 kg)      Observations/Objective:  General : Well sounding patient in no apparent distress HEENT: no hoarseness, no cough for duration of visit Lungs: speaks in complete sentences, no audible wheezing, no apparent distress Neurological: alert, oriented x 3 Psychiatric: pleasant, judgement appropriate  Assessment and Plan:  Diagnoses and all orders for this visit:  Other chest pain/ Abnormal chest x-ray Abn CXR in ED on 08/11/2018 with concern for LML opacity PNA vs malignancy Completed doxycycline course and due follow up CXR - order placed today  Advised to reduce ibuprofen use Will refer  to pain management if CXR not suggestive of malignancy due to chronic pain ongoing since traumatic injury. No opioids written today due to concurrent opioid use. Consider low dose screening CT -     DG Chest 2 View; Future   Follow Up Instructions:    I discussed the assessment and treatment plan with the patient. The patient was provided an opportunity to ask questions and all were answered. The patient agreed with the plan and demonstrated an understanding of the instructions.   The patient was advised to call back or seek an in-person evaluation if the symptoms worsen or if the condition fails to improve as anticipated.  I provided 15 minutes of non-face-to-face time during this encounter.   Izora Ribas, NP

## 2018-08-30 NOTE — Telephone Encounter (Signed)
Patient has a phone visit scheduled.

## 2018-08-31 ENCOUNTER — Encounter: Payer: Self-pay | Admitting: Adult Health

## 2018-08-31 ENCOUNTER — Other Ambulatory Visit: Payer: Self-pay | Admitting: Adult Health

## 2018-08-31 DIAGNOSIS — R0789 Other chest pain: Secondary | ICD-10-CM

## 2018-08-31 DIAGNOSIS — Z720 Tobacco use: Secondary | ICD-10-CM

## 2018-08-31 DIAGNOSIS — R9389 Abnormal findings on diagnostic imaging of other specified body structures: Secondary | ICD-10-CM

## 2018-08-31 MED ORDER — PREDNISONE 20 MG PO TABS: ORAL_TABLET | ORAL | 0 refills | Status: AC

## 2018-08-31 MED ORDER — GABAPENTIN 300 MG PO CAPS: 300.0000 mg | ORAL_CAPSULE | Freq: Three times a day (TID) | ORAL | 2 refills | Status: DC | PRN

## 2018-09-04 ENCOUNTER — Encounter: Payer: Self-pay | Admitting: Internal Medicine

## 2018-09-11 DIAGNOSIS — K5900 Constipation, unspecified: Secondary | ICD-10-CM | POA: Diagnosis not present

## 2018-09-11 DIAGNOSIS — R1084 Generalized abdominal pain: Secondary | ICD-10-CM | POA: Diagnosis not present

## 2018-09-11 DIAGNOSIS — N200 Calculus of kidney: Secondary | ICD-10-CM | POA: Diagnosis not present

## 2018-09-23 ENCOUNTER — Other Ambulatory Visit: Payer: Self-pay | Admitting: Internal Medicine

## 2018-09-23 DIAGNOSIS — F3170 Bipolar disorder, currently in remission, most recent episode unspecified: Secondary | ICD-10-CM

## 2018-09-23 MED ORDER — AMITRIPTYLINE HCL 150 MG PO TABS: ORAL_TABLET | ORAL | 1 refills | Status: DC

## 2018-09-23 MED ORDER — ALPRAZOLAM 1 MG PO TABS: ORAL_TABLET | ORAL | 0 refills | Status: DC

## 2018-10-03 ENCOUNTER — Encounter: Payer: Self-pay | Admitting: Internal Medicine

## 2018-10-04 ENCOUNTER — Encounter: Payer: Self-pay | Admitting: Internal Medicine

## 2018-10-05 ENCOUNTER — Encounter: Payer: Self-pay | Admitting: Internal Medicine

## 2018-10-05 DIAGNOSIS — M545 Low back pain: Secondary | ICD-10-CM | POA: Diagnosis not present

## 2018-10-07 ENCOUNTER — Emergency Department (HOSPITAL_COMMUNITY): Payer: Medicare Other

## 2018-10-07 ENCOUNTER — Inpatient Hospital Stay (HOSPITAL_COMMUNITY)
Admission: EM | Admit: 2018-10-07 | Discharge: 2018-10-15 | DRG: 478 | Disposition: A | Discharge status: Home / Self Care | Payer: Medicare Other | Attending: Internal Medicine | Admitting: Internal Medicine

## 2018-10-07 ENCOUNTER — Other Ambulatory Visit: Payer: Self-pay

## 2018-10-07 ENCOUNTER — Encounter (HOSPITAL_COMMUNITY): Payer: Self-pay | Admitting: Emergency Medicine

## 2018-10-07 DIAGNOSIS — M47814 Spondylosis without myelopathy or radiculopathy, thoracic region: Secondary | ICD-10-CM | POA: Diagnosis not present

## 2018-10-07 DIAGNOSIS — F1010 Alcohol abuse, uncomplicated: Secondary | ICD-10-CM | POA: Diagnosis present

## 2018-10-07 DIAGNOSIS — F319 Bipolar disorder, unspecified: Secondary | ICD-10-CM | POA: Diagnosis present

## 2018-10-07 DIAGNOSIS — Z9049 Acquired absence of other specified parts of digestive tract: Secondary | ICD-10-CM

## 2018-10-07 DIAGNOSIS — M549 Dorsalgia, unspecified: Secondary | ICD-10-CM | POA: Diagnosis not present

## 2018-10-07 DIAGNOSIS — F419 Anxiety disorder, unspecified: Secondary | ICD-10-CM | POA: Diagnosis present

## 2018-10-07 DIAGNOSIS — M4624 Osteomyelitis of vertebra, thoracic region: Principal | ICD-10-CM | POA: Diagnosis present

## 2018-10-07 DIAGNOSIS — M869 Osteomyelitis, unspecified: Secondary | ICD-10-CM | POA: Diagnosis not present

## 2018-10-07 DIAGNOSIS — M4644 Discitis, unspecified, thoracic region: Secondary | ICD-10-CM

## 2018-10-07 DIAGNOSIS — M62838 Other muscle spasm: Secondary | ICD-10-CM | POA: Diagnosis present

## 2018-10-07 DIAGNOSIS — M8628 Subacute osteomyelitis, other site: Secondary | ICD-10-CM

## 2018-10-07 DIAGNOSIS — R001 Bradycardia, unspecified: Secondary | ICD-10-CM | POA: Diagnosis not present

## 2018-10-07 DIAGNOSIS — R0902 Hypoxemia: Secondary | ICD-10-CM | POA: Diagnosis not present

## 2018-10-07 DIAGNOSIS — R0689 Other abnormalities of breathing: Secondary | ICD-10-CM | POA: Diagnosis not present

## 2018-10-07 DIAGNOSIS — M5124 Other intervertebral disc displacement, thoracic region: Secondary | ICD-10-CM | POA: Diagnosis not present

## 2018-10-07 DIAGNOSIS — Z818 Family history of other mental and behavioral disorders: Secondary | ICD-10-CM

## 2018-10-07 DIAGNOSIS — Z87898 Personal history of other specified conditions: Secondary | ICD-10-CM | POA: Diagnosis present

## 2018-10-07 DIAGNOSIS — G894 Chronic pain syndrome: Secondary | ICD-10-CM | POA: Diagnosis present

## 2018-10-07 DIAGNOSIS — F17200 Nicotine dependence, unspecified, uncomplicated: Secondary | ICD-10-CM | POA: Diagnosis present

## 2018-10-07 DIAGNOSIS — Z7982 Long term (current) use of aspirin: Secondary | ICD-10-CM

## 2018-10-07 DIAGNOSIS — F17210 Nicotine dependence, cigarettes, uncomplicated: Secondary | ICD-10-CM | POA: Diagnosis present

## 2018-10-07 DIAGNOSIS — Z79899 Other long term (current) drug therapy: Secondary | ICD-10-CM

## 2018-10-07 DIAGNOSIS — I10 Essential (primary) hypertension: Secondary | ICD-10-CM | POA: Diagnosis present

## 2018-10-07 DIAGNOSIS — K5900 Constipation, unspecified: Secondary | ICD-10-CM | POA: Diagnosis not present

## 2018-10-07 DIAGNOSIS — R7881 Bacteremia: Secondary | ICD-10-CM | POA: Diagnosis present

## 2018-10-07 DIAGNOSIS — E782 Mixed hyperlipidemia: Secondary | ICD-10-CM | POA: Diagnosis present

## 2018-10-07 DIAGNOSIS — R52 Pain, unspecified: Secondary | ICD-10-CM

## 2018-10-07 DIAGNOSIS — R7303 Prediabetes: Secondary | ICD-10-CM | POA: Diagnosis present

## 2018-10-07 DIAGNOSIS — Z87442 Personal history of urinary calculi: Secondary | ICD-10-CM

## 2018-10-07 DIAGNOSIS — Z20828 Contact with and (suspected) exposure to other viral communicable diseases: Secondary | ICD-10-CM | POA: Diagnosis not present

## 2018-10-07 DIAGNOSIS — R079 Chest pain, unspecified: Secondary | ICD-10-CM | POA: Diagnosis not present

## 2018-10-07 DIAGNOSIS — Z885 Allergy status to narcotic agent status: Secondary | ICD-10-CM

## 2018-10-07 DIAGNOSIS — M5489 Other dorsalgia: Secondary | ICD-10-CM | POA: Diagnosis not present

## 2018-10-07 DIAGNOSIS — B182 Chronic viral hepatitis C: Secondary | ICD-10-CM | POA: Diagnosis present

## 2018-10-07 DIAGNOSIS — B9562 Methicillin resistant Staphylococcus aureus infection as the cause of diseases classified elsewhere: Secondary | ICD-10-CM | POA: Diagnosis present

## 2018-10-07 DIAGNOSIS — Z1159 Encounter for screening for other viral diseases: Secondary | ICD-10-CM

## 2018-10-07 DIAGNOSIS — M4640 Discitis, unspecified, site unspecified: Secondary | ICD-10-CM | POA: Diagnosis present

## 2018-10-07 DIAGNOSIS — F1910 Other psychoactive substance abuse, uncomplicated: Secondary | ICD-10-CM | POA: Diagnosis present

## 2018-10-07 LAB — CBC WITH DIFFERENTIAL/PLATELET
Abs Immature Granulocytes: 0.06 10*3/uL (ref 0.00–0.07)
Basophils Absolute: 0.1 10*3/uL (ref 0.0–0.1)
Basophils Relative: 1 %
Eosinophils Absolute: 0.2 10*3/uL (ref 0.0–0.5)
Eosinophils Relative: 2 %
HCT: 41.2 % (ref 39.0–52.0)
Hemoglobin: 13.4 g/dL (ref 13.0–17.0)
Immature Granulocytes: 1 %
Lymphocytes Relative: 17 %
Lymphs Abs: 1.8 10*3/uL (ref 0.7–4.0)
MCH: 31.2 pg (ref 26.0–34.0)
MCHC: 32.5 g/dL (ref 30.0–36.0)
MCV: 95.8 fL (ref 80.0–100.0)
Monocytes Absolute: 1.2 10*3/uL — ABNORMAL HIGH (ref 0.1–1.0)
Monocytes Relative: 11 %
Neutro Abs: 7.2 10*3/uL (ref 1.7–7.7)
Neutrophils Relative %: 68 %
Platelets: 271 10*3/uL (ref 150–400)
RBC: 4.3 MIL/uL (ref 4.22–5.81)
RDW: 13.1 % (ref 11.5–15.5)
WBC: 10.4 10*3/uL (ref 4.0–10.5)
nRBC: 0 % (ref 0.0–0.2)

## 2018-10-07 MED ORDER — MORPHINE SULFATE (PF) 4 MG/ML IV SOLN
4.0000 mg | Freq: Once | INTRAVENOUS | Status: AC
  Administered 2018-10-07: 4 mg via INTRAVENOUS
  Filled 2018-10-07: qty 1

## 2018-10-07 NOTE — ED Triage Notes (Signed)
Patient BIB [facility] from EMS for back pain ( thoracic spine) that radiates around to sides. Pt reports its been getting progressively worse x1 wk, pt did yard work yesterday cause pain to increase. Hx of DDD. Temp 100.2 with EMS. Pt has taken his pain med with little relief.

## 2018-10-07 NOTE — ED Notes (Signed)
Bed: EV20 Expected date:  Expected time:  Means of arrival:  Comments: 58 yo M/ Back pain w/ radiation

## 2018-10-07 NOTE — ED Provider Notes (Signed)
[facility] DEPT Provider Note   CSN: [id number] Arrival date & time: 10/07/18  2114    History   Chief Complaint Chief Complaint  Patient presents with   Back Pain    HPI Howard Fox is a 58 y.o. male.     HPI   58 year old male with history of hypertension, hyperlipidemia, prediabetes, history of prior trauma with prior partial chest wall resection/chronic pain, who presents today with multiple concerns including worsening thoracic back pain over the last several weeks, severe rib pain, right sided abdominal pain, beginning last night and elevated temperature of 100.2 today.    Has history of chronic pain from prior trauma, however over the last 5-6 weeks has been worse.  Went to the ED in March for similar symptoms. Since Last Tues symptoms worsening and saw spine doctor on Thursday who scheduled MRI for this coming Tuesday. Last night, he began to have severe pain from his chest radiating to his back, and also began to have abdominal pain that he describes as spasms. Notes pain is worse on right and specifically right lower pain. No n/v/dysuria or dyspnea.  No loss of bowel or bladder, no focal numbness or weakness.  Began to feel like he had a fever today, 100.2 with EMS on arrival to ED.  No cough.     Past Medical History:  Diagnosis Date   Bipolar 1 disorder ([place])    Hepatitis C    Hypertension    Kidney stones     Patient Active Problem List   Diagnosis Date Noted   Discitis 10/08/2018   Chest wall pain 08/31/2018   Prediabetes 06/26/2018   Hepatitis C, chronic ([place]) 04/01/2015   Tobacco use disorder 04/01/2015   Other abnormal glucose 12/10/2014   Obesity (BMI 30.0-34.9) 12/10/2014   Medication management 12/03/2013   Essential hypertension 04/25/2013   Hyperlipidemia, mixed 04/25/2013   Vitamin D deficiency 04/25/2013   Bipolar disorder ([place]) 02/19/2007    Past Surgical History:  Procedure Laterality  Date   CHOLECYSTECTOMY          Home Medications    Prior to Admission medications   Medication Sig Start Date End Date Taking? Authorizing Provider  ALPRAZolam (XANAX) 1 MG tablet Take 1/2-1 tablet 2 - 3 x /day ONLY if needed for Anxiety Attack &  limit to 5 days /week to avoid addiction Patient taking differently: Take 1 mg by mouth daily.  09/23/18  Yes Unk Pinto, MD  amitriptyline (ELAVIL) 150 MG tablet Take 1 tablet at Bedtime for Sleep & Mood Patient taking differently: Take 150 mg by mouth at bedtime.  09/23/18  Yes Unk Pinto, MD  aspirin EC 325 MG tablet Take 325 mg by mouth daily.    Yes [provider]  carisoprodol (SOMA) 350 MG tablet Take 350 mg by mouth every 6 (six) hours as needed for muscle spasms.    Yes [provider]  Cholecalciferol (VITAMIN D) 50 MCG (2000 UT) tablet Take 10,000 Units by mouth daily.    Yes [provider]  citalopram (CELEXA) 40 MG tablet Take 1 tablet (40 mg total) by mouth daily. 05/02/18  Yes Unk Pinto, MD  Cyanocobalamin (VITAMIN B-12) 1000 MCG SUBL Place 1,000 mcg under the tongue every other day.    Yes [provider]  gabapentin (NEURONTIN) 300 MG capsule Take 1 capsule (300 mg total) by mouth 3 (three) times daily as needed. For pain. 08/31/18  Yes Liane Comber, NP  ibuprofen (  ADVIL,MOTRIN) 200 MG tablet Take 800 mg by mouth every 4 (four) hours as needed for moderate pain. Takes 6 tablets every four hours   Yes [provider]  lisinopril-hydrochlorothiazide (PRINZIDE,ZESTORETIC) 20-25 MG tablet TAKE ONE TABLET BY MOUTH ONCE DAILY Patient taking differently: Take 0.5 tablets by mouth daily.  04/26/17  Yes Unk Pinto, MD  Menthol-Camphor (ICY HOT ADVANCED RELIEF) 16-11 % CREA Apply 1 application topically 3 (three) times daily as needed (pain).   Yes [provider]  oxyCODONE-acetaminophen (PERCOCET) 10-325 MG tablet Take 1 tablet by mouth every 4 (four) hours as  needed for pain.   Yes [provider]  rosuvastatin (CRESTOR) 40 MG tablet Take 1/2 to 1 tablet daily or as directed for Cholesterol Patient taking differently: Take 40 mg by mouth daily.  09/01/17  Yes Unk Pinto, MD  albuterol (PROVENTIL HFA;VENTOLIN HFA) 108 (90 Base) MCG/ACT inhaler INHALE 2 PUFFS BY MOUTH EVERY 4 HOURS AS NEEDED FOR WHEEZING AND FOR SHORTNESS OF BREATH Patient not taking: No sig reported 08/21/17   Unk Pinto, MD  doxycycline (VIBRAMYCIN) 100 MG capsule Take 1 capsule (100 mg total) by mouth 2 (two) times daily. One po bid x 7 days Patient not taking: Reported on 10/07/2018 08/11/18   Molpus, John, MD  HYDROcodone-acetaminophen (NORCO) 5-325 MG tablet Take 1 tablet by mouth every 6 (six) hours as needed (for pain). Patient not taking: Reported on 10/07/2018 08/11/18   Molpus, Jenny Reichmann, MD    Family History Family History  Problem Relation Age of Onset   Mental illness Mother        Bipolar   Stroke Father    Arthritis Father     Social History Social History   Tobacco Use   Smoking status: Current Every Day Smoker    Packs/day: 0.50    Years: 42.00    Pack years: 21.00    Types: Cigarettes    Start date: 01/02/1977   Smokeless tobacco: Never Used  Substance Use Topics   Alcohol use: Yes    Alcohol/week: 2.0 standard drinks    Types: 2 Cans of beer per week   Drug use: Yes    Types: Marijuana     Allergies   Codeine and Dilaudid [hydromorphone hcl]   Review of Systems Review of Systems  Constitutional: Positive for appetite change, fatigue and fever.  HENT: Negative for sore throat.   Eyes: Negative for visual disturbance.  Respiratory: Negative for cough and shortness of breath.   Cardiovascular: Positive for chest pain.  Gastrointestinal: Positive for abdominal pain. Negative for diarrhea, nausea and vomiting.  Genitourinary: Negative for difficulty urinating and dysuria.  Musculoskeletal: Positive for back pain. Negative  for neck stiffness.  Skin: Negative for rash.  Neurological: Negative for syncope, weakness, numbness and headaches.     Physical Exam Updated Vital Signs BP 117/69 (BP Location: Right Arm)    Pulse 75    Temp 98.7 F (37.1 C) (Oral)    Resp 17    Ht 5\' 11"  (1.803 m)    Wt 108.9 kg    SpO2 93%    BMI 33.47 kg/m   Physical Exam Vitals signs and nursing note reviewed.  Constitutional:      General: He is not in acute distress.    Appearance: He is well-developed. He is ill-appearing (appears in pain). He is not diaphoretic.  HENT:     Head: Normocephalic and atraumatic.  Eyes:     Conjunctiva/sclera: Conjunctivae normal.  Neck:  Musculoskeletal: Normal range of motion.  Cardiovascular:     Rate and Rhythm: Normal rate and regular rhythm.  Pulmonary:     Effort: Pulmonary effort is normal. No respiratory distress.  Abdominal:     General: There is no distension.     Palpations: Abdomen is soft.     Tenderness: There is abdominal tenderness (RLQ). There is no guarding.  Musculoskeletal:     Thoracic back: He exhibits tenderness and bony tenderness.  Skin:    General: Skin is warm and dry.  Neurological:     Mental Status: He is alert and oriented to person, place, and time.      ED Treatments / Results  Labs (all labs ordered are listed, but only abnormal results are displayed) Labs Reviewed  CBC WITH DIFFERENTIAL/PLATELET - Abnormal; Notable for the following components:      Result Value   Monocytes Absolute 1.2 (*)    All other components within normal limits  COMPREHENSIVE METABOLIC PANEL - Abnormal; Notable for the following components:   Albumin 3.4 (*)    All other components within normal limits  C-REACTIVE PROTEIN - Abnormal; Notable for the following components:   CRP 7.2 (*)    All other components within normal limits  SEDIMENTATION RATE - Abnormal; Notable for the following components:   Sed Rate 55 (*)    All other components within normal limits    HEMOGLOBIN A1C - Abnormal; Notable for the following components:   Hgb A1c MFr Bld 5.7 (*)    All other components within normal limits  SARS CORONAVIRUS 2 (HOSPITAL ORDER, [facility])  CULTURE, BLOOD (ROUTINE X 2)  CULTURE, BLOOD (ROUTINE X 2)  AEROBIC/ANAEROBIC CULTURE (SURGICAL/DEEP WOUND)  TROPONIN I  LIPASE, BLOOD  PROTIME-INR  HIV ANTIBODY (ROUTINE TESTING W REFLEX)    EKG EKG Interpretation  Date/Time:  Sunday Oct 07 2018 22:00:21 EDT Ventricular Rate:  83 PR Interval:    QRS Duration: 121 QT Interval:  378 QTC Calculation: 445 R Axis:   60 Text Interpretation:  Sinus rhythm IVCD, consider atypical RBBB No significant change since last tracing Confirmed by Gareth Morgan [phone]) on 10/07/2018 10:13:07 PM Also confirmed by Gareth Morgan [phone]), editor Lynder Parents [phone])  on 10/08/2018 8:04:45 AM   Radiology Mr Thoracic Spine W Wo Contrast  Result Date: 10/08/2018 CLINICAL DATA:  Pt has suspected osteomyelitis T10-T11. Pt stated he hurt is back six weeks ago working in garden. No previous back surgeries. EXAM: MRI THORACIC WITHOUT AND WITH CONTRAST TECHNIQUE: Multiplanar and multiecho pulse sequences of the thoracic spine were obtained without and with intravenous contrast. CONTRAST:  10 mL Gadavist COMPARISON:  CT chest 10/08/2018 CT abdomen 09/11/2018 FINDINGS: MRI THORACIC SPINE FINDINGS Alignment:  Physiologic. Vertebrae: Severe marrow edema in the T10 and T11 vertebral bodies with fluid signal within the T10-11 disc space and to lesser extent T11-12 disc space. Severe marrow edema and enhancement of the T10 and T11 vertebral bodies. Severe anterior paravertebral soft tissue edema and enhancement at T11-12 without a drainable component most consistent with a phlegmon. Mild epidural enhancement at T11-12 likely reflecting epidural inflammatory changes given the vertebral body abnormality without a drainable component to suggest an abscess.  No other aggressive osseous lesion.  No acute fracture. Cord:  Normal signal and morphology. Paraspinal and other soft tissues: Severe paravertebral inflammatory changes at T10-11 without a drainable component. Small right pleural effusion. Disc levels: Disc spaces: Degenerative disc disease with disc height loss at  C5-6 and C6-7 with broad-based disc osteophyte complex is on the sagittal images. T1-T2: No disc protrusion, foraminal stenosis or central canal stenosis. T2-T3: Mild broad-based disc bulge. No foraminal or central canal stenosis. T3-T4: Mild broad-based disc bulge. No foraminal or central canal stenosis. T4-T5: No disc protrusion, foraminal stenosis or central canal stenosis. T5-T6: No disc protrusion, foraminal stenosis or central canal stenosis. T6-T7: No disc protrusion, foraminal stenosis or central canal stenosis. T7-T8: No disc protrusion, foraminal stenosis or central canal stenosis. T8-T9: No disc protrusion, foraminal stenosis or central canal stenosis. T9-T10: Small shallow right paracentral disc protrusion. No foraminal or central canal stenosis. T10-T11: No disc protrusion. Epidural inflammatory changes. Moderate bilateral facet arthropathy. Moderate right and mild left foraminal stenosis. T11-T12: No significant disc protrusion. Mild bilateral facet arthropathy. No significant foraminal stenosis. IMPRESSION: 1. Severe marrow edema and enhancement in the T10 and T11 vertebral bodies with fluid signal within the T10-11 disc space and to lesser extent T11-12 disc space most concerning for discitis-osteomyelitis at T10-11. Severe anterior paravertebral soft tissue edema and enhancement at T11-12 without a drainable component most consistent with a phlegmon. Mild epidural enhancement at T11-12 likely reflecting epidural inflammatory changes given the vertebral body abnormality without a drainable component to suggest an abscess. Electronically Signed   By: Kathreen Devoid   On: 10/08/2018 08:24    Ct Aspiration  Result Date: 10/08/2018 CLINICAL DATA:  Thoracic T10-T11 discitis/osteomyelitis with paraspinal phlegmon. EXAM: CT GUIDED ASPIRATION BIOPSY OF THORACIC PARASPINAL PHLEGMON ANESTHESIA/SEDATION: Intravenous Fentanyl 171mcg and Versed 4mg  were administered as conscious sedation during continuous monitoring of the patient's level of consciousness and physiological / cardiorespiratory status by the radiology RN, with a total moderate sedation time of 13 minutes. PROCEDURE: The procedure risks, benefits, and alternatives were explained to the patient. Questions regarding the procedure were encouraged and answered. The patient understands and consents to the procedure. Patient placed prone. Select axial scans through the lower thoracic spine obtained. The T10-11 level was localized and an appropriate skin entry site to the right paraspinal phlegmon was identified and marked. The operative field was prepped with chlorhexidinein a sterile fashion, and a sterile drape was applied covering the operative field. A sterile gown and sterile gloves were used for the procedure. Local anesthesia was provided with 1% Lidocaine. Under CT fluoroscopic guidance, a 18 gauge trocar needle was advanced into the right paraspinal phlegmon. Aspiration returned less than 2 mL bloody fluid, submitted for Gram stain and culture. The guide needle was removed. Postprocedure scans show no hemorrhage or other apparent complication. The patient tolerated the procedure well. COMPLICATIONS: None immediate FINDINGS: The disc changes at T10-T11 consistent with discitis/osteomyelitis were again identified. The paraspinal phlegmon was targeted and CT-guided aspiration sample obtained as above. Gram stain and culture pending. IMPRESSION: 1. Technically successful CT-guided aspiration of right paraspinal phlegmon T10-11. Electronically Signed   By: Lucrezia Europe M.D.   On: 10/08/2018 12:59   Ct T-spine No Charge  Result Date:  10/08/2018 CLINICAL DATA:  58 year old male with chest pain radiating to the back. EXAM: CT THORACIC SPINE WITH CONTRAST TECHNIQUE: Multiplanar CT images of the thoracic spine were reconstructed from contemporary CTA of the Chest. CONTRAST:  No additional. COMPARISON:  CTA chest, Abdomen, and Pelvis today are reported separately. Noncontrast CT Abdomen and Pelvis 09/11/2018. FINDINGS: Segmentation: Normal. Alignment: Overall preserved thoracic kyphosis. Mild levoconvex scoliosis. Vertebrae: Partial destruction of the anterior vertebral bodies at T10-T11 (series 20, image 37) associated with regional paraspinal soft tissue thickening and hyper  enhancement suspicious for discitis osteomyelitis with inflammatory phlegmon. The ventral and right paraspinal soft tissues are primarily affected, relatively sparing the periaortic soft tissues. There is mild associated retrocrural lymphadenopathy with 8 millimeter short axis nodes. No discrete fluid collection. The posterior T10 and T11 vertebral bodies and posterior elements appear to remain intact. There is degenerative appearing right T10 neural foraminal stenosis. Other thoracic vertebrae appear intact. The posterior right ribs appear intact. There are chronic postoperative changes to the left posterior ribs with cerclage wires. Paraspinal and other soft tissues: Abnormal at the right T10-T11 level. Other chest and abdominal visceral findings are reported separately today. Disc levels: Degenerative mild spinal stenosis suspected at T10-T11. IMPRESSION: 1. 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 Follow-up Thoracic Spine MRI without and with contrast may be valuable. 2. No other acute process identified in the thoracic spine. Superimposed degenerative spinal and right foraminal stenosis suspected at T10-T11. 3. CTA chest, Abdomen, and Pelvis today are reported separately. Electronically Signed    By: Genevie Ann M.D.   On: 10/08/2018 01:36   Ct Angio Chest/abd/pel For Dissection W And/or Wo Contrast  Result Date: 10/08/2018 CLINICAL DATA:  58 year old male with chest pain radiating to the back. EXAM: CT ANGIOGRAPHY CHEST, ABDOMEN AND PELVIS TECHNIQUE: Multidetector CT imaging through the chest, abdomen and pelvis was performed using the standard protocol during bolus administration of intravenous contrast. Multiplanar reconstructed images and MIPs were obtained and reviewed to evaluate the vascular anatomy. CONTRAST:  180mL OMNIPAQUE IOHEXOL 350 MG/ML SOLN COMPARISON:  Noncontrast CT Abdomen and Pelvis 09/11/2018. Cardiac CTA 11/14/2008. FINDINGS: CTA CHEST FINDINGS Cardiovascular: Chronic surgical clips along the left mediastinum situated between the left pulmonary artery bifurcation and descending aorta. No calcified coronary artery atherosclerosis is evident. No thoracic aortic aneurysm or dissection. Minimal thoracic aortic atherosclerosis. Normal patency of the central pulmonary arteries. Stable cardiac size since 2010.  No pericardial effusion. Mediastinum/Nodes: Negative.  No lymphadenopathy. Lungs/Pleura: Major airways are patent. Left lower lobectomy suspected. There is chronic subpleural scarring and reticular opacity in both lungs. There is a trace layering right pleural effusion with new enhancing right costophrenic angle atelectasis. Musculoskeletal: Thoracic spine CT reformatted from these images is reported separately today (please see that report). Chronic left posterior rib resections. Review of the MIP images confirms the above findings. CTA ABDOMEN AND PELVIS FINDINGS VASCULAR Major arterial structures are patent. There is iliofemoral atherosclerosis. Review of the MIP images confirms the above findings. NON-VASCULAR Hepatobiliary: Surgically absent gallbladder.  Negative liver. Pancreas: Negative. Spleen: Negative. Adrenals/Urinary Tract: Normal adrenal glands. The kidneys enhance  symmetrically and appear negative. Proximal ureters are decompressed. Mildly distended but otherwise unremarkable urinary bladder. Stomach/Bowel: Redundant large bowel with retained stool and gas distended sigmoid. The cecum is on a lax mesentery in the anterior right upper abdomen. No large bowel inflammation. Normal appendix on series 5, image 153. Negative terminal ileum. No dilated small bowel. Negative stomach and duodenum. No free fluid, free air. Lymphatic: Mild retrocrural lymphadenopathy associated with the abnormal lower thoracic spine, see thoracic spine CT. Other lymph nodes are within normal limits. Reproductive: Negative. Other: No pelvic free fluid. Musculoskeletal: Lumbar segmentation appears normal and the lumbar spine appears intact. There is advanced lower lumbar disc, endplate, and facet degeneration. No acute osseous abnormality identified in the abdomen or pelvis. Review of the MIP images confirms the above findings. IMPRESSION: 1. Abnormal thoracic spine at the T10-T11 level, see dedicated thoracic spine CT reported  separately. 2. Negative for aortic aneurysm or dissection. 3. No other acute or inflammatory process identified in the chest, abdomen, or pelvis. 4. Previous left lower lobectomy suspected.  Chronic lung disease. Electronically Signed   By: Genevie Ann M.D.   On: 10/08/2018 01:29    Procedures Procedures (including critical care time)  Medications Ordered in ED Medications  sodium chloride (PF) 0.9 % injection (has no administration in time range)  morphine 4 MG/ML injection 4 mg (4 mg Intravenous Given 10/08/18 1119)  Muscle Rub CREA (1 application Topical Given 10/08/18 0518)  aspirin EC tablet 325 mg (325 mg Oral Given 10/08/18 1048)  gabapentin (NEURONTIN) capsule 300 mg (300 mg Oral Given 10/08/18 1048)  citalopram (CELEXA) tablet 40 mg (40 mg Oral Given 10/08/18 1048)  amitriptyline (ELAVIL) tablet 150 mg (has no administration in time range)  ALPRAZolam (XANAX) tablet  0.5 mg (has no administration in time range)  carisoprodol (SOMA) tablet 350 mg (has no administration in time range)  oxyCODONE-acetaminophen (PERCOCET/ROXICET) 5-325 MG per tablet 1 tablet (has no administration in time range)    And  oxyCODONE (Oxy IR/ROXICODONE) immediate release tablet 5 mg (has no administration in time range)  lisinopril (ZESTRIL) tablet 10 mg (10 mg Oral Given 10/08/18 1048)    And  hydrochlorothiazide (MICROZIDE) capsule 12.5 mg (12.5 mg Oral Given 10/08/18 1048)  rosuvastatin (CRESTOR) tablet 40 mg (40 mg Oral Given 10/08/18 1103)  midazolam (VERSED) 2 MG/2ML injection (has no administration in time range)  fentaNYL (SUBLIMAZE) 100 MCG/2ML injection (has no administration in time range)  nicotine (NICODERM CQ - dosed in mg/24 hours) patch 14 mg (14 mg Transdermal Patch Applied 10/08/18 1304)  morphine 4 MG/ML injection 4 mg (4 mg Intravenous Given 10/07/18 2224)  iohexol (OMNIPAQUE) 350 MG/ML injection 100 mL (100 mLs Intravenous Contrast Given 10/08/18 0030)  ondansetron (ZOFRAN) injection 4 mg (4 mg Intravenous Given 10/08/18 0158)  gadobutrol (GADAVIST) 1 MMOL/ML injection 10 mL (10 mLs Intravenous Contrast Given 10/08/18 0751)  ibuprofen (ADVIL) tablet 600 mg (600 mg Oral Given 10/08/18 0926)  midazolam (VERSED) injection (1 mg Intravenous Given 10/08/18 1216)  fentaNYL (SUBLIMAZE) injection (50 mcg Intravenous Given 10/08/18 1219)  lidocaine (PF) (XYLOCAINE) 1 % injection (5 mLs  Given 10/08/18 1224)     Initial Impression / Assessment and Plan / ED Course  I have reviewed the triage vital signs and the nursing notes.  Pertinent labs & imaging results that were available during my care of the patient were reviewed by me and considered in my medical decision making (see chart for details).         58 year old male with history of hypertension, hyperlipidemia, prediabetes, history of prior trauma with prior partial chest wall resection/chronic pain, remote IVDU, who  presents today with multiple concerns including worsening thoracic back pain over the last several weeks, severe rib pain, right sided abdominal pain, beginning last night and elevated temperature of 100.2 today.  Wide differential for patient with chest pain, abdominal pain, back pain, and slightly elevated temperature.    Ordered CTA dissection study given chest, back, and abdominal pain.  In addition to evaluation for dissection or aneurysm, feel the study will also assist in further evaluation of patient's right lower quadrant pain and elevated temp r/o appendicitis, and screen for other thoracic back abnormalities or pneumonia.  Troponin negative. Lipase negative.  He does not have symptoms to suggest spinal cord compression.  Has an MRI scheduled for Tuesday with Orthopedics.    CT,  urine, COVID19 test pending at time of transfer to Dr. Florina Ou. Morphine given for pain.   Final Clinical Impressions(s) / ED Diagnoses   Final diagnoses:  Subacute osteomyelitis, other site [facility])    ED Discharge Orders    None       Gareth Morgan, MD 10/08/18 1420

## 2018-10-08 ENCOUNTER — Emergency Department (HOSPITAL_COMMUNITY): Payer: Medicare Other

## 2018-10-08 ENCOUNTER — Inpatient Hospital Stay (HOSPITAL_COMMUNITY): Payer: Medicare Other

## 2018-10-08 ENCOUNTER — Other Ambulatory Visit: Payer: Self-pay

## 2018-10-08 ENCOUNTER — Encounter (HOSPITAL_COMMUNITY): Payer: Self-pay

## 2018-10-08 DIAGNOSIS — M4624 Osteomyelitis of vertebra, thoracic region: Secondary | ICD-10-CM | POA: Diagnosis not present

## 2018-10-08 DIAGNOSIS — M62838 Other muscle spasm: Secondary | ICD-10-CM | POA: Diagnosis present

## 2018-10-08 DIAGNOSIS — K5730 Diverticulosis of large intestine without perforation or abscess without bleeding: Secondary | ICD-10-CM | POA: Diagnosis not present

## 2018-10-08 DIAGNOSIS — R7881 Bacteremia: Secondary | ICD-10-CM | POA: Diagnosis not present

## 2018-10-08 DIAGNOSIS — R825 Elevated urine levels of drugs, medicaments and biological substances: Secondary | ICD-10-CM | POA: Diagnosis not present

## 2018-10-08 DIAGNOSIS — F419 Anxiety disorder, unspecified: Secondary | ICD-10-CM | POA: Diagnosis present

## 2018-10-08 DIAGNOSIS — Z98818 Other dental procedure status: Secondary | ICD-10-CM

## 2018-10-08 DIAGNOSIS — Z9181 History of falling: Secondary | ICD-10-CM | POA: Diagnosis not present

## 2018-10-08 DIAGNOSIS — F17210 Nicotine dependence, cigarettes, uncomplicated: Secondary | ICD-10-CM

## 2018-10-08 DIAGNOSIS — M5124 Other intervertebral disc displacement, thoracic region: Secondary | ICD-10-CM | POA: Diagnosis not present

## 2018-10-08 DIAGNOSIS — E782 Mixed hyperlipidemia: Secondary | ICD-10-CM | POA: Diagnosis not present

## 2018-10-08 DIAGNOSIS — F1010 Alcohol abuse, uncomplicated: Secondary | ICD-10-CM | POA: Diagnosis present

## 2018-10-08 DIAGNOSIS — B9562 Methicillin resistant Staphylococcus aureus infection as the cause of diseases classified elsewhere: Secondary | ICD-10-CM | POA: Diagnosis not present

## 2018-10-08 DIAGNOSIS — I10 Essential (primary) hypertension: Secondary | ICD-10-CM | POA: Diagnosis not present

## 2018-10-08 DIAGNOSIS — R140 Abdominal distension (gaseous): Secondary | ICD-10-CM | POA: Diagnosis not present

## 2018-10-08 DIAGNOSIS — Z818 Family history of other mental and behavioral disorders: Secondary | ICD-10-CM | POA: Diagnosis not present

## 2018-10-08 DIAGNOSIS — B182 Chronic viral hepatitis C: Secondary | ICD-10-CM | POA: Diagnosis not present

## 2018-10-08 DIAGNOSIS — G8929 Other chronic pain: Secondary | ICD-10-CM | POA: Diagnosis not present

## 2018-10-08 DIAGNOSIS — K029 Dental caries, unspecified: Secondary | ICD-10-CM | POA: Diagnosis not present

## 2018-10-08 DIAGNOSIS — R109 Unspecified abdominal pain: Secondary | ICD-10-CM | POA: Diagnosis not present

## 2018-10-08 DIAGNOSIS — R7303 Prediabetes: Secondary | ICD-10-CM | POA: Diagnosis present

## 2018-10-08 DIAGNOSIS — F1910 Other psychoactive substance abuse, uncomplicated: Secondary | ICD-10-CM | POA: Diagnosis present

## 2018-10-08 DIAGNOSIS — K5900 Constipation, unspecified: Secondary | ICD-10-CM | POA: Diagnosis not present

## 2018-10-08 DIAGNOSIS — M4644 Discitis, unspecified, thoracic region: Secondary | ICD-10-CM | POA: Diagnosis not present

## 2018-10-08 DIAGNOSIS — M549 Dorsalgia, unspecified: Secondary | ICD-10-CM | POA: Diagnosis not present

## 2018-10-08 DIAGNOSIS — Z1159 Encounter for screening for other viral diseases: Secondary | ICD-10-CM | POA: Diagnosis not present

## 2018-10-08 DIAGNOSIS — Z7982 Long term (current) use of aspirin: Secondary | ICD-10-CM | POA: Diagnosis not present

## 2018-10-08 DIAGNOSIS — Z79899 Other long term (current) drug therapy: Secondary | ICD-10-CM | POA: Diagnosis not present

## 2018-10-08 DIAGNOSIS — F17200 Nicotine dependence, unspecified, uncomplicated: Secondary | ICD-10-CM | POA: Diagnosis not present

## 2018-10-08 DIAGNOSIS — G061 Intraspinal abscess and granuloma: Secondary | ICD-10-CM | POA: Diagnosis not present

## 2018-10-08 DIAGNOSIS — R079 Chest pain, unspecified: Secondary | ICD-10-CM | POA: Diagnosis not present

## 2018-10-08 DIAGNOSIS — M47814 Spondylosis without myelopathy or radiculopathy, thoracic region: Secondary | ICD-10-CM | POA: Diagnosis not present

## 2018-10-08 DIAGNOSIS — M4640 Discitis, unspecified, site unspecified: Secondary | ICD-10-CM | POA: Diagnosis present

## 2018-10-08 DIAGNOSIS — G894 Chronic pain syndrome: Secondary | ICD-10-CM | POA: Diagnosis present

## 2018-10-08 DIAGNOSIS — R933 Abnormal findings on diagnostic imaging of other parts of digestive tract: Secondary | ICD-10-CM | POA: Diagnosis not present

## 2018-10-08 DIAGNOSIS — Z87442 Personal history of urinary calculi: Secondary | ICD-10-CM | POA: Diagnosis not present

## 2018-10-08 DIAGNOSIS — N200 Calculus of kidney: Secondary | ICD-10-CM | POA: Diagnosis not present

## 2018-10-08 DIAGNOSIS — Z885 Allergy status to narcotic agent status: Secondary | ICD-10-CM | POA: Diagnosis not present

## 2018-10-08 DIAGNOSIS — Z9049 Acquired absence of other specified parts of digestive tract: Secondary | ICD-10-CM | POA: Diagnosis not present

## 2018-10-08 DIAGNOSIS — M4645 Discitis, unspecified, thoracolumbar region: Secondary | ICD-10-CM | POA: Diagnosis not present

## 2018-10-08 DIAGNOSIS — F319 Bipolar disorder, unspecified: Secondary | ICD-10-CM | POA: Diagnosis present

## 2018-10-08 LAB — COMPREHENSIVE METABOLIC PANEL
ALT: 18 U/L (ref 0–44)
AST: 18 U/L (ref 15–41)
Albumin: 3.4 g/dL — ABNORMAL LOW (ref 3.5–5.0)
Alkaline Phosphatase: 84 U/L (ref 38–126)
Anion gap: 8 (ref 5–15)
BUN: 13 mg/dL (ref 6–20)
CO2: 25 mmol/L (ref 22–32)
Calcium: 9.2 mg/dL (ref 8.9–10.3)
Chloride: 104 mmol/L (ref 98–111)
Creatinine, Ser: 0.78 mg/dL (ref 0.61–1.24)
GFR calc Af Amer: >60 mL/min (ref >60)
GFR calc non Af Amer: >60 mL/min (ref >60)
Glucose, Bld: 99 mg/dL (ref 70–99)
Potassium: 4.3 mmol/L (ref 3.5–5.1)
Sodium: 137 mmol/L (ref 135–145)
Total Bilirubin: 0.4 mg/dL (ref 0.3–1.2)
Total Protein: 7.5 g/dL (ref 6.5–8.1)

## 2018-10-08 LAB — LIPASE, BLOOD: Lipase: 21 U/L (ref 11–51)

## 2018-10-08 LAB — SEDIMENTATION RATE: Sed Rate: 55 mm/hr — ABNORMAL HIGH (ref 0–16)

## 2018-10-08 LAB — HEMOGLOBIN A1C
Hgb A1c MFr Bld: 5.7 % — ABNORMAL HIGH (ref 4.8–5.6)
Mean Plasma Glucose: 116.89 mg/dL

## 2018-10-08 LAB — PROTIME-INR
INR: 1 (ref 0.8–1.2)
Prothrombin Time: 13.4 seconds (ref 11.4–15.2)

## 2018-10-08 LAB — TROPONIN I: Troponin I: <0.03 ng/mL (ref <0.03)

## 2018-10-08 LAB — C-REACTIVE PROTEIN: CRP: 7.2 mg/dL — ABNORMAL HIGH (ref <1.0)

## 2018-10-08 LAB — SARS CORONAVIRUS 2 BY RT PCR (HOSPITAL ORDER, PERFORMED IN ~~LOC~~ HOSPITAL LAB): SARS Coronavirus 2: NEGATIVE

## 2018-10-08 MED ORDER — MUSCLE RUB 10-15 % EX CREA
TOPICAL_CREAM | CUTANEOUS | Status: DC | PRN
  Administered 2018-10-08: 1 via TOPICAL
  Administered 2018-10-15: 06:00:00 via TOPICAL
  Filled 2018-10-08: qty 85

## 2018-10-08 MED ORDER — GABAPENTIN 300 MG PO CAPS
300.0000 mg | ORAL_CAPSULE | Freq: Three times a day (TID) | ORAL | Status: DC
  Administered 2018-10-08 – 2018-10-15 (×22): 300 mg via ORAL
  Filled 2018-10-08 (×22): qty 1

## 2018-10-08 MED ORDER — VANCOMYCIN HCL IN DEXTROSE 1-5 GM/200ML-% IV SOLN: 1000.0000 mg | Freq: Once | INTRAVENOUS | Status: DC

## 2018-10-08 MED ORDER — MORPHINE SULFATE (PF) 4 MG/ML IV SOLN
4.0000 mg | INTRAVENOUS | Status: DC | PRN
  Administered 2018-10-08 – 2018-10-13 (×20): 4 mg via INTRAVENOUS
  Filled 2018-10-08 (×21): qty 1

## 2018-10-08 MED ORDER — LISINOPRIL-HYDROCHLOROTHIAZIDE 20-25 MG PO TABS: 0.5000 | ORAL_TABLET | Freq: Every day | ORAL | Status: DC

## 2018-10-08 MED ORDER — LISINOPRIL 10 MG PO TABS
10.0000 mg | ORAL_TABLET | Freq: Every day | ORAL | Status: DC
  Administered 2018-10-08 – 2018-10-15 (×8): 10 mg via ORAL
  Filled 2018-10-08 (×8): qty 1

## 2018-10-08 MED ORDER — ROSUVASTATIN CALCIUM 20 MG PO TABS
40.0000 mg | ORAL_TABLET | Freq: Every day | ORAL | Status: DC
  Administered 2018-10-08 – 2018-10-15 (×8): 40 mg via ORAL
  Filled 2018-10-08 (×9): qty 2

## 2018-10-08 MED ORDER — FENTANYL CITRATE (PF) 100 MCG/2ML IJ SOLN
INTRAMUSCULAR | Status: AC | PRN
  Administered 2018-10-08 (×2): 50 ug via INTRAVENOUS

## 2018-10-08 MED ORDER — HYDROCHLOROTHIAZIDE 12.5 MG PO CAPS
12.5000 mg | ORAL_CAPSULE | Freq: Every day | ORAL | Status: DC
  Administered 2018-10-08 – 2018-10-15 (×8): 12.5 mg via ORAL
  Filled 2018-10-08 (×8): qty 1

## 2018-10-08 MED ORDER — CITALOPRAM HYDROBROMIDE 20 MG PO TABS
40.0000 mg | ORAL_TABLET | Freq: Every day | ORAL | Status: DC
  Administered 2018-10-08 – 2018-10-13 (×6): 40 mg via ORAL
  Filled 2018-10-08: qty 2
  Filled 2018-10-08: qty 4
  Filled 2018-10-08: qty 2
  Filled 2018-10-08: qty 4
  Filled 2018-10-08 (×2): qty 2
  Filled 2018-10-08: qty 4

## 2018-10-08 MED ORDER — SODIUM CHLORIDE (PF) 0.9 % IJ SOLN
INTRAMUSCULAR | Status: AC
  Filled 2018-10-08: qty 50

## 2018-10-08 MED ORDER — OXYCODONE-ACETAMINOPHEN 10-325 MG PO TABS: 1.0000 | ORAL_TABLET | ORAL | Status: DC | PRN

## 2018-10-08 MED ORDER — MIDAZOLAM HCL 2 MG/2ML IJ SOLN
INTRAMUSCULAR | Status: AC
  Filled 2018-10-08: qty 6

## 2018-10-08 MED ORDER — OXYCODONE-ACETAMINOPHEN 5-325 MG PO TABS
1.0000 | ORAL_TABLET | ORAL | Status: DC | PRN
  Administered 2018-10-09 – 2018-10-14 (×19): 1 via ORAL
  Filled 2018-10-08 (×19): qty 1

## 2018-10-08 MED ORDER — ONDANSETRON HCL 4 MG/2ML IJ SOLN
4.0000 mg | Freq: Once | INTRAMUSCULAR | Status: AC
  Administered 2018-10-08: 4 mg via INTRAVENOUS
  Filled 2018-10-08: qty 2

## 2018-10-08 MED ORDER — MIDAZOLAM HCL 2 MG/2ML IJ SOLN
INTRAMUSCULAR | Status: AC | PRN
  Administered 2018-10-08: 1 mg via INTRAVENOUS

## 2018-10-08 MED ORDER — AMITRIPTYLINE HCL 50 MG PO TABS
150.0000 mg | ORAL_TABLET | Freq: Every day | ORAL | Status: DC
  Administered 2018-10-08 – 2018-10-14 (×7): 150 mg via ORAL
  Filled 2018-10-08 (×2): qty 1
  Filled 2018-10-08: qty 6
  Filled 2018-10-08 (×3): qty 1
  Filled 2018-10-08: qty 6

## 2018-10-08 MED ORDER — IBUPROFEN 200 MG PO TABS
600.0000 mg | ORAL_TABLET | Freq: Once | ORAL | Status: AC
  Administered 2018-10-08: 600 mg via ORAL
  Filled 2018-10-08: qty 3

## 2018-10-08 MED ORDER — CARISOPRODOL 350 MG PO TABS
350.0000 mg | ORAL_TABLET | Freq: Four times a day (QID) | ORAL | Status: DC | PRN
  Administered 2018-10-10 – 2018-10-15 (×13): 350 mg via ORAL
  Filled 2018-10-08 (×13): qty 1

## 2018-10-08 MED ORDER — ASPIRIN EC 325 MG PO TBEC
325.0000 mg | DELAYED_RELEASE_TABLET | Freq: Every day | ORAL | Status: DC
  Administered 2018-10-08 – 2018-10-15 (×8): 325 mg via ORAL
  Filled 2018-10-08 (×8): qty 1

## 2018-10-08 MED ORDER — NICOTINE 14 MG/24HR TD PT24
14.0000 mg | MEDICATED_PATCH | Freq: Every day | TRANSDERMAL | Status: DC
  Administered 2018-10-08 – 2018-10-14 (×7): 14 mg via TRANSDERMAL
  Filled 2018-10-08 (×8): qty 1

## 2018-10-08 MED ORDER — FENTANYL CITRATE (PF) 100 MCG/2ML IJ SOLN
INTRAMUSCULAR | Status: AC
  Filled 2018-10-08: qty 4

## 2018-10-08 MED ORDER — GADOBUTROL 1 MMOL/ML IV SOLN
10.0000 mL | Freq: Once | INTRAVENOUS | Status: AC | PRN
  Administered 2018-10-08: 08:00:00 10 mL via INTRAVENOUS

## 2018-10-08 MED ORDER — ROSUVASTATIN CALCIUM 40 MG PO TABS
40.0000 mg | ORAL_TABLET | Freq: Every day | ORAL | Status: DC
  Filled 2018-10-08: qty 1

## 2018-10-08 MED ORDER — ALPRAZOLAM 0.5 MG PO TABS
0.5000 mg | ORAL_TABLET | Freq: Three times a day (TID) | ORAL | Status: DC | PRN
  Administered 2018-10-08 – 2018-10-12 (×8): 0.5 mg via ORAL
  Filled 2018-10-08 (×8): qty 1

## 2018-10-08 MED ORDER — LIDOCAINE HCL (PF) 1 % IJ SOLN
INTRAMUSCULAR | Status: AC | PRN
  Administered 2018-10-08 (×2): 5 mL

## 2018-10-08 MED ORDER — IOHEXOL 350 MG/ML SOLN
100.0000 mL | Freq: Once | INTRAVENOUS | Status: AC | PRN
  Administered 2018-10-08: 100 mL via INTRAVENOUS

## 2018-10-08 MED ORDER — OXYCODONE HCL 5 MG PO TABS
5.0000 mg | ORAL_TABLET | ORAL | Status: DC | PRN
  Administered 2018-10-08 – 2018-10-14 (×20): 5 mg via ORAL
  Filled 2018-10-08 (×20): qty 1

## 2018-10-08 NOTE — Consult Note (Signed)
Date of Admission:  10/07/2018          Reason for Consult: Diskitis    Referring Provider: Dr. Sloan Leiter   Assessment:  1. 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 Multiple infected teeth sp extractions over past year 3. Hx of traumatic fall years ago 4. Chronic Hepatitis C without hepatic coma  Plan:  1. Greatly appreciate IR guided aspirate of disk for culture 2. Hold antibiotics 3. If #1 no growth consider 2nd aspirate to increase yield on cultures 4. Check TTE 5. We will consider risks and benefits of IV vs po treatment here  Principal Problem:   Discitis Active Problems:   Essential hypertension   Hyperlipidemia, mixed   Hepatitis C, chronic (HCC)   Tobacco use disorder   Prediabetes   Scheduled Meds: . amitriptyline  150 mg Oral QHS  . aspirin EC  325 mg Oral Daily  . citalopram  40 mg Oral Daily  . fentaNYL      . gabapentin  300 mg Oral TID  . lisinopril  10 mg Oral Daily   And  . hydrochlorothiazide  12.5 mg Oral Daily  . midazolam      . nicotine  14 mg Transdermal Daily  . rosuvastatin  40 mg Oral Daily   Continuous Infusions: PRN Meds:.ALPRAZolam, carisoprodol, morphine injection, Muscle Rub, oxyCODONE-acetaminophen **AND** oxyCODONE  HPI: Howard Fox is a 58 y.o. male with history of prior traumatic fall 23 years ago with multiple injuries. HE does carry a diagnosis of chronic hepatitis C of which I was initially unaware and always raises the ? Of hx of possible IVDU. I will need to ask him more about this tomorrow. He has been dealing with chronic pain and having injections with orthopedics but no steroids per his report. He developed severe pain in last few weeks with MRI scheduled. HE then became febrile to 100.2. MRI showed vertebral osteo and diskitis at T10-T11.  IR has aspirated this area today for culture  He has no hx of skin infections but does have multiple teeth that have been infected this past  year and which he has undergone repeated extractions.  Certainly if he is active PWID then that opens up another issue  Re risks and benefits of iV antibiotics we need to weight the idea of standard of care IV antibiotics to treat his infection aggressively vs the risk of potentially introducing home health to his home where he lives with an 18 year old mother and risks of COVID 29 will not be inconsequential.       Review of Systems: Review of Systems  Constitutional: Negative for chills, diaphoresis, fever, malaise/fatigue and weight loss.  HENT: Negative for congestion, hearing loss, sore throat and tinnitus.   Eyes: Negative for blurred vision and double vision.  Respiratory: Negative for cough, sputum production, shortness of breath and wheezing.   Cardiovascular: Negative for chest pain, palpitations and leg swelling.  Gastrointestinal: Negative for abdominal pain, blood in stool, constipation, diarrhea, heartburn, melena, nausea and vomiting.  Genitourinary: Negative for dysuria, flank pain and hematuria.  Musculoskeletal: Positive for back pain. Negative for falls, joint pain and myalgias.  Skin: Negative for itching and rash.  Neurological: Negative for dizziness, sensory change, focal weakness, loss of consciousness, weakness and headaches.  Endo/Heme/Allergies: Does not bruise/bleed easily.  Psychiatric/Behavioral: Negative for depression, memory loss and suicidal ideas. The patient is not nervous/anxious.     Past Medical History:  Diagnosis Date  . Bipolar 1 disorder ([place])   . Hepatitis C   . Hypertension   . Kidney stones     Social History   Tobacco Use  . Smoking status: Current Every Day Smoker    Packs/day: 0.50    Years: 42.00    Pack years: 21.00    Types: Cigarettes    Start date: 01/02/1977  . Smokeless tobacco: Never Used  Substance Use Topics  . Alcohol use: Yes    Alcohol/week: 2.0 standard drinks    Types: 2 Cans of beer per week  . Drug use:  Yes    Types: Marijuana    Family History  Problem Relation Age of Onset  . Mental illness Mother        Bipolar  . Stroke Father   . Arthritis Father    Allergies  Allergen Reactions  . Codeine Itching  . Dilaudid [Hydromorphone Hcl] Itching    OBJECTIVE: Blood pressure 117/69, pulse 75, temperature 98.7 F (37.1 C), temperature source Oral, resp. rate 17, height 5\' 11"  (1.803 m), weight 108.9 kg, SpO2 93 %.  Physical Exam Constitutional:      Appearance: He is well-developed.  HENT:     Head: Normocephalic and atraumatic.     Mouth/Throat:     Mouth: Mucous membranes are moist.     Dentition: Dental caries present.     Pharynx: Oropharynx is clear.  Eyes:     Conjunctiva/sclera: Conjunctivae normal.  Neck:     Musculoskeletal: Normal range of motion and neck supple.  Cardiovascular:     Rate and Rhythm: Normal rate and regular rhythm.  Pulmonary:     Effort: Pulmonary effort is normal. No respiratory distress.     Breath sounds: No wheezing.  Abdominal:     General: There is no distension.     Palpations: Abdomen is soft.  Musculoskeletal: Normal range of motion.        General: No tenderness.  Skin:    General: Skin is warm and dry.     Coloration: Skin is not pale.     Findings: No erythema or rash.  Neurological:     General: No focal deficit present.     Mental Status: He is alert and oriented to person, place, and time.  Psychiatric:        Mood and Affect: Mood normal.        Behavior: Behavior normal.        Thought Content: Thought content normal.        Judgment: Judgment normal.     Lab Results Lab Results  Component Value Date   WBC 10.4 10/07/2018   HGB 13.4 10/07/2018   HCT 41.2 10/07/2018   MCV 95.8 10/07/2018   PLT 271 10/07/2018    Lab Results  Component Value Date   CREATININE 0.78 10/07/2018   BUN 13 10/07/2018   NA 137 10/07/2018   K 4.3 10/07/2018   CL 104 10/07/2018   CO2 25 10/07/2018    Lab Results  Component Value  Date   ALT 18 10/07/2018   AST 18 10/07/2018   ALKPHOS 84 10/07/2018   BILITOT 0.4 10/07/2018     Microbiology: Recent Results (from the past 240 hour(s))  SARS Coronavirus 2 (CEPHEID - Performed in [facility] hospital lab), Hosp Order     Status: None   Collection Time: 10/07/18 10:28 PM  Result Value Ref Range Status   SARS Coronavirus 2 NEGATIVE NEGATIVE Final  Comment: (NOTE) If result is NEGATIVE SARS-CoV-2 target nucleic acids are NOT DETECTED. The SARS-CoV-2 RNA is generally detectable in upper and lower  respiratory specimens during the acute phase of infection. The lowest  concentration of SARS-CoV-2 viral copies this assay can detect is 250  copies / mL. A negative result does not preclude SARS-CoV-2 infection  and should not be used as the sole basis for treatment or other  patient management decisions.  A negative result may occur with  improper specimen collection / handling, submission of specimen other  than nasopharyngeal swab, presence of viral mutation(s) within the  areas targeted by this assay, and inadequate number of viral copies  (<250 copies / mL). A negative result must be combined with clinical  observations, patient history, and epidemiological information. If result is POSITIVE SARS-CoV-2 target nucleic acids are DETECTED. The SARS-CoV-2 RNA is generally detectable in upper and lower  respiratory specimens dur ing the acute phase of infection.  Positive  results are indicative of active infection with SARS-CoV-2.  Clinical  correlation with patient history and other diagnostic information is  necessary to determine patient infection status.  Positive results do  not rule out bacterial infection or co-infection with other viruses. If result is PRESUMPTIVE POSTIVE SARS-CoV-2 nucleic acids MAY BE PRESENT.   A presumptive positive result was obtained on the submitted specimen  and confirmed on repeat testing.  While 2019 novel coronavirus   (SARS-CoV-2) nucleic acids may be present in the submitted sample  additional confirmatory testing may be necessary for epidemiological  and / or clinical management purposes  to differentiate between  SARS-CoV-2 and other Sarbecovirus currently known to infect humans.  If clinically indicated additional testing with an alternate test  methodology [phone]) is advised. The SARS-CoV-2 RNA is generally  detectable in upper and lower respiratory sp ecimens during the acute  phase of infection. The expected result is Negative. Fact Sheet for Patients:  StrictlyIdeas.no Fact Sheet for Healthcare Providers: BankingDealers.co.za This test is not yet approved or cleared by the [place] FDA and has been authorized for detection and/or diagnosis of SARS-CoV-2 by FDA under an Emergency Use Authorization (EUA).  This EUA will remain in effect (meaning this test can be used) for the duration of the COVID-19 declaration under Section 564(b)(1) of the Act, 21 U.S.C. section 360bbb-3(b)(1), unless the authorization is terminated or revoked sooner. Performed at [facility], [place] [address] [place], [place] [address]     Alcide Evener, [facility] [facility] [phone] pager  10/08/2018, 4:26 PM

## 2018-10-08 NOTE — ED Provider Notes (Addendum)
Nursing notes and vitals signs, including pulse oximetry, reviewed.  Summary of this visit's results, reviewed by myself:  EKG:  EKG Interpretation  Date/Time:  Sunday Oct 07 2018 22:00:21 EDT Ventricular Rate:  83 PR Interval:    QRS Duration: 121 QT Interval:  378 QTC Calculation: 445 R Axis:   60 Text Interpretation:  Sinus rhythm IVCD, consider atypical RBBB No significant change since last tracing Confirmed by Gareth Morgan [phone]) on 10/07/2018 10:13:07 PM Also confirmed by Gareth Morgan [phone]), editor Lynder Parents [phone])  on 10/08/2018 8:04:45 AM       Labs:  Results for orders placed or performed during the hospital encounter of 10/07/18 (from the past 24 hour(s))  Sedimentation rate     Status: Abnormal   Collection Time: 10/08/18  5:17 AM  Result Value Ref Range   Sed Rate 55 (H) 0 - 16 mm/hr  Hemoglobin A1c     Status: Abnormal   Collection Time: 10/08/18  5:17 AM  Result Value Ref Range   Hgb A1c MFr Bld 5.7 (H) 4.8 - 5.6 %   Mean Plasma Glucose 116.89 mg/dL  Protime-INR     Status: None   Collection Time: 10/08/18 10:57 AM  Result Value Ref Range   Prothrombin Time 13.4 11.4 - 15.2 seconds   INR 1.0 0.8 - 1.2    Imaging Studies: Mr Thoracic Spine W Wo Contrast  Result Date: 10/08/2018 CLINICAL DATA:  Pt has suspected osteomyelitis T10-T11. Pt stated he hurt is back six weeks ago working in garden. No previous back surgeries. EXAM: MRI THORACIC WITHOUT AND WITH CONTRAST TECHNIQUE: Multiplanar and multiecho pulse sequences of the thoracic spine were obtained without and with intravenous contrast. CONTRAST:  10 mL Gadavist COMPARISON:  CT chest 10/08/2018 CT abdomen 09/11/2018 FINDINGS: MRI THORACIC SPINE FINDINGS Alignment:  Physiologic. Vertebrae: Severe marrow edema in the T10 and T11 vertebral bodies with fluid signal within the T10-11 disc space and to lesser extent T11-12 disc space. Severe marrow edema and enhancement of the T10 and T11 vertebral  bodies. Severe anterior paravertebral soft tissue edema and enhancement at T11-12 without a drainable component most consistent with a phlegmon. Mild epidural enhancement at T11-12 likely reflecting epidural inflammatory changes given the vertebral body abnormality without a drainable component to suggest an abscess. No other aggressive osseous lesion.  No acute fracture. Cord:  Normal signal and morphology. Paraspinal and other soft tissues: Severe paravertebral inflammatory changes at T10-11 without a drainable component. Small right pleural effusion. Disc levels: Disc spaces: Degenerative disc disease with disc height loss at C5-6 and C6-7 with broad-based disc osteophyte complex is on the sagittal images. T1-T2: No disc protrusion, foraminal stenosis or central canal stenosis. T2-T3: Mild broad-based disc bulge. No foraminal or central canal stenosis. T3-T4: Mild broad-based disc bulge. No foraminal or central canal stenosis. T4-T5: No disc protrusion, foraminal stenosis or central canal stenosis. T5-T6: No disc protrusion, foraminal stenosis or central canal stenosis. T6-T7: No disc protrusion, foraminal stenosis or central canal stenosis. T7-T8: No disc protrusion, foraminal stenosis or central canal stenosis. T8-T9: No disc protrusion, foraminal stenosis or central canal stenosis. T9-T10: Small shallow right paracentral disc protrusion. No foraminal or central canal stenosis. T10-T11: No disc protrusion. Epidural inflammatory changes. Moderate bilateral facet arthropathy. Moderate right and mild left foraminal stenosis. T11-T12: No significant disc protrusion. Mild bilateral facet arthropathy. No significant foraminal stenosis. IMPRESSION: 1. Severe marrow edema and enhancement in the T10 and T11 vertebral bodies with fluid signal within the T10-11 disc  space and to lesser extent T11-12 disc space most concerning for discitis-osteomyelitis at T10-11. Severe anterior paravertebral soft tissue edema and  enhancement at T11-12 without a drainable component most consistent with a phlegmon. Mild epidural enhancement at T11-12 likely reflecting epidural inflammatory changes given the vertebral body abnormality without a drainable component to suggest an abscess. Electronically Signed   By: Kathreen Devoid   On: 10/08/2018 08:24   Ct Aspiration  Result Date: 10/08/2018 CLINICAL DATA:  Thoracic T10-T11 discitis/osteomyelitis with paraspinal phlegmon. EXAM: CT GUIDED ASPIRATION BIOPSY OF THORACIC PARASPINAL PHLEGMON ANESTHESIA/SEDATION: Intravenous Fentanyl 142mcg and Versed 4mg  were administered as conscious sedation during continuous monitoring of the patient's level of consciousness and physiological / cardiorespiratory status by the radiology RN, with a total moderate sedation time of 13 minutes. PROCEDURE: The procedure risks, benefits, and alternatives were explained to the patient. Questions regarding the procedure were encouraged and answered. The patient understands and consents to the procedure. Patient placed prone. Select axial scans through the lower thoracic spine obtained. The T10-11 level was localized and an appropriate skin entry site to the right paraspinal phlegmon was identified and marked. The operative field was prepped with chlorhexidinein a sterile fashion, and a sterile drape was applied covering the operative field. A sterile gown and sterile gloves were used for the procedure. Local anesthesia was provided with 1% Lidocaine. Under CT fluoroscopic guidance, a 18 gauge trocar needle was advanced into the right paraspinal phlegmon. Aspiration returned less than 2 mL bloody fluid, submitted for Gram stain and culture. The guide needle was removed. Postprocedure scans show no hemorrhage or other apparent complication. The patient tolerated the procedure well. COMPLICATIONS: None immediate FINDINGS: The disc changes at T10-T11 consistent with discitis/osteomyelitis were again identified. The  paraspinal phlegmon was targeted and CT-guided aspiration sample obtained as above. Gram stain and culture pending. IMPRESSION: 1. Technically successful CT-guided aspiration of right paraspinal phlegmon T10-11. Electronically Signed   By: Lucrezia Europe M.D.   On: 10/08/2018 12:59   Ct T-spine No Charge  Result Date: 10/08/2018 CLINICAL DATA:  58 year old male with chest pain radiating to the back. EXAM: CT THORACIC SPINE WITH CONTRAST TECHNIQUE: Multiplanar CT images of the thoracic spine were reconstructed from contemporary CTA of the Chest. CONTRAST:  No additional. COMPARISON:  CTA chest, Abdomen, and Pelvis today are reported separately. Noncontrast CT Abdomen and Pelvis 09/11/2018. FINDINGS: Segmentation: Normal. Alignment: Overall preserved thoracic kyphosis. Mild levoconvex scoliosis. Vertebrae: Partial destruction of the anterior vertebral bodies at T10-T11 (series 20, image 37) associated with regional paraspinal soft tissue thickening and hyper enhancement suspicious for discitis osteomyelitis with inflammatory phlegmon. The ventral and right paraspinal soft tissues are primarily affected, relatively sparing the periaortic soft tissues. There is mild associated retrocrural lymphadenopathy with 8 millimeter short axis nodes. No discrete fluid collection. The posterior T10 and T11 vertebral bodies and posterior elements appear to remain intact. There is degenerative appearing right T10 neural foraminal stenosis. Other thoracic vertebrae appear intact. The posterior right ribs appear intact. There are chronic postoperative changes to the left posterior ribs with cerclage wires. Paraspinal and other soft tissues: Abnormal at the right T10-T11 level. Other chest and abdominal visceral findings are reported separately today. Disc levels: Degenerative mild spinal stenosis suspected at T10-T11. IMPRESSION: 1. Strong evidence of Discitis Osteomyelitis at T10-T11 with right paraspinal phlegmon and reactive  retrocrural lymph nodes. Follow-up Thoracic Spine MRI without and with contrast may be valuable. 2. No other acute process identified in the thoracic spine. Superimposed degenerative spinal  and right foraminal stenosis suspected at T10-T11. 3. CTA chest, Abdomen, and Pelvis today are reported separately. Electronically Signed   By: Genevie Ann M.D.   On: 10/08/2018 01:36   Ct Angio Chest/abd/pel For Dissection W And/or Wo Contrast  Result Date: 10/08/2018 CLINICAL DATA:  59 year old male with chest pain radiating to the back. EXAM: CT ANGIOGRAPHY CHEST, ABDOMEN AND PELVIS TECHNIQUE: Multidetector CT imaging through the chest, abdomen and pelvis was performed using the standard protocol during bolus administration of intravenous contrast. Multiplanar reconstructed images and MIPs were obtained and reviewed to evaluate the vascular anatomy. CONTRAST:  163mL OMNIPAQUE IOHEXOL 350 MG/ML SOLN COMPARISON:  Noncontrast CT Abdomen and Pelvis 09/11/2018. Cardiac CTA 11/14/2008. FINDINGS: CTA CHEST FINDINGS Cardiovascular: Chronic surgical clips along the left mediastinum situated between the left pulmonary artery bifurcation and descending aorta. No calcified coronary artery atherosclerosis is evident. No thoracic aortic aneurysm or dissection. Minimal thoracic aortic atherosclerosis. Normal patency of the central pulmonary arteries. Stable cardiac size since 2010.  No pericardial effusion. Mediastinum/Nodes: Negative.  No lymphadenopathy. Lungs/Pleura: Major airways are patent. Left lower lobectomy suspected. There is chronic subpleural scarring and reticular opacity in both lungs. There is a trace layering right pleural effusion with new enhancing right costophrenic angle atelectasis. Musculoskeletal: Thoracic spine CT reformatted from these images is reported separately today (please see that report). Chronic left posterior rib resections. Review of the MIP images confirms the above findings. CTA ABDOMEN AND PELVIS  FINDINGS VASCULAR Major arterial structures are patent. There is iliofemoral atherosclerosis. Review of the MIP images confirms the above findings. NON-VASCULAR Hepatobiliary: Surgically absent gallbladder.  Negative liver. Pancreas: Negative. Spleen: Negative. Adrenals/Urinary Tract: Normal adrenal glands. The kidneys enhance symmetrically and appear negative. Proximal ureters are decompressed. Mildly distended but otherwise unremarkable urinary bladder. Stomach/Bowel: Redundant large bowel with retained stool and gas distended sigmoid. The cecum is on a lax mesentery in the anterior right upper abdomen. No large bowel inflammation. Normal appendix on series 5, image 153. Negative terminal ileum. No dilated small bowel. Negative stomach and duodenum. No free fluid, free air. Lymphatic: Mild retrocrural lymphadenopathy associated with the abnormal lower thoracic spine, see thoracic spine CT. Other lymph nodes are within normal limits. Reproductive: Negative. Other: No pelvic free fluid. Musculoskeletal: Lumbar segmentation appears normal and the lumbar spine appears intact. There is advanced lower lumbar disc, endplate, and facet degeneration. No acute osseous abnormality identified in the abdomen or pelvis. Review of the MIP images confirms the above findings. IMPRESSION: 1. Abnormal thoracic spine at the T10-T11 level, see dedicated thoracic spine CT reported separately. 2. Negative for aortic aneurysm or dissection. 3. No other acute or inflammatory process identified in the chest, abdomen, or pelvis. 4. Previous left lower lobectomy suspected.  Chronic lung disease. Electronically Signed   By: Genevie Ann M.D.   On: 10/08/2018 01:29      Geremy Rister, Jenny Reichmann, MD 10/08/18 [id number]    Shanon Rosser, MD 10/08/18 [id number]

## 2018-10-08 NOTE — ED Provider Notes (Addendum)
Patient signed to me by Dr. Florina Ou pending MRI of spine which does show evidence of osteomyelitis at T10-T11.  Discussed case with neurosurgeon on-call who recommends patient have IR guided drainage of area.  Also recommends hold antibiotics until this is done. will consult hospitalist for admission   Lacretia Leigh, MD 10/08/18 [id number]    Lacretia Leigh, MD 10/08/18 [phone]

## 2018-10-08 NOTE — Progress Notes (Signed)
Attempted to call for report on pt at 0940.

## 2018-10-08 NOTE — ED Notes (Signed)
ED TO INPATIENT HANDOFF REPORT  Name/Age/Gender Howard Fox 58 y.o. male  Code Status    Code Status Orders  (From admission, onward)         Start     Ordered   10/08/18 0913  Full code  Continuous     10/08/18 0913        Code Status History    This patient has a current code status but no historical code status.      Home/SNF/Other Home  Chief Complaint Back Pain  Level of Care/Admitting Diagnosis ED Disposition    ED Disposition Condition [facility] Area: [facility] [[id number]]  Level of Care: Med-Surg [16]  Covid Evaluation: N/A  Diagnosis: Discitis [810175]  Admitting Physician: Shelly Coss [[id number]]  Attending Physician: Shelly Coss [[id number]]  Estimated length of stay: 3 - 4 days  Certification:: I certify this patient will need inpatient services for at least 2 midnights  PT Class (Do Not Modify): Inpatient [101]  PT Acc Code (Do Not Modify): Private [1]       Medical History Past Medical History:  Diagnosis Date  . Bipolar 1 disorder ([place])   . Hepatitis C   . Hypertension   . Kidney stones     Allergies Allergies  Allergen Reactions  . Codeine Itching  . Dilaudid [Hydromorphone Hcl] Itching    IV Location/Drains/Wounds Patient Lines/Drains/Airways Status   Active Line/Drains/Airways    Name:   Placement date:   Placement time:   Site:   Days:   Peripheral IV 10/07/18 Right Antecubital   10/07/18    2208    Antecubital   1          Labs/Imaging Results for orders placed or performed during the hospital encounter of 10/07/18 (from the past 48 hour(s))  CBC with Differential     Status: Abnormal   Collection Time: 10/07/18  9:50 PM  Result Value Ref Range   WBC 10.4 4.0 - 10.5 K/uL   RBC 4.30 4.22 - 5.81 MIL/uL   Hemoglobin 13.4 13.0 - 17.0 g/dL   HCT 41.2 39.0 - 52.0 %   MCV 95.8 80.0 - 100.0 fL   MCH 31.2 26.0 - 34.0 pg   MCHC 32.5 30.0 - 36.0 g/dL   RDW 13.1 11.5 - 15.5 %    Platelets 271 150 - 400 K/uL   nRBC 0.0 0.0 - 0.2 %   Neutrophils Relative % 68 %   Neutro Abs 7.2 1.7 - 7.7 K/uL   Lymphocytes Relative 17 %   Lymphs Abs 1.8 0.7 - 4.0 K/uL   Monocytes Relative 11 %   Monocytes Absolute 1.2 (H) 0.1 - 1.0 K/uL   Eosinophils Relative 2 %   Eosinophils Absolute 0.2 0.0 - 0.5 K/uL   Basophils Relative 1 %   Basophils Absolute 0.1 0.0 - 0.1 K/uL   Immature Granulocytes 1 %   Abs Immature Granulocytes 0.06 0.00 - 0.07 K/uL    Comment: Performed at [facility], [place] [address], [place], [place] [address]  Comprehensive metabolic panel     Status: Abnormal   Collection Time: 10/07/18  9:50 PM  Result Value Ref Range   Sodium 137 135 - 145 mmol/L   Potassium 4.3 3.5 - 5.1 mmol/L   Chloride 104 98 - 111 mmol/L   CO2 25 22 - 32 mmol/L   Glucose, Bld 99 70 - 99 mg/dL   BUN 13 6 - 20  mg/dL   Creatinine, Ser 0.78 0.61 - 1.24 mg/dL   Calcium 9.2 8.9 - 10.3 mg/dL   Total Protein 7.5 6.5 - 8.1 g/dL   Albumin 3.4 (L) 3.5 - 5.0 g/dL   AST 18 15 - 41 U/L   ALT 18 0 - 44 U/L   Alkaline Phosphatase 84 38 - 126 U/L   Total Bilirubin 0.4 0.3 - 1.2 mg/dL   GFR calc non Af Amer >60 >60 mL/min   GFR calc Af Amer >60 >60 mL/min   Anion gap 8 5 - 15    Comment: Performed at [facility], [place] [address], [place], [place] [address]  Troponin I - ONCE - STAT     Status: None   Collection Time: 10/07/18  9:50 PM  Result Value Ref Range   Troponin I <0.03 <0.03 ng/mL    Comment: Performed at [facility], [place] [address] [place], [place] [address]  Lipase, blood     Status: None   Collection Time: 10/07/18  9:50 PM  Result Value Ref Range   Lipase 21 11 - 51 U/L    Comment: Performed at [facility], [place] [address] [place], [place] [address]  C-reactive protein     Status: Abnormal   Collection Time: 10/07/18  9:50 PM  Result Value Ref Range   CRP 7.2 (H) <1.0 mg/dL    Comment: Performed at  [facility], [place] [address] [place], [place] [address]  SARS Coronavirus 2 (CEPHEID - Performed in [place] hospital lab), Hosp Order     Status: None   Collection Time: 10/07/18 10:28 PM  Result Value Ref Range   SARS Coronavirus 2 NEGATIVE NEGATIVE    Comment: (NOTE) If result is NEGATIVE SARS-CoV-2 target nucleic acids are NOT DETECTED. The SARS-CoV-2 RNA is generally detectable in upper and lower  respiratory specimens during the acute phase of infection. The lowest  concentration of SARS-CoV-2 viral copies this assay can detect is 250  copies / mL. A negative result does not preclude SARS-CoV-2 infection  and should not be used as the sole basis for treatment or other  patient management decisions.  A negative result may occur with  improper specimen collection / handling, submission of specimen other  than nasopharyngeal swab, presence of viral mutation(s) within the  areas targeted by this assay, and inadequate number of viral copies  (<250 copies / mL). A negative result must be combined with clinical  observations, patient history, and epidemiological information. If result is POSITIVE SARS-CoV-2 target nucleic acids are DETECTED. The SARS-CoV-2 RNA is generally detectable in upper and lower  respiratory specimens dur ing the acute phase of infection.  Positive  results are indicative of active infection with SARS-CoV-2.  Clinical  correlation with patient history and other diagnostic information is  necessary to determine patient infection status.  Positive results do  not rule out bacterial infection or co-infection with other viruses. If result is PRESUMPTIVE POSTIVE SARS-CoV-2 nucleic acids MAY BE PRESENT.   A presumptive positive result was obtained on the submitted specimen  and confirmed on repeat testing.  While 2019 novel coronavirus  (SARS-CoV-2) nucleic acids may be present in the submitted sample  additional confirmatory testing may be  necessary for epidemiological  and / or clinical management purposes  to differentiate between  SARS-CoV-2 and other Sarbecovirus currently known to infect humans.  If clinically indicated additional testing with an alternate test  methodology [phone]) is  advised. The SARS-CoV-2 RNA is generally  detectable in upper and lower respiratory sp ecimens during the acute  phase of infection. The expected result is Negative. Fact Sheet for Patients:  StrictlyIdeas.no Fact Sheet for Healthcare Providers: BankingDealers.co.za This test is not yet approved or cleared by the [place] FDA and has been authorized for detection and/or diagnosis of SARS-CoV-2 by FDA under an Emergency Use Authorization (EUA).  This EUA will remain in effect (meaning this test can be used) for the duration of the COVID-19 declaration under Section 564(b)(1) of the Act, 21 U.S.C. section 360bbb-3(b)(1), unless the authorization is terminated or revoked sooner. Performed at [facility], [place] [address] [place], [place] [address]   Sedimentation rate     Status: Abnormal   Collection Time: 10/08/18  5:17 AM  Result Value Ref Range   Sed Rate 55 (H) 0 - 16 mm/hr    Comment: Performed at [facility], [place] [address] [place], [place] [address]   Mr Thoracic Spine W Wo Contrast  Result Date: 10/08/2018 CLINICAL DATA:  Pt has suspected osteomyelitis T10-T11. Pt stated he hurt is back six weeks ago working in garden. No previous back surgeries. EXAM: MRI THORACIC WITHOUT AND WITH CONTRAST TECHNIQUE: Multiplanar and multiecho pulse sequences of the thoracic spine were obtained without and with intravenous contrast. CONTRAST:  10 mL Gadavist COMPARISON:  CT chest 10/08/2018 CT abdomen 09/11/2018 FINDINGS: MRI THORACIC SPINE FINDINGS Alignment:  Physiologic. Vertebrae: Severe marrow edema in the T10 and T11 vertebral bodies with fluid  signal within the T10-11 disc space and to lesser extent T11-12 disc space. Severe marrow edema and enhancement of the T10 and T11 vertebral bodies. Severe anterior paravertebral soft tissue edema and enhancement at T11-12 without a drainable component most consistent with a phlegmon. Mild epidural enhancement at T11-12 likely reflecting epidural inflammatory changes given the vertebral body abnormality without a drainable component to suggest an abscess. No other aggressive osseous lesion.  No acute fracture. Cord:  Normal signal and morphology. Paraspinal and other soft tissues: Severe paravertebral inflammatory changes at T10-11 without a drainable component. Small right pleural effusion. Disc levels: Disc spaces: Degenerative disc disease with disc height loss at C5-6 and C6-7 with broad-based disc osteophyte complex is on the sagittal images. T1-T2: No disc protrusion, foraminal stenosis or central canal stenosis. T2-T3: Mild broad-based disc bulge. No foraminal or central canal stenosis. T3-T4: Mild broad-based disc bulge. No foraminal or central canal stenosis. T4-T5: No disc protrusion, foraminal stenosis or central canal stenosis. T5-T6: No disc protrusion, foraminal stenosis or central canal stenosis. T6-T7: No disc protrusion, foraminal stenosis or central canal stenosis. T7-T8: No disc protrusion, foraminal stenosis or central canal stenosis. T8-T9: No disc protrusion, foraminal stenosis or central canal stenosis. T9-T10: Small shallow right paracentral disc protrusion. No foraminal or central canal stenosis. T10-T11: No disc protrusion. Epidural inflammatory changes. Moderate bilateral facet arthropathy. Moderate right and mild left foraminal stenosis. T11-T12: No significant disc protrusion. Mild bilateral facet arthropathy. No significant foraminal stenosis. IMPRESSION: 1. Severe marrow edema and enhancement in the T10 and T11 vertebral bodies with fluid signal within the T10-11 disc space and to  lesser extent T11-12 disc space most concerning for discitis-osteomyelitis at T10-11. Severe anterior paravertebral soft tissue edema and enhancement at T11-12 without a drainable component most consistent with a phlegmon. Mild epidural enhancement at T11-12 likely reflecting epidural inflammatory changes given the vertebral body abnormality without a drainable component to suggest an abscess. Electronically Signed  By: Kathreen Devoid   On: 10/08/2018 08:24   Ct T-spine No Charge  Result Date: 10/08/2018 CLINICAL DATA:  58 year old male with chest pain radiating to the back. EXAM: CT THORACIC SPINE WITH CONTRAST TECHNIQUE: Multiplanar CT images of the thoracic spine were reconstructed from contemporary CTA of the Chest. CONTRAST:  No additional. COMPARISON:  CTA chest, Abdomen, and Pelvis today are reported separately. Noncontrast CT Abdomen and Pelvis 09/11/2018. FINDINGS: Segmentation: Normal. Alignment: Overall preserved thoracic kyphosis. Mild levoconvex scoliosis. Vertebrae: Partial destruction of the anterior vertebral bodies at T10-T11 (series 20, image 37) associated with regional paraspinal soft tissue thickening and hyper enhancement suspicious for discitis osteomyelitis with inflammatory phlegmon. The ventral and right paraspinal soft tissues are primarily affected, relatively sparing the periaortic soft tissues. There is mild associated retrocrural lymphadenopathy with 8 millimeter short axis nodes. No discrete fluid collection. The posterior T10 and T11 vertebral bodies and posterior elements appear to remain intact. There is degenerative appearing right T10 neural foraminal stenosis. Other thoracic vertebrae appear intact. The posterior right ribs appear intact. There are chronic postoperative changes to the left posterior ribs with cerclage wires. Paraspinal and other soft tissues: Abnormal at the right T10-T11 level. Other chest and abdominal visceral findings are reported separately today. Disc  levels: Degenerative mild spinal stenosis suspected at T10-T11. IMPRESSION: 1. 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 Follow-up Thoracic Spine MRI without and with contrast may be valuable. 2. No other acute process identified in the thoracic spine. Superimposed degenerative spinal and right foraminal stenosis suspected at T10-T11. 3. CTA chest, Abdomen, and Pelvis today are reported separately. Electronically Signed   By: Genevie Ann M.D.   On: 10/08/2018 01:36   Ct Angio Chest/abd/pel For Dissection W And/or Wo Contrast  Result Date: 10/08/2018 CLINICAL DATA:  58 year old male with chest pain radiating to the back. EXAM: CT ANGIOGRAPHY CHEST, ABDOMEN AND PELVIS TECHNIQUE: Multidetector CT imaging through the chest, abdomen and pelvis was performed using the standard protocol during bolus administration of intravenous contrast. Multiplanar reconstructed images and MIPs were obtained and reviewed to evaluate the vascular anatomy. CONTRAST:  175mL OMNIPAQUE IOHEXOL 350 MG/ML SOLN COMPARISON:  Noncontrast CT Abdomen and Pelvis 09/11/2018. Cardiac CTA 11/14/2008. FINDINGS: CTA CHEST FINDINGS Cardiovascular: Chronic surgical clips along the left mediastinum situated between the left pulmonary artery bifurcation and descending aorta. No calcified coronary artery atherosclerosis is evident. No thoracic aortic aneurysm or dissection. Minimal thoracic aortic atherosclerosis. Normal patency of the central pulmonary arteries. Stable cardiac size since 2010.  No pericardial effusion. Mediastinum/Nodes: Negative.  No lymphadenopathy. Lungs/Pleura: Major airways are patent. Left lower lobectomy suspected. There is chronic subpleural scarring and reticular opacity in both lungs. There is a trace layering right pleural effusion with new enhancing right costophrenic angle atelectasis. Musculoskeletal: Thoracic spine CT reformatted from these images  is reported separately today (please see that report). Chronic left posterior rib resections. Review of the MIP images confirms the above findings. CTA ABDOMEN AND PELVIS FINDINGS VASCULAR Major arterial structures are patent. There is iliofemoral atherosclerosis. Review of the MIP images confirms the above findings. NON-VASCULAR Hepatobiliary: Surgically absent gallbladder.  Negative liver. Pancreas: Negative. Spleen: Negative. Adrenals/Urinary Tract: Normal adrenal glands. The kidneys enhance symmetrically and appear negative. Proximal ureters are decompressed. Mildly distended but otherwise unremarkable urinary bladder. Stomach/Bowel: Redundant large bowel with retained stool and gas distended sigmoid. The cecum is on a lax mesentery in the anterior right upper abdomen. No large bowel  inflammation. Normal appendix on series 5, image 153. Negative terminal ileum. No dilated small bowel. Negative stomach and duodenum. No free fluid, free air. Lymphatic: Mild retrocrural lymphadenopathy associated with the abnormal lower thoracic spine, see thoracic spine CT. Other lymph nodes are within normal limits. Reproductive: Negative. Other: No pelvic free fluid. Musculoskeletal: Lumbar segmentation appears normal and the lumbar spine appears intact. There is advanced lower lumbar disc, endplate, and facet degeneration. No acute osseous abnormality identified in the abdomen or pelvis. Review of the MIP images confirms the above findings. IMPRESSION: 1. Abnormal thoracic spine at the T10-T11 level, see dedicated thoracic spine CT reported separately. 2. Negative for aortic aneurysm or dissection. 3. No other acute or inflammatory process identified in the chest, abdomen, or pelvis. 4. Previous left lower lobectomy suspected.  Chronic lung disease. Electronically Signed   By: Genevie Ann M.D.   On: 10/08/2018 01:29    Pending Labs Unresulted Labs (From admission, onward)    Start     Ordered   10/09/18 2094  Basic metabolic  panel  Tomorrow morning,   R     10/08/18 0913   10/09/18 0500  CBC  Tomorrow morning,   R     10/08/18 0913   10/08/18 0921  Protime-INR  ONCE - STAT,   R     10/08/18 0921   10/08/18 0912  HIV antibody (Routine Testing)  Once,   R     10/08/18 0913   10/08/18 0854  Culture, blood (Routine X 2) w Reflex to ID Panel  BLOOD CULTURE X 2,   R     10/08/18 0853          Vitals/Pain Today's Vitals   10/08/18 0200 10/08/18 0310 10/08/18 0439 10/08/18 0818  BP: 136/75  139/86 124/87  Pulse: 80  78 80  Resp: 17  19 16   Temp:      TempSrc:      SpO2: 96%  98% 96%  Weight:      Height:      PainSc:  5       Isolation Precautions No active isolations  Medications Medications  sodium chloride (PF) 0.9 % injection (has no administration in time range)  morphine 4 MG/ML injection 4 mg (4 mg Intravenous Given 10/08/18 0919)  Muscle Rub CREA (1 application Topical Given 10/08/18 0518)  aspirin EC tablet 325 mg (has no administration in time range)  oxyCODONE-acetaminophen (PERCOCET) 10-325 MG per tablet 1 tablet (has no administration in time range)  lisinopril-hydrochlorothiazide (ZESTORETIC) 20-25 MG per tablet 0.5 tablet (has no administration in time range)  rosuvastatin (CRESTOR) tablet 40 mg (has no administration in time range)  gabapentin (NEURONTIN) capsule 300 mg (has no administration in time range)  citalopram (CELEXA) tablet 40 mg (has no administration in time range)  amitriptyline (ELAVIL) tablet 150 mg (has no administration in time range)  ALPRAZolam (XANAX) tablet 0.5 mg (has no administration in time range)  morphine 4 MG/ML injection 4 mg (4 mg Intravenous Given 10/07/18 2224)  iohexol (OMNIPAQUE) 350 MG/ML injection 100 mL (100 mLs Intravenous Contrast Given 10/08/18 0030)  ondansetron (ZOFRAN) injection 4 mg (4 mg Intravenous Given 10/08/18 0158)  gadobutrol (GADAVIST) 1 MMOL/ML injection 10 mL (10 mLs Intravenous Contrast Given 10/08/18 0751)  ibuprofen (ADVIL)  tablet 600 mg (600 mg Oral Given 10/08/18 0926)    Mobility walks

## 2018-10-08 NOTE — H&P (Signed)
History and Physical    Howard Fox CNO:[id number] DOB: 17-Apr-1961 DOA: 10/07/2018  PCP: Unk Pinto, MD   Patient coming from: Home    Chief Complaint: Back pain,fever  HPI: Howard Fox is a 58 y.o. male with medical history significant of hypertension, hyperlipidemia, prediabetes, polytrauma years ago, chronic pain syndrome who presents to the emergency department with complaints of worsening back pain ,left-sided rib pain and mild grade fever.  Patient gives history of fall about 27 years ago and at that time he had polytrauma injuring his intestines, chest.  He underwent exploratory laparotomy, chest wall resection at the time.  Patient has issues with back pain since then.  He takes pain medications for his back pain.  He follows with orthopedics.  Patient is physically active, no problem with ambulation, works at his garden very often at home.  Since last few weeks he gave report that his back pain is getting worse.  He followed with his orthopedics few days ago and MRI was scheduled as an outpatient.  Yesterday, patient started having severe back pain and rib pain on the left side.  He also had fever of 100.2 F at home.  He then presented to the emergency department . Patient seen and examined the bedside in the emergency department.  Currently he is hemodynamically stable, comfortable.  He denies any chest pain, shortness of breath, palpitations, abdominal pain, dysuria, nausea, vomiting, headache, diarrhea, hematochezia or melena.  He denies any contacts with COVID-19.  ED Course: MRI, CT imagings done in the emergency department showed discitis/osteomyelitis at T10-T12 with right paraspinal phlegmon.  Blood cultures obtained.  IR/ID consulted.  Covid-19 found to be negative.     Past Medical History:  Diagnosis Date   Bipolar 1 disorder ([place])    Hepatitis C    Hypertension    Kidney stones     Past Surgical History:  Procedure Laterality Date   CHOLECYSTECTOMY        reports that he has been smoking cigarettes. He started smoking about 41 years ago. He has a 21.00 pack-year smoking history. He has never used smokeless tobacco. He reports current alcohol use of about 2.0 standard drinks of alcohol per week. He reports current drug use. Drug: Marijuana.  Allergies  Allergen Reactions   Codeine Itching   Dilaudid [Hydromorphone Hcl] Itching    Family History  Problem Relation Age of Onset   Mental illness Mother        Bipolar   Stroke Father    Arthritis Father      Prior to Admission medications   Medication Sig Start Date End Date Taking? Authorizing Provider  ALPRAZolam (XANAX) 1 MG tablet Take 1/2-1 tablet 2 - 3 x /day ONLY if needed for Anxiety Attack &  limit to 5 days /week to avoid addiction Patient taking differently: Take 1 mg by mouth daily.  09/23/18  Yes Unk Pinto, MD  amitriptyline (ELAVIL) 150 MG tablet Take 1 tablet at Bedtime for Sleep & Mood Patient taking differently: Take 150 mg by mouth at bedtime.  09/23/18  Yes Unk Pinto, MD  aspirin EC 325 MG tablet Take 325 mg by mouth daily.    Yes [provider]  carisoprodol (SOMA) 350 MG tablet Take 350 mg by mouth every 6 (six) hours as needed for muscle spasms.    Yes [provider]  Cholecalciferol (VITAMIN D) 50 MCG (2000 UT) tablet Take 10,000 Units by mouth daily.    Yes [provider]  citalopram (CELEXA) 40 MG tablet Take 1 tablet (40 mg total) by mouth daily. 05/02/18  Yes Unk Pinto, MD  Cyanocobalamin (VITAMIN B-12) 1000 MCG SUBL Place 1,000 mcg under the tongue every other day.    Yes [provider]  gabapentin (NEURONTIN) 300 MG capsule Take 1 capsule (300 mg total) by mouth 3 (three) times daily as needed. For pain. 08/31/18  Yes Liane Comber, NP  ibuprofen (ADVIL,MOTRIN) 200 MG tablet Take 800 mg by mouth every 4 (four) hours as needed for moderate pain. Takes 6 tablets every four hours   Yes [provider]  lisinopril-hydrochlorothiazide (PRINZIDE,ZESTORETIC) 20-25 MG tablet TAKE ONE TABLET BY MOUTH ONCE DAILY Patient taking differently: Take 0.5 tablets by mouth daily.  04/26/17  Yes Unk Pinto, MD  Menthol-Camphor (ICY HOT ADVANCED RELIEF) 16-11 % CREA Apply 1 application topically 3 (three) times daily as needed (pain).   Yes [provider]  oxyCODONE-acetaminophen (PERCOCET) 10-325 MG tablet Take 1 tablet by mouth every 4 (four) hours as needed for pain.   Yes [provider]  rosuvastatin (CRESTOR) 40 MG tablet Take 1/2 to 1 tablet daily or as directed for Cholesterol Patient taking differently: Take 40 mg by mouth daily.  09/01/17  Yes Unk Pinto, MD  albuterol (PROVENTIL HFA;VENTOLIN HFA) 108 (90 Base) MCG/ACT inhaler INHALE 2 PUFFS BY MOUTH EVERY 4 HOURS AS NEEDED FOR WHEEZING AND FOR SHORTNESS OF BREATH Patient not taking: No sig reported 08/21/17   Unk Pinto, MD  doxycycline (VIBRAMYCIN) 100 MG capsule Take 1 capsule (100 mg total) by mouth 2 (two) times daily. One po bid x 7 days Patient not taking: Reported on 10/07/2018 08/11/18   Molpus, John, MD  HYDROcodone-acetaminophen (NORCO) 5-325 MG tablet Take 1 tablet by mouth every 6 (six) hours as needed (for pain). Patient not taking: Reported on 10/07/2018 08/11/18   Shanon Rosser, MD    Physical Exam: Vitals:   10/08/18 0130 10/08/18 0200 10/08/18 0439 10/08/18 0818  BP: 125/80 136/75 139/86 124/87  Pulse: 83 80 78 80  Resp: 17 17 19 16   Temp:      TempSrc:      SpO2: 96% 96% 98% 96%  Weight:      Height:        Constitutional: NAD, calm, comfortable,obese Vitals:   10/08/18 0130 10/08/18 0200 10/08/18 0439 10/08/18 0818  BP: 125/80 136/75 139/86 124/87  Pulse: 83 80 78 80  Resp: 17 17 19 16   Temp:      TempSrc:      SpO2: 96% 96% 98% 96%  Weight:      Height:       Eyes: PERRL, lids and conjunctivae normal ENMT: Mucous membranes are moist. Posterior pharynx clear of any  exudate or lesions.Normal dentition.  Neck: normal, supple, no masses, no thyromegaly Respiratory: clear to auscultation bilaterally, no wheezing, no crackles. Normal respiratory effort. No accessory muscle use.  Cardiovascular: Regular rate and rhythm, no murmurs / rubs / gallops. No extremity edema. 2+ pedal pulses. No carotid bruits.  Abdomen: no tenderness, no masses palpated. Obese abdomen. Bowel sounds positive.  Linear abdominal scar from previous surgery, left-sided ventral hernia Musculoskeletal: no clubbing / cyanosis. No joint deformity upper and lower extremities. Good ROM, no contractures. Normal muscle tone.  Skin: no rashes, lesions, ulcers. No induration Neurologic: CN 2-12 grossly intact. Sensation intact, DTR normal. Strength 5/5 in all 4.  Psychiatric: Normal judgment and insight. Alert and oriented x 3. Normal mood.  Foley Catheter:None  Labs on Admission: I have personally reviewed following labs and imaging studies  CBC: Recent Labs  Lab 10/07/18 2150  WBC 10.4  NEUTROABS 7.2  HGB 13.4  HCT 41.2  MCV 95.8  PLT 431   Basic Metabolic Panel: Recent Labs  Lab 10/07/18 2150  NA 137  K 4.3  CL 104  CO2 25  GLUCOSE 99  BUN 13  CREATININE 0.78  CALCIUM 9.2   GFR: Estimated Creatinine Clearance: 127.8 mL/min (by C-G formula based on SCr of 0.78 mg/dL). Liver Function Tests: Recent Labs  Lab 10/07/18 2150  AST 18  ALT 18  ALKPHOS 84  BILITOT 0.4  PROT 7.5  ALBUMIN 3.4*   Recent Labs  Lab 10/07/18 2150  LIPASE 21   No results for input(s): AMMONIA in the last 168 hours. Coagulation Profile: No results for input(s): INR, PROTIME in the last 168 hours. Cardiac Enzymes: Recent Labs  Lab 10/07/18 2150  TROPONINI <0.03   BNP (last 3 results) No results for input(s): PROBNP in the last 8760 hours. HbA1C: No results for input(s): HGBA1C in the last 72 hours. CBG: No results for input(s): GLUCAP in the last 168 hours. Lipid Profile: No  results for input(s): CHOL, HDL, LDLCALC, TRIG, CHOLHDL, LDLDIRECT in the last 72 hours. Thyroid Function Tests: No results for input(s): TSH, T4TOTAL, FREET4, T3FREE, THYROIDAB in the last 72 hours. Anemia Panel: No results for input(s): VITAMINB12, FOLATE, FERRITIN, TIBC, IRON, RETICCTPCT in the last 72 hours. Urine analysis:    Component Value Date/Time   COLORURINE COLORLESS (A) 10/28/2017 1125   APPEARANCEUR CLEAR 10/28/2017 1125   LABSPEC 1.001 (L) 10/28/2017 1125   PHURINE 6.0 10/28/2017 1125   GLUCOSEU NEGATIVE 10/28/2017 1125   HGBUR SMALL (A) 10/28/2017 1125   BILIRUBINUR NEGATIVE 10/28/2017 1125   KETONESUR NEGATIVE 10/28/2017 1125   PROTEINUR NEGATIVE 10/28/2017 1125   UROBILINOGEN 0.2 08/26/2012 1453   NITRITE NEGATIVE 10/28/2017 1125   LEUKOCYTESUR NEGATIVE 10/28/2017 1125    Radiological Exams on Admission: Mr Thoracic Spine W Wo Contrast  Result Date: 10/08/2018 CLINICAL DATA:  Pt has suspected osteomyelitis T10-T11. Pt stated he hurt is back six weeks ago working in garden. No previous back surgeries. EXAM: MRI THORACIC WITHOUT AND WITH CONTRAST TECHNIQUE: Multiplanar and multiecho pulse sequences of the thoracic spine were obtained without and with intravenous contrast. CONTRAST:  10 mL Gadavist COMPARISON:  CT chest 10/08/2018 CT abdomen 09/11/2018 FINDINGS: MRI THORACIC SPINE FINDINGS Alignment:  Physiologic. Vertebrae: Severe marrow edema in the T10 and T11 vertebral bodies with fluid signal within the T10-11 disc space and to lesser extent T11-12 disc space. Severe marrow edema and enhancement of the T10 and T11 vertebral bodies. Severe anterior paravertebral soft tissue edema and enhancement at T11-12 without a drainable component most consistent with a phlegmon. Mild epidural enhancement at T11-12 likely reflecting epidural inflammatory changes given the vertebral body abnormality without a drainable component to suggest an abscess. No other aggressive osseous lesion.   No acute fracture. Cord:  Normal signal and morphology. Paraspinal and other soft tissues: Severe paravertebral inflammatory changes at T10-11 without a drainable component. Small right pleural effusion. Disc levels: Disc spaces: Degenerative disc disease with disc height loss at C5-6 and C6-7 with broad-based disc osteophyte complex is on the sagittal images. T1-T2: No disc protrusion, foraminal stenosis or central canal stenosis. T2-T3: Mild broad-based disc bulge. No foraminal or central canal stenosis. T3-T4: Mild broad-based disc bulge. No foraminal or central canal stenosis. T4-T5: No  disc protrusion, foraminal stenosis or central canal stenosis. T5-T6: No disc protrusion, foraminal stenosis or central canal stenosis. T6-T7: No disc protrusion, foraminal stenosis or central canal stenosis. T7-T8: No disc protrusion, foraminal stenosis or central canal stenosis. T8-T9: No disc protrusion, foraminal stenosis or central canal stenosis. T9-T10: Small shallow right paracentral disc protrusion. No foraminal or central canal stenosis. T10-T11: No disc protrusion. Epidural inflammatory changes. Moderate bilateral facet arthropathy. Moderate right and mild left foraminal stenosis. T11-T12: No significant disc protrusion. Mild bilateral facet arthropathy. No significant foraminal stenosis. IMPRESSION: 1. Severe marrow edema and enhancement in the T10 and T11 vertebral bodies with fluid signal within the T10-11 disc space and to lesser extent T11-12 disc space most concerning for discitis-osteomyelitis at T10-11. Severe anterior paravertebral soft tissue edema and enhancement at T11-12 without a drainable component most consistent with a phlegmon. Mild epidural enhancement at T11-12 likely reflecting epidural inflammatory changes given the vertebral body abnormality without a drainable component to suggest an abscess. Electronically Signed   By: Kathreen Devoid   On: 10/08/2018 08:24   Ct T-spine No Charge  Result  Date: 10/08/2018 CLINICAL DATA:  57 year old male with chest pain radiating to the back. EXAM: CT THORACIC SPINE WITH CONTRAST TECHNIQUE: Multiplanar CT images of the thoracic spine were reconstructed from contemporary CTA of the Chest. CONTRAST:  No additional. COMPARISON:  CTA chest, Abdomen, and Pelvis today are reported separately. Noncontrast CT Abdomen and Pelvis 09/11/2018. FINDINGS: Segmentation: Normal. Alignment: Overall preserved thoracic kyphosis. Mild levoconvex scoliosis. Vertebrae: Partial destruction of the anterior vertebral bodies at T10-T11 (series 20, image 37) associated with regional paraspinal soft tissue thickening and hyper enhancement suspicious for discitis osteomyelitis with inflammatory phlegmon. The ventral and right paraspinal soft tissues are primarily affected, relatively sparing the periaortic soft tissues. There is mild associated retrocrural lymphadenopathy with 8 millimeter short axis nodes. No discrete fluid collection. The posterior T10 and T11 vertebral bodies and posterior elements appear to remain intact. There is degenerative appearing right T10 neural foraminal stenosis. Other thoracic vertebrae appear intact. The posterior right ribs appear intact. There are chronic postoperative changes to the left posterior ribs with cerclage wires. Paraspinal and other soft tissues: Abnormal at the right T10-T11 level. Other chest and abdominal visceral findings are reported separately today. Disc levels: Degenerative mild spinal stenosis suspected at T10-T11. IMPRESSION: 1. 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 Follow-up Thoracic Spine MRI without and with contrast may be valuable. 2. No other acute process identified in the thoracic spine. Superimposed degenerative spinal and right foraminal stenosis suspected at T10-T11. 3. CTA chest, Abdomen, and Pelvis today are reported separately. Electronically  Signed   By: Genevie Ann M.D.   On: 10/08/2018 01:36   Ct Angio Chest/abd/pel For Dissection W And/or Wo Contrast  Result Date: 10/08/2018 CLINICAL DATA:  58 year old male with chest pain radiating to the back. EXAM: CT ANGIOGRAPHY CHEST, ABDOMEN AND PELVIS TECHNIQUE: Multidetector CT imaging through the chest, abdomen and pelvis was performed using the standard protocol during bolus administration of intravenous contrast. Multiplanar reconstructed images and MIPs were obtained and reviewed to evaluate the vascular anatomy. CONTRAST:  182mL OMNIPAQUE IOHEXOL 350 MG/ML SOLN COMPARISON:  Noncontrast CT Abdomen and Pelvis 09/11/2018. Cardiac CTA 11/14/2008. FINDINGS: CTA CHEST FINDINGS Cardiovascular: Chronic surgical clips along the left mediastinum situated between the left pulmonary artery bifurcation and descending aorta. No calcified coronary artery atherosclerosis is evident. No thoracic aortic aneurysm or dissection. Minimal thoracic aortic  atherosclerosis. Normal patency of the central pulmonary arteries. Stable cardiac size since 2010.  No pericardial effusion. Mediastinum/Nodes: Negative.  No lymphadenopathy. Lungs/Pleura: Major airways are patent. Left lower lobectomy suspected. There is chronic subpleural scarring and reticular opacity in both lungs. There is a trace layering right pleural effusion with new enhancing right costophrenic angle atelectasis. Musculoskeletal: Thoracic spine CT reformatted from these images is reported separately today (please see that report). Chronic left posterior rib resections. Review of the MIP images confirms the above findings. CTA ABDOMEN AND PELVIS FINDINGS VASCULAR Major arterial structures are patent. There is iliofemoral atherosclerosis. Review of the MIP images confirms the above findings. NON-VASCULAR Hepatobiliary: Surgically absent gallbladder.  Negative liver. Pancreas: Negative. Spleen: Negative. Adrenals/Urinary Tract: Normal adrenal glands. The kidneys  enhance symmetrically and appear negative. Proximal ureters are decompressed. Mildly distended but otherwise unremarkable urinary bladder. Stomach/Bowel: Redundant large bowel with retained stool and gas distended sigmoid. The cecum is on a lax mesentery in the anterior right upper abdomen. No large bowel inflammation. Normal appendix on series 5, image 153. Negative terminal ileum. No dilated small bowel. Negative stomach and duodenum. No free fluid, free air. Lymphatic: Mild retrocrural lymphadenopathy associated with the abnormal lower thoracic spine, see thoracic spine CT. Other lymph nodes are within normal limits. Reproductive: Negative. Other: No pelvic free fluid. Musculoskeletal: Lumbar segmentation appears normal and the lumbar spine appears intact. There is advanced lower lumbar disc, endplate, and facet degeneration. No acute osseous abnormality identified in the abdomen or pelvis. Review of the MIP images confirms the above findings. IMPRESSION: 1. Abnormal thoracic spine at the T10-T11 level, see dedicated thoracic spine CT reported separately. 2. Negative for aortic aneurysm or dissection. 3. No other acute or inflammatory process identified in the chest, abdomen, or pelvis. 4. Previous left lower lobectomy suspected.  Chronic lung disease. Electronically Signed   By: Genevie Ann M.D.   On: 10/08/2018 01:29     Assessment/Plan Active Problems:   Discitis  Discitis/osteomyelitis: Presented with back pain, fever.  MRI thoracic spine showed severe marrow edema and enhancement in the T10 and T11 vertebral bodies  concerning for discitis-osteomyelitis at T10-11. Also showed severe anterior paravertebral soft tissue edema and enhancement at T11-12 without a drainable component most consistent with a phlegmon. We will hold antibiotics for now.  Will call IR for drainage of the discitis area.  Follow-up cultures including blood cultures, drainage cultures. I have requested for ID consultation.  ID will  manage antibiotics after IR drainage. Continue patient is hemodynamically stable.  Had a fever of 101 F when he presented.  Hypertension: Currently blood pressure stable.  Resume home medications.  Hyperlipidemia: Continue Crestor  Hepatitis C: Follows as an outpatient with liver clinic.  Not treated yet.  We recommend continued follow-up as an outpatient.  Smoker: Smokes half pack a day for last several years.  Counseled for smoking cessation.  Will order nicotine patch.  Chronic alcohol abuse: Drinks 3 bottles of 12 ounce beer every day.  Counseled for cessation.  Monitor for withdrawal.  Prediabetes: Not on medications at home.  Will check hemoglobin A1c level  Chronic pain syndrome/chronic back pain/anxiety: Follows with orthopedics as an outpatient.  Continue supportive care,pain meds.  Has history of muscle spasms.  Resume his home meds     Severity of Illness: The appropriate patient status for this patient is INPATIENT. Needs IR guided drainage of the discitis and prolonged IV antibiotics.    DVT prophylaxis: SCD Code Status: Full Family Communication:  None present at the bedside Consults called: IR, ID     Shelly Coss MD [facility] Pager [phone]  If 7PM-7AM, please contact night-coverage [email or web address] Password [facility]  10/08/2018, 9:13 AM

## 2018-10-08 NOTE — Procedures (Signed)
  Procedure: CT guided aspiration of R T10/11 paraspinal phlegmon 75ml bloody fluid EBL:   minimal Complications:  none immediate  See full dictation in [facility].  Dillard Cannon MD Main # [phone] Pager  [phone]

## 2018-10-08 NOTE — ED Notes (Signed)
Patient transported to MRI 

## 2018-10-08 NOTE — Progress Notes (Signed)
Referring Physician(s): Adhikari,A  Supervising Physician: Arne Cleveland  Patient Status:  Dirk Dress IP  Chief Complaint:  Back pain  Subjective: Patient familiar to IR service from prior random core liver biopsy in 2009.  He has a past medical history significant for hypertension, hyperlipidemia, nephrolithiasis, prediabetes, tobacco/alcohol abuse, hepatitis C, as well as polytrauma years ago and subsequent chronic pain syndrome who presented to [facility] ED on 5/17 with worsening back pain as well as left-sided rib pain and mild temperature elevation.  Subsequent imaging revealed:  Severe marrow edema and enhancement in the T10 and T11 vertebral bodies with fluid signal within the T10-11 disc space and to lesser extent T11-12 disc space most concerning for discitis-osteomyelitis at T10-11. Severe anterior paravertebral soft tissue edema and enhancement at T11-12 without a drainable component most consistent with a phlegmon. Mild epidural enhancement at T11-12 likely reflecting epidural inflammatory changes given the vertebral body abnormality without a drainable component to suggest an abscess.   He has normal CBC, and is COVID negative.  Request now received for CT-guided aspiration of T10-11 disc space for further evaluation.  Past Medical History:  Diagnosis Date   Bipolar 1 disorder ([place])    Hepatitis C    Hypertension    Kidney stones    Past Surgical History:  Procedure Laterality Date   CHOLECYSTECTOMY        Allergies: Codeine and Dilaudid [hydromorphone hcl]  Medications: Prior to Admission medications   Medication Sig Start Date End Date Taking? Authorizing Provider  ALPRAZolam (XANAX) 1 MG tablet Take 1/2-1 tablet 2 - 3 x /day ONLY if needed for Anxiety Attack &  limit to 5 days /week to avoid addiction Patient taking differently: Take 1 mg by mouth daily.  09/23/18  Yes Unk Pinto, MD  amitriptyline (ELAVIL) 150 MG tablet Take 1 tablet at  Bedtime for Sleep & Mood Patient taking differently: Take 150 mg by mouth at bedtime.  09/23/18  Yes Unk Pinto, MD  aspirin EC 325 MG tablet Take 325 mg by mouth daily.    Yes [provider]  carisoprodol (SOMA) 350 MG tablet Take 350 mg by mouth every 6 (six) hours as needed for muscle spasms.    Yes [provider]  Cholecalciferol (VITAMIN D) 50 MCG (2000 UT) tablet Take 10,000 Units by mouth daily.    Yes [provider]  citalopram (CELEXA) 40 MG tablet Take 1 tablet (40 mg total) by mouth daily. 05/02/18  Yes Unk Pinto, MD  Cyanocobalamin (VITAMIN B-12) 1000 MCG SUBL Place 1,000 mcg under the tongue every other day.    Yes [provider]  gabapentin (NEURONTIN) 300 MG capsule Take 1 capsule (300 mg total) by mouth 3 (three) times daily as needed. For pain. 08/31/18  Yes Liane Comber, NP  ibuprofen (ADVIL,MOTRIN) 200 MG tablet Take 800 mg by mouth every 4 (four) hours as needed for moderate pain. Takes 6 tablets every four hours   Yes [provider]  lisinopril-hydrochlorothiazide (PRINZIDE,ZESTORETIC) 20-25 MG tablet TAKE ONE TABLET BY MOUTH ONCE DAILY Patient taking differently: Take 0.5 tablets by mouth daily.  04/26/17  Yes Unk Pinto, MD  Menthol-Camphor (ICY HOT ADVANCED RELIEF) 16-11 % CREA Apply 1 application topically 3 (three) times daily as needed (pain).   Yes [provider]  oxyCODONE-acetaminophen (PERCOCET) 10-325 MG tablet Take 1 tablet by mouth every 4 (four) hours as needed for pain.   Yes [provider]  rosuvastatin (CRESTOR) 40 MG tablet Take 1/2  to 1 tablet daily or as directed for Cholesterol Patient taking differently: Take 40 mg by mouth daily.  09/01/17  Yes Unk Pinto, MD  albuterol (PROVENTIL HFA;VENTOLIN HFA) 108 (90 Base) MCG/ACT inhaler INHALE 2 PUFFS BY MOUTH EVERY 4 HOURS AS NEEDED FOR WHEEZING AND FOR SHORTNESS OF BREATH Patient not taking: No sig reported 08/21/17    Unk Pinto, MD  doxycycline (VIBRAMYCIN) 100 MG capsule Take 1 capsule (100 mg total) by mouth 2 (two) times daily. One po bid x 7 days Patient not taking: Reported on 10/07/2018 08/11/18   Molpus, John, MD  HYDROcodone-acetaminophen (NORCO) 5-325 MG tablet Take 1 tablet by mouth every 6 (six) hours as needed (for pain). Patient not taking: Reported on 10/07/2018 08/11/18   Molpus, Jenny Reichmann, MD     Vital Signs: BP 124/87 (BP Location: Left Arm)    Pulse 80    Temp 98.9 F (37.2 C) (Oral)    Resp 16    Ht 5\' 11"  (1.803 m)    Wt 240 lb (108.9 kg)    SpO2 96%    BMI 33.47 kg/m   Physical Exam awake/alert; chest- CTA bilat; heart- RRR; abd- soft, +BS,NT; no LE edema  Imaging: Mr Thoracic Spine W Wo Contrast  Result Date: 10/08/2018 CLINICAL DATA:  Pt has suspected osteomyelitis T10-T11. Pt stated he hurt is back six weeks ago working in garden. No previous back surgeries. EXAM: MRI THORACIC WITHOUT AND WITH CONTRAST TECHNIQUE: Multiplanar and multiecho pulse sequences of the thoracic spine were obtained without and with intravenous contrast. CONTRAST:  10 mL Gadavist COMPARISON:  CT chest 10/08/2018 CT abdomen 09/11/2018 FINDINGS: MRI THORACIC SPINE FINDINGS Alignment:  Physiologic. Vertebrae: Severe marrow edema in the T10 and T11 vertebral bodies with fluid signal within the T10-11 disc space and to lesser extent T11-12 disc space. Severe marrow edema and enhancement of the T10 and T11 vertebral bodies. Severe anterior paravertebral soft tissue edema and enhancement at T11-12 without a drainable component most consistent with a phlegmon. Mild epidural enhancement at T11-12 likely reflecting epidural inflammatory changes given the vertebral body abnormality without a drainable component to suggest an abscess. No other aggressive osseous lesion.  No acute fracture. Cord:  Normal signal and morphology. Paraspinal and other soft tissues: Severe paravertebral inflammatory changes at T10-11 without a  drainable component. Small right pleural effusion. Disc levels: Disc spaces: Degenerative disc disease with disc height loss at C5-6 and C6-7 with broad-based disc osteophyte complex is on the sagittal images. T1-T2: No disc protrusion, foraminal stenosis or central canal stenosis. T2-T3: Mild broad-based disc bulge. No foraminal or central canal stenosis. T3-T4: Mild broad-based disc bulge. No foraminal or central canal stenosis. T4-T5: No disc protrusion, foraminal stenosis or central canal stenosis. T5-T6: No disc protrusion, foraminal stenosis or central canal stenosis. T6-T7: No disc protrusion, foraminal stenosis or central canal stenosis. T7-T8: No disc protrusion, foraminal stenosis or central canal stenosis. T8-T9: No disc protrusion, foraminal stenosis or central canal stenosis. T9-T10: Small shallow right paracentral disc protrusion. No foraminal or central canal stenosis. T10-T11: No disc protrusion. Epidural inflammatory changes. Moderate bilateral facet arthropathy. Moderate right and mild left foraminal stenosis. T11-T12: No significant disc protrusion. Mild bilateral facet arthropathy. No significant foraminal stenosis. IMPRESSION: 1. Severe marrow edema and enhancement in the T10 and T11 vertebral bodies with fluid signal within the T10-11 disc space and to lesser extent T11-12 disc space most concerning for discitis-osteomyelitis at T10-11. Severe anterior paravertebral soft tissue edema and enhancement at T11-12 without a  drainable component most consistent with a phlegmon. Mild epidural enhancement at T11-12 likely reflecting epidural inflammatory changes given the vertebral body abnormality without a drainable component to suggest an abscess. Electronically Signed   By: Kathreen Devoid   On: 10/08/2018 08:24   Ct T-spine No Charge  Result Date: 10/08/2018 CLINICAL DATA:  58 year old male with chest pain radiating to the back. EXAM: CT THORACIC SPINE WITH CONTRAST TECHNIQUE: Multiplanar CT  images of the thoracic spine were reconstructed from contemporary CTA of the Chest. CONTRAST:  No additional. COMPARISON:  CTA chest, Abdomen, and Pelvis today are reported separately. Noncontrast CT Abdomen and Pelvis 09/11/2018. FINDINGS: Segmentation: Normal. Alignment: Overall preserved thoracic kyphosis. Mild levoconvex scoliosis. Vertebrae: Partial destruction of the anterior vertebral bodies at T10-T11 (series 20, image 37) associated with regional paraspinal soft tissue thickening and hyper enhancement suspicious for discitis osteomyelitis with inflammatory phlegmon. The ventral and right paraspinal soft tissues are primarily affected, relatively sparing the periaortic soft tissues. There is mild associated retrocrural lymphadenopathy with 8 millimeter short axis nodes. No discrete fluid collection. The posterior T10 and T11 vertebral bodies and posterior elements appear to remain intact. There is degenerative appearing right T10 neural foraminal stenosis. Other thoracic vertebrae appear intact. The posterior right ribs appear intact. There are chronic postoperative changes to the left posterior ribs with cerclage wires. Paraspinal and other soft tissues: Abnormal at the right T10-T11 level. Other chest and abdominal visceral findings are reported separately today. Disc levels: Degenerative mild spinal stenosis suspected at T10-T11. IMPRESSION: 1. 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 Follow-up Thoracic Spine MRI without and with contrast may be valuable. 2. No other acute process identified in the thoracic spine. Superimposed degenerative spinal and right foraminal stenosis suspected at T10-T11. 3. CTA chest, Abdomen, and Pelvis today are reported separately. Electronically Signed   By: Genevie Ann M.D.   On: 10/08/2018 01:36   Ct Angio Chest/abd/pel For Dissection W And/or Wo Contrast  Result Date: 10/08/2018 CLINICAL DATA:   58 year old male with chest pain radiating to the back. EXAM: CT ANGIOGRAPHY CHEST, ABDOMEN AND PELVIS TECHNIQUE: Multidetector CT imaging through the chest, abdomen and pelvis was performed using the standard protocol during bolus administration of intravenous contrast. Multiplanar reconstructed images and MIPs were obtained and reviewed to evaluate the vascular anatomy. CONTRAST:  145mL OMNIPAQUE IOHEXOL 350 MG/ML SOLN COMPARISON:  Noncontrast CT Abdomen and Pelvis 09/11/2018. Cardiac CTA 11/14/2008. FINDINGS: CTA CHEST FINDINGS Cardiovascular: Chronic surgical clips along the left mediastinum situated between the left pulmonary artery bifurcation and descending aorta. No calcified coronary artery atherosclerosis is evident. No thoracic aortic aneurysm or dissection. Minimal thoracic aortic atherosclerosis. Normal patency of the central pulmonary arteries. Stable cardiac size since 2010.  No pericardial effusion. Mediastinum/Nodes: Negative.  No lymphadenopathy. Lungs/Pleura: Major airways are patent. Left lower lobectomy suspected. There is chronic subpleural scarring and reticular opacity in both lungs. There is a trace layering right pleural effusion with new enhancing right costophrenic angle atelectasis. Musculoskeletal: Thoracic spine CT reformatted from these images is reported separately today (please see that report). Chronic left posterior rib resections. Review of the MIP images confirms the above findings. CTA ABDOMEN AND PELVIS FINDINGS VASCULAR Major arterial structures are patent. There is iliofemoral atherosclerosis. Review of the MIP images confirms the above findings. NON-VASCULAR Hepatobiliary: Surgically absent gallbladder.  Negative liver. Pancreas: Negative. Spleen: Negative. Adrenals/Urinary Tract: Normal adrenal glands. The kidneys enhance symmetrically and appear negative. Proximal ureters are decompressed.  Mildly distended but otherwise unremarkable urinary bladder. Stomach/Bowel:  Redundant large bowel with retained stool and gas distended sigmoid. The cecum is on a lax mesentery in the anterior right upper abdomen. No large bowel inflammation. Normal appendix on series 5, image 153. Negative terminal ileum. No dilated small bowel. Negative stomach and duodenum. No free fluid, free air. Lymphatic: Mild retrocrural lymphadenopathy associated with the abnormal lower thoracic spine, see thoracic spine CT. Other lymph nodes are within normal limits. Reproductive: Negative. Other: No pelvic free fluid. Musculoskeletal: Lumbar segmentation appears normal and the lumbar spine appears intact. There is advanced lower lumbar disc, endplate, and facet degeneration. No acute osseous abnormality identified in the abdomen or pelvis. Review of the MIP images confirms the above findings. IMPRESSION: 1. Abnormal thoracic spine at the T10-T11 level, see dedicated thoracic spine CT reported separately. 2. Negative for aortic aneurysm or dissection. 3. No other acute or inflammatory process identified in the chest, abdomen, or pelvis. 4. Previous left lower lobectomy suspected.  Chronic lung disease. Electronically Signed   By: Genevie Ann M.D.   On: 10/08/2018 01:29    Labs:  CBC: Recent Labs    01/02/18 1157 06/26/18 1116 10/07/18 2150  WBC 8.1 9.3 10.4  HGB 15.5 14.4 13.4  HCT 44.4 41.2 41.2  PLT 250 209 271    COAGS: No results for input(s): INR, APTT in the last 8760 hours.  BMP: Recent Labs    01/02/18 1157 06/26/18 1116 10/07/18 2150  NA 138 137 137  K 4.7 4.0 4.3  CL 104 106 104  CO2 25 22 25   GLUCOSE 100* 103* 99  BUN 9 11 13   CALCIUM 9.6 9.3 9.2  CREATININE 0.80 0.81 0.78  GFRNONAA 100 99 >60  GFRAA 116 114 >60    LIVER FUNCTION TESTS: Recent Labs    01/02/18 1157 06/26/18 1116 10/07/18 2150  BILITOT 0.3 0.4 0.4  AST 29 20 18   ALT 25 19 18   ALKPHOS  --   --  84  PROT 7.0 6.9 7.5  ALBUMIN  --   --  3.4*    Assessment and Plan: Pt with past medical history  significant for hypertension, hyperlipidemia, nephrolithiasis, prediabetes, tobacco/alcohol abuse,hepatitis C, as well as polytrauma years ago and subsequent chronic pain syndrome who presented to [facility] on 5/17 with worsening back pain as well as left-sided rib pain and mild temperature elevation.  Subsequent imaging revealed:  Severe marrow edema and enhancement in the T10 and T11 vertebral bodies with fluid signal within the T10-11 disc space and to lesser extent T11-12 disc space most concerning for discitis-osteomyelitis at T10-11. Severe anterior paravertebral soft tissue edema and enhancement at T11-12 without a drainable component most consistent with a phlegmon. Mild epidural enhancement at T11-12 likely reflecting epidural inflammatory changes given the vertebral body abnormality without a drainable component to suggest an abscess.  He has a normal CBC and is COVID negative.  Request now received for CT-guided aspiration of T10-11 disc space for further evaluation.  Imaging studies have been reviewed by Dr. Vernard Gambles.  Details/risks of procedure, including not limited to, internal bleeding, infection, injury to adjacent structures discussed with patient with his understanding and consent.  Procedure scheduled for today.    Electronically Signed: D. Rowe Robert, PA-C 10/08/2018, 10:13 AM   I spent a total of 20 minutes  at the the patient's bedside AND on the patient's hospital floor or unit, greater than 50% of which was counseling/coordinating care for CT guided  T10-11 disc space aspiration    Patient ID: HOSIE SHARMAN, male   DOB: August 05, 1960, 58 y.o.   MRN: [id number]

## 2018-10-09 ENCOUNTER — Encounter: Payer: Self-pay | Admitting: Internal Medicine

## 2018-10-09 ENCOUNTER — Other Ambulatory Visit (HOSPITAL_COMMUNITY): Payer: Medicare Other

## 2018-10-09 ENCOUNTER — Inpatient Hospital Stay (HOSPITAL_COMMUNITY): Payer: Medicare Other

## 2018-10-09 DIAGNOSIS — I10 Essential (primary) hypertension: Secondary | ICD-10-CM

## 2018-10-09 DIAGNOSIS — B9562 Methicillin resistant Staphylococcus aureus infection as the cause of diseases classified elsewhere: Secondary | ICD-10-CM

## 2018-10-09 DIAGNOSIS — F17200 Nicotine dependence, unspecified, uncomplicated: Secondary | ICD-10-CM

## 2018-10-09 DIAGNOSIS — E782 Mixed hyperlipidemia: Secondary | ICD-10-CM

## 2018-10-09 DIAGNOSIS — R7881 Bacteremia: Secondary | ICD-10-CM

## 2018-10-09 HISTORY — DX: Methicillin resistant Staphylococcus aureus infection as the cause of diseases classified elsewhere: B95.62

## 2018-10-09 HISTORY — DX: Bacteremia: R78.81

## 2018-10-09 LAB — BLOOD CULTURE ID PANEL (REFLEXED)

## 2018-10-09 LAB — HIV ANTIBODY (ROUTINE TESTING W REFLEX)
HIV Screen 4th Generation wRfx: NONREACTIVE
HIV Screen 4th Generation wRfx: NONREACTIVE

## 2018-10-09 LAB — CBC
HCT: 41.3 % (ref 39.0–52.0)
Hemoglobin: 13.3 g/dL (ref 13.0–17.0)
MCH: 31.1 pg (ref 26.0–34.0)
MCHC: 32.2 g/dL (ref 30.0–36.0)
MCV: 96.5 fL (ref 80.0–100.0)
Platelets: 282 10*3/uL (ref 150–400)
RBC: 4.28 MIL/uL (ref 4.22–5.81)
RDW: 13 % (ref 11.5–15.5)
WBC: 12.5 10*3/uL — ABNORMAL HIGH (ref 4.0–10.5)
nRBC: 0 % (ref 0.0–0.2)

## 2018-10-09 LAB — BASIC METABOLIC PANEL
Anion gap: 11 (ref 5–15)
BUN: 18 mg/dL (ref 6–20)
CO2: 23 mmol/L (ref 22–32)
Calcium: 9.1 mg/dL (ref 8.9–10.3)
Chloride: 98 mmol/L (ref 98–111)
Creatinine, Ser: 0.88 mg/dL (ref 0.61–1.24)
GFR calc Af Amer: >60 mL/min (ref >60)
GFR calc non Af Amer: >60 mL/min (ref >60)
Glucose, Bld: 115 mg/dL — ABNORMAL HIGH (ref 70–99)
Potassium: 4.1 mmol/L (ref 3.5–5.1)
Sodium: 132 mmol/L — ABNORMAL LOW (ref 135–145)

## 2018-10-09 LAB — SEDIMENTATION RATE: Sed Rate: 59 mm/hr — ABNORMAL HIGH (ref 0–16)

## 2018-10-09 LAB — C-REACTIVE PROTEIN: CRP: 11.2 mg/dL — ABNORMAL HIGH (ref <1.0)

## 2018-10-09 MED ORDER — SORBITOL 70 % SOLN
30.0000 mL | Freq: Once | Status: AC
  Administered 2018-10-09: 30 mL via ORAL
  Filled 2018-10-09: qty 30

## 2018-10-09 MED ORDER — VANCOMYCIN HCL 10 G IV SOLR
1250.0000 mg | Freq: Two times a day (BID) | INTRAVENOUS | Status: DC
  Administered 2018-10-09 – 2018-10-11 (×4): 1250 mg via INTRAVENOUS
  Filled 2018-10-09 (×5): qty 1250

## 2018-10-09 MED ORDER — POLYETHYLENE GLYCOL 3350 17 G PO PACK
17.0000 g | PACK | Freq: Two times a day (BID) | ORAL | Status: DC
  Administered 2018-10-09 – 2018-10-15 (×13): 17 g via ORAL
  Filled 2018-10-09 (×13): qty 1

## 2018-10-09 MED ORDER — SENNOSIDES-DOCUSATE SODIUM 8.6-50 MG PO TABS
1.0000 | ORAL_TABLET | Freq: Two times a day (BID) | ORAL | Status: DC
  Administered 2018-10-09 – 2018-10-15 (×13): 1 via ORAL
  Filled 2018-10-09 (×13): qty 1

## 2018-10-09 MED ORDER — VANCOMYCIN HCL 10 G IV SOLR
2500.0000 mg | Freq: Once | INTRAVENOUS | Status: AC
  Administered 2018-10-09: 2500 mg via INTRAVENOUS
  Filled 2018-10-09: qty 2500
  Filled 2018-10-09: qty 500

## 2018-10-09 MED ORDER — BISACODYL 10 MG RE SUPP
10.0000 mg | Freq: Once | RECTAL | Status: AC
  Administered 2018-10-09: 16:00:00 10 mg via RECTAL
  Filled 2018-10-09: qty 1

## 2018-10-09 NOTE — Progress Notes (Signed)
Pharmacy Antibiotic Note  1. Howard Fox is a 58 y.o. 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  Pharmacy has been consulted for vancomycin dosing.  BCID 1 of 4 bottles growing MRSA. WBC 12.5, SCr 0.88. AF.   Plan: Vancomycin 2500 mg IV loading dose Vancomycin 1250 mg IV Q 12 hrs. Goal AUC 400-550. Expected AUC: 532 Cssmax 35.6 Cssmin 14.4 SCr used: 0.88 Vd 0.5 F/u renal function, WBC, temp, culture data F/u ID consult recs  Height: 5\' 11"  (180.3 cm) Weight: 240 lb (108.9 kg) IBW/kg (Calculated) : 75.3  Temp (24hrs), Avg:98.9 F (37.2 C), Min:98.3 F (36.8 C), Max:100.2 F (37.9 C)  Recent Labs  Lab 10/07/18 2150 10/09/18 0419  WBC 10.4 12.5*  CREATININE 0.78 0.88    Estimated Creatinine Clearance: 116.2 mL/min (by C-G formula based on SCr of 0.88 mg/dL).    Allergies  Allergen Reactions  . Codeine Itching  . Dilaudid [Hydromorphone Hcl] Itching    Antimicrobials this admission: 5/19 vanc>> Dose adjustments this admission:  Microbiology results: 5/18 abscess spine: rare GP cocci 5/18 BCx2:  BCID 1 of 4 bottles. 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  Eudelia Bunch, Pharm.D 10/09/2018 7:52 AM

## 2018-10-09 NOTE — Progress Notes (Signed)
Subjective: No new complaints   Antibiotics:  Anti-infectives (From admission, onward)   Start     Dose/Rate Route Frequency Ordered Stop   10/09/18 2200  vancomycin (VANCOCIN) 1,250 mg in sodium chloride 0.9 % 250 mL IVPB     1,250 mg 166.7 mL/hr over 90 Minutes Intravenous Every 12 hours 10/09/18 0803     10/09/18 0800  vancomycin (VANCOCIN) 2,500 mg in sodium chloride 0.9 % 500 mL IVPB     2,500 mg 250 mL/hr over 120 Minutes Intravenous  Once 10/09/18 0745     10/08/18 0845  vancomycin (VANCOCIN) IVPB 1000 mg/200 mL premix  Status:  Discontinued     1,000 mg 200 mL/hr over 60 Minutes Intravenous  Once 10/08/18 0835 10/08/18 0908      Medications: Scheduled Meds:  amitriptyline  150 mg Oral QHS   aspirin EC  325 mg Oral Daily   citalopram  40 mg Oral Daily   gabapentin  300 mg Oral TID   lisinopril  10 mg Oral Daily   And   hydrochlorothiazide  12.5 mg Oral Daily   nicotine  14 mg Transdermal Daily   polyethylene glycol  17 g Oral BID   rosuvastatin  40 mg Oral Daily   senna-docusate  1 tablet Oral BID   sorbitol  30 mL Oral Once   Continuous Infusions:  vancomycin     vancomycin 2,500 mg (10/09/18 0954)   PRN Meds:.ALPRAZolam, carisoprodol, morphine injection, Muscle Rub, oxyCODONE-acetaminophen **AND** oxyCODONE    Objective: Weight change:   Intake/Output Summary (Last 24 hours) at 10/09/2018 1017 Last data filed at 10/09/2018 0125 Gross per 24 hour  Intake 480 ml  Output 1150 ml  Net -670 ml   Blood pressure 112/67, pulse 79, temperature 98.3 F (36.8 C), temperature source Oral, resp. rate 18, height 5\' 11"  (1.803 m), weight 108.9 kg, SpO2 93 %. Temp:  [98.3 F (36.8 C)-100.2 F (37.9 C)] 98.3 F (36.8 C) (05/19 0548) Pulse Rate:  [72-92] 79 (05/19 0548) Resp:  [15-18] 18 (05/19 0548) BP: (112-140)/(67-100) 112/67 (05/19 0548) SpO2:  [93 %-99 %] 93 % (05/19 0548)  Physical Exam: General: Alert and awake, oriented x3, not  in any acute distress. HEENT: anicteric sclera, EOMI CVS regular rate, normal  No mgr Chest: , no wheezing, no respiratory distress ctab Abdomen: soft non-distended,  Extremities: no edema or deformity noted bilaterally Skin: no rashes Neuro: nonfocal  CBC:    BMET Recent Labs    10/07/18 2150 10/09/18 0419  NA 137 132*  K 4.3 4.1  CL 104 98  CO2 25 23  GLUCOSE 99 115*  BUN 13 18  CREATININE 0.78 0.88  CALCIUM 9.2 9.1     Liver Panel  Recent Labs    10/07/18 2150  PROT 7.5  ALBUMIN 3.4*  AST 18  ALT 18  ALKPHOS 84  BILITOT 0.4       Sedimentation Rate Recent Labs    10/09/18 0419  ESRSEDRATE 59*   C-Reactive Protein Recent Labs    10/07/18 2150 10/09/18 0419  CRP 7.2* 11.2*    Micro Results: Recent Results (from the past 720 hour(s))  SARS Coronavirus 2 (CEPHEID - Performed in [facility] hospital lab), Hosp Order     Status: None   Collection Time: 10/07/18 10:28 PM  Result Value Ref Range Status   SARS Coronavirus 2 NEGATIVE NEGATIVE Final    Comment: (NOTE) If result is NEGATIVE SARS-CoV-2 target nucleic  acids are NOT DETECTED. The SARS-CoV-2 RNA is generally detectable in upper and lower  respiratory specimens during the acute phase of infection. The lowest  concentration of SARS-CoV-2 viral copies this assay can detect is 250  copies / mL. A negative result does not preclude SARS-CoV-2 infection  and should not be used as the sole basis for treatment or other  patient management decisions.  A negative result may occur with  improper specimen collection / handling, submission of specimen other  than nasopharyngeal swab, presence of viral mutation(s) within the  areas targeted by this assay, and inadequate number of viral copies  (<250 copies / mL). A negative result must be combined with clinical  observations, patient history, and epidemiological information. If result is POSITIVE SARS-CoV-2 target nucleic acids are DETECTED. The  SARS-CoV-2 RNA is generally detectable in upper and lower  respiratory specimens dur ing the acute phase of infection.  Positive  results are indicative of active infection with SARS-CoV-2.  Clinical  correlation with patient history and other diagnostic information is  necessary to determine patient infection status.  Positive results do  not rule out bacterial infection or co-infection with other viruses. If result is PRESUMPTIVE POSTIVE SARS-CoV-2 nucleic acids MAY BE PRESENT.   A presumptive positive result was obtained on the submitted specimen  and confirmed on repeat testing.  While 2019 novel coronavirus  (SARS-CoV-2) nucleic acids may be present in the submitted sample  additional confirmatory testing may be necessary for epidemiological  and / or clinical management purposes  to differentiate between  SARS-CoV-2 and other Sarbecovirus currently known to infect humans.  If clinically indicated additional testing with an alternate test  methodology [phone]) is advised. The SARS-CoV-2 RNA is generally  detectable in upper and lower respiratory sp ecimens during the acute  phase of infection. The expected result is Negative. Fact Sheet for Patients:  StrictlyIdeas.no Fact Sheet for Healthcare Providers: BankingDealers.co.za This test is not yet approved or cleared by the [place] FDA and has been authorized for detection and/or diagnosis of SARS-CoV-2 by FDA under an Emergency Use Authorization (EUA).  This EUA will remain in effect (meaning this test can be used) for the duration of the COVID-19 declaration under Section 564(b)(1) of the Act, 21 U.S.C. section 360bbb-3(b)(1), unless the authorization is terminated or revoked sooner. Performed at [facility], [place] [address] [place], [place] [address]   Culture, blood (Routine X 2) w Reflex to ID Panel     Status: None (Preliminary result)   Collection  Time: 10/08/18  8:54 AM  Result Value Ref Range Status   Specimen Description   Final    LEFT ANTECUBITAL Performed at [facility] [address], [place], [place] [address]    Special Requests   Final    BOTTLES DRAWN AEROBIC AND ANAEROBIC Blood Culture adequate volume Performed at [facility] [address] [place], [place] [address]    Culture  Setup Time   Final    GRAM POSITIVE COCCI AEROBIC BOTTLE ONLY CRITICAL RESULT CALLED TO, READ BACK BY AND VERIFIED WITH: M. RENZ PHARMD, AT 0706 10/09/18 BY Rush Landmark Performed at [facility], [place] [address] [place],  [address]    Culture [facility] POSITIVE COCCI  Final   Report Status PENDING  Incomplete  Blood Culture ID Panel (Reflexed)     Status: Abnormal   Collection Time: 10/08/18  8:54 AM  Result Value Ref Range Status  Enterococcus species NOT DETECTED NOT DETECTED Final   Listeria monocytogenes NOT DETECTED NOT DETECTED Final   Staphylococcus species DETECTED (A) NOT DETECTED Final    Comment: CRITICAL RESULT CALLED TO, READ BACK BY AND VERIFIED WITH: M. RENZ PHARMD, AT 0706 10/09/18 BY D. VANHOOK    Staphylococcus aureus (BCID) DETECTED (A) NOT DETECTED Final    Comment: Methicillin (oxacillin)-resistant Staphylococcus aureus (MRSA). MRSA is predictably resistant to beta-lactam antibiotics (except ceftaroline). Preferred therapy is vancomycin unless clinically contraindicated. Patient requires contact precautions if  hospitalized. CRITICAL RESULT CALLED TO, READ BACK BY AND VERIFIED WITH: M. RENZ PHARMD, AT 1610 10/09/18 BY D. VANHOOK    Methicillin resistance DETECTED (A) NOT DETECTED Final    Comment: CRITICAL RESULT CALLED TO, READ BACK BY AND VERIFIED WITH: M. RENZ PHARMD, AT 0706 10/09/18 BY D. VANHOOK    Streptococcus species NOT DETECTED NOT DETECTED Final   Streptococcus agalactiae NOT DETECTED NOT DETECTED Final   Streptococcus pneumoniae NOT DETECTED NOT DETECTED  Final   Streptococcus pyogenes NOT DETECTED NOT DETECTED Final   Acinetobacter baumannii NOT DETECTED NOT DETECTED Final   Enterobacteriaceae species NOT DETECTED NOT DETECTED Final   Enterobacter cloacae complex NOT DETECTED NOT DETECTED Final   Escherichia coli NOT DETECTED NOT DETECTED Final   Klebsiella oxytoca NOT DETECTED NOT DETECTED Final   Klebsiella pneumoniae NOT DETECTED NOT DETECTED Final   Proteus species NOT DETECTED NOT DETECTED Final   Serratia marcescens NOT DETECTED NOT DETECTED Final   Haemophilus influenzae NOT DETECTED NOT DETECTED Final   Neisseria meningitidis NOT DETECTED NOT DETECTED Final   Pseudomonas aeruginosa NOT DETECTED NOT DETECTED Final   Candida albicans NOT DETECTED NOT DETECTED Final   Candida glabrata NOT DETECTED NOT DETECTED Final   Candida krusei NOT DETECTED NOT DETECTED Final   Candida parapsilosis NOT DETECTED NOT DETECTED Final   Candida tropicalis NOT DETECTED NOT DETECTED Final    Comment: Performed at [facility], [place] [address], [place], [place] [address]  Culture, blood (Routine X 2) w Reflex to ID Panel     Status: None (Preliminary result)   Collection Time: 10/08/18  8:59 AM  Result Value Ref Range Status   Specimen Description   Final    RIGHT ANTECUBITAL Performed at [facility] [address] [place], [place] [address]    Special Requests   Final    BOTTLES DRAWN AEROBIC AND ANAEROBIC Blood Culture adequate volume Performed at [facility] [address], [place], [place] [address]    Culture   Final    NO GROWTH < 24 HOURS Performed at [facility] [address] [place], [place] [address]    Report Status PENDING  Incomplete  Aerobic/Anaerobic Culture (surgical/deep wound)     Status: None (Preliminary result)   Collection Time: 10/08/18 12:33 PM  Result Value Ref Range Status   Specimen Description   Final    ABSCESS SPINE Performed at [facility] [address] [place], [place] [address]    Special Requests   Final    NONE Performed at [facility], [place] [address], [place], [place] [address]    Gram Stain   Final    ABUNDANT WBC PRESENT,BOTH PMN AND MONONUCLEAR RARE GRAM POSITIVE COCCI Performed at [facility], [place] [address] [place], [place] [address]    Culture PENDING  Incomplete   Report Status PENDING  Incomplete    Studies/Results: Mr  Thoracic Spine W Wo Contrast  Result Date: 10/08/2018 CLINICAL DATA:  Pt has suspected osteomyelitis T10-T11. Pt stated he hurt is back six weeks ago working in garden. No previous back surgeries. EXAM: MRI THORACIC WITHOUT AND WITH CONTRAST TECHNIQUE: Multiplanar and multiecho pulse sequences of the thoracic spine were obtained without and with intravenous contrast. CONTRAST:  10 mL Gadavist COMPARISON:  CT chest 10/08/2018 CT abdomen 09/11/2018 FINDINGS: MRI THORACIC SPINE FINDINGS Alignment:  Physiologic. Vertebrae: Severe marrow edema in the T10 and T11 vertebral bodies with fluid signal within the T10-11 disc space and to lesser extent T11-12 disc space. Severe marrow edema and enhancement of the T10 and T11 vertebral bodies. Severe anterior paravertebral soft tissue edema and enhancement at T11-12 without a drainable component most consistent with a phlegmon. Mild epidural enhancement at T11-12 likely reflecting epidural inflammatory changes given the vertebral body abnormality without a drainable component to suggest an abscess. No other aggressive osseous lesion.  No acute fracture. Cord:  Normal signal and morphology. Paraspinal and other soft tissues: Severe paravertebral inflammatory changes at T10-11 without a drainable component. Small right pleural effusion. Disc levels: Disc spaces: Degenerative disc disease with disc height loss at C5-6 and C6-7 with broad-based disc osteophyte complex is on the sagittal images. T1-T2: No disc protrusion, foraminal  stenosis or central canal stenosis. T2-T3: Mild broad-based disc bulge. No foraminal or central canal stenosis. T3-T4: Mild broad-based disc bulge. No foraminal or central canal stenosis. T4-T5: No disc protrusion, foraminal stenosis or central canal stenosis. T5-T6: No disc protrusion, foraminal stenosis or central canal stenosis. T6-T7: No disc protrusion, foraminal stenosis or central canal stenosis. T7-T8: No disc protrusion, foraminal stenosis or central canal stenosis. T8-T9: No disc protrusion, foraminal stenosis or central canal stenosis. T9-T10: Small shallow right paracentral disc protrusion. No foraminal or central canal stenosis. T10-T11: No disc protrusion. Epidural inflammatory changes. Moderate bilateral facet arthropathy. Moderate right and mild left foraminal stenosis. T11-T12: No significant disc protrusion. Mild bilateral facet arthropathy. No significant foraminal stenosis. IMPRESSION: 1. Severe marrow edema and enhancement in the T10 and T11 vertebral bodies with fluid signal within the T10-11 disc space and to lesser extent T11-12 disc space most concerning for discitis-osteomyelitis at T10-11. Severe anterior paravertebral soft tissue edema and enhancement at T11-12 without a drainable component most consistent with a phlegmon. Mild epidural enhancement at T11-12 likely reflecting epidural inflammatory changes given the vertebral body abnormality without a drainable component to suggest an abscess. Electronically Signed   By: Kathreen Devoid   On: 10/08/2018 08:24   Ct Aspiration  Result Date: 10/08/2018 CLINICAL DATA:  Thoracic T10-T11 discitis/osteomyelitis with paraspinal phlegmon. EXAM: CT GUIDED ASPIRATION BIOPSY OF THORACIC PARASPINAL PHLEGMON ANESTHESIA/SEDATION: Intravenous Fentanyl 170mcg and Versed 4mg  were administered as conscious sedation during continuous monitoring of the patient's level of consciousness and physiological / cardiorespiratory status by the radiology RN, with a  total moderate sedation time of 13 minutes. PROCEDURE: The procedure risks, benefits, and alternatives were explained to the patient. Questions regarding the procedure were encouraged and answered. The patient understands and consents to the procedure. Patient placed prone. Select axial scans through the lower thoracic spine obtained. The T10-11 level was localized and an appropriate skin entry site to the right paraspinal phlegmon was identified and marked. The operative field was prepped with chlorhexidinein a sterile fashion, and a sterile drape was applied covering the operative field. A sterile gown and sterile gloves were used for the procedure. Local anesthesia was provided with 1% Lidocaine. Under CT  fluoroscopic guidance, a 18 gauge trocar needle was advanced into the right paraspinal phlegmon. Aspiration returned less than 2 mL bloody fluid, submitted for Gram stain and culture. The guide needle was removed. Postprocedure scans show no hemorrhage or other apparent complication. The patient tolerated the procedure well. COMPLICATIONS: None immediate FINDINGS: The disc changes at T10-T11 consistent with discitis/osteomyelitis were again identified. The paraspinal phlegmon was targeted and CT-guided aspiration sample obtained as above. Gram stain and culture pending. IMPRESSION: 1. Technically successful CT-guided aspiration of right paraspinal phlegmon T10-11. Electronically Signed   By: Lucrezia Europe M.D.   On: 10/08/2018 12:59   Ct T-spine No Charge  Result Date: 10/08/2018 CLINICAL DATA:  58 year old male with chest pain radiating to the back. EXAM: CT THORACIC SPINE WITH CONTRAST TECHNIQUE: Multiplanar CT images of the thoracic spine were reconstructed from contemporary CTA of the Chest. CONTRAST:  No additional. COMPARISON:  CTA chest, Abdomen, and Pelvis today are reported separately. Noncontrast CT Abdomen and Pelvis 09/11/2018. FINDINGS: Segmentation: Normal. Alignment: Overall preserved thoracic  kyphosis. Mild levoconvex scoliosis. Vertebrae: Partial destruction of the anterior vertebral bodies at T10-T11 (series 20, image 37) associated with regional paraspinal soft tissue thickening and hyper enhancement suspicious for discitis osteomyelitis with inflammatory phlegmon. The ventral and right paraspinal soft tissues are primarily affected, relatively sparing the periaortic soft tissues. There is mild associated retrocrural lymphadenopathy with 8 millimeter short axis nodes. No discrete fluid collection. The posterior T10 and T11 vertebral bodies and posterior elements appear to remain intact. There is degenerative appearing right T10 neural foraminal stenosis. Other thoracic vertebrae appear intact. The posterior right ribs appear intact. There are chronic postoperative changes to the left posterior ribs with cerclage wires. Paraspinal and other soft tissues: Abnormal at the right T10-T11 level. Other chest and abdominal visceral findings are reported separately today. Disc levels: Degenerative mild spinal stenosis suspected at T10-T11. IMPRESSION: 1. 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 Follow-up Thoracic Spine MRI without and with contrast may be valuable. 2. No other acute process identified in the thoracic spine. Superimposed degenerative spinal and right foraminal stenosis suspected at T10-T11. 3. CTA chest, Abdomen, and Pelvis today are reported separately. Electronically Signed   By: Genevie Ann M.D.   On: 10/08/2018 01:36   Ct Angio Chest/abd/pel For Dissection W And/or Wo Contrast  Result Date: 10/08/2018 CLINICAL DATA:  58 year old male with chest pain radiating to the back. EXAM: CT ANGIOGRAPHY CHEST, ABDOMEN AND PELVIS TECHNIQUE: Multidetector CT imaging through the chest, abdomen and pelvis was performed using the standard protocol during bolus administration of intravenous contrast. Multiplanar reconstructed  images and MIPs were obtained and reviewed to evaluate the vascular anatomy. CONTRAST:  168mL OMNIPAQUE IOHEXOL 350 MG/ML SOLN COMPARISON:  Noncontrast CT Abdomen and Pelvis 09/11/2018. Cardiac CTA 11/14/2008. FINDINGS: CTA CHEST FINDINGS Cardiovascular: Chronic surgical clips along the left mediastinum situated between the left pulmonary artery bifurcation and descending aorta. No calcified coronary artery atherosclerosis is evident. No thoracic aortic aneurysm or dissection. Minimal thoracic aortic atherosclerosis. Normal patency of the central pulmonary arteries. Stable cardiac size since 2010.  No pericardial effusion. Mediastinum/Nodes: Negative.  No lymphadenopathy. Lungs/Pleura: Major airways are patent. Left lower lobectomy suspected. There is chronic subpleural scarring and reticular opacity in both lungs. There is a trace layering right pleural effusion with new enhancing right costophrenic angle atelectasis. Musculoskeletal: Thoracic spine CT reformatted from these images is reported separately today (please see that report). Chronic left posterior rib  resections. Review of the MIP images confirms the above findings. CTA ABDOMEN AND PELVIS FINDINGS VASCULAR Major arterial structures are patent. There is iliofemoral atherosclerosis. Review of the MIP images confirms the above findings. NON-VASCULAR Hepatobiliary: Surgically absent gallbladder.  Negative liver. Pancreas: Negative. Spleen: Negative. Adrenals/Urinary Tract: Normal adrenal glands. The kidneys enhance symmetrically and appear negative. Proximal ureters are decompressed. Mildly distended but otherwise unremarkable urinary bladder. Stomach/Bowel: Redundant large bowel with retained stool and gas distended sigmoid. The cecum is on a lax mesentery in the anterior right upper abdomen. No large bowel inflammation. Normal appendix on series 5, image 153. Negative terminal ileum. No dilated small bowel. Negative stomach and duodenum. No free fluid,  free air. Lymphatic: Mild retrocrural lymphadenopathy associated with the abnormal lower thoracic spine, see thoracic spine CT. Other lymph nodes are within normal limits. Reproductive: Negative. Other: No pelvic free fluid. Musculoskeletal: Lumbar segmentation appears normal and the lumbar spine appears intact. There is advanced lower lumbar disc, endplate, and facet degeneration. No acute osseous abnormality identified in the abdomen or pelvis. Review of the MIP images confirms the above findings. IMPRESSION: 1. Abnormal thoracic spine at the T10-T11 level, see dedicated thoracic spine CT reported separately. 2. Negative for aortic aneurysm or dissection. 3. No other acute or inflammatory process identified in the chest, abdomen, or pelvis. 4. Previous left lower lobectomy suspected.  Chronic lung disease. Electronically Signed   By: Genevie Ann M.D.   On: 10/08/2018 01:29      Assessment/Plan:  INTERVAL HISTORY:   Blood culture + MRSA, vancomycin started    Principal Problem:   Discitis Active Problems:   Essential hypertension   Hyperlipidemia, mixed   Hepatitis C, chronic (HCC)   Tobacco use disorder   Prediabetes   Bacteremia due to Gram-positive bacteria    OREY MOURE is a 58 y.o. male with  T 10-11 diskitis, osteomyelitis and now found to have MRSA bacteremia. He has Chronic HCV without hepatic coma and admitted to me prior injection drug use though claims he has been clean since 2008  #1      [facility] Staphylococcus aureus bacteremia   Staphylococcus aureus bacteremia (SAB) is associated with a high rate of complications and mortality.  Specific aspects of clinical management are critical to optimizing the outcome of patients with SAB.  Therefore, the [facility] Antimicrobial Management Team [facility]) has initiated an intervention aimed at improving the management of SAB at [facility].  To do so, Infectious Diseases physicians are providing an  evidence-based consult for the management of all patients with SAB.     Yes No Comments  Perform follow-up blood cultures (even if the patient is afebrile) to ensure clearance of bacteremia [x]  []  Repeat tomorrow  Remove vascular catheter and obtain follow-up blood cultures after the removal of the catheter []  []  DO NOT PLACE PICC until we have cultures no growth and final  Perform echocardiography to evaluate for endocarditis (transthoracic ECHO is 40-50% sensitive, TEE is > 90% sensitive) [x]  []  Please keep in mind, that neither test can definitively EXCLUDE endocarditis, and that should clinical suspicion remain high for endocarditis the patient should then still be treated with an "endocarditis" duration of therapy = 6 weeks  Consult electrophysiologist to evaluate implanted cardiac device (pacemaker, ICD) []  []  NA  Ensure source control []  []  Have all abscesses been drained effectively? Have deep seeded infections (septic joints or osteomyelitis) had appropriate surgical debridement?  ? IVDU as source. Spine infected  Investigate for metastatic sites of infection []  []  Does the patient have ANY symptom or physical exam finding that would suggest a deeper infection (back or neck pain that may be suggestive of vertebral osteomyelitis or epidural abscess, muscle pain that could be a symptom of pyomyositis)?  Keep in mind that for deep seeded infections MRI imaging with contrast is preferred rather than other often insensitive tests such as plain x-rays, especially early in a patient's presentation. Spine is only clear site  Change antibiotic therapy to vancomycin []  []  Beta-lactam antibiotics are preferred for MSSA due to higher cure rates.   If on Vancomycin, goal trough should be 15 - 20 mcg/mL  Estimated duration of IV antibiotic therapy:  8 weeks []  []  Consult case management for probably prolonged outpatient IV antibiotic therapy   #2 Vertebral osteomyelitis: duration as above. Tricky  items will be is he active IVDU and what about risk to his 26 year old mother if he goes home with home health  #3 Hep C: happy to treat as an outpatient    LOS: 1 day   Alcide Evener 10/09/2018, 10:17 AM

## 2018-10-09 NOTE — Progress Notes (Signed)
PROGRESS NOTE    Howard Fox  ION:[id number] DOB: 1960-11-20 DOA: 10/07/2018 PCP: Unk Pinto, MD    Brief Narrative:  HPI per Dr. Terance Hart is a 58 y.o. male with medical history significant of hypertension, hyperlipidemia, prediabetes, polytrauma years ago, chronic pain syndrome who presents to the emergency department with complaints of worsening back pain ,left-sided rib pain and mild grade fever.  Patient gives history of fall about 27 years ago and at that time he had polytrauma injuring his intestines, chest.  He underwent exploratory laparotomy, chest wall resection at the time.  Patient has issues with back pain since then.  He takes pain medications for his back pain.  He follows with orthopedics.  Patient is physically active, no problem with ambulation, works at his garden very often at home.  Since last few weeks he gave report that his back pain is getting worse.  He followed with his orthopedics few days ago and MRI was scheduled as an outpatient.  Yesterday, patient started having severe back pain and rib pain on the left side.  He also had fever of 100.2 F at home.  He then presented to the emergency department . Patient seen and examined the bedside in the emergency department.  Currently he is hemodynamically stable, comfortable.  He denies any chest pain, shortness of breath, palpitations, abdominal pain, dysuria, nausea, vomiting, headache, diarrhea, hematochezia or melena.  He denies any contacts with COVID-19.  ED Course: MRI, CT imagings done in the emergency department showed discitis/osteomyelitis at T10-T12 with right paraspinal phlegmon.  Blood cultures obtained.  IR/ID consulted.  Covid-19 found to be negative.   Assessment & Plan:   Principal Problem:   Discitis Active Problems:   Bacteremia due to Gram-positive bacteria   Essential hypertension   Hyperlipidemia, mixed   Hepatitis C, chronic (HCC)   Tobacco use disorder    Prediabetes   1 staph aureus bacteremia Questionable etiology.  Blood cultures done concerning for MRSA bacteremia.  Patient denies any recent IV drug use.  2D echo ordered and is pending.  Patient started on IV vancomycin.  ID following and appreciate input and recommendations.  2.  Discitis/osteomyelitis Status post CT-guided aspiration biopsy of thoracic paraspinal phlegmon per interventional radiology, Dr. Vernard Gambles 10/08/2018.  Cultures pending.  Patient started on IV vancomycin.  ID following and appreciate input and recommendations.  3.  History of hepatitis C Outpatient follow-up with ID.  4.  Hyperlipidemia Continue statin.  5.  Tobacco abuse Tobacco cessation.  Continue nicotine patch.  6.  Chronic pain syndrome/chronic back pain/anxiety Follows with orthopedics in the outpatient setting.  Continue home regimen pain medication.  7.  Hypertension Continue home regimen lisinopril HCTZ.  8.  Constipation Patient complains of constipation.  Patient with complaints of some left-sided abdominal discomfort and concerned he may have a hernia.  CT abdomen and pelvis done showing redundant large bowel with retained stool and gas distended sigmoid.  Cecum on lax mesentery and anterior right upper abdomen.  Will get abdominal films patient complaining of significant abdominal pain this afternoon.  Continue MiraLAX twice daily.  Continue Senokot-S twice daily.  Will give a Dulcolax suppository.  If no results will need a soapsuds enema.   DVT prophylaxis: SCDs Code Status: Full Family Communication: Updated patient.  No family at bedside. Disposition Plan: To be determined.   Consultants:   Infectious diseases: Dr.Van Dam 10/08/2018  Procedures:   CT angiogram chest abdomen and pelvis 10/08/2018  CT-guided aspiration  biopsy of thoracic paraspinal phlegmon per Dr. Vernard Gambles 10/08/2018  Antimicrobials:   IV vancomycin 10/09/2018   Subjective: Patient sitting up in bed.  Patient  complain of constipation.  Patient denies any recent IV drug use.  Denies any nausea or vomiting.  No chest pain.  No shortness of breath.  Still with complaints of back pain.  Concerned he may have an abdominal hernia.  Patient concerned about his mother who he stays with and takes care of her.  Objective: Vitals:   10/08/18 1311 10/08/18 1340 10/08/18 2128 10/09/18 0548  BP: 121/79 117/69 (!) 139/98 112/67  Pulse: 74 75 92 79  Resp: 18 17 18 18   Temp: 98.9 F (37.2 C) 98.7 F (37.1 C) 100.2 F (37.9 C) 98.3 F (36.8 C)  TempSrc: Oral Oral Oral Oral  SpO2: 96% 93% 99% 93%  Weight:      Height:        Intake/Output Summary (Last 24 hours) at 10/09/2018 1303 Last data filed at 10/09/2018 1040 Gross per 24 hour  Intake 840 ml  Output 1925 ml  Net -1085 ml   Filed Weights   10/07/18 2130  Weight: 108.9 kg    Examination:  General exam: Appears calm and comfortable  Respiratory system: Clear to auscultation. Respiratory effort normal. Cardiovascular system: S1 & S2 heard, RRR. No JVD, murmurs, rubs, gallops or clicks. No pedal edema. Gastrointestinal system: Abdomen is distended, soft, some abdominal discomfort in the left mid to lower quadrant, positive bowel sounds.  No rebound.  No guarding.  Central nervous system: Alert and oriented. No focal neurological deficits. Extremities: Symmetric 5 x 5 power. Skin: No rashes, lesions or ulcers Psychiatry: Judgement and insight appear normal. Mood & affect appropriate.     Data Reviewed: I have personally reviewed following labs and imaging studies  CBC: Recent Labs  Lab 10/07/18 2150 10/09/18 0419  WBC 10.4 12.5*  NEUTROABS 7.2  --   HGB 13.4 13.3  HCT 41.2 41.3  MCV 95.8 96.5  PLT 271 967   Basic Metabolic Panel: Recent Labs  Lab 10/07/18 2150 10/09/18 0419  NA 137 132*  K 4.3 4.1  CL 104 98  CO2 25 23  GLUCOSE 99 115*  BUN 13 18  CREATININE 0.78 0.88  CALCIUM 9.2 9.1   GFR: Estimated Creatinine  Clearance: 116.2 mL/min (by C-G formula based on SCr of 0.88 mg/dL). Liver Function Tests: Recent Labs  Lab 10/07/18 2150  AST 18  ALT 18  ALKPHOS 84  BILITOT 0.4  PROT 7.5  ALBUMIN 3.4*   Recent Labs  Lab 10/07/18 2150  LIPASE 21   No results for input(s): AMMONIA in the last 168 hours. Coagulation Profile: Recent Labs  Lab 10/08/18 1057  INR 1.0   Cardiac Enzymes: Recent Labs  Lab 10/07/18 2150  TROPONINI <0.03   BNP (last 3 results) No results for input(s): PROBNP in the last 8760 hours. HbA1C: Recent Labs    10/08/18 0517  HGBA1C 5.7*   CBG: No results for input(s): GLUCAP in the last 168 hours. Lipid Profile: No results for input(s): CHOL, HDL, LDLCALC, TRIG, CHOLHDL, LDLDIRECT in the last 72 hours. Thyroid Function Tests: No results for input(s): TSH, T4TOTAL, FREET4, T3FREE, THYROIDAB in the last 72 hours. Anemia Panel: No results for input(s): VITAMINB12, FOLATE, FERRITIN, TIBC, IRON, RETICCTPCT in the last 72 hours. Sepsis Labs: No results for input(s): PROCALCITON, LATICACIDVEN in the last 168 hours.  Recent Results (from the past 240 hour(s))  SARS Coronavirus  2 (CEPHEID - Performed in [facility] lab), Hosp Order     Status: None   Collection Time: 10/07/18 10:28 PM  Result Value Ref Range Status   SARS Coronavirus 2 NEGATIVE NEGATIVE Final    Comment: (NOTE) If result is NEGATIVE SARS-CoV-2 target nucleic acids are NOT DETECTED. The SARS-CoV-2 RNA is generally detectable in upper and lower  respiratory specimens during the acute phase of infection. The lowest  concentration of SARS-CoV-2 viral copies this assay can detect is 250  copies / mL. A negative result does not preclude SARS-CoV-2 infection  and should not be used as the sole basis for treatment or other  patient management decisions.  A negative result may occur with  improper specimen collection / handling, submission of specimen other  than nasopharyngeal swab, presence  of viral mutation(s) within the  areas targeted by this assay, and inadequate number of viral copies  (<250 copies / mL). A negative result must be combined with clinical  observations, patient history, and epidemiological information. If result is POSITIVE SARS-CoV-2 target nucleic acids are DETECTED. The SARS-CoV-2 RNA is generally detectable in upper and lower  respiratory specimens dur ing the acute phase of infection.  Positive  results are indicative of active infection with SARS-CoV-2.  Clinical  correlation with patient history and other diagnostic information is  necessary to determine patient infection status.  Positive results do  not rule out bacterial infection or co-infection with other viruses. If result is PRESUMPTIVE POSTIVE SARS-CoV-2 nucleic acids MAY BE PRESENT.   A presumptive positive result was obtained on the submitted specimen  and confirmed on repeat testing.  While 2019 novel coronavirus  (SARS-CoV-2) nucleic acids may be present in the submitted sample  additional confirmatory testing may be necessary for epidemiological  and / or clinical management purposes  to differentiate between  SARS-CoV-2 and other Sarbecovirus currently known to infect humans.  If clinically indicated additional testing with an alternate test  methodology [id number]) is advised. The SARS-CoV-2 RNA is generally  detectable in upper and lower respiratory sp ecimens during the acute  phase of infection. The expected result is Negative. Fact Sheet for Patients:  StrictlyIdeas.no Fact Sheet for Healthcare Providers: BankingDealers.co.za This test is not yet approved or cleared by the [place] FDA and has been authorized for detection and/or diagnosis of SARS-CoV-2 by FDA under an Emergency Use Authorization (EUA).  This EUA will remain in effect (meaning this test can be used) for the duration of the COVID-19 declaration under Section  564(b)(1) of the Act, 21 U.S.C. section 360bbb-3(b)(1), unless the authorization is terminated or revoked sooner. Performed at [facility], [place] [address], [place], [place] [address]   Culture, blood (Routine X 2) w Reflex to ID Panel     Status: None (Preliminary result)   Collection Time: 10/08/18  8:54 AM  Result Value Ref Range Status   Specimen Description   Final    LEFT ANTECUBITAL Performed at [facility] [address] [place], [place] [address]    Special Requests   Final    BOTTLES DRAWN AEROBIC AND ANAEROBIC Blood Culture adequate volume Performed at [facility] [address] [place], [place] [address]    Culture  Setup Time   Final    GRAM POSITIVE COCCI AEROBIC BOTTLE ONLY CRITICAL RESULT CALLED TO, READ BACK BY AND VERIFIED WITH: M. RENZ PHARMD, AT 7628 10/09/18 BY Rush [place] Performed at [facility], [address] [place], [place] [address]    Culture GRAM POSITIVE COCCI  Final   Report Status PENDING  Incomplete  Blood Culture ID Panel (Reflexed)     Status: Abnormal   Collection Time: 10/08/18  8:54 AM  Result Value Ref Range Status   Enterococcus species NOT DETECTED NOT DETECTED Final   Listeria monocytogenes NOT DETECTED NOT DETECTED Final   Staphylococcus species DETECTED (A) NOT DETECTED Final    Comment: CRITICAL RESULT CALLED TO, READ BACK BY AND VERIFIED WITH: M. RENZ PHARMD, AT 0706 10/09/18 BY D. VANHOOK    Staphylococcus aureus (BCID) DETECTED (A) NOT DETECTED Final    Comment: Methicillin (oxacillin)-resistant Staphylococcus aureus (MRSA). MRSA is predictably resistant to beta-lactam antibiotics (except ceftaroline). Preferred therapy is vancomycin unless clinically contraindicated. Patient requires contact precautions if  hospitalized. CRITICAL RESULT CALLED TO, READ BACK BY AND VERIFIED WITH: M. RENZ PHARMD, AT 4562 10/09/18 BY D. VANHOOK    Methicillin resistance DETECTED  (A) NOT DETECTED Final    Comment: CRITICAL RESULT CALLED TO, READ BACK BY AND VERIFIED WITH: M. RENZ PHARMD, AT 0706 10/09/18 BY D. VANHOOK    Streptococcus species NOT DETECTED NOT DETECTED Final   Streptococcus agalactiae NOT DETECTED NOT DETECTED Final   Streptococcus pneumoniae NOT DETECTED NOT DETECTED Final   Streptococcus pyogenes NOT DETECTED NOT DETECTED Final   Acinetobacter baumannii NOT DETECTED NOT DETECTED Final   Enterobacteriaceae species NOT DETECTED NOT DETECTED Final   Enterobacter cloacae complex NOT DETECTED NOT DETECTED Final   Escherichia coli NOT DETECTED NOT DETECTED Final   Klebsiella oxytoca NOT DETECTED NOT DETECTED Final   Klebsiella pneumoniae NOT DETECTED NOT DETECTED Final   Proteus species NOT DETECTED NOT DETECTED Final   Serratia marcescens NOT DETECTED NOT DETECTED Final   Haemophilus influenzae NOT DETECTED NOT DETECTED Final   Neisseria meningitidis NOT DETECTED NOT DETECTED Final   Pseudomonas aeruginosa NOT DETECTED NOT DETECTED Final   Candida albicans NOT DETECTED NOT DETECTED Final   Candida glabrata NOT DETECTED NOT DETECTED Final   Candida krusei NOT DETECTED NOT DETECTED Final   Candida parapsilosis NOT DETECTED NOT DETECTED Final   Candida tropicalis NOT DETECTED NOT DETECTED Final    Comment: Performed at [facility], [place] [address] [place], [place] [address]  Culture, blood (Routine X 2) w Reflex to ID Panel     Status: None (Preliminary result)   Collection Time: 10/08/18  8:59 AM  Result Value Ref Range Status   Specimen Description   Final    RIGHT ANTECUBITAL Performed at [facility] [address] [place], [place] [address]    Special Requests   Final    BOTTLES DRAWN AEROBIC AND ANAEROBIC Blood Culture adequate volume Performed at [facility] [address], [place], [place] [address]    Culture   Final    NO GROWTH < 24 HOURS Performed at [facility]  [address], [place], [place] [address]    Report Status PENDING  Incomplete  Aerobic/Anaerobic Culture (surgical/deep wound)     Status: None (Preliminary result)   Collection Time: 10/08/18 12:33 PM  Result Value Ref Range Status   Specimen Description   Final    ABSCESS SPINE Performed at [facility] [address] [place], [place] [address]    Special Requests   Final    NONE Performed at [facility], [place] [address] [place],  [address]  Gram Stain   Final    ABUNDANT WBC PRESENT,BOTH PMN AND MONONUCLEAR RARE GRAM POSITIVE COCCI Performed at [facility], [place] [address] [place], [place] [address]    Culture FEW STAPHYLOCOCCUS AUREUS  Final   Report Status PENDING  Incomplete         Radiology Studies: Mr Thoracic Spine W Wo Contrast  Result Date: 10/08/2018 CLINICAL DATA:  Pt has suspected osteomyelitis T10-T11. Pt stated he hurt is back six weeks ago working in garden. No previous back surgeries. EXAM: MRI THORACIC WITHOUT AND WITH CONTRAST TECHNIQUE: Multiplanar and multiecho pulse sequences of the thoracic spine were obtained without and with intravenous contrast. CONTRAST:  10 mL Gadavist COMPARISON:  CT chest 10/08/2018 CT abdomen 09/11/2018 FINDINGS: MRI THORACIC SPINE FINDINGS Alignment:  Physiologic. Vertebrae: Severe marrow edema in the T10 and T11 vertebral bodies with fluid signal within the T10-11 disc space and to lesser extent T11-12 disc space. Severe marrow edema and enhancement of the T10 and T11 vertebral bodies. Severe anterior paravertebral soft tissue edema and enhancement at T11-12 without a drainable component most consistent with a phlegmon. Mild epidural enhancement at T11-12 likely reflecting epidural inflammatory changes given the vertebral body abnormality without a drainable component to suggest an abscess. No other aggressive osseous lesion.  No acute fracture. Cord:  Normal signal and morphology.  Paraspinal and other soft tissues: Severe paravertebral inflammatory changes at T10-11 without a drainable component. Small right pleural effusion. Disc levels: Disc spaces: Degenerative disc disease with disc height loss at C5-6 and C6-7 with broad-based disc osteophyte complex is on the sagittal images. T1-T2: No disc protrusion, foraminal stenosis or central canal stenosis. T2-T3: Mild broad-based disc bulge. No foraminal or central canal stenosis. T3-T4: Mild broad-based disc bulge. No foraminal or central canal stenosis. T4-T5: No disc protrusion, foraminal stenosis or central canal stenosis. T5-T6: No disc protrusion, foraminal stenosis or central canal stenosis. T6-T7: No disc protrusion, foraminal stenosis or central canal stenosis. T7-T8: No disc protrusion, foraminal stenosis or central canal stenosis. T8-T9: No disc protrusion, foraminal stenosis or central canal stenosis. T9-T10: Small shallow right paracentral disc protrusion. No foraminal or central canal stenosis. T10-T11: No disc protrusion. Epidural inflammatory changes. Moderate bilateral facet arthropathy. Moderate right and mild left foraminal stenosis. T11-T12: No significant disc protrusion. Mild bilateral facet arthropathy. No significant foraminal stenosis. IMPRESSION: 1. Severe marrow edema and enhancement in the T10 and T11 vertebral bodies with fluid signal within the T10-11 disc space and to lesser extent T11-12 disc space most concerning for discitis-osteomyelitis at T10-11. Severe anterior paravertebral soft tissue edema and enhancement at T11-12 without a drainable component most consistent with a phlegmon. Mild epidural enhancement at T11-12 likely reflecting epidural inflammatory changes given the vertebral body abnormality without a drainable component to suggest an abscess. Electronically Signed   By: Kathreen Devoid   On: 10/08/2018 08:24   Ct Aspiration  Result Date: 10/08/2018 CLINICAL DATA:  Thoracic T10-T11  discitis/osteomyelitis with paraspinal phlegmon. EXAM: CT GUIDED ASPIRATION BIOPSY OF THORACIC PARASPINAL PHLEGMON ANESTHESIA/SEDATION: Intravenous Fentanyl 158mcg and Versed 4mg  were administered as conscious sedation during continuous monitoring of the patient's level of consciousness and physiological / cardiorespiratory status by the radiology RN, with a total moderate sedation time of 13 minutes. PROCEDURE: The procedure risks, benefits, and alternatives were explained to the patient. Questions regarding the procedure were encouraged and answered. The patient understands and consents to the procedure. Patient placed prone. Select axial scans through the lower thoracic spine obtained. The T10-11  level was localized and an appropriate skin entry site to the right paraspinal phlegmon was identified and marked. The operative field was prepped with chlorhexidinein a sterile fashion, and a sterile drape was applied covering the operative field. A sterile gown and sterile gloves were used for the procedure. Local anesthesia was provided with 1% Lidocaine. Under CT fluoroscopic guidance, a 18 gauge trocar needle was advanced into the right paraspinal phlegmon. Aspiration returned less than 2 mL bloody fluid, submitted for Gram stain and culture. The guide needle was removed. Postprocedure scans show no hemorrhage or other apparent complication. The patient tolerated the procedure well. COMPLICATIONS: None immediate FINDINGS: The disc changes at T10-T11 consistent with discitis/osteomyelitis were again identified. The paraspinal phlegmon was targeted and CT-guided aspiration sample obtained as above. Gram stain and culture pending. IMPRESSION: 1. Technically successful CT-guided aspiration of right paraspinal phlegmon T10-11. Electronically Signed   By: Lucrezia Europe M.D.   On: 10/08/2018 12:59   Ct T-spine No Charge  Result Date: 10/08/2018 CLINICAL DATA:  58 year old male with chest pain radiating to the back. EXAM:  CT THORACIC SPINE WITH CONTRAST TECHNIQUE: Multiplanar CT images of the thoracic spine were reconstructed from contemporary CTA of the Chest. CONTRAST:  No additional. COMPARISON:  CTA chest, Abdomen, and Pelvis today are reported separately. Noncontrast CT Abdomen and Pelvis 09/11/2018. FINDINGS: Segmentation: Normal. Alignment: Overall preserved thoracic kyphosis. Mild levoconvex scoliosis. Vertebrae: Partial destruction of the anterior vertebral bodies at T10-T11 (series 20, image 37) associated with regional paraspinal soft tissue thickening and hyper enhancement suspicious for discitis osteomyelitis with inflammatory phlegmon. The ventral and right paraspinal soft tissues are primarily affected, relatively sparing the periaortic soft tissues. There is mild associated retrocrural lymphadenopathy with 8 millimeter short axis nodes. No discrete fluid collection. The posterior T10 and T11 vertebral bodies and posterior elements appear to remain intact. There is degenerative appearing right T10 neural foraminal stenosis. Other thoracic vertebrae appear intact. The posterior right ribs appear intact. There are chronic postoperative changes to the left posterior ribs with cerclage wires. Paraspinal and other soft tissues: Abnormal at the right T10-T11 level. Other chest and abdominal visceral findings are reported separately today. Disc levels: Degenerative mild spinal stenosis suspected at T10-T11. IMPRESSION: 1. 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 Follow-up Thoracic Spine MRI without and with contrast may be valuable. 2. No other acute process identified in the thoracic spine. Superimposed degenerative spinal and right foraminal stenosis suspected at T10-T11. 3. CTA chest, Abdomen, and Pelvis today are reported separately. Electronically Signed   By: Genevie Ann M.D.   On: 10/08/2018 01:36   Ct Angio Chest/abd/pel For Dissection W  And/or Wo Contrast  Result Date: 10/08/2018 CLINICAL DATA:  58 year old male with chest pain radiating to the back. EXAM: CT ANGIOGRAPHY CHEST, ABDOMEN AND PELVIS TECHNIQUE: Multidetector CT imaging through the chest, abdomen and pelvis was performed using the standard protocol during bolus administration of intravenous contrast. Multiplanar reconstructed images and MIPs were obtained and reviewed to evaluate the vascular anatomy. CONTRAST:  166mL OMNIPAQUE IOHEXOL 350 MG/ML SOLN COMPARISON:  Noncontrast CT Abdomen and Pelvis 09/11/2018. Cardiac CTA 11/14/2008. FINDINGS: CTA CHEST FINDINGS Cardiovascular: Chronic surgical clips along the left mediastinum situated between the left pulmonary artery bifurcation and descending aorta. No calcified coronary artery atherosclerosis is evident. No thoracic aortic aneurysm or dissection. Minimal thoracic aortic atherosclerosis. Normal patency of the central pulmonary arteries. Stable cardiac size since 2010.  No pericardial effusion. Mediastinum/Nodes: Negative.  No lymphadenopathy. Lungs/Pleura: Major airways are patent. Left lower lobectomy suspected. There is chronic subpleural scarring and reticular opacity in both lungs. There is a trace layering right pleural effusion with new enhancing right costophrenic angle atelectasis. Musculoskeletal: Thoracic spine CT reformatted from these images is reported separately today (please see that report). Chronic left posterior rib resections. Review of the MIP images confirms the above findings. CTA ABDOMEN AND PELVIS FINDINGS VASCULAR Major arterial structures are patent. There is iliofemoral atherosclerosis. Review of the MIP images confirms the above findings. NON-VASCULAR Hepatobiliary: Surgically absent gallbladder.  Negative liver. Pancreas: Negative. Spleen: Negative. Adrenals/Urinary Tract: Normal adrenal glands. The kidneys enhance symmetrically and appear negative. Proximal ureters are decompressed. Mildly distended but  otherwise unremarkable urinary bladder. Stomach/Bowel: Redundant large bowel with retained stool and gas distended sigmoid. The cecum is on a lax mesentery in the anterior right upper abdomen. No large bowel inflammation. Normal appendix on series 5, image 153. Negative terminal ileum. No dilated small bowel. Negative stomach and duodenum. No free fluid, free air. Lymphatic: Mild retrocrural lymphadenopathy associated with the abnormal lower thoracic spine, see thoracic spine CT. Other lymph nodes are within normal limits. Reproductive: Negative. Other: No pelvic free fluid. Musculoskeletal: Lumbar segmentation appears normal and the lumbar spine appears intact. There is advanced lower lumbar disc, endplate, and facet degeneration. No acute osseous abnormality identified in the abdomen or pelvis. Review of the MIP images confirms the above findings. IMPRESSION: 1. Abnormal thoracic spine at the T10-T11 level, see dedicated thoracic spine CT reported separately. 2. Negative for aortic aneurysm or dissection. 3. No other acute or inflammatory process identified in the chest, abdomen, or pelvis. 4. Previous left lower lobectomy suspected.  Chronic lung disease. Electronically Signed   By: Genevie Ann M.D.   On: 10/08/2018 01:29        Scheduled Meds:  amitriptyline  150 mg Oral QHS   aspirin EC  325 mg Oral Daily   citalopram  40 mg Oral Daily   gabapentin  300 mg Oral TID   lisinopril  10 mg Oral Daily   And   hydrochlorothiazide  12.5 mg Oral Daily   nicotine  14 mg Transdermal Daily   polyethylene glycol  17 g Oral BID   rosuvastatin  40 mg Oral Daily   senna-docusate  1 tablet Oral BID   Continuous Infusions:  vancomycin       LOS: 1 day    Time spent: 35 minutes    Irine Seal, MD [facility]  If 7PM-7AM, please contact night-coverage [email or web address] 10/09/2018, 1:03 PM

## 2018-10-09 NOTE — Progress Notes (Signed)
PHARMACY - PHYSICIAN COMMUNICATION CRITICAL VALUE ALERT - BLOOD CULTURE IDENTIFICATION (BCID)  Howard Fox is an 58 y.o. male who presented to [facility] on 10/07/2018 with a chief complaint of back pain.  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 Grandville Silos  Current antibiotics: none  Changes to prescribed antibiotics recommended:  Recommendations accepted by provider- start vancomycin  Results for orders placed or performed during the hospital encounter of 10/07/18  Blood Culture ID Panel (Reflexed) (Collected: 10/08/2018  8:54 AM)  Result Value Ref Range   Enterococcus species NOT DETECTED NOT DETECTED   Listeria monocytogenes NOT DETECTED NOT DETECTED   Staphylococcus species DETECTED (A) NOT DETECTED   Staphylococcus aureus (BCID) DETECTED (A) NOT DETECTED   Methicillin resistance DETECTED (A) NOT DETECTED   Streptococcus species NOT DETECTED NOT DETECTED   Streptococcus agalactiae NOT DETECTED NOT DETECTED   Streptococcus pneumoniae NOT DETECTED NOT DETECTED   Streptococcus pyogenes NOT DETECTED NOT DETECTED   Acinetobacter baumannii NOT DETECTED NOT DETECTED   Enterobacteriaceae species NOT DETECTED NOT DETECTED   Enterobacter cloacae complex NOT DETECTED NOT DETECTED   Escherichia coli NOT DETECTED NOT DETECTED   Klebsiella oxytoca NOT DETECTED NOT DETECTED   Klebsiella pneumoniae NOT DETECTED NOT DETECTED   Proteus species NOT DETECTED NOT DETECTED   Serratia marcescens NOT DETECTED NOT DETECTED   Haemophilus influenzae NOT DETECTED NOT DETECTED   Neisseria meningitidis NOT DETECTED NOT DETECTED   Pseudomonas aeruginosa NOT DETECTED NOT DETECTED   Candida albicans NOT DETECTED NOT DETECTED   Candida glabrata NOT DETECTED NOT DETECTED   Candida krusei NOT DETECTED NOT DETECTED   Candida parapsilosis NOT DETECTED NOT DETECTED    Candida tropicalis NOT DETECTED NOT DETECTED    Eudelia Bunch, Pharm.D 10/09/2018 7:50 AM

## 2018-10-10 ENCOUNTER — Inpatient Hospital Stay (HOSPITAL_COMMUNITY): Payer: Medicare Other

## 2018-10-10 DIAGNOSIS — M549 Dorsalgia, unspecified: Secondary | ICD-10-CM

## 2018-10-10 DIAGNOSIS — R7881 Bacteremia: Secondary | ICD-10-CM

## 2018-10-10 DIAGNOSIS — M4645 Discitis, unspecified, thoracolumbar region: Secondary | ICD-10-CM

## 2018-10-10 DIAGNOSIS — R825 Elevated urine levels of drugs, medicaments and biological substances: Secondary | ICD-10-CM

## 2018-10-10 LAB — ECHOCARDIOGRAM COMPLETE
Height: 71 in
Weight: 3840 oz

## 2018-10-10 LAB — HEPATITIS A ANTIBODY, TOTAL: hep A Total Ab: POSITIVE — AB

## 2018-10-10 LAB — CBC WITH DIFFERENTIAL/PLATELET
Abs Immature Granulocytes: 0.1 10*3/uL — ABNORMAL HIGH (ref 0.00–0.07)
Basophils Absolute: 0.1 10*3/uL (ref 0.0–0.1)
Basophils Relative: 1 %
Eosinophils Absolute: 0.2 10*3/uL (ref 0.0–0.5)
Eosinophils Relative: 2 %
HCT: 39.6 % (ref 39.0–52.0)
Hemoglobin: 12.9 g/dL — ABNORMAL LOW (ref 13.0–17.0)
Immature Granulocytes: 1 %
Lymphocytes Relative: 17 %
Lymphs Abs: 1.5 10*3/uL (ref 0.7–4.0)
MCH: 31.3 pg (ref 26.0–34.0)
MCHC: 32.6 g/dL (ref 30.0–36.0)
MCV: 96.1 fL (ref 80.0–100.0)
Monocytes Absolute: 1.3 10*3/uL — ABNORMAL HIGH (ref 0.1–1.0)
Monocytes Relative: 14 %
Neutro Abs: 5.8 10*3/uL (ref 1.7–7.7)
Neutrophils Relative %: 65 %
Platelets: 260 10*3/uL (ref 150–400)
RBC: 4.12 MIL/uL — ABNORMAL LOW (ref 4.22–5.81)
RDW: 13 % (ref 11.5–15.5)
WBC: 9 10*3/uL (ref 4.0–10.5)
nRBC: 0 % (ref 0.0–0.2)

## 2018-10-10 LAB — BASIC METABOLIC PANEL
Anion gap: 10 (ref 5–15)
BUN: 18 mg/dL (ref 6–20)
CO2: 25 mmol/L (ref 22–32)
Calcium: 9.2 mg/dL (ref 8.9–10.3)
Chloride: 100 mmol/L (ref 98–111)
Creatinine, Ser: 1.01 mg/dL (ref 0.61–1.24)
GFR calc Af Amer: >60 mL/min (ref >60)
GFR calc non Af Amer: >60 mL/min (ref >60)
Glucose, Bld: 111 mg/dL — ABNORMAL HIGH (ref 70–99)
Potassium: 4.6 mmol/L (ref 3.5–5.1)
Sodium: 135 mmol/L (ref 135–145)

## 2018-10-10 LAB — MAGNESIUM: Magnesium: 2.3 mg/dL (ref 1.7–2.4)

## 2018-10-10 LAB — HEPATITIS B SURFACE ANTIGEN: Hepatitis B Surface Ag: NEGATIVE

## 2018-10-10 MED ORDER — PERFLUTREN LIPID MICROSPHERE
1.0000 mL | INTRAVENOUS | Status: AC | PRN
  Administered 2018-10-10: 2 mL via INTRAVENOUS
  Filled 2018-10-10: qty 10

## 2018-10-10 NOTE — Progress Notes (Signed)
  Echocardiogram 2D Echocardiogram with definity has been performed.  Elide Stalzer L Androw 10/10/2018, 3:44 PM

## 2018-10-10 NOTE — Progress Notes (Signed)
PROGRESS NOTE    Howard Fox  JGG:[id number] DOB: Nov 10, 1960 DOA: 10/07/2018 PCP: Unk Pinto, MD   Brief Narrative: Howard Fox is a 58 y.o. male with medical history significant of hypertension, hyperlipidemia, prediabetes, polytrauma years ago, chronic pain syndrome who presents to the emergency department with complaints of worsening back pain ,left-sided rib pain and mild grade fever. MRI, CT imagings done in the emergency department showed discitis/osteomyelitis at T10-T12 with right paraspinal phlegmon.  Blood culture showed MRSA.  Anaerobic/aerobic culture also showed MRSA.  ID following.  Currently on vancomycin  Assessment & Plan:   Principal Problem:   Discitis Active Problems:   Essential hypertension   Hyperlipidemia, mixed   Hepatitis C, chronic (HCC)   Tobacco use disorder   Prediabetes   Bacteremia due to Gram-positive bacteria   Discitis/osteomyelitis: Presented with back pain, fever.  MRI thoracic spine showed severe marrow edema and enhancement in the T10 and T11 vertebral bodies  concerning for discitis-osteomyelitis at T10-11. Also showed severe anterior paravertebral soft tissue edema and enhancement at T11-12 without a drainable component most consistent with a phlegmon. Underwent  IR for drainage of the discitis area.  Aerobic/anaerobic culture showed MRSA. Back pain has improved.  Currently afebrile .Continue vancomycin  MRSA bacteremia: Continue vancomycin.  ID following.Wait for sensitivity.Patient reported that he had prior history of IVDU.  Hypertension: Currently blood pressure stable.  Resume home medications.  Hyperlipidemia: Continue Crestor  Hepatitis C: Follows as an outpatient with liver clinic.  Not treated yet.  We recommend continued follow-up as an outpatient.Follow up HCV RNA.  Smoker: Smokes half pack a day for last several years.  Counseled for smoking cessation.  Continue  nicotine patch.  Chronic alcohol abuse: Drinks  3 bottles of 12 ounce beer every day.  Counseled for cessation.  Monitor for withdrawal.  Prediabetes: Not on medications at home.  Hemoglobin A1c level of 5.7  Chronic pain syndrome/chronic back pain/anxiety: Follows with orthopedics as an outpatient.  Continue supportive care,pain meds.  Has history of muscle spasms.  Resume his home meds  Constipation: Continue bowel regimen.         DVT prophylaxis:ScD Code Status: Full Family Communication: None present at the bedside Disposition Plan: Home after ID clearance   Consultants: ID  Procedures: Aspiration of discitis  Antimicrobials:  Anti-infectives (From admission, onward)   Start     Dose/Rate Route Frequency Ordered Stop   10/09/18 2200  vancomycin (VANCOCIN) 1,250 mg in sodium chloride 0.9 % 250 mL IVPB     1,250 mg 166.7 mL/hr over 90 Minutes Intravenous Every 12 hours 10/09/18 0803     10/09/18 0800  vancomycin (VANCOCIN) 2,500 mg in sodium chloride 0.9 % 500 mL IVPB     2,500 mg 250 mL/hr over 120 Minutes Intravenous  Once 10/09/18 0745 10/09/18 1154   10/08/18 0845  vancomycin (VANCOCIN) IVPB 1000 mg/200 mL premix  Status:  Discontinued     1,000 mg 200 mL/hr over 60 Minutes Intravenous  Once 10/08/18 0835 10/08/18 0908      Subjective: Patient seen and examined at bedside this morning.  Remains comfortable.  Bothered with constipation.  Afebrile.  Hemodynamically stable.  Denies any specific complaints.  Objective: Vitals:   10/09/18 0548 10/09/18 1340 10/09/18 2127 10/10/18 0526  BP: 112/67 121/72 113/63 (!) 144/83  Pulse: 79 90 85 77  Resp: 18 18 16 16   Temp: 98.3 F (36.8 C) (!) 101 F (38.3 C) 99.6 F (37.6 C) (!) 97.4 F (  36.3 C)  TempSrc: Oral Oral Oral Oral  SpO2: 93% 95% 95% 96%  Weight:      Height:        Intake/Output Summary (Last 24 hours) at 10/10/2018 1315 Last data filed at 10/10/2018 1157 Gross per 24 hour  Intake 480 ml  Output 625 ml  Net -145 ml   Filed Weights   10/07/18  2130  Weight: 108.9 kg    Examination:  General exam: Appears calm and comfortable ,Not in distress,average built HEENT:PERRL,Oral mucosa moist, Ear/Nose normal on gross exam Respiratory system: Bilateral equal air entry, normal vesicular breath sounds, no wheezes or crackles  Cardiovascular system: S1 & S2 heard, RRR. No JVD, murmurs, rubs, gallops or clicks. No pedal edema. Gastrointestinal system: Abdomen is nondistended, soft and nontender. No organomegaly or masses felt. Normal bowel sounds heard. Central nervous system: Alert and oriented. No focal neurological deficits. Extremities: No edema, no clubbing ,no cyanosis, distal peripheral pulses palpable. Skin: No rashes, lesions or ulcers,no icterus ,no pallor MSK: Normal muscle bulk,tone ,power Psychiatry: Judgement and insight appear normal. Mood & affect appropriate.     Data Reviewed: I have personally reviewed following labs and imaging studies  CBC: Recent Labs  Lab 10/07/18 2150 10/09/18 0419 10/10/18 0451  WBC 10.4 12.5* 9.0  NEUTROABS 7.2  --  5.8  HGB 13.4 13.3 12.9*  HCT 41.2 41.3 39.6  MCV 95.8 96.5 96.1  PLT 271 282 262   Basic Metabolic Panel: Recent Labs  Lab 10/07/18 2150 10/09/18 0419 10/10/18 0451  NA 137 132* 135  K 4.3 4.1 4.6  CL 104 98 100  CO2 25 23 25   GLUCOSE 99 115* 111*  BUN 13 18 18   CREATININE 0.78 0.88 1.01  CALCIUM 9.2 9.1 9.2  MG  --   --  2.3   GFR: Estimated Creatinine Clearance: 101.2 mL/min (by C-G formula based on SCr of 1.01 mg/dL). Liver Function Tests: Recent Labs  Lab 10/07/18 2150  AST 18  ALT 18  ALKPHOS 84  BILITOT 0.4  PROT 7.5  ALBUMIN 3.4*   Recent Labs  Lab 10/07/18 2150  LIPASE 21   No results for input(s): AMMONIA in the last 168 hours. Coagulation Profile: Recent Labs  Lab 10/08/18 1057  INR 1.0   Cardiac Enzymes: Recent Labs  Lab 10/07/18 2150  TROPONINI <0.03   BNP (last 3 results) No results for input(s): PROBNP in the last  8760 hours. HbA1C: Recent Labs    10/08/18 0517  HGBA1C 5.7*   CBG: No results for input(s): GLUCAP in the last 168 hours. Lipid Profile: No results for input(s): CHOL, HDL, LDLCALC, TRIG, CHOLHDL, LDLDIRECT in the last 72 hours. Thyroid Function Tests: No results for input(s): TSH, T4TOTAL, FREET4, T3FREE, THYROIDAB in the last 72 hours. Anemia Panel: No results for input(s): VITAMINB12, FOLATE, FERRITIN, TIBC, IRON, RETICCTPCT in the last 72 hours. Sepsis Labs: No results for input(s): PROCALCITON, LATICACIDVEN in the last 168 hours.  Recent Results (from the past 240 hour(s))  SARS Coronavirus 2 (CEPHEID - Performed in [facility] hospital lab), Hosp Order     Status: None   Collection Time: 10/07/18 10:28 PM  Result Value Ref Range Status   SARS Coronavirus 2 NEGATIVE NEGATIVE Final    Comment: (NOTE) If result is NEGATIVE SARS-CoV-2 target nucleic acids are NOT DETECTED. The SARS-CoV-2 RNA is generally detectable in upper and lower  respiratory specimens during the acute phase of infection. The lowest  concentration of SARS-CoV-2 viral copies  this assay can detect is 250  copies / mL. A negative result does not preclude SARS-CoV-2 infection  and should not be used as the sole basis for treatment or other  patient management decisions.  A negative result may occur with  improper specimen collection / handling, submission of specimen other  than nasopharyngeal swab, presence of viral mutation(s) within the  areas targeted by this assay, and inadequate number of viral copies  (<250 copies / mL). A negative result must be combined with clinical  observations, patient history, and epidemiological information. If result is POSITIVE SARS-CoV-2 target nucleic acids are DETECTED. The SARS-CoV-2 RNA is generally detectable in upper and lower  respiratory specimens dur ing the acute phase of infection.  Positive  results are indicative of active infection with SARS-CoV-2.   Clinical  correlation with patient history and other diagnostic information is  necessary to determine patient infection status.  Positive results do  not rule out bacterial infection or co-infection with other viruses. If result is PRESUMPTIVE POSTIVE SARS-CoV-2 nucleic acids MAY BE PRESENT.   A presumptive positive result was obtained on the submitted specimen  and confirmed on repeat testing.  While 2019 novel coronavirus  (SARS-CoV-2) nucleic acids may be present in the submitted sample  additional confirmatory testing may be necessary for epidemiological  and / or clinical management purposes  to differentiate between  SARS-CoV-2 and other Sarbecovirus currently known to infect humans.  If clinically indicated additional testing with an alternate test  methodology [phone]) is advised. The SARS-CoV-2 RNA is generally  detectable in upper and lower respiratory sp ecimens during the acute  phase of infection. The expected result is Negative. Fact Sheet for Patients:  StrictlyIdeas.no Fact Sheet for Healthcare Providers: BankingDealers.co.za This test is not yet approved or cleared by the [place] FDA and has been authorized for detection and/or diagnosis of SARS-CoV-2 by FDA under an Emergency Use Authorization (EUA).  This EUA will remain in effect (meaning this test can be used) for the duration of the COVID-19 declaration under Section 564(b)(1) of the Act, 21 U.S.C. section 360bbb-3(b)(1), unless the authorization is terminated or revoked sooner. Performed at [facility], [place] [address], [place], [place] [address]   Culture, blood (Routine X 2) w Reflex to ID Panel     Status: Abnormal (Preliminary result)   Collection Time: 10/08/18  8:54 AM  Result Value Ref Range Status   Specimen Description   Final    LEFT ANTECUBITAL Performed at [place] [address] [place], [place]  [address]    Special Requests   Final    BOTTLES DRAWN AEROBIC AND ANAEROBIC Blood Culture adequate volume Performed at [place] [address] [place], [place] [address]    Culture  Setup Time   Final    GRAM POSITIVE COCCI AEROBIC BOTTLE ONLY CRITICAL RESULT CALLED TO, READ BACK BY AND VERIFIED WITH: M. RENZ PHARMD, AT 0706 10/09/18 BY Rush Landmark Performed at [facility], [place] [address] [place], [place] [address]    Culture STAPHYLOCOCCUS AUREUS (A)  Final   Report Status PENDING  Incomplete  Blood Culture ID Panel (Reflexed)     Status: Abnormal   Collection Time: 10/08/18  8:54 AM  Result Value Ref Range Status   Enterococcus species NOT DETECTED NOT DETECTED Final   Listeria monocytogenes NOT DETECTED NOT DETECTED Final   Staphylococcus species DETECTED (A) NOT DETECTED Final    Comment: CRITICAL RESULT  CALLED TO, READ BACK BY AND VERIFIED WITH: M. RENZ PHARMD, AT 0706 10/09/18 BY D. VANHOOK    Staphylococcus aureus (BCID) DETECTED (A) NOT DETECTED Final    Comment: Methicillin (oxacillin)-resistant Staphylococcus aureus (MRSA). MRSA is predictably resistant to beta-lactam antibiotics (except ceftaroline). Preferred therapy is vancomycin unless clinically contraindicated. Patient requires contact precautions if  hospitalized. CRITICAL RESULT CALLED TO, READ BACK BY AND VERIFIED WITH: M. RENZ PHARMD, AT 4270 10/09/18 BY D. VANHOOK    Methicillin resistance DETECTED (A) NOT DETECTED Final    Comment: CRITICAL RESULT CALLED TO, READ BACK BY AND VERIFIED WITH: M. RENZ PHARMD, AT 0706 10/09/18 BY D. VANHOOK    Streptococcus species NOT DETECTED NOT DETECTED Final   Streptococcus agalactiae NOT DETECTED NOT DETECTED Final   Streptococcus pneumoniae NOT DETECTED NOT DETECTED Final   Streptococcus pyogenes NOT DETECTED NOT DETECTED Final   Acinetobacter baumannii NOT DETECTED NOT DETECTED Final   Enterobacteriaceae species NOT DETECTED NOT DETECTED Final    Enterobacter cloacae complex NOT DETECTED NOT DETECTED Final   Escherichia coli NOT DETECTED NOT DETECTED Final   Klebsiella oxytoca NOT DETECTED NOT DETECTED Final   Klebsiella pneumoniae NOT DETECTED NOT DETECTED Final   Proteus species NOT DETECTED NOT DETECTED Final   Serratia marcescens NOT DETECTED NOT DETECTED Final   Haemophilus influenzae NOT DETECTED NOT DETECTED Final   Neisseria meningitidis NOT DETECTED NOT DETECTED Final   Pseudomonas aeruginosa NOT DETECTED NOT DETECTED Final   Candida albicans NOT DETECTED NOT DETECTED Final   Candida glabrata NOT DETECTED NOT DETECTED Final   Candida krusei NOT DETECTED NOT DETECTED Final   Candida parapsilosis NOT DETECTED NOT DETECTED Final   Candida tropicalis NOT DETECTED NOT DETECTED Final    Comment: Performed at [facility], [place] [address], [place], [place] [address]  Culture, blood (Routine X 2) w Reflex to ID Panel     Status: Abnormal (Preliminary result)   Collection Time: 10/08/18  8:59 AM  Result Value Ref Range Status   Specimen Description   Final    RIGHT ANTECUBITAL Performed at [place] [address], [place], [place] [address]    Special Requests   Final    BOTTLES DRAWN AEROBIC AND ANAEROBIC Blood Culture adequate volume Performed at [place] [address], [place], [place] [address]    Culture  Setup Time   Final    GRAM POSITIVE COCCI IN CLUSTERS ANAEROBIC BOTTLE ONLY CRITICAL VALUE NOTED.  VALUE IS CONSISTENT WITH PREVIOUSLY REPORTED AND CALLED VALUE. Performed at [facility], [place] [address], [place], [place] [address]    Culture STAPHYLOCOCCUS AUREUS (A)  Final   Report Status PENDING  Incomplete  Aerobic/Anaerobic Culture (surgical/deep wound)     Status: None (Preliminary result)   Collection Time: 10/08/18 12:33 PM  Result Value Ref Range Status   Specimen Description   Final    ABSCESS SPINE Performed at [facility] [address], [place], [place] [address]    Special Requests   Final    NONE Performed at [facility], [place] [address], [place], [place] [address]    Gram Stain   Final    ABUNDANT WBC PRESENT,BOTH PMN AND MONONUCLEAR RARE GRAM POSITIVE COCCI Performed at [facility], [place] [address] [place], [place] [address]    Culture   Final    FEW METHICILLIN RESISTANT STAPHYLOCOCCUS AUREUS NO ANAEROBES ISOLATED; CULTURE IN PROGRESS FOR 5  DAYS    Report Status PENDING  Incomplete   Organism ID, Bacteria METHICILLIN RESISTANT STAPHYLOCOCCUS AUREUS  Final      Susceptibility   Methicillin resistant staphylococcus aureus - MIC*    CIPROFLOXACIN >=8 RESISTANT Resistant     ERYTHROMYCIN >=8 RESISTANT Resistant     GENTAMICIN <=0.5 SENSITIVE Sensitive     OXACILLIN >=4 RESISTANT Resistant     TETRACYCLINE <=1 SENSITIVE Sensitive     VANCOMYCIN 1 SENSITIVE Sensitive     TRIMETH/SULFA <=10 SENSITIVE Sensitive     CLINDAMYCIN <=0.25 SENSITIVE Sensitive     RIFAMPIN <=0.5 SENSITIVE Sensitive     Inducible Clindamycin NEGATIVE Sensitive     * FEW METHICILLIN RESISTANT STAPHYLOCOCCUS AUREUS         Radiology Studies: Dg Abd 2 Views  Result Date: 10/09/2018 CLINICAL DATA:  Pain. EXAM: ABDOMEN - 2 VIEW COMPARISON:  CT dated 10/08/2018 FINDINGS: There is a large amount of stool in the right hemicolon. There is gaseous distention of multiple loops of colon. There is no pneumatosis or evidence of free air. There is a relative paucity of bowel gas at the level of the rectum. Multiple old left-sided rib fractures are noted, status post fixation. Many of the wires are fractured. There are few lucencies overlying the liver, likely artifact. IMPRESSION: 1. Large amount of stool in the right hemicolon/cecum. There is mild gaseous distention of the transverse colon and splenic flexure. 2. Relative paucity of bowel gas at the level of the rectum. 3. Nonobstructive bowel gas pattern  Electronically Signed   By: Constance Holster M.D.   On: 10/09/2018 20:04        Scheduled Meds: . amitriptyline  150 mg Oral QHS  . aspirin EC  325 mg Oral Daily  . citalopram  40 mg Oral Daily  . gabapentin  300 mg Oral TID  . lisinopril  10 mg Oral Daily   And  . hydrochlorothiazide  12.5 mg Oral Daily  . nicotine  14 mg Transdermal Daily  . polyethylene glycol  17 g Oral BID  . rosuvastatin  40 mg Oral Daily  . senna-docusate  1 tablet Oral BID   Continuous Infusions: . vancomycin 1,250 mg (10/10/18 0859)     LOS: 2 days    Time spent: 25 mins.More than 50% of that time was spent in counseling and/or coordination of care.      Shelly Coss, MD [facility] Pager [phone]  If 7PM-7AM, please contact night-coverage [email or web address] Password [email or web address] 10/10/2018, 1:15 PM

## 2018-10-10 NOTE — Progress Notes (Signed)
Subjective: Complaining of left-sided flank pain   Antibiotics:  Anti-infectives (From admission, onward)   Start     Dose/Rate Route Frequency Ordered Stop   10/09/18 2200  vancomycin (VANCOCIN) 1,250 mg in sodium chloride 0.9 % 250 mL IVPB     1,250 mg 166.7 mL/hr over 90 Minutes Intravenous Every 12 hours 10/09/18 0803     10/09/18 0800  vancomycin (VANCOCIN) 2,500 mg in sodium chloride 0.9 % 500 mL IVPB     2,500 mg 250 mL/hr over 120 Minutes Intravenous  Once 10/09/18 0745 10/09/18 1154   10/08/18 0845  vancomycin (VANCOCIN) IVPB 1000 mg/200 mL premix  Status:  Discontinued     1,000 mg 200 mL/hr over 60 Minutes Intravenous  Once 10/08/18 0835 10/08/18 0908      Medications: Scheduled Meds: . amitriptyline  150 mg Oral QHS  . aspirin EC  325 mg Oral Daily  . citalopram  40 mg Oral Daily  . gabapentin  300 mg Oral TID  . lisinopril  10 mg Oral Daily   And  . hydrochlorothiazide  12.5 mg Oral Daily  . nicotine  14 mg Transdermal Daily  . polyethylene glycol  17 g Oral BID  . rosuvastatin  40 mg Oral Daily  . senna-docusate  1 tablet Oral BID   Continuous Infusions: . vancomycin 1,250 mg (10/10/18 0859)   PRN Meds:.ALPRAZolam, carisoprodol, morphine injection, Muscle Rub, oxyCODONE-acetaminophen **AND** oxyCODONE    Objective: Weight change:   Intake/Output Summary (Last 24 hours) at 10/10/2018 1815 Last data filed at 10/10/2018 1400 Gross per 24 hour  Intake 1196.92 ml  Output 500 ml  Net 696.92 ml   Blood pressure 110/66, pulse 79, temperature 98 F (36.7 C), temperature source Oral, resp. rate 17, height 5\' 11"  (1.803 m), weight 108.9 kg, SpO2 97 %. Temp:  [97.4 F (36.3 C)-99.6 F (37.6 C)] 98 F (36.7 C) (05/20 1334) Pulse Rate:  [77-85] 79 (05/20 1334) Resp:  [16-17] 17 (05/20 1334) BP: (110-144)/(63-83) 110/66 (05/20 1334) SpO2:  [95 %-97 %] 97 % (05/20 1334)  Physical Exam: General: Alert and awake, oriented x3, not in any acute  distress. HEENT: anicteric sclera, EOMI CVS regular rate, normal  No mgr Chest: , no wheezing, no respiratory distress ctab, he has discomfort when getting up out of bed on the left side Abdomen: soft non-distended,  Extremities: no edema or deformity noted bilaterally Skin: no rashes Neuro: nonfocal  CBC:    BMET Recent Labs    10/09/18 0419 10/10/18 0451  NA 132* 135  K 4.1 4.6  CL 98 100  CO2 23 25  GLUCOSE 115* 111*  BUN 18 18  CREATININE 0.88 1.01  CALCIUM 9.1 9.2     Liver Panel  Recent Labs    10/07/18 2150  PROT 7.5  ALBUMIN 3.4*  AST 18  ALT 18  ALKPHOS 84  BILITOT 0.4       Sedimentation Rate Recent Labs    10/09/18 0419  ESRSEDRATE 59*   C-Reactive Protein Recent Labs    10/07/18 2150 10/09/18 0419  CRP 7.2* 11.2*    Micro Results: Recent Results (from the past 720 hour(s))  SARS Coronavirus 2 (CEPHEID - Performed in [place] hospital lab), Hosp Order     Status: None   Collection Time: 10/07/18 10:28 PM  Result Value Ref Range Status   SARS Coronavirus 2 NEGATIVE NEGATIVE Final    Comment: (NOTE) If result is NEGATIVE SARS-CoV-2  target nucleic acids are NOT DETECTED. The SARS-CoV-2 RNA is generally detectable in upper and lower  respiratory specimens during the acute phase of infection. The lowest  concentration of SARS-CoV-2 viral copies this assay can detect is 250  copies / mL. A negative result does not preclude SARS-CoV-2 infection  and should not be used as the sole basis for treatment or other  patient management decisions.  A negative result may occur with  improper specimen collection / handling, submission of specimen other  than nasopharyngeal swab, presence of viral mutation(s) within the  areas targeted by this assay, and inadequate number of viral copies  (<250 copies / mL). A negative result must be combined with clinical  observations, patient history, and epidemiological information. If result is POSITIVE  SARS-CoV-2 target nucleic acids are DETECTED. The SARS-CoV-2 RNA is generally detectable in upper and lower  respiratory specimens dur ing the acute phase of infection.  Positive  results are indicative of active infection with SARS-CoV-2.  Clinical  correlation with patient history and other diagnostic information is  necessary to determine patient infection status.  Positive results do  not rule out bacterial infection or co-infection with other viruses. If result is PRESUMPTIVE POSTIVE SARS-CoV-2 nucleic acids MAY BE PRESENT.   A presumptive positive result was obtained on the submitted specimen  and confirmed on repeat testing.  While 2019 novel coronavirus  (SARS-CoV-2) nucleic acids may be present in the submitted sample  additional confirmatory testing may be necessary for epidemiological  and / or clinical management purposes  to differentiate between  SARS-CoV-2 and other Sarbecovirus currently known to infect humans.  If clinically indicated additional testing with an alternate test  methodology [phone]) is advised. The SARS-CoV-2 RNA is generally  detectable in upper and lower respiratory sp ecimens during the acute  phase of infection. The expected result is Negative. Fact Sheet for Patients:  StrictlyIdeas.no Fact Sheet for Healthcare Providers: BankingDealers.co.za This test is not yet approved or cleared by the [place] FDA and has been authorized for detection and/or diagnosis of SARS-CoV-2 by FDA under an Emergency Use Authorization (EUA).  This EUA will remain in effect (meaning this test can be used) for the duration of the COVID-19 declaration under Section 564(b)(1) of the Act, 21 U.S.C. section 360bbb-3(b)(1), unless the authorization is terminated or revoked sooner. Performed at [facility], [place] [address], [place], [place] [address]   Culture, blood (Routine X 2) w Reflex to ID Panel      Status: Abnormal (Preliminary result)   Collection Time: 10/08/18  8:54 AM  Result Value Ref Range Status   Specimen Description   Final    LEFT ANTECUBITAL Performed at [facility] [address], [place], [place] [address]    Special Requests   Final    BOTTLES DRAWN AEROBIC AND ANAEROBIC Blood Culture adequate volume Performed at [facility] [address], [place], [place] [address]    Culture  Setup Time   Final    GRAM POSITIVE COCCI AEROBIC BOTTLE ONLY CRITICAL RESULT CALLED TO, READ BACK BY AND VERIFIED WITH: M. RENZ PHARMD, AT 0706 10/09/18 BY Rush Landmark Performed at [facility], [place] [address] [place], [place] [address]    Culture STAPHYLOCOCCUS AUREUS (A)  Final   Report Status PENDING  Incomplete  Blood Culture ID Panel (Reflexed)     Status: Abnormal   Collection Time: 10/08/18  8:54 AM  Result Value Ref Range Status  Enterococcus species NOT DETECTED NOT DETECTED Final   Listeria monocytogenes NOT DETECTED NOT DETECTED Final   Staphylococcus species DETECTED (A) NOT DETECTED Final    Comment: CRITICAL RESULT CALLED TO, READ BACK BY AND VERIFIED WITH: M. RENZ PHARMD, AT 0706 10/09/18 BY D. VANHOOK    Staphylococcus aureus (BCID) DETECTED (A) NOT DETECTED Final    Comment: Methicillin (oxacillin)-resistant Staphylococcus aureus (MRSA). MRSA is predictably resistant to beta-lactam antibiotics (except ceftaroline). Preferred therapy is vancomycin unless clinically contraindicated. Patient requires contact precautions if  hospitalized. CRITICAL RESULT CALLED TO, READ BACK BY AND VERIFIED WITH: M. RENZ PHARMD, AT 4235 10/09/18 BY D. VANHOOK    Methicillin resistance DETECTED (A) NOT DETECTED Final    Comment: CRITICAL RESULT CALLED TO, READ BACK BY AND VERIFIED WITH: M. RENZ PHARMD, AT 0706 10/09/18 BY D. VANHOOK    Streptococcus species NOT DETECTED NOT DETECTED Final   Streptococcus agalactiae NOT DETECTED NOT DETECTED  Final   Streptococcus pneumoniae NOT DETECTED NOT DETECTED Final   Streptococcus pyogenes NOT DETECTED NOT DETECTED Final   Acinetobacter baumannii NOT DETECTED NOT DETECTED Final   Enterobacteriaceae species NOT DETECTED NOT DETECTED Final   Enterobacter cloacae complex NOT DETECTED NOT DETECTED Final   Escherichia coli NOT DETECTED NOT DETECTED Final   Klebsiella oxytoca NOT DETECTED NOT DETECTED Final   Klebsiella pneumoniae NOT DETECTED NOT DETECTED Final   Proteus species NOT DETECTED NOT DETECTED Final   Serratia marcescens NOT DETECTED NOT DETECTED Final   Haemophilus influenzae NOT DETECTED NOT DETECTED Final   Neisseria meningitidis NOT DETECTED NOT DETECTED Final   Pseudomonas aeruginosa NOT DETECTED NOT DETECTED Final   Candida albicans NOT DETECTED NOT DETECTED Final   Candida glabrata NOT DETECTED NOT DETECTED Final   Candida krusei NOT DETECTED NOT DETECTED Final   Candida parapsilosis NOT DETECTED NOT DETECTED Final   Candida tropicalis NOT DETECTED NOT DETECTED Final    Comment: Performed at [facility], [place] [address] [place], [place] [address]  Culture, blood (Routine X 2) w Reflex to ID Panel     Status: Abnormal (Preliminary result)   Collection Time: 10/08/18  8:59 AM  Result Value Ref Range Status   Specimen Description   Final    RIGHT ANTECUBITAL Performed at [facility] [address] [place], [place] [address]    Special Requests   Final    BOTTLES DRAWN AEROBIC AND ANAEROBIC Blood Culture adequate volume Performed at [facility] [address] [place], [place] [address]    Culture  Setup Time   Final    GRAM POSITIVE COCCI IN CLUSTERS ANAEROBIC BOTTLE ONLY CRITICAL VALUE NOTED.  VALUE IS CONSISTENT WITH PREVIOUSLY REPORTED AND CALLED VALUE. Performed at [facility], [place] [address], [place], [place] [address]    Culture STAPHYLOCOCCUS AUREUS (A)  Final   Report Status PENDING  Incomplete   Aerobic/Anaerobic Culture (surgical/deep wound)     Status: None (Preliminary result)   Collection Time: 10/08/18 12:33 PM  Result Value Ref Range Status   Specimen Description   Final    ABSCESS SPINE Performed at [facility] [address] [place], [place] [address]    Special Requests   Final    NONE Performed at [facility], [place] [address], [place], [place] [address]    Gram Stain   Final    ABUNDANT WBC PRESENT,BOTH PMN AND MONONUCLEAR RARE GRAM POSITIVE COCCI Performed at [facility], [address], [place], [place] [address]    Culture   Final    FEW METHICILLIN RESISTANT STAPHYLOCOCCUS AUREUS NO ANAEROBES ISOLATED; CULTURE IN PROGRESS FOR 5 DAYS    Report Status PENDING  Incomplete   Organism ID, Bacteria METHICILLIN RESISTANT STAPHYLOCOCCUS AUREUS  Final      Susceptibility   Methicillin resistant staphylococcus aureus - MIC*    CIPROFLOXACIN >=8 RESISTANT Resistant     ERYTHROMYCIN >=8 RESISTANT Resistant     GENTAMICIN <=0.5 SENSITIVE Sensitive     OXACILLIN >=4 RESISTANT Resistant     TETRACYCLINE <=1 SENSITIVE Sensitive     VANCOMYCIN 1 SENSITIVE Sensitive     TRIMETH/SULFA <=10 SENSITIVE Sensitive     CLINDAMYCIN <=0.25 SENSITIVE Sensitive     RIFAMPIN <=0.5 SENSITIVE Sensitive     Inducible Clindamycin NEGATIVE Sensitive     * FEW METHICILLIN RESISTANT STAPHYLOCOCCUS AUREUS  Culture, blood (Routine X 2) w Reflex to ID Panel     Status: None (Preliminary result)   Collection Time: 10/09/18 10:35 AM  Result Value Ref Range Status   Specimen Description   Final    BLOOD LEFT ARM Performed at [facility] [address] [place], [place] [address]    Special Requests   Final    BAA BCAV Performed at [facility] [address] [place], [place] [address]    Culture   Final    NO GROWTH 1 DAY Performed at [facility], [place] [address] [place], [place] [address]     Report Status PENDING  Incomplete  Culture, blood (Routine X 2) w Reflex to ID Panel     Status: None (Preliminary result)   Collection Time: 10/09/18 10:41 AM  Result Value Ref Range Status   Specimen Description   Final    BLOOD RIGHT HAND Performed at [facility] [address], [place], [place] [address]    Special Requests   Final    BAA BCAV Performed at [facility] [address], [place],  [address]    Culture   Final    NO GROWTH 1 DAY Performed at [facility], [place] [address] [place],  [address]    Report Status PENDING  Incomplete    Studies/Results: Dg Abd 2 Views  Result Date: 10/09/2018 CLINICAL DATA:  Pain. EXAM: ABDOMEN - 2 VIEW COMPARISON:  CT dated 10/08/2018 FINDINGS: There is a large amount of stool in the right hemicolon. There is gaseous distention of multiple loops of colon. There is no pneumatosis or evidence of free air. There is a relative paucity of bowel gas at the level of the rectum. Multiple old left-sided rib fractures are noted, status post fixation. Many of the wires are fractured. There are few lucencies overlying the liver, likely artifact. IMPRESSION: 1. Large amount of stool in the right hemicolon/cecum. There is mild gaseous distention of the transverse colon and splenic flexure. 2. Relative paucity of bowel gas at the level of the rectum. 3. Nonobstructive bowel gas pattern Electronically Signed   By: Constance Holster M.D.   On: 10/09/2018 20:04      Assessment/Plan:  INTERVAL HISTORY:   CT of the abdomen pelvis unremarkable  2D echocardiogram is been performed   Principal Problem:   Discitis Active Problems:   Essential hypertension   Hyperlipidemia, mixed   Hepatitis C, chronic (HCC)   Tobacco use disorder   Prediabetes   Bacteremia due to  Gram-positive bacteria    Howard Fox is a 58 y.o. male with  T 10-11 diskitis, osteomyelitis and now found to have MRSA  bacteremia. He has Chronic HCV without hepatic coma and admitted to me prior injection drug use though claims he has been clean since 2008  #1      [facility] Staphylococcus aureus bacteremia   Staphylococcus aureus bacteremia (SAB) is associated with a high rate of complications and mortality.  Specific aspects of clinical management are critical to optimizing the outcome of patients with SAB.  Therefore, the [facility] Antimicrobial Management Team [facility]) has initiated an intervention aimed at improving the management of SAB at [facility].  To do so, Infectious Diseases physicians are providing an evidence-based consult for the management of all patients with SAB.     Yes No Comments  Perform follow-up blood cultures (even if the patient is afebrile) to ensure clearance of bacteremia [x]  []  Repeated   Remove vascular catheter and obtain follow-up blood cultures after the removal of the catheter []  []  DO NOT PLACE PICC until we have cultures no growth and final  Perform echocardiography to evaluate for endocarditis (transthoracic ECHO is 40-50% sensitive, TEE is > 90% sensitive) [x]  []  Please keep in mind, that neither test can definitively EXCLUDE endocarditis, and that should clinical suspicion remain high for endocarditis the patient should then still be treated with an "endocarditis" duration of therapy = 6 weeks  Consult electrophysiologist to evaluate implanted cardiac device (pacemaker, ICD) []  []  NA  Ensure source control []  []  Have all abscesses been drained effectively? Have deep seeded infections (septic joints or osteomyelitis) had appropriate surgical debridement?  ? IVDU as source. Spine infected  Investigate for "metastatic" sites of infection []  []  Does the patient have ANY symptom or physical exam finding that would suggest a deeper infection (back or neck pain that may be suggestive of vertebral osteomyelitis or epidural abscess, muscle pain that  could be a symptom of pyomyositis)?  Keep in mind that for deep seeded infections MRI imaging with contrast is preferred rather than other often insensitive tests such as plain x-rays, especially early in a patient's presentation. Spine is only clear site  Change antibiotic therapy to vancomycin []  []  Beta-lactam antibiotics are preferred for MSSA due to higher cure rates.   If on Vancomycin, goal trough should be 15 - 20 mcg/mL  Estimated duration of IV antibiotic therapy:  8 weeks []  []  Consult case management for probably prolonged outpatient IV antibiotic therapy   #2 Vertebral osteomyelitis: duration as above. 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 THC + opiates + but on opiates as outpatient and given here as well. I have concern he is still actively using      LOS: 2 days   Alcide Evener 10/10/2018, 6:15 PM

## 2018-10-11 DIAGNOSIS — M8628 Subacute osteomyelitis, other site: Secondary | ICD-10-CM

## 2018-10-11 LAB — CULTURE, BLOOD (ROUTINE X 2): Special Requests: ADEQUATE

## 2018-10-11 LAB — CREATININE, SERUM
Creatinine, Ser: 0.95 mg/dL (ref 0.61–1.24)
GFR calc Af Amer: >60 mL/min (ref >60)
GFR calc non Af Amer: >60 mL/min (ref >60)

## 2018-10-11 LAB — VANCOMYCIN, TROUGH: Vancomycin Tr: 13 ug/mL — ABNORMAL LOW (ref 15–20)

## 2018-10-11 MED ORDER — MINERAL OIL RE ENEM
1.0000 | ENEMA | Freq: Once | RECTAL | Status: AC
  Administered 2018-10-11: 1 via RECTAL
  Filled 2018-10-11: qty 1

## 2018-10-11 MED ORDER — VANCOMYCIN HCL 10 G IV SOLR
1500.0000 mg | Freq: Two times a day (BID) | INTRAVENOUS | Status: DC
  Administered 2018-10-11 – 2018-10-14 (×6): 1500 mg via INTRAVENOUS
  Filled 2018-10-11 (×7): qty 1500

## 2018-10-11 NOTE — Progress Notes (Signed)
PROGRESS NOTE    Howard Fox  HYQ:[id number] DOB: August 18, 1960 DOA: 10/07/2018 PCP: Unk Pinto, MD   Brief Narrative: Howard Fox is a 58 y.o. male with medical history significant of hypertension, hyperlipidemia, prediabetes, polytrauma years ago, chronic pain syndrome who presents to the emergency department with complaints of worsening back pain ,left-sided rib pain and mild grade fever. MRI, CT imagings done in the emergency department showed discitis/osteomyelitis at T10-T12 with right paraspinal phlegmon.  Blood culture showed MRSA.  Anaerobic/aerobic culture also showed MRSA.  ID following.  Currently on vancomycin. Difficult discharge planning due to his history of IV drug abuse.  Assessment & Plan:   Principal Problem:   Discitis Active Problems:   Essential hypertension   Hyperlipidemia, mixed   Hepatitis C, chronic (HCC)   Tobacco use disorder   Prediabetes   Bacteremia due to Gram-positive bacteria   Back pain   Discitis/osteomyelitis: Presented with back pain, fever.  MRI thoracic spine showed severe marrow edema and enhancement in the T10 and T11 vertebral bodies  concerning for discitis-osteomyelitis at T10-11. Also showed severe anterior paravertebral soft tissue edema and enhancement at T11-12 without a drainable component most consistent with a phlegmon. Underwent  IR for drainage of the discitis area.  Aerobic/anaerobic culture showed MRSA. Back pain has improved.  Currently afebrile .Continue vancomycin  MRSA bacteremia: Continue vancomycin.  ID following.Patient reported that he had prior history of IVDU,last use was 3 years ago.  Hypertension: Currently blood pressure stable.  Resume home medications.  Hyperlipidemia: Continue Crestor  Hepatitis C: Follows as an outpatient with liver clinic.  Not treated yet.  We recommend continued follow-up as an outpatient.Follow up HCV RNA.  Smoker: Smokes half pack a day for last several years.  Counseled  for smoking cessation.  Continue  nicotine patch.  Chronic alcohol abuse: Drinks 3 bottles of 12 ounce beer every day.  Counseled for cessation.  Monitor for withdrawal.  Prediabetes: Not on medications at home.  Hemoglobin A1c level of 5.7  Chronic pain syndrome/chronic back pain/anxiety: Follows with orthopedics as an outpatient.  Continue supportive care,pain meds.  Has history of muscle spasms.  Resume his home meds  Constipation: Continue bowel regimen.Will use enema of no bowel movement.         DVT prophylaxis:ScD Code Status: Full Family Communication: None present at the bedside Disposition Plan: Home after ID clearance   Consultants: ID  Procedures: Aspiration of discitis  Antimicrobials:  Anti-infectives (From admission, onward)   Start     Dose/Rate Route Frequency Ordered Stop   10/11/18 2200  vancomycin (VANCOCIN) 1,500 mg in sodium chloride 0.9 % 500 mL IVPB     1,500 mg 250 mL/hr over 120 Minutes Intravenous Every 12 hours 10/11/18 1102     10/09/18 2200  vancomycin (VANCOCIN) 1,250 mg in sodium chloride 0.9 % 250 mL IVPB  Status:  Discontinued     1,250 mg 166.7 mL/hr over 90 Minutes Intravenous Every 12 hours 10/09/18 0803 10/11/18 1102   10/09/18 0800  vancomycin (VANCOCIN) 2,500 mg in sodium chloride 0.9 % 500 mL IVPB     2,500 mg 250 mL/hr over 120 Minutes Intravenous  Once 10/09/18 0745 10/09/18 1154   10/08/18 0845  vancomycin (VANCOCIN) IVPB 1000 mg/200 mL premix  Status:  Discontinued     1,000 mg 200 mL/hr over 60 Minutes Intravenous  Once 10/08/18 0835 10/08/18 0908      Subjective: Patient seen and examined the bedside this morning.  Hemodynamically stable.  Afebrile.  Still does not have a bowel movement.  Complains of bloating of the abdomen.  Objective: Vitals:   10/10/18 1334 10/10/18 2047 10/10/18 2308 10/11/18 0629  BP: 110/66 102/86 110/83 115/76  Pulse: 79 87 78 72  Resp: 17 18  18   Temp: 98 F (36.7 C) 98.8 F (37.1 C)   98.1 F (36.7 C)  TempSrc: Oral Oral  Oral  SpO2: 97% 98%  96%  Weight:      Height:        Intake/Output Summary (Last 24 hours) at 10/11/2018 1124 Last data filed at 10/11/2018 0454 Gross per 24 hour  Intake 1770.79 ml  Output 1700 ml  Net 70.79 ml   Filed Weights   10/07/18 2130  Weight: 108.9 kg    Examination:  General exam: Appears calm and comfortable ,Not in distress,average built HEENT:PERRL,Oral mucosa moist, Ear/Nose normal on gross exam Respiratory system: Bilateral equal air entry, normal vesicular breath sounds, no wheezes or crackles  Cardiovascular system: S1 & S2 heard, RRR. No JVD, murmurs, rubs, gallops or clicks. Gastrointestinal system: Abdomen is distended, soft and nontender. No organomegaly or masses felt. Normal bowel sounds heard. Central nervous system: Alert and oriented. No focal neurological deficits. Extremities: No edema, no clubbing ,no cyanosis, distal peripheral pulses palpable. Skin: No rashes, lesions or ulcers,no icterus ,no pallor MSK: Normal muscle bulk,tone ,power Psychiatry: Judgement and insight appear normal. Mood & affect appropriate.      Data Reviewed: I have personally reviewed following labs and imaging studies  CBC: Recent Labs  Lab 10/07/18 2150 10/09/18 0419 10/10/18 0451  WBC 10.4 12.5* 9.0  NEUTROABS 7.2  --  5.8  HGB 13.4 13.3 12.9*  HCT 41.2 41.3 39.6  MCV 95.8 96.5 96.1  PLT 271 282 098   Basic Metabolic Panel: Recent Labs  Lab 10/07/18 2150 10/09/18 0419 10/10/18 0451 10/11/18 1023  NA 137 132* 135  --   K 4.3 4.1 4.6  --   CL 104 98 100  --   CO2 25 23 25   --   GLUCOSE 99 115* 111*  --   BUN 13 18 18   --   CREATININE 0.78 0.88 1.01 0.95  CALCIUM 9.2 9.1 9.2  --   MG  --   --  2.3  --    GFR: Estimated Creatinine Clearance: 107.6 mL/min (by C-G formula based on SCr of 0.95 mg/dL). Liver Function Tests: Recent Labs  Lab 10/07/18 2150  AST 18  ALT 18  ALKPHOS 84  BILITOT 0.4  PROT 7.5   ALBUMIN 3.4*   Recent Labs  Lab 10/07/18 2150  LIPASE 21   No results for input(s): AMMONIA in the last 168 hours. Coagulation Profile: Recent Labs  Lab 10/08/18 1057  INR 1.0   Cardiac Enzymes: Recent Labs  Lab 10/07/18 2150  TROPONINI <0.03   BNP (last 3 results) No results for input(s): PROBNP in the last 8760 hours. HbA1C: No results for input(s): HGBA1C in the last 72 hours. CBG: No results for input(s): GLUCAP in the last 168 hours. Lipid Profile: No results for input(s): CHOL, HDL, LDLCALC, TRIG, CHOLHDL, LDLDIRECT in the last 72 hours. Thyroid Function Tests: No results for input(s): TSH, T4TOTAL, FREET4, T3FREE, THYROIDAB in the last 72 hours. Anemia Panel: No results for input(s): VITAMINB12, FOLATE, FERRITIN, TIBC, IRON, RETICCTPCT in the last 72 hours. Sepsis Labs: No results for input(s): PROCALCITON, LATICACIDVEN in the last 168 hours.  Recent Results (from the past 240 hour(s))  SARS Coronavirus 2 (CEPHEID - Performed in [place] hospital lab), Hosp Order     Status: None   Collection Time: 10/07/18 10:28 PM  Result Value Ref Range Status   SARS Coronavirus 2 NEGATIVE NEGATIVE Final    Comment: (NOTE) If result is NEGATIVE SARS-CoV-2 target nucleic acids are NOT DETECTED. The SARS-CoV-2 RNA is generally detectable in upper and lower  respiratory specimens during the acute phase of infection. The lowest  concentration of SARS-CoV-2 viral copies this assay can detect is 250  copies / mL. A negative result does not preclude SARS-CoV-2 infection  and should not be used as the sole basis for treatment or other  patient management decisions.  A negative result may occur with  improper specimen collection / handling, submission of specimen other  than nasopharyngeal swab, presence of viral mutation(s) within the  areas targeted by this assay, and inadequate number of viral copies  (<250 copies / mL). A negative result must be combined with clinical   observations, patient history, and epidemiological information. If result is POSITIVE SARS-CoV-2 target nucleic acids are DETECTED. The SARS-CoV-2 RNA is generally detectable in upper and lower  respiratory specimens dur ing the acute phase of infection.  Positive  results are indicative of active infection with SARS-CoV-2.  Clinical  correlation with patient history and other diagnostic information is  necessary to determine patient infection status.  Positive results do  not rule out bacterial infection or co-infection with other viruses. If result is PRESUMPTIVE POSTIVE SARS-CoV-2 nucleic acids MAY BE PRESENT.   A presumptive positive result was obtained on the submitted specimen  and confirmed on repeat testing.  While 2019 novel coronavirus  (SARS-CoV-2) nucleic acids may be present in the submitted sample  additional confirmatory testing may be necessary for epidemiological  and / or clinical management purposes  to differentiate between  SARS-CoV-2 and other Sarbecovirus currently known to infect humans.  If clinically indicated additional testing with an alternate test  methodology [id number]) is advised. The SARS-CoV-2 RNA is generally  detectable in upper and lower respiratory sp ecimens during the acute  phase of infection. The expected result is Negative. Fact Sheet for Patients:  StrictlyIdeas.no Fact Sheet for Healthcare Providers: BankingDealers.co.za This test is not yet approved or cleared by the [place] FDA and has been authorized for detection and/or diagnosis of SARS-CoV-2 by FDA under an Emergency Use Authorization (EUA).  This EUA will remain in effect (meaning this test can be used) for the duration of the COVID-19 declaration under Section 564(b)(1) of the Act, 21 U.S.C. section 360bbb-3(b)(1), unless the authorization is terminated or revoked sooner. Performed at [facility], [place]  [address] [place], [place] [address]   Culture, blood (Routine X 2) w Reflex to ID Panel     Status: Abnormal (Preliminary result)   Collection Time: 10/08/18  8:54 AM  Result Value Ref Range Status   Specimen Description   Final    LEFT ANTECUBITAL Performed at [facility] [address] [place], [place] [address]    Special Requests   Final    BOTTLES DRAWN AEROBIC AND ANAEROBIC Blood Culture adequate volume Performed at [facility] [address], [place], [place] [address]    Culture  Setup Time   Final    GRAM POSITIVE COCCI IN BOTH AEROBIC AND ANAEROBIC BOTTLES CRITICAL RESULT CALLED TO, READ BACK BY AND VERIFIED WITH: M. RENZ PHARMD, AT 8676 10/09/18 BY D. VANHOOK Performed at  [facility], [place] [address], [place], [place] [address]    Culture METHICILLIN RESISTANT STAPHYLOCOCCUS AUREUS (A)  Final   Report Status PENDING  Incomplete   Organism ID, Bacteria METHICILLIN RESISTANT STAPHYLOCOCCUS AUREUS  Final      Susceptibility   Methicillin resistant staphylococcus aureus - MIC*    CIPROFLOXACIN >=8 RESISTANT Resistant     ERYTHROMYCIN >=8 RESISTANT Resistant     GENTAMICIN <=0.5 SENSITIVE Sensitive     OXACILLIN RESISTANT Resistant     TETRACYCLINE <=1 SENSITIVE Sensitive     VANCOMYCIN <=0.5 SENSITIVE Sensitive     TRIMETH/SULFA <=10 SENSITIVE Sensitive     CLINDAMYCIN <=0.25 SENSITIVE Sensitive     RIFAMPIN <=0.5 SENSITIVE Sensitive     Inducible Clindamycin NEGATIVE Sensitive     * METHICILLIN RESISTANT STAPHYLOCOCCUS AUREUS  Blood Culture ID Panel (Reflexed)     Status: Abnormal   Collection Time: 10/08/18  8:54 AM  Result Value Ref Range Status   Enterococcus species NOT DETECTED NOT DETECTED Final   Listeria monocytogenes NOT DETECTED NOT DETECTED Final   Staphylococcus species DETECTED (A) NOT DETECTED Final    Comment: CRITICAL RESULT CALLED TO, READ BACK BY AND VERIFIED WITH: M. RENZ PHARMD, AT 0706 10/09/18 BY  D. VANHOOK    Staphylococcus aureus (BCID) DETECTED (A) NOT DETECTED Final    Comment: Methicillin (oxacillin)-resistant Staphylococcus aureus (MRSA). MRSA is predictably resistant to beta-lactam antibiotics (except ceftaroline). Preferred therapy is vancomycin unless clinically contraindicated. Patient requires contact precautions if  hospitalized. CRITICAL RESULT CALLED TO, READ BACK BY AND VERIFIED WITH: M. RENZ PHARMD, AT 0160 10/09/18 BY D. VANHOOK    Methicillin resistance DETECTED (A) NOT DETECTED Final    Comment: CRITICAL RESULT CALLED TO, READ BACK BY AND VERIFIED WITH: M. RENZ PHARMD, AT 0706 10/09/18 BY D. VANHOOK    Streptococcus species NOT DETECTED NOT DETECTED Final   Streptococcus agalactiae NOT DETECTED NOT DETECTED Final   Streptococcus pneumoniae NOT DETECTED NOT DETECTED Final   Streptococcus pyogenes NOT DETECTED NOT DETECTED Final   Acinetobacter baumannii NOT DETECTED NOT DETECTED Final   Enterobacteriaceae species NOT DETECTED NOT DETECTED Final   Enterobacter cloacae complex NOT DETECTED NOT DETECTED Final   Escherichia coli NOT DETECTED NOT DETECTED Final   Klebsiella oxytoca NOT DETECTED NOT DETECTED Final   Klebsiella pneumoniae NOT DETECTED NOT DETECTED Final   Proteus species NOT DETECTED NOT DETECTED Final   Serratia marcescens NOT DETECTED NOT DETECTED Final   Haemophilus influenzae NOT DETECTED NOT DETECTED Final   Neisseria meningitidis NOT DETECTED NOT DETECTED Final   Pseudomonas aeruginosa NOT DETECTED NOT DETECTED Final   Candida albicans NOT DETECTED NOT DETECTED Final   Candida glabrata NOT DETECTED NOT DETECTED Final   Candida krusei NOT DETECTED NOT DETECTED Final   Candida parapsilosis NOT DETECTED NOT DETECTED Final   Candida tropicalis NOT DETECTED NOT DETECTED Final    Comment: Performed at [facility], [place] [address], [place], [place] [address]  Culture, blood (Routine X 2) w Reflex to ID Panel     Status: Abnormal   Collection  Time: 10/08/18  8:59 AM  Result Value Ref Range Status   Specimen Description   Final    RIGHT ANTECUBITAL Performed at [facility] [address], [place], [place] [address]    Special Requests   Final    BOTTLES DRAWN AEROBIC AND ANAEROBIC Blood Culture adequate volume Performed at [facility] [address] [place],  [place] [address]    Culture  Setup Time   Final    GRAM POSITIVE COCCI IN CLUSTERS ANAEROBIC BOTTLE ONLY CRITICAL VALUE NOTED.  VALUE IS CONSISTENT WITH PREVIOUSLY REPORTED AND CALLED VALUE.    Culture (A)  Final    STAPHYLOCOCCUS AUREUS SUSCEPTIBILITIES PERFORMED ON PREVIOUS CULTURE WITHIN THE LAST 5 DAYS. Performed at [facility], [place] [address], [place], [place] [address]    Report Status 10/11/2018 FINAL  Final  Aerobic/Anaerobic Culture (surgical/deep wound)     Status: None (Preliminary result)   Collection Time: 10/08/18 12:33 PM  Result Value Ref Range Status   Specimen Description   Final    ABSCESS SPINE Performed at [facility] [address] [place], [place] [address]    Special Requests   Final    NONE Performed at [facility], [place] [address] [place], [place] [address]    Gram Stain   Final    ABUNDANT WBC PRESENT,BOTH PMN AND MONONUCLEAR RARE GRAM POSITIVE COCCI Performed at [facility], [place] [address] [place], [place] [address]    Culture   Final    FEW METHICILLIN RESISTANT STAPHYLOCOCCUS AUREUS NO ANAEROBES ISOLATED; CULTURE IN PROGRESS FOR 5 DAYS    Report Status PENDING  Incomplete   Organism ID, Bacteria METHICILLIN RESISTANT STAPHYLOCOCCUS AUREUS  Final      Susceptibility   Methicillin resistant staphylococcus aureus - MIC*    CIPROFLOXACIN >=8 RESISTANT Resistant     ERYTHROMYCIN >=8 RESISTANT Resistant     GENTAMICIN <=0.5 SENSITIVE Sensitive     OXACILLIN >=4 RESISTANT Resistant     TETRACYCLINE <=1 SENSITIVE Sensitive      VANCOMYCIN 1 SENSITIVE Sensitive     TRIMETH/SULFA <=10 SENSITIVE Sensitive     CLINDAMYCIN <=0.25 SENSITIVE Sensitive     RIFAMPIN <=0.5 SENSITIVE Sensitive     Inducible Clindamycin NEGATIVE Sensitive     * FEW METHICILLIN RESISTANT STAPHYLOCOCCUS AUREUS  Culture, blood (Routine X 2) w Reflex to ID Panel     Status: None (Preliminary result)   Collection Time: 10/09/18 10:35 AM  Result Value Ref Range Status   Specimen Description   Final    BLOOD LEFT ARM Performed at [facility] [address] [place], [place] [address]    Special Requests   Final    BAA BCAV Performed at [facility] [address], [place], [place] [address]    Culture   Final    NO GROWTH 1 DAY Performed at [facility], [place] [address], [place], [place] [address]    Report Status PENDING  Incomplete  Culture, blood (Routine X 2) w Reflex to ID Panel     Status: None (Preliminary result)   Collection Time: 10/09/18 10:41 AM  Result Value Ref Range Status   Specimen Description   Final    BLOOD RIGHT HAND Performed at [facility] [address] [place], [place] [address]    Special Requests   Final    BAA BCAV Performed at [facility] [address], [place], [place] [address]    Culture   Final    NO GROWTH 1 DAY Performed at [facility], [place] [address] [place], [place] [address]    Report Status PENDING  Incomplete         Radiology Studies: Dg Abd 2 Views  Result Date: 10/09/2018 CLINICAL DATA:  Pain. EXAM: ABDOMEN -  2 VIEW COMPARISON:  CT dated 10/08/2018 FINDINGS: There is a large amount of stool in the right hemicolon. There is gaseous distention of multiple loops of colon. There is no pneumatosis or evidence of free air. There is a relative paucity of bowel gas at the level of the rectum. Multiple old left-sided rib fractures are noted, status post fixation. Many of the wires are fractured.  There are few lucencies overlying the liver, likely artifact. IMPRESSION: 1. Large amount of stool in the right hemicolon/cecum. There is mild gaseous distention of the transverse colon and splenic flexure. 2. Relative paucity of bowel gas at the level of the rectum. 3. Nonobstructive bowel gas pattern Electronically Signed   By: Constance Holster M.D.   On: 10/09/2018 20:04        Scheduled Meds: . amitriptyline  150 mg Oral QHS  . aspirin EC  325 mg Oral Daily  . citalopram  40 mg Oral Daily  . gabapentin  300 mg Oral TID  . lisinopril  10 mg Oral Daily   And  . hydrochlorothiazide  12.5 mg Oral Daily  . nicotine  14 mg Transdermal Daily  . polyethylene glycol  17 g Oral BID  . rosuvastatin  40 mg Oral Daily  . senna-docusate  1 tablet Oral BID   Continuous Infusions: . vancomycin       LOS: 3 days    Time spent: 25 mins.More than 50% of that time was spent in counseling and/or coordination of care.      Shelly Coss, MD [facility] Pager [phone]  If 7PM-7AM, please contact night-coverage [email or web address] Password [email or web address] 10/11/2018, 11:24 AM

## 2018-10-11 NOTE — Progress Notes (Signed)
Patient returned clear fluid rectally post mineral oil enema but no stool.  Donne Hazel, RN

## 2018-10-11 NOTE — Care Management Important Message (Signed)
Important Message  Patient Details IM Letter given Velva Harman RN to present to the Patient Name: Howard Fox MRN: [id number] Date of Birth: 31-Mar-1961   Medicare Important Message Given:  Yes    Kerin Salen 10/11/2018, 11:49 AM

## 2018-10-11 NOTE — Progress Notes (Signed)
Pharmacy Antibiotic Note  1. Howard Fox is a 58 y.o. 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  Pharmacy has been consulted for vancomycin dosing.  BCID 1 of 4 bottles growing MRSA. WBC 12.5, SCr 0.88. AF.  10/11/2018  D#3 vancomycin for MRSA discitis/osteo and MRSA bacteremia Vancomycin trough 13 mcg/ml on vanc 1250 mg q12 WBC WNL AF SCr 0.95 5/20 echo no veg Repeat BCx 5/19 no growth x 1 day CT abd/pelvis unremarkable Hx IVDU  Plan: Increase vancomycin to 1500 mg IV q12 F/u renal function, WBC, temp, culture data F/u ID consult recs  Height: 5\' 11"  (180.3 cm) Weight: 240 lb (108.9 kg) IBW/kg (Calculated) : 75.3  Temp (24hrs), Avg:98.3 F (36.8 C), Min:98 F (36.7 C), Max:98.8 F (37.1 C)  Recent Labs  Lab 10/07/18 2150 10/09/18 0419 10/10/18 0451 10/11/18 1023  WBC 10.4 12.5* 9.0  --   CREATININE 0.78 0.88 1.01 0.95  VANCOTROUGH  --   --   --  13*    Estimated Creatinine Clearance: 107.6 mL/min (by C-G formula based on SCr of 0.95 mg/dL).    Allergies  Allergen Reactions  . Codeine Itching  . Dilaudid [Hydromorphone Hcl] Itching   Antimicrobials this admission: 5/19 vanc>> Dose adjustments this admission: 5/21 1023am VT = 13 on 1250 q12 incr to 1500 q12 Microbiology results: 5/18 abscess spine: few MRSA MIC vanc 1 5/18 BCx2:  BCID 1 of 4 bottles. Aerobic MRSA, now in 2/4 bottles Vanc MIC 0.5 5/17 SARS CoV2: negative 5/19 BCx2: ngtd  Thank you for allowing pharmacy to be a part of this patient's care.  Eudelia Bunch, Pharm.D 10/11/2018 11:13 AM

## 2018-10-11 NOTE — Progress Notes (Signed)
Dr Tawanna Solo paged as requested regarding patient has not had a BM. Donne Hazel, RN

## 2018-10-11 NOTE — Progress Notes (Signed)
Subjective: Complaining of left IV hurting   Antibiotics:  Anti-infectives (From admission, onward)   Start     Dose/Rate Route Frequency Ordered Stop   10/11/18 2200  vancomycin (VANCOCIN) 1,500 mg in sodium chloride 0.9 % 500 mL IVPB     1,500 mg 250 mL/hr over 120 Minutes Intravenous Every 12 hours 10/11/18 1102     10/09/18 2200  vancomycin (VANCOCIN) 1,250 mg in sodium chloride 0.9 % 250 mL IVPB  Status:  Discontinued     1,250 mg 166.7 mL/hr over 90 Minutes Intravenous Every 12 hours 10/09/18 0803 10/11/18 1102   10/09/18 0800  vancomycin (VANCOCIN) 2,500 mg in sodium chloride 0.9 % 500 mL IVPB     2,500 mg 250 mL/hr over 120 Minutes Intravenous  Once 10/09/18 0745 10/09/18 1154   10/08/18 0845  vancomycin (VANCOCIN) IVPB 1000 mg/200 mL premix  Status:  Discontinued     1,000 mg 200 mL/hr over 60 Minutes Intravenous  Once 10/08/18 0835 10/08/18 0908      Medications: Scheduled Meds: . amitriptyline  150 mg Oral QHS  . aspirin EC  325 mg Oral Daily  . citalopram  40 mg Oral Daily  . gabapentin  300 mg Oral TID  . lisinopril  10 mg Oral Daily   And  . hydrochlorothiazide  12.5 mg Oral Daily  . nicotine  14 mg Transdermal Daily  . polyethylene glycol  17 g Oral BID  . rosuvastatin  40 mg Oral Daily  . senna-docusate  1 tablet Oral BID   Continuous Infusions: . vancomycin     PRN Meds:.ALPRAZolam, carisoprodol, morphine injection, Muscle Rub, oxyCODONE-acetaminophen **AND** oxyCODONE    Objective: Weight change:   Intake/Output Summary (Last 24 hours) at 10/11/2018 1724 Last data filed at 10/11/2018 1500 Gross per 24 hour  Intake 2359.18 ml  Output 1400 ml  Net 959.18 ml   Blood pressure 119/73, pulse 72, temperature 98.4 F (36.9 C), temperature source Oral, resp. rate 16, height 5\' 11"  (1.803 m), weight 108.9 kg, SpO2 99 %. Temp:  [98.1 F (36.7 C)-98.8 F (37.1 C)] 98.4 F (36.9 C) (05/21 1444) Pulse Rate:  [72-87] 72 (05/21 1444) Resp:   [16-18] 16 (05/21 1444) BP: (102-119)/(73-86) 119/73 (05/21 1444) SpO2:  [96 %-99 %] 99 % (05/21 1444)  Physical Exam: General: Alert and awake, oriented x3, not in any acute distress. HEENT: anicteric sclera, EOMI CVS regular rate, normal  No mgr Chest: , no wheezing, no respiratory distress ctab, he has discomfort when getting up out of bed on the left side Abdomen: soft non-distended,  Extremities: no edema or deformity noted bilaterally Skin: no rashes, left iV without erythema or cord Neuro: nonfocal  CBC:    BMET Recent Labs    10/09/18 0419 10/10/18 0451 10/11/18 1023  NA 132* 135  --   K 4.1 4.6  --   CL 98 100  --   CO2 23 25  --   GLUCOSE 115* 111*  --   BUN 18 18  --   CREATININE 0.88 1.01 0.95  CALCIUM 9.1 9.2  --      Liver Panel  No results for input(s): PROT, ALBUMIN, AST, ALT, ALKPHOS, BILITOT, BILIDIR, IBILI in the last 72 hours.     Sedimentation Rate Recent Labs    10/09/18 0419  ESRSEDRATE 59*   C-Reactive Protein Recent Labs    10/09/18 0419  CRP 11.2*    Micro Results: Recent Results (from  the past 720 hour(s))  SARS Coronavirus 2 (CEPHEID - Performed in [facility] hospital lab), Hosp Order     Status: None   Collection Time: 10/07/18 10:28 PM  Result Value Ref Range Status   SARS Coronavirus 2 NEGATIVE NEGATIVE Final    Comment: (NOTE) If result is NEGATIVE SARS-CoV-2 target nucleic acids are NOT DETECTED. The SARS-CoV-2 RNA is generally detectable in upper and lower  respiratory specimens during the acute phase of infection. The lowest  concentration of SARS-CoV-2 viral copies this assay can detect is 250  copies / mL. A negative result does not preclude SARS-CoV-2 infection  and should not be used as the sole basis for treatment or other  patient management decisions.  A negative result may occur with  improper specimen collection / handling, submission of specimen other  than nasopharyngeal swab, presence of viral  mutation(s) within the  areas targeted by this assay, and inadequate number of viral copies  (<250 copies / mL). A negative result must be combined with clinical  observations, patient history, and epidemiological information. If result is POSITIVE SARS-CoV-2 target nucleic acids are DETECTED. The SARS-CoV-2 RNA is generally detectable in upper and lower  respiratory specimens dur ing the acute phase of infection.  Positive  results are indicative of active infection with SARS-CoV-2.  Clinical  correlation with patient history and other diagnostic information is  necessary to determine patient infection status.  Positive results do  not rule out bacterial infection or co-infection with other viruses. If result is PRESUMPTIVE POSTIVE SARS-CoV-2 nucleic acids MAY BE PRESENT.   A presumptive positive result was obtained on the submitted specimen  and confirmed on repeat testing.  While 2019 novel coronavirus  (SARS-CoV-2) nucleic acids may be present in the submitted sample  additional confirmatory testing may be necessary for epidemiological  and / or clinical management purposes  to differentiate between  SARS-CoV-2 and other Sarbecovirus currently known to infect humans.  If clinically indicated additional testing with an alternate test  methodology [phone]) is advised. The SARS-CoV-2 RNA is generally  detectable in upper and lower respiratory sp ecimens during the acute  phase of infection. The expected result is Negative. Fact Sheet for Patients:  StrictlyIdeas.no Fact Sheet for Healthcare Providers: BankingDealers.co.za This test is not yet approved or cleared by the [place] FDA and has been authorized for detection and/or diagnosis of SARS-CoV-2 by FDA under an Emergency Use Authorization (EUA).  This EUA will remain in effect (meaning this test can be used) for the duration of the COVID-19 declaration under Section 564(b)(1)  of the Act, 21 U.S.C. section 360bbb-3(b)(1), unless the authorization is terminated or revoked sooner. Performed at [facility], [place] [address], [place], [place] [address]   Culture, blood (Routine X 2) w Reflex to ID Panel     Status: Abnormal (Preliminary result)   Collection Time: 10/08/18  8:54 AM  Result Value Ref Range Status   Specimen Description   Final    LEFT ANTECUBITAL Performed at [facility] [address], [place], [place] [address]    Special Requests   Final    BOTTLES DRAWN AEROBIC AND ANAEROBIC Blood Culture adequate volume Performed at [facility] [address], [place], [place] [address]    Culture  Setup Time   Final    GRAM POSITIVE COCCI IN BOTH AEROBIC AND ANAEROBIC BOTTLES CRITICAL RESULT CALLED TO, READ BACK BY AND VERIFIED WITH: M. RENZ PHARMD, AT 5397 10/09/18  BY Rush Landmark Performed at [facility], [place] [address] [place], [place] [address]    Culture METHICILLIN RESISTANT STAPHYLOCOCCUS AUREUS (A)  Final   Report Status PENDING  Incomplete   Organism ID, Bacteria METHICILLIN RESISTANT STAPHYLOCOCCUS AUREUS  Final      Susceptibility   Methicillin resistant staphylococcus aureus - MIC*    CIPROFLOXACIN >=8 RESISTANT Resistant     ERYTHROMYCIN >=8 RESISTANT Resistant     GENTAMICIN <=0.5 SENSITIVE Sensitive     OXACILLIN RESISTANT Resistant     TETRACYCLINE <=1 SENSITIVE Sensitive     VANCOMYCIN <=0.5 SENSITIVE Sensitive     TRIMETH/SULFA <=10 SENSITIVE Sensitive     CLINDAMYCIN <=0.25 SENSITIVE Sensitive     RIFAMPIN <=0.5 SENSITIVE Sensitive     Inducible Clindamycin NEGATIVE Sensitive     * METHICILLIN RESISTANT STAPHYLOCOCCUS AUREUS  Blood Culture ID Panel (Reflexed)     Status: Abnormal   Collection Time: 10/08/18  8:54 AM  Result Value Ref Range Status   Enterococcus species NOT DETECTED NOT DETECTED Final   Listeria monocytogenes NOT DETECTED NOT DETECTED Final    Staphylococcus species DETECTED (A) NOT DETECTED Final    Comment: CRITICAL RESULT CALLED TO, READ BACK BY AND VERIFIED WITH: M. RENZ PHARMD, AT 0706 10/09/18 BY D. VANHOOK    Staphylococcus aureus (BCID) DETECTED (A) NOT DETECTED Final    Comment: Methicillin (oxacillin)-resistant Staphylococcus aureus (MRSA). MRSA is predictably resistant to beta-lactam antibiotics (except ceftaroline). Preferred therapy is vancomycin unless clinically contraindicated. Patient requires contact precautions if  hospitalized. CRITICAL RESULT CALLED TO, READ BACK BY AND VERIFIED WITH: M. RENZ PHARMD, AT 0539 10/09/18 BY D. VANHOOK    Methicillin resistance DETECTED (A) NOT DETECTED Final    Comment: CRITICAL RESULT CALLED TO, READ BACK BY AND VERIFIED WITH: M. RENZ PHARMD, AT 0706 10/09/18 BY D. VANHOOK    Streptococcus species NOT DETECTED NOT DETECTED Final   Streptococcus agalactiae NOT DETECTED NOT DETECTED Final   Streptococcus pneumoniae NOT DETECTED NOT DETECTED Final   Streptococcus pyogenes NOT DETECTED NOT DETECTED Final   Acinetobacter baumannii NOT DETECTED NOT DETECTED Final   Enterobacteriaceae species NOT DETECTED NOT DETECTED Final   Enterobacter cloacae complex NOT DETECTED NOT DETECTED Final   Escherichia coli NOT DETECTED NOT DETECTED Final   Klebsiella oxytoca NOT DETECTED NOT DETECTED Final   Klebsiella pneumoniae NOT DETECTED NOT DETECTED Final   Proteus species NOT DETECTED NOT DETECTED Final   Serratia marcescens NOT DETECTED NOT DETECTED Final   Haemophilus influenzae NOT DETECTED NOT DETECTED Final   Neisseria meningitidis NOT DETECTED NOT DETECTED Final   Pseudomonas aeruginosa NOT DETECTED NOT DETECTED Final   Candida albicans NOT DETECTED NOT DETECTED Final   Candida glabrata NOT DETECTED NOT DETECTED Final   Candida krusei NOT DETECTED NOT DETECTED Final   Candida parapsilosis NOT DETECTED NOT DETECTED Final   Candida tropicalis NOT DETECTED NOT DETECTED Final    Comment:  Performed at [facility], [place] [address], [place], [place] [address]  Culture, blood (Routine X 2) w Reflex to ID Panel     Status: Abnormal   Collection Time: 10/08/18  8:59 AM  Result Value Ref Range Status   Specimen Description   Final    RIGHT ANTECUBITAL Performed at [facility] [address] [place], [place] [address]    Special Requests   Final    BOTTLES DRAWN AEROBIC AND ANAEROBIC Blood Culture adequate volume Performed at [facility],  [place] [address] [place], [place] [address]    Culture  Setup Time   Final    GRAM POSITIVE COCCI IN CLUSTERS ANAEROBIC BOTTLE ONLY CRITICAL VALUE NOTED.  VALUE IS CONSISTENT WITH PREVIOUSLY REPORTED AND CALLED VALUE.    Culture (A)  Final    STAPHYLOCOCCUS AUREUS SUSCEPTIBILITIES PERFORMED ON PREVIOUS CULTURE WITHIN THE LAST 5 DAYS. Performed at [facility], [place] [address] [place], [place] [address]    Report Status 10/11/2018 FINAL  Final  Aerobic/Anaerobic Culture (surgical/deep wound)     Status: None (Preliminary result)   Collection Time: 10/08/18 12:33 PM  Result Value Ref Range Status   Specimen Description   Final    ABSCESS SPINE Performed at [facility] [address], [place], [place] [address]    Special Requests   Final    NONE Performed at [facility], [place] [address], [place], [place] [address]    Gram Stain   Final    ABUNDANT WBC PRESENT,BOTH PMN AND MONONUCLEAR RARE GRAM POSITIVE COCCI Performed at [facility], [place] [address], [place], [place] [address]    Culture   Final    FEW METHICILLIN RESISTANT STAPHYLOCOCCUS AUREUS NO ANAEROBES ISOLATED; CULTURE IN PROGRESS FOR 5 DAYS    Report Status PENDING  Incomplete   Organism ID, Bacteria METHICILLIN RESISTANT STAPHYLOCOCCUS AUREUS  Final      Susceptibility   Methicillin resistant staphylococcus aureus - MIC*    CIPROFLOXACIN >=8 RESISTANT Resistant      ERYTHROMYCIN >=8 RESISTANT Resistant     GENTAMICIN <=0.5 SENSITIVE Sensitive     OXACILLIN >=4 RESISTANT Resistant     TETRACYCLINE <=1 SENSITIVE Sensitive     VANCOMYCIN 1 SENSITIVE Sensitive     TRIMETH/SULFA <=10 SENSITIVE Sensitive     CLINDAMYCIN <=0.25 SENSITIVE Sensitive     RIFAMPIN <=0.5 SENSITIVE Sensitive     Inducible Clindamycin NEGATIVE Sensitive     * FEW METHICILLIN RESISTANT STAPHYLOCOCCUS AUREUS  Culture, blood (Routine X 2) w Reflex to ID Panel     Status: None (Preliminary result)   Collection Time: 10/09/18 10:35 AM  Result Value Ref Range Status   Specimen Description   Final    BLOOD LEFT ARM Performed at [facility] [address] [place], [place] [address]    Special Requests   Final    BAA BCAV Performed at [facility] [address] [place], [place] [address]    Culture   Final    NO GROWTH 2 DAYS Performed at [facility] [address], [place], [place] [address]    Report Status PENDING  Incomplete  Culture, blood (Routine X 2) w Reflex to ID Panel     Status: None (Preliminary result)   Collection Time: 10/09/18 10:41 AM  Result Value Ref Range Status   Specimen Description   Final    BLOOD RIGHT HAND Performed at [facility] [address] [place], [place] [address]    Special Requests   Final    BAA BCAV Performed at [facility] [address], [place], [place] [address]    Culture   Final    NO GROWTH 2 DAYS Performed at [facility] [address], [place], [place] [address]    Report Status PENDING  Incomplete    Studies/Results: No results found.    Assessment/Plan:  INTERVAL HISTORY:   Echo without vegetations  Principal Problem:   Discitis Active Problems:   Essential hypertension   Hyperlipidemia, mixed   Hepatitis C, chronic (HCC)   Tobacco use disorder   Prediabetes   Bacteremia due to Gram-positive bacteria    Back pain    Howard Fox is a 58 y.o. male with  T 10-11 diskitis, osteomyelitis and now found to have MRSA bacteremia. He has Chronic HCV without hepatic coma and admitted to me prior injection drug use though claims he has been clean since 2008 to me but other story to primary.  #1      [facility] Staphylococcus aureus bacteremia   Staphylococcus aureus bacteremia (SAB) is associated with a high rate of complications and mortality.  Specific aspects of clinical management are critical to optimizing the outcome of patients with SAB.  Therefore, the [facility] Antimicrobial Management Team [facility]) has initiated an intervention aimed at improving the management of SAB at [facility].  To do so, Infectious Diseases physicians are providing an evidence-based consult for the management of all patients with SAB.     Yes No Comments  Perform follow-up blood cultures (even if the patient is afebrile) to ensure clearance of bacteremia [x]  []  Repeated and NO GROWTH TO DATE 2 days  Remove vascular catheter and obtain follow-up blood cultures after the removal of the catheter []  []  DO NOT PLACE PICC cultures   Perform echocardiography to evaluate for endocarditis (transthoracic ECHO is 40-50% sensitive, TEE is > 90% sensitive) [x]  []  Please keep in mind, that neither test can definitively EXCLUDE endocarditis, and that should clinical suspicion remain high for endocarditis the patient should then still be treated with an "endocarditis" duration of therapy = 6 weeks  Consult electrophysiologist to evaluate implanted cardiac device (pacemaker, ICD) []  []  NA  Ensure source control []  []  Have all abscesses been drained effectively? Have deep seeded infections (septic joints or osteomyelitis) had appropriate surgical debridement?  ? IVDU as source. Spine infected  Investigate for "metastatic" sites of infection []  []  Does the patient have ANY symptom or physical exam finding  that would suggest a deeper infection (back or neck pain that may be suggestive of vertebral osteomyelitis or epidural abscess, muscle pain that could be a symptom of pyomyositis)?  Keep in mind that for deep seeded infections MRI imaging with contrast is preferred rather than other often insensitive tests such as plain x-rays, especially early in a patient's presentation. Spine is only clear site  Change antibiotic therapy to vancomycin []  []  Beta-lactam antibiotics are preferred for MSSA due to higher cure rates.   If on Vancomycin, goal trough should be 15 - 20 mcg/mL  Estimated duration of IV antibiotic therapy:  8 weeks  May try to give zyvox as part of this though []  []  Consult case management for probably prolonged outpatient IV antibiotic therapy   #2 Vertebral osteomyelitis: duration as above. 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 I have concern he is still actively using  I MAY WANT TO HAVE PART OF HIS REGIMEN AS ORAL ZYVOX THOUGH HE IS ON several SEROTONERIC agents and will need to confer with pharmacy.         LOS: 3 days  Alcide Evener 10/11/2018, 5:24 PM

## 2018-10-12 ENCOUNTER — Inpatient Hospital Stay (HOSPITAL_COMMUNITY): Payer: Medicare Other

## 2018-10-12 LAB — CREATININE, SERUM
Creatinine, Ser: 0.82 mg/dL (ref 0.61–1.24)
GFR calc Af Amer: >60 mL/min (ref >60)
GFR calc non Af Amer: >60 mL/min (ref >60)

## 2018-10-12 MED ORDER — MAGNESIUM HYDROXIDE 400 MG/5ML PO SUSP
30.0000 mL | Freq: Once | ORAL | Status: AC
  Administered 2018-10-12: 30 mL via ORAL
  Filled 2018-10-12: qty 30

## 2018-10-12 MED ORDER — FLEET ENEMA 7-19 GM/118ML RE ENEM
1.0000 | ENEMA | Freq: Once | RECTAL | Status: AC
  Administered 2018-10-12: 1 via RECTAL
  Filled 2018-10-12: qty 1

## 2018-10-12 NOTE — Progress Notes (Signed)
Subjective: ]he is very stressed out and his mother does not understand why he is still in the hospital  Antibiotics:  Anti-infectives (From admission, onward)   Start     Dose/Rate Route Frequency Ordered Stop   10/11/18 2200  vancomycin (VANCOCIN) 1,500 mg in sodium chloride 0.9 % 500 mL IVPB     1,500 mg 250 mL/hr over 120 Minutes Intravenous Every 12 hours 10/11/18 1102     10/09/18 2200  vancomycin (VANCOCIN) 1,250 mg in sodium chloride 0.9 % 250 mL IVPB  Status:  Discontinued     1,250 mg 166.7 mL/hr over 90 Minutes Intravenous Every 12 hours 10/09/18 0803 10/11/18 1102   10/09/18 0800  vancomycin (VANCOCIN) 2,500 mg in sodium chloride 0.9 % 500 mL IVPB     2,500 mg 250 mL/hr over 120 Minutes Intravenous  Once 10/09/18 0745 10/09/18 1154   10/08/18 0845  vancomycin (VANCOCIN) IVPB 1000 mg/200 mL premix  Status:  Discontinued     1,000 mg 200 mL/hr over 60 Minutes Intravenous  Once 10/08/18 0835 10/08/18 0908      Medications: Scheduled Meds: . amitriptyline  150 mg Oral QHS  . aspirin EC  325 mg Oral Daily  . citalopram  40 mg Oral Daily  . gabapentin  300 mg Oral TID  . lisinopril  10 mg Oral Daily   And  . hydrochlorothiazide  12.5 mg Oral Daily  . nicotine  14 mg Transdermal Daily  . polyethylene glycol  17 g Oral BID  . rosuvastatin  40 mg Oral Daily  . senna-docusate  1 tablet Oral BID   Continuous Infusions: . vancomycin 1,500 mg (10/12/18 1029)   PRN Meds:.ALPRAZolam, carisoprodol, morphine injection, Muscle Rub, oxyCODONE-acetaminophen **AND** oxyCODONE    Objective: Weight change:   Intake/Output Summary (Last 24 hours) at 10/12/2018 1533 Last data filed at 10/12/2018 1421 Gross per 24 hour  Intake 2420 ml  Output 2700 ml  Net -280 ml   Blood pressure 139/90, pulse 79, temperature 98.2 F (36.8 C), temperature source Oral, resp. rate 18, height 5\' 11"  (1.803 m), weight 108.9 kg, SpO2 95 %. Temp:  [98.1 F (36.7 C)-98.6 F (37 C)] 98.2  F (36.8 C) (05/22 1406) Pulse Rate:  [76-83] 79 (05/22 1406) Resp:  [18] 18 (05/22 1406) BP: (135-151)/(80-90) 139/90 (05/22 1406) SpO2:  [95 %-97 %] 95 % (05/22 1406)  Physical Exam: General: Alert and awake, oriented x3, not in any acute distress. HEENT: anicteric sclera, EOMI CVS regular rate, normal  No mgr Chest: , no wheezing, no respiratory distress ctab, he has discomfort when getting up out of bed on the left side Abdomen: soft non-distended,  Extremities: no edema or deformity noted bilaterally Skin: no rashes, Neuro: nonfocal  CBC:    BMET Recent Labs    10/10/18 0451 10/11/18 1023 10/12/18 0428  NA 135  --   --   K 4.6  --   --   CL 100  --   --   CO2 25  --   --   GLUCOSE 111*  --   --   BUN 18  --   --   CREATININE 1.01 0.95 0.82  CALCIUM 9.2  --   --      Liver Panel  No results for input(s): PROT, ALBUMIN, AST, ALT, ALKPHOS, BILITOT, BILIDIR, IBILI in the last 72 hours.     Sedimentation Rate No results for input(s): ESRSEDRATE in the last 72 hours.  C-Reactive Protein No results for input(s): CRP in the last 72 hours.  Micro Results: Recent Results (from the past 720 hour(s))  SARS Coronavirus 2 (CEPHEID - Performed in [place] hospital lab), Hosp Order     Status: None   Collection Time: 10/07/18 10:28 PM  Result Value Ref Range Status   SARS Coronavirus 2 NEGATIVE NEGATIVE Final    Comment: (NOTE) If result is NEGATIVE SARS-CoV-2 target nucleic acids are NOT DETECTED. The SARS-CoV-2 RNA is generally detectable in upper and lower  respiratory specimens during the acute phase of infection. The lowest  concentration of SARS-CoV-2 viral copies this assay can detect is 250  copies / mL. A negative result does not preclude SARS-CoV-2 infection  and should not be used as the sole basis for treatment or other  patient management decisions.  A negative result may occur with  improper specimen collection / handling, submission of specimen  other  than nasopharyngeal swab, presence of viral mutation(s) within the  areas targeted by this assay, and inadequate number of viral copies  (<250 copies / mL). A negative result must be combined with clinical  observations, patient history, and epidemiological information. If result is POSITIVE SARS-CoV-2 target nucleic acids are DETECTED. The SARS-CoV-2 RNA is generally detectable in upper and lower  respiratory specimens dur ing the acute phase of infection.  Positive  results are indicative of active infection with SARS-CoV-2.  Clinical  correlation with patient history and other diagnostic information is  necessary to determine patient infection status.  Positive results do  not rule out bacterial infection or co-infection with other viruses. If result is PRESUMPTIVE POSTIVE SARS-CoV-2 nucleic acids MAY BE PRESENT.   A presumptive positive result was obtained on the submitted specimen  and confirmed on repeat testing.  While 2019 novel coronavirus  (SARS-CoV-2) nucleic acids may be present in the submitted sample  additional confirmatory testing may be necessary for epidemiological  and / or clinical management purposes  to differentiate between  SARS-CoV-2 and other Sarbecovirus currently known to infect humans.  If clinically indicated additional testing with an alternate test  methodology [phone]) is advised. The SARS-CoV-2 RNA is generally  detectable in upper and lower respiratory sp ecimens during the acute  phase of infection. The expected result is Negative. Fact Sheet for Patients:  StrictlyIdeas.no Fact Sheet for Healthcare Providers: BankingDealers.co.za This test is not yet approved or cleared by the [place] FDA and has been authorized for detection and/or diagnosis of SARS-CoV-2 by FDA under an Emergency Use Authorization (EUA).  This EUA will remain in effect (meaning this test can be used) for the duration  of the COVID-19 declaration under Section 564(b)(1) of the Act, 21 U.S.C. section 360bbb-3(b)(1), unless the authorization is terminated or revoked sooner. Performed at [facility], [place] [address], [place], [place] [address]   Culture, blood (Routine X 2) w Reflex to ID Panel     Status: Abnormal (Preliminary result)   Collection Time: 10/08/18  8:54 AM  Result Value Ref Range Status   Specimen Description   Final    LEFT ANTECUBITAL Performed at [facility] [address] [place], [place] [address]    Special Requests   Final    BOTTLES DRAWN AEROBIC AND ANAEROBIC Blood Culture adequate volume Performed at [facility] [address], [place], [place] [address]    Culture  Setup Time   Final    GRAM POSITIVE COCCI IN BOTH AEROBIC AND  ANAEROBIC BOTTLES CRITICAL RESULT CALLED TO, READ BACK BY AND VERIFIED WITH: M. RENZ PHARMD, AT 0706 10/09/18 BY Rush Landmark Performed at [facility], [place] [address] [place], [place] [address]    Culture METHICILLIN RESISTANT STAPHYLOCOCCUS AUREUS (A)  Final   Report Status PENDING  Incomplete   Organism ID, Bacteria METHICILLIN RESISTANT STAPHYLOCOCCUS AUREUS  Final      Susceptibility   Methicillin resistant staphylococcus aureus - MIC*    CIPROFLOXACIN >=8 RESISTANT Resistant     ERYTHROMYCIN >=8 RESISTANT Resistant     GENTAMICIN <=0.5 SENSITIVE Sensitive     OXACILLIN RESISTANT Resistant     TETRACYCLINE <=1 SENSITIVE Sensitive     VANCOMYCIN <=0.5 SENSITIVE Sensitive     TRIMETH/SULFA <=10 SENSITIVE Sensitive     CLINDAMYCIN <=0.25 SENSITIVE Sensitive     RIFAMPIN <=0.5 SENSITIVE Sensitive     Inducible Clindamycin NEGATIVE Sensitive     * METHICILLIN RESISTANT STAPHYLOCOCCUS AUREUS  Blood Culture ID Panel (Reflexed)     Status: Abnormal   Collection Time: 10/08/18  8:54 AM  Result Value Ref Range Status   Enterococcus species NOT DETECTED NOT DETECTED Final   Listeria  monocytogenes NOT DETECTED NOT DETECTED Final   Staphylococcus species DETECTED (A) NOT DETECTED Final    Comment: CRITICAL RESULT CALLED TO, READ BACK BY AND VERIFIED WITH: M. RENZ PHARMD, AT 0706 10/09/18 BY D. VANHOOK    Staphylococcus aureus (BCID) DETECTED (A) NOT DETECTED Final    Comment: Methicillin (oxacillin)-resistant Staphylococcus aureus (MRSA). MRSA is predictably resistant to beta-lactam antibiotics (except ceftaroline). Preferred therapy is vancomycin unless clinically contraindicated. Patient requires contact precautions if  hospitalized. CRITICAL RESULT CALLED TO, READ BACK BY AND VERIFIED WITH: M. RENZ PHARMD, AT 1601 10/09/18 BY D. VANHOOK    Methicillin resistance DETECTED (A) NOT DETECTED Final    Comment: CRITICAL RESULT CALLED TO, READ BACK BY AND VERIFIED WITH: M. RENZ PHARMD, AT 0706 10/09/18 BY D. VANHOOK    Streptococcus species NOT DETECTED NOT DETECTED Final   Streptococcus agalactiae NOT DETECTED NOT DETECTED Final   Streptococcus pneumoniae NOT DETECTED NOT DETECTED Final   Streptococcus pyogenes NOT DETECTED NOT DETECTED Final   Acinetobacter baumannii NOT DETECTED NOT DETECTED Final   Enterobacteriaceae species NOT DETECTED NOT DETECTED Final   Enterobacter cloacae complex NOT DETECTED NOT DETECTED Final   Escherichia coli NOT DETECTED NOT DETECTED Final   Klebsiella oxytoca NOT DETECTED NOT DETECTED Final   Klebsiella pneumoniae NOT DETECTED NOT DETECTED Final   Proteus species NOT DETECTED NOT DETECTED Final   Serratia marcescens NOT DETECTED NOT DETECTED Final   Haemophilus influenzae NOT DETECTED NOT DETECTED Final   Neisseria meningitidis NOT DETECTED NOT DETECTED Final   Pseudomonas aeruginosa NOT DETECTED NOT DETECTED Final   Candida albicans NOT DETECTED NOT DETECTED Final   Candida glabrata NOT DETECTED NOT DETECTED Final   Candida krusei NOT DETECTED NOT DETECTED Final   Candida parapsilosis NOT DETECTED NOT DETECTED Final   Candida  tropicalis NOT DETECTED NOT DETECTED Final    Comment: Performed at [facility], [place] [address], [place], [place] [address]  Culture, blood (Routine X 2) w Reflex to ID Panel     Status: Abnormal   Collection Time: 10/08/18  8:59 AM  Result Value Ref Range Status   Specimen Description   Final    RIGHT ANTECUBITAL Performed at [facility] [address], [place], [place] [address]    Special Requests   Final  BOTTLES DRAWN AEROBIC AND ANAEROBIC Blood Culture adequate volume Performed at [facility] [address], [place], [place] [address]    Culture  Setup Time   Final    GRAM POSITIVE COCCI IN CLUSTERS ANAEROBIC BOTTLE ONLY CRITICAL VALUE NOTED.  VALUE IS CONSISTENT WITH PREVIOUSLY REPORTED AND CALLED VALUE.    Culture (A)  Final    STAPHYLOCOCCUS AUREUS SUSCEPTIBILITIES PERFORMED ON PREVIOUS CULTURE WITHIN THE LAST 5 DAYS. Performed at [facility], [place] [address] [place], [place] [address]    Report Status 10/11/2018 FINAL  Final  Aerobic/Anaerobic Culture (surgical/deep wound)     Status: None (Preliminary result)   Collection Time: 10/08/18 12:33 PM  Result Value Ref Range Status   Specimen Description   Final    ABSCESS SPINE Performed at [facility] [address], [place], [place] [address]    Special Requests   Final    NONE Performed at [facility], [place] [address] [place], [place] [address]    Gram Stain   Final    ABUNDANT WBC PRESENT,BOTH PMN AND MONONUCLEAR RARE GRAM POSITIVE COCCI Performed at [facility], [place] [address], [place], [place] [address]    Culture   Final    FEW METHICILLIN RESISTANT STAPHYLOCOCCUS AUREUS NO ANAEROBES ISOLATED; CULTURE IN PROGRESS FOR 5 DAYS    Report Status PENDING  Incomplete   Organism ID, Bacteria METHICILLIN RESISTANT STAPHYLOCOCCUS AUREUS  Final      Susceptibility   Methicillin resistant staphylococcus aureus -  MIC*    CIPROFLOXACIN >=8 RESISTANT Resistant     ERYTHROMYCIN >=8 RESISTANT Resistant     GENTAMICIN <=0.5 SENSITIVE Sensitive     OXACILLIN >=4 RESISTANT Resistant     TETRACYCLINE <=1 SENSITIVE Sensitive     VANCOMYCIN 1 SENSITIVE Sensitive     TRIMETH/SULFA <=10 SENSITIVE Sensitive     CLINDAMYCIN <=0.25 SENSITIVE Sensitive     RIFAMPIN <=0.5 SENSITIVE Sensitive     Inducible Clindamycin NEGATIVE Sensitive     * FEW METHICILLIN RESISTANT STAPHYLOCOCCUS AUREUS  Culture, blood (Routine X 2) w Reflex to ID Panel     Status: None (Preliminary result)   Collection Time: 10/09/18 10:35 AM  Result Value Ref Range Status   Specimen Description   Final    BLOOD LEFT ARM Performed at [facility] [address], [place], [place] [address]    Special Requests   Final    BAA BCAV Performed at [facility] [address], [place], [place] [address]    Culture   Final    NO GROWTH 3 DAYS Performed at [facility], [place] [address], [place], [place] [address]    Report Status PENDING  Incomplete  Culture, blood (Routine X 2) w Reflex to ID Panel     Status: None (Preliminary result)   Collection Time: 10/09/18 10:41 AM  Result Value Ref Range Status   Specimen Description   Final    BLOOD RIGHT HAND Performed at [facility] [address] [place], [place] [address]    Special Requests   Final    BAA BCAV Performed at [facility] [address] [place], [place] [address]    Culture   Final    NO GROWTH 3 DAYS Performed at [facility], [place] [address] [place],  [address]    Report Status PENDING  Incomplete    Studies/Results: Dg  Abd 1 View  Result Date: 10/12/2018 CLINICAL DATA:  Mid to lower abdominal pain and constipation. EXAM: ABDOMEN - 1 VIEW COMPARISON:  Abdominal x-ray dated Oct 09, 2018. FINDINGS: Again seen is a large amount of stool in the right colon. No significant  air within the rectum. Mild gaseous distention of the splenic flexure, unchanged. Paucity of small bowel gas. Prior cholecystectomy. No acute osseous abnormality. IMPRESSION: 1. Continued large amount of stool in the right colon with mild gaseous distention of the splenic flexure. Electronically Signed   By: Titus Dubin M.D.   On: 10/12/2018 09:52      Assessment/Plan:  INTERVAL HISTORY:  Blood cx no growth   Principal Problem:   Discitis Active Problems:   Essential hypertension   Hyperlipidemia, mixed   Hepatitis C, chronic (HCC)   Tobacco use disorder   Prediabetes   Staphylococcus aureus bacteremia   Back pain   Subacute osteomyelitis, other site [facility])   IVDU (intravenous drug user)    Howard Fox is a 58 y.o. male with  T 10-11 diskitis, osteomyelitis and now found to have MRSA bacteremia. He has Chronic HCV without hepatic coma and admitted to me prior injection drug use though claims he has been clean since 2008 to me but other story to primary.  #1      [facility] Staphylococcus aureus bacteremia   Staphylococcus aureus bacteremia (SAB) is associated with a high rate of complications and mortality.  Specific aspects of clinical management are critical to optimizing the outcome of patients with SAB.  Therefore, the [facility] Antimicrobial Management Team [facility]) has initiated an intervention aimed at improving the management of SAB at [facility].  To do so, Infectious Diseases physicians are providing an evidence-based consult for the management of all patients with SAB.     Yes No Comments  Perform follow-up blood cultures (even if the patient is afebrile) to ensure clearance of bacteremia [x]  []  Repeated and NO GROWTH TO DATE 2 days  Remove vascular catheter and obtain follow-up blood cultures after the removal of the catheter []  []  DO NOT PLACE PICC   Perform echocardiography to evaluate for endocarditis (transthoracic ECHO is  40-50% sensitive, TEE is > 90% sensitive) [x]  []  Please keep in mind, that neither test can definitively EXCLUDE endocarditis, and that should clinical suspicion remain high for endocarditis the patient should then still be treated with an "endocarditis" duration of therapy = 6 weeks  Consult electrophysiologist to evaluate implanted cardiac device (pacemaker, ICD) []  []  NA  Ensure source control []  []  Have all abscesses been drained effectively? Have deep seeded infections (septic joints or osteomyelitis) had appropriate surgical debridement?  ? IVDU as source. Spine infected  Investigate for "metastatic" sites of infection []  []  Does the patient have ANY symptom or physical exam finding that would suggest a deeper infection (back or neck pain that may be suggestive of vertebral osteomyelitis or epidural abscess, muscle pain that could be a symptom of pyomyositis)?  Keep in mind that for deep seeded infections MRI imaging with contrast is preferred rather than other often insensitive tests such as plain x-rays, especially early in a patient's presentation. Spine is only clear site  Change antibiotic therapy to vancomycin []  []  Beta-lactam antibiotics are preferred for MSSA due to higher cure rates.   If on Vancomycin, goal trough should be 15 - 20 mcg/mL  Estimated duration of IV antibiotic therapy:  8 weeks  May try to give zyvox  as part of this though []  []  Consult case management for probably prolonged outpatient IV antibiotic therapy   #2 Vertebral osteomyelitis: duration as above. Tricky items will be is he active IVDU and what about risk to his 19 year old mother if he goes home with home health  #3 Hep C: happy to treat as an outpatient  #4 IVDU: admits to past use. THC + opiates + but on opiates as outpatient and given here as well. I have concern he is still actively using  I MAY WANT TO HAVE PART OF HIS REGIMEN AS ORAL ZYVOX THOUGH HE IS ON several SEROTONERIC agents and will need to  confer with pharmacy.   I am available over the weekend for questions  I will give his Mother a call as well  I will confer with ID pharmacy re possibility of zyvox and ajusted his serotonergic meds.      LOS: 4 days   Alcide Evener 10/12/2018, 3:33 PM

## 2018-10-12 NOTE — Plan of Care (Signed)
Patient lying in bed this morning. Complaining of abdominal and back pain and requesting pain medication. Has been up and ambulating in hall; up to bathroom independently. Will continue to monitor.

## 2018-10-12 NOTE — Progress Notes (Signed)
PROGRESS NOTE    Howard Fox  KVQ:[id number] DOB: 1960/11/09 DOA: 10/07/2018 PCP: Unk Pinto, MD   Brief Narrative: Howard Fox is a 58 y.o. male with medical history significant of hypertension, hyperlipidemia, prediabetes, polytrauma years ago, chronic pain syndrome who presents to the emergency department with complaints of worsening back pain ,left-sided rib pain and mild grade fever. MRI, CT imagings done in the emergency department showed discitis/osteomyelitis at T10-T12 with right paraspinal phlegmon.  Blood culture showed MRSA.  Anaerobic/aerobic culture also showed MRSA.  ID following.  Currently on vancomycin. Difficult discharge planning due to his history of IV drug abuse.  ID thinking about Zyvox versus vancomycin via PICC line.  Assessment & Plan:   Principal Problem:   Discitis Active Problems:   Essential hypertension   Hyperlipidemia, mixed   Hepatitis C, chronic (HCC)   Tobacco use disorder   Prediabetes   Staphylococcus aureus bacteremia   Back pain   Subacute osteomyelitis, other site [facility])   IVDU (intravenous drug user)   Discitis/osteomyelitis: Presented with back pain, fever.  MRI thoracic spine showed severe marrow edema and enhancement in the T10 and T11 vertebral bodies  concerning for discitis-osteomyelitis at T10-11. Also showed severe anterior paravertebral soft tissue edema and enhancement at T11-12 without a drainable component most consistent with a phlegmon. Underwent  IR for drainage of the discitis area.  Aerobic/anaerobic culture showed MRSA. Back pain has improved.  Currently afebrile .Continue vancomycin  MRSA bacteremia: Continue vancomycin.  ID following.Patient reported that he had prior history of IVDU,last use was 3 years ago.  Constipation: Abdominal x-ray shows continued large amount of stool in the right colon with mild gaseous distention of the splenic flexure.  Will try milk of magnesia today.  Hypertension: Currently  blood pressure stable.  Resume home medications.  Hyperlipidemia: Continue Crestor  Hepatitis C: Follows as an outpatient with liver clinic.  Not treated yet.  We recommend continued follow-up as an outpatient.Follow up HCV RNA.  Smoker: Smokes half pack a day for last several years.  Counseled for smoking cessation.  Continue  nicotine patch.  Chronic alcohol abuse: Drinks 3 bottles of 12 ounce beer every day.  Counseled for cessation.  Monitor for withdrawal.  Prediabetes: Not on medications at home.  Hemoglobin A1c level of 5.7  Chronic pain syndrome/chronic back pain/anxiety: Follows with orthopedics as an outpatient.  Continue supportive care,pain meds.  Has history of muscle spasms.  Resume his home meds          DVT prophylaxis:ScD Code Status: Full Family Communication: None present at the bedside Disposition Plan: Home after ID clearance   Consultants: ID  Procedures: Aspiration of discitis  Antimicrobials:  Anti-infectives (From admission, onward)   Start     Dose/Rate Route Frequency Ordered Stop   10/11/18 2200  vancomycin (VANCOCIN) 1,500 mg in sodium chloride 0.9 % 500 mL IVPB     1,500 mg 250 mL/hr over 120 Minutes Intravenous Every 12 hours 10/11/18 1102     10/09/18 2200  vancomycin (VANCOCIN) 1,250 mg in sodium chloride 0.9 % 250 mL IVPB  Status:  Discontinued     1,250 mg 166.7 mL/hr over 90 Minutes Intravenous Every 12 hours 10/09/18 0803 10/11/18 1102   10/09/18 0800  vancomycin (VANCOCIN) 2,500 mg in sodium chloride 0.9 % 500 mL IVPB     2,500 mg 250 mL/hr over 120 Minutes Intravenous  Once 10/09/18 0745 10/09/18 1154   10/08/18 0845  vancomycin (VANCOCIN) IVPB 1000 mg/200 mL  premix  Status:  Discontinued     1,000 mg 200 mL/hr over 60 Minutes Intravenous  Once 10/08/18 1610 10/08/18 0908      Subjective: Patient seen and examined at the bedside this morning.  Hemodynamically stable.  Afebrile.  Still has not had a bowel movement.   Objective: Vitals:   10/11/18 0629 10/11/18 1444 10/11/18 2130 10/12/18 0447  BP: 115/76 119/73 135/80 (!) 151/84  Pulse: 72 72 83 76  Resp: 18 16 18 18   Temp: 98.1 F (36.7 C) 98.4 F (36.9 C) 98.6 F (37 C) 98.1 F (36.7 C)  TempSrc: Oral Oral Oral Oral  SpO2: 96% 99% 97% 97%  Weight:      Height:        Intake/Output Summary (Last 24 hours) at 10/12/2018 1101 Last data filed at 10/12/2018 9604 Gross per 24 hour  Intake 2849.18 ml  Output 1800 ml  Net 1049.18 ml   Filed Weights   10/07/18 2130  Weight: 108.9 kg    Examination:  General exam: Appears calm and comfortable ,Not in distress,average built HEENT:PERRL,Oral mucosa moist, Ear/Nose normal on gross exam Respiratory system: Bilateral equal air entry, normal vesicular breath sounds, no wheezes or crackles  Cardiovascular system: S1 & S2 heard, RRR. No JVD, murmurs, rubs, gallops or clicks. Gastrointestinal system: Abdomen is distended, soft and nontender. Ventral hernia.No organomegaly or masses felt. Normal bowel sounds heard. Central nervous system: Alert and oriented. No focal neurological deficits. Extremities: No edema, no clubbing ,no cyanosis, distal peripheral pulses palpable. Skin: No rashes, lesions or ulcers,no icterus ,no pallor MSK: Normal muscle bulk,tone ,power Psychiatry: Judgement and insight appear normal. Mood & affect appropriate.       Data Reviewed: I have personally reviewed following labs and imaging studies  CBC: Recent Labs  Lab 10/07/18 2150 10/09/18 0419 10/10/18 0451  WBC 10.4 12.5* 9.0  NEUTROABS 7.2  --  5.8  HGB 13.4 13.3 12.9*  HCT 41.2 41.3 39.6  MCV 95.8 96.5 96.1  PLT 271 282 540   Basic Metabolic Panel: Recent Labs  Lab 10/07/18 2150 10/09/18 0419 10/10/18 0451 10/11/18 1023 10/12/18 0428  NA 137 132* 135  --   --   K 4.3 4.1 4.6  --   --   CL 104 98 100  --   --   CO2 25 23 25   --   --   GLUCOSE 99 115* 111*  --   --   BUN 13 18 18   --   --    CREATININE 0.78 0.88 1.01 0.95 0.82  CALCIUM 9.2 9.1 9.2  --   --   MG  --   --  2.3  --   --    GFR: Estimated Creatinine Clearance: 124.7 mL/min (by C-G formula based on SCr of 0.82 mg/dL). Liver Function Tests: Recent Labs  Lab 10/07/18 2150  AST 18  ALT 18  ALKPHOS 84  BILITOT 0.4  PROT 7.5  ALBUMIN 3.4*   Recent Labs  Lab 10/07/18 2150  LIPASE 21   No results for input(s): AMMONIA in the last 168 hours. Coagulation Profile: Recent Labs  Lab 10/08/18 1057  INR 1.0   Cardiac Enzymes: Recent Labs  Lab 10/07/18 2150  TROPONINI <0.03   BNP (last 3 results) No results for input(s): PROBNP in the last 8760 hours. HbA1C: No results for input(s): HGBA1C in the last 72 hours. CBG: No results for input(s): GLUCAP in the last 168 hours. Lipid Profile: No results for  input(s): CHOL, HDL, LDLCALC, TRIG, CHOLHDL, LDLDIRECT in the last 72 hours. Thyroid Function Tests: No results for input(s): TSH, T4TOTAL, FREET4, T3FREE, THYROIDAB in the last 72 hours. Anemia Panel: No results for input(s): VITAMINB12, FOLATE, FERRITIN, TIBC, IRON, RETICCTPCT in the last 72 hours. Sepsis Labs: No results for input(s): PROCALCITON, LATICACIDVEN in the last 168 hours.  Recent Results (from the past 240 hour(s))  SARS Coronavirus 2 (CEPHEID - Performed in [facility] lab), Hosp Order     Status: None   Collection Time: 10/07/18 10:28 PM  Result Value Ref Range Status   SARS Coronavirus 2 NEGATIVE NEGATIVE Final    Comment: (NOTE) If result is NEGATIVE SARS-CoV-2 target nucleic acids are NOT DETECTED. The SARS-CoV-2 RNA is generally detectable in upper and lower  respiratory specimens during the acute phase of infection. The lowest  concentration of SARS-CoV-2 viral copies this assay can detect is 250  copies / mL. A negative result does not preclude SARS-CoV-2 infection  and should not be used as the sole basis for treatment or other  patient management decisions.  A  negative result may occur with  improper specimen collection / handling, submission of specimen other  than nasopharyngeal swab, presence of viral mutation(s) within the  areas targeted by this assay, and inadequate number of viral copies  (<250 copies / mL). A negative result must be combined with clinical  observations, patient history, and epidemiological information. If result is POSITIVE SARS-CoV-2 target nucleic acids are DETECTED. The SARS-CoV-2 RNA is generally detectable in upper and lower  respiratory specimens dur ing the acute phase of infection.  Positive  results are indicative of active infection with SARS-CoV-2.  Clinical  correlation with patient history and other diagnostic information is  necessary to determine patient infection status.  Positive results do  not rule out bacterial infection or co-infection with other viruses. If result is PRESUMPTIVE POSTIVE SARS-CoV-2 nucleic acids MAY BE PRESENT.   A presumptive positive result was obtained on the submitted specimen  and confirmed on repeat testing.  While 2019 novel coronavirus  (SARS-CoV-2) nucleic acids may be present in the submitted sample  additional confirmatory testing may be necessary for epidemiological  and / or clinical management purposes  to differentiate between  SARS-CoV-2 and other Sarbecovirus currently known to infect humans.  If clinically indicated additional testing with an alternate test  methodology [phone]) is advised. The SARS-CoV-2 RNA is generally  detectable in upper and lower respiratory sp ecimens during the acute  phase of infection. The expected result is Negative. Fact Sheet for Patients:  StrictlyIdeas.no Fact Sheet for Healthcare Providers: BankingDealers.co.za This test is not yet approved or cleared by the [place] FDA and has been authorized for detection and/or diagnosis of SARS-CoV-2 by FDA under an Emergency Use  Authorization (EUA).  This EUA will remain in effect (meaning this test can be used) for the duration of the COVID-19 declaration under Section 564(b)(1) of the Act, 21 U.S.C. section 360bbb-3(b)(1), unless the authorization is terminated or revoked sooner. Performed at [facility], [place] [address], [place], [place] [address]   Culture, blood (Routine X 2) w Reflex to ID Panel     Status: Abnormal (Preliminary result)   Collection Time: 10/08/18  8:54 AM  Result Value Ref Range Status   Specimen Description   Final    LEFT ANTECUBITAL Performed at [facility] [address] [place], [place] [address]    Special Requests   Final  BOTTLES DRAWN AEROBIC AND ANAEROBIC Blood Culture adequate volume Performed at [facility] [address], [place], [place] [address]    Culture  Setup Time   Final    GRAM POSITIVE COCCI IN BOTH AEROBIC AND ANAEROBIC BOTTLES CRITICAL RESULT CALLED TO, READ BACK BY AND VERIFIED WITH: M. RENZ PHARMD, AT 0706 10/09/18 BY Rush Landmark Performed at [facility], [place] [address] [place], [place] [address]    Culture METHICILLIN RESISTANT STAPHYLOCOCCUS AUREUS (A)  Final   Report Status PENDING  Incomplete   Organism ID, Bacteria METHICILLIN RESISTANT STAPHYLOCOCCUS AUREUS  Final      Susceptibility   Methicillin resistant staphylococcus aureus - MIC*    CIPROFLOXACIN >=8 RESISTANT Resistant     ERYTHROMYCIN >=8 RESISTANT Resistant     GENTAMICIN <=0.5 SENSITIVE Sensitive     OXACILLIN RESISTANT Resistant     TETRACYCLINE <=1 SENSITIVE Sensitive     VANCOMYCIN <=0.5 SENSITIVE Sensitive     TRIMETH/SULFA <=10 SENSITIVE Sensitive     CLINDAMYCIN <=0.25 SENSITIVE Sensitive     RIFAMPIN <=0.5 SENSITIVE Sensitive     Inducible Clindamycin NEGATIVE Sensitive     * METHICILLIN RESISTANT STAPHYLOCOCCUS AUREUS  Blood Culture ID Panel (Reflexed)     Status: Abnormal   Collection Time: 10/08/18  8:54  AM  Result Value Ref Range Status   Enterococcus species NOT DETECTED NOT DETECTED Final   Listeria monocytogenes NOT DETECTED NOT DETECTED Final   Staphylococcus species DETECTED (A) NOT DETECTED Final    Comment: CRITICAL RESULT CALLED TO, READ BACK BY AND VERIFIED WITH: M. RENZ PHARMD, AT 0706 10/09/18 BY D. VANHOOK    Staphylococcus aureus (BCID) DETECTED (A) NOT DETECTED Final    Comment: Methicillin (oxacillin)-resistant Staphylococcus aureus (MRSA). MRSA is predictably resistant to beta-lactam antibiotics (except ceftaroline). Preferred therapy is vancomycin unless clinically contraindicated. Patient requires contact precautions if  hospitalized. CRITICAL RESULT CALLED TO, READ BACK BY AND VERIFIED WITH: M. RENZ PHARMD, AT 1191 10/09/18 BY D. VANHOOK    Methicillin resistance DETECTED (A) NOT DETECTED Final    Comment: CRITICAL RESULT CALLED TO, READ BACK BY AND VERIFIED WITH: M. RENZ PHARMD, AT 0706 10/09/18 BY D. VANHOOK    Streptococcus species NOT DETECTED NOT DETECTED Final   Streptococcus agalactiae NOT DETECTED NOT DETECTED Final   Streptococcus pneumoniae NOT DETECTED NOT DETECTED Final   Streptococcus pyogenes NOT DETECTED NOT DETECTED Final   Acinetobacter baumannii NOT DETECTED NOT DETECTED Final   Enterobacteriaceae species NOT DETECTED NOT DETECTED Final   Enterobacter cloacae complex NOT DETECTED NOT DETECTED Final   Escherichia coli NOT DETECTED NOT DETECTED Final   Klebsiella oxytoca NOT DETECTED NOT DETECTED Final   Klebsiella pneumoniae NOT DETECTED NOT DETECTED Final   Proteus species NOT DETECTED NOT DETECTED Final   Serratia marcescens NOT DETECTED NOT DETECTED Final   Haemophilus influenzae NOT DETECTED NOT DETECTED Final   Neisseria meningitidis NOT DETECTED NOT DETECTED Final   Pseudomonas aeruginosa NOT DETECTED NOT DETECTED Final   Candida albicans NOT DETECTED NOT DETECTED Final   Candida glabrata NOT DETECTED NOT DETECTED Final   Candida krusei NOT  DETECTED NOT DETECTED Final   Candida parapsilosis NOT DETECTED NOT DETECTED Final   Candida tropicalis NOT DETECTED NOT DETECTED Final    Comment: Performed at [facility], [place] [address], [place], [place] [address]  Culture, blood (Routine X 2) w Reflex to ID Panel     Status: Abnormal   Collection Time: 10/08/18  8:59 AM  Result Value Ref Range Status   Specimen Description   Final    RIGHT ANTECUBITAL Performed at [facility] [address], [place], [place] [address]    Special Requests   Final    BOTTLES DRAWN AEROBIC AND ANAEROBIC Blood Culture adequate volume Performed at [facility] [address] [place], [place] [address]    Culture  Setup Time   Final    GRAM POSITIVE COCCI IN CLUSTERS ANAEROBIC BOTTLE ONLY CRITICAL VALUE NOTED.  VALUE IS CONSISTENT WITH PREVIOUSLY REPORTED AND CALLED VALUE.    Culture (A)  Final    STAPHYLOCOCCUS AUREUS SUSCEPTIBILITIES PERFORMED ON PREVIOUS CULTURE WITHIN THE LAST 5 DAYS. Performed at [facility], [place] [address] [place], [place] [address]    Report Status 10/11/2018 FINAL  Final  Aerobic/Anaerobic Culture (surgical/deep wound)     Status: None (Preliminary result)   Collection Time: 10/08/18 12:33 PM  Result Value Ref Range Status   Specimen Description   Final    ABSCESS SPINE Performed at [facility] [address] [place], [place] [address]    Special Requests   Final    NONE Performed at [facility], [place] [address] [place], [place] [address]    Gram Stain   Final    ABUNDANT WBC PRESENT,BOTH PMN AND MONONUCLEAR RARE GRAM POSITIVE COCCI Performed at [facility], [place] [address] [place], [place] [address]    Culture   Final    FEW METHICILLIN RESISTANT STAPHYLOCOCCUS AUREUS NO ANAEROBES ISOLATED; CULTURE IN PROGRESS FOR 5 DAYS    Report Status PENDING  Incomplete   Organism ID, Bacteria METHICILLIN RESISTANT  STAPHYLOCOCCUS AUREUS  Final      Susceptibility   Methicillin resistant staphylococcus aureus - MIC*    CIPROFLOXACIN >=8 RESISTANT Resistant     ERYTHROMYCIN >=8 RESISTANT Resistant     GENTAMICIN <=0.5 SENSITIVE Sensitive     OXACILLIN >=4 RESISTANT Resistant     TETRACYCLINE <=1 SENSITIVE Sensitive     VANCOMYCIN 1 SENSITIVE Sensitive     TRIMETH/SULFA <=10 SENSITIVE Sensitive     CLINDAMYCIN <=0.25 SENSITIVE Sensitive     RIFAMPIN <=0.5 SENSITIVE Sensitive     Inducible Clindamycin NEGATIVE Sensitive     * FEW METHICILLIN RESISTANT STAPHYLOCOCCUS AUREUS  Culture, blood (Routine X 2) w Reflex to ID Panel     Status: None (Preliminary result)   Collection Time: 10/09/18 10:35 AM  Result Value Ref Range Status   Specimen Description   Final    BLOOD LEFT ARM Performed at [facility] [address] [place], [place] [address]    Special Requests   Final    BAA BCAV Performed at [facility] [address], [place], [place] [address]    Culture   Final    NO GROWTH 3 DAYS Performed at [facility], [place] [address] [place], [place] [address]    Report Status PENDING  Incomplete  Culture, blood (Routine X 2) w Reflex to ID Panel     Status: None (Preliminary result)   Collection Time: 10/09/18 10:41 AM  Result Value Ref Range Status   Specimen Description   Final    BLOOD RIGHT HAND Performed at [facility] [address] [place],  [address]    Special Requests   Final    BAA BCAV Performed at [facility]  [place], [place] [address]    Culture   Final    NO GROWTH 3 DAYS Performed at [facility], [place] [address], [place], [place] [address]    Report Status PENDING  Incomplete         Radiology Studies: Dg Abd 1 View  Result Date: 10/12/2018 CLINICAL DATA:  Mid to lower abdominal pain and constipation. EXAM: ABDOMEN - 1 VIEW COMPARISON:  Abdominal  x-ray dated Oct 09, 2018. FINDINGS: Again seen is a large amount of stool in the right colon. No significant air within the rectum. Mild gaseous distention of the splenic flexure, unchanged. Paucity of small bowel gas. Prior cholecystectomy. No acute osseous abnormality. IMPRESSION: 1. Continued large amount of stool in the right colon with mild gaseous distention of the splenic flexure. Electronically Signed   By: Titus Dubin M.D.   On: 10/12/2018 09:52        Scheduled Meds: . amitriptyline  150 mg Oral QHS  . aspirin EC  325 mg Oral Daily  . citalopram  40 mg Oral Daily  . gabapentin  300 mg Oral TID  . lisinopril  10 mg Oral Daily   And  . hydrochlorothiazide  12.5 mg Oral Daily  . nicotine  14 mg Transdermal Daily  . polyethylene glycol  17 g Oral BID  . rosuvastatin  40 mg Oral Daily  . senna-docusate  1 tablet Oral BID   Continuous Infusions: . vancomycin 1,500 mg (10/12/18 1029)     LOS: 4 days    Time spent: 25 mins.More than 50% of that time was spent in counseling and/or coordination of care.      Shelly Coss, MD [facility] Pager [phone]  If 7PM-7AM, please contact night-coverage [email or web address] Password [email or web address] 10/12/2018, 11:01 AM

## 2018-10-13 LAB — AEROBIC/ANAEROBIC CULTURE W GRAM STAIN (SURGICAL/DEEP WOUND)

## 2018-10-13 LAB — CULTURE, BLOOD (ROUTINE X 2): Special Requests: ADEQUATE

## 2018-10-13 LAB — CREATININE, SERUM
Creatinine, Ser: 0.72 mg/dL (ref 0.61–1.24)
GFR calc Af Amer: >60 mL/min (ref >60)
GFR calc non Af Amer: >60 mL/min (ref >60)

## 2018-10-13 MED ORDER — CITALOPRAM HYDROBROMIDE 20 MG PO TABS
20.0000 mg | ORAL_TABLET | Freq: Every day | ORAL | Status: DC
  Administered 2018-10-14: 20 mg via ORAL
  Filled 2018-10-13: qty 1

## 2018-10-13 NOTE — Progress Notes (Addendum)
PROGRESS NOTE    Howard Fox  KZL:[id number] DOB: 03-14-1961 DOA: 10/07/2018 PCP: Howard Pinto, MD   Brief Narrative: Howard Fox is a 58 y.o. male with medical history significant of hypertension, hyperlipidemia, prediabetes, polytrauma years ago, chronic pain syndrome who presents to the emergency department with complaints of worsening back pain ,left-sided rib pain and mild grade fever. MRI, CT imagings done in the emergency department showed discitis/osteomyelitis at T10-T12 with right paraspinal phlegmon.  Blood culture showed MRSA.  Anaerobic/aerobic culture also showed MRSA.  ID following.  Currently on vancomycin. Difficult discharge planning due to his history of IV drug abuse.  ID thinking about Zyvox versus vancomycin via PICC line.  Waiting from ID recommendation on antibiotics for discharge.  Assessment & Plan:   Principal Problem:   Discitis Active Problems:   Essential hypertension   Hyperlipidemia, mixed   Hepatitis C, chronic (HCC)   Tobacco use disorder   Prediabetes   MRSA bacteremia   Back pain   Subacute osteomyelitis, other site [facility])   IVDU (intravenous drug user)   Discitis/osteomyelitis: Presented with back pain, fever.  MRI thoracic spine showed severe marrow edema and enhancement in the T10 and T11 vertebral bodies  concerning for discitis-osteomyelitis at T10-11. Also showed severe anterior paravertebral soft tissue edema and enhancement at T11-12 without a drainable component most consistent with a phlegmon. Underwent  IR for drainage of the discitis area.  Aerobic/anaerobic culture showed MRSA. Back pain has improved.  Currently afebrile .Continue vancomycin  MRSA bacteremia: Continue vancomycin.  ID following.Patient reported that he had prior history of IVDU,last use was 3 years ago.  Constipation: Abdominal x-ray shows continued large amount of stool in the right colon with mild gaseous distention of the splenic flexure.  He had 2 bowel  movements yesterday.  Continue bowel regimen  Hypertension: Currently blood pressure stable.  Resume home medications.  Hyperlipidemia: Continue Crestor  Hepatitis C: Follows as an outpatient with liver clinic.  Not treated yet.  We recommend continued follow-up as an outpatient.Follow up HCV RNA.  Smoker: Smokes half pack a day for last several years.  Counseled for smoking cessation.  Continue  nicotine patch.  Chronic alcohol abuse: Drinks 3 bottles of 12 ounce beer every day.  Counseled for cessation.  Monitor for withdrawal.  Prediabetes: Not on medications at home.  Hemoglobin A1c level of 5.7  Chronic pain syndrome/chronic back pain/anxiety: Follows with orthopedics as an outpatient.  Continue supportive care,pain meds.  Has history of muscle spasms.  Resume his home meds  Persistent left subcostal pain:Patient complains of persistent pain on the left subcostal region.  He has history of polytrauma in the past.  This pain is chronic. Abd Xray showed: Multiple old left-sided rib fractures , status post fixation. Many of the wires are fractured. Continue pain management.Follow up as an outpatient.         DVT prophylaxis:ScD Code Status: Full Family Communication: Discussed with mother on phone yesterday. Disposition Plan: Home after ID clearance   Consultants: ID  Procedures: Aspiration of discitis  Antimicrobials:  Anti-infectives (From admission, onward)   Start     Dose/Rate Route Frequency Ordered Stop   10/11/18 2200  vancomycin (VANCOCIN) 1,500 mg in sodium chloride 0.9 % 500 mL IVPB     1,500 mg 250 mL/hr over 120 Minutes Intravenous Every 12 hours 10/11/18 1102     10/09/18 2200  vancomycin (VANCOCIN) 1,250 mg in sodium chloride 0.9 % 250 mL IVPB  Status:  Discontinued  1,250 mg 166.7 mL/hr over 90 Minutes Intravenous Every 12 hours 10/09/18 0803 10/11/18 1102   10/09/18 0800  vancomycin (VANCOCIN) 2,500 mg in sodium chloride 0.9 % 500 mL IVPB      2,500 mg 250 mL/hr over 120 Minutes Intravenous  Once 10/09/18 0745 10/09/18 1154   10/08/18 0845  vancomycin (VANCOCIN) IVPB 1000 mg/200 mL premix  Status:  Discontinued     1,000 mg 200 mL/hr over 60 Minutes Intravenous  Once 10/08/18 0835 10/08/18 0908      Subjective: Patient seen and examined the bedside this morning.  Remains comfortable.  Hemodynamically stable.  As usual, he complains of pain on his left subcostal region.  Had 2 bowel movements yesterday.  Objective: Vitals:   10/12/18 0447 10/12/18 1406 10/12/18 2153 10/13/18 0604  BP: (!) 151/84 139/90 (!) 160/87 116/61  Pulse: 76 79 82 73  Resp: 18 18 20 18   Temp: 98.1 F (36.7 C) 98.2 F (36.8 C) 97.9 F (36.6 C) 97.8 F (36.6 C)  TempSrc: Oral Oral Oral Oral  SpO2: 97% 95% 98% 95%  Weight:      Height:        Intake/Output Summary (Last 24 hours) at 10/13/2018 1232 Last data filed at 10/13/2018 1005 Gross per 24 hour  Intake 2350 ml  Output 1500 ml  Net 850 ml   Filed Weights   10/07/18 2130  Weight: 108.9 kg    Examination:  General exam: Appears calm and comfortable ,Not in distress,average built HEENT:PERRL,Oral mucosa moist, Ear/Nose normal on gross exam Respiratory system: Bilateral equal air entry, normal vesicular breath sounds, no wheezes or crackles  Cardiovascular system: S1 & S2 heard, RRR. No JVD, murmurs, rubs, gallops or clicks. Gastrointestinal system: Abdomen is distended, soft and nontender.  Tenderness on the left subcostal region.  No organomegaly or masses felt. Normal bowel sounds heard. Central nervous system: Alert and oriented. No focal neurological deficits. Extremities: No edema, no clubbing ,no cyanosis, distal peripheral pulses palpable. Skin: No rashes, lesions or ulcers,no icterus ,no pallor MSK: Normal muscle bulk,tone ,power Psychiatry: Judgement and insight appear normal. Mood & affect appropriate.       Data Reviewed: I have personally reviewed following labs and  imaging studies  CBC: Recent Labs  Lab 10/07/18 2150 10/09/18 0419 10/10/18 0451  WBC 10.4 12.5* 9.0  NEUTROABS 7.2  --  5.8  HGB 13.4 13.3 12.9*  HCT 41.2 41.3 39.6  MCV 95.8 96.5 96.1  PLT 271 282 161   Basic Metabolic Panel: Recent Labs  Lab 10/07/18 2150 10/09/18 0419 10/10/18 0451 10/11/18 1023 10/12/18 0428 10/13/18 0355  NA 137 132* 135  --   --   --   K 4.3 4.1 4.6  --   --   --   CL 104 98 100  --   --   --   CO2 25 23 25   --   --   --   GLUCOSE 99 115* 111*  --   --   --   BUN 13 18 18   --   --   --   CREATININE 0.78 0.88 1.01 0.95 0.82 0.72  CALCIUM 9.2 9.1 9.2  --   --   --   MG  --   --  2.3  --   --   --    GFR: Estimated Creatinine Clearance: 127.8 mL/min (by C-G formula based on SCr of 0.72 mg/dL). Liver Function Tests: Recent Labs  Lab 10/07/18 2150  AST  18  ALT 18  ALKPHOS 84  BILITOT 0.4  PROT 7.5  ALBUMIN 3.4*   Recent Labs  Lab 10/07/18 2150  LIPASE 21   No results for input(s): AMMONIA in the last 168 hours. Coagulation Profile: Recent Labs  Lab 10/08/18 1057  INR 1.0   Cardiac Enzymes: Recent Labs  Lab 10/07/18 2150  TROPONINI <0.03   BNP (last 3 results) No results for input(s): PROBNP in the last 8760 hours. HbA1C: No results for input(s): HGBA1C in the last 72 hours. CBG: No results for input(s): GLUCAP in the last 168 hours. Lipid Profile: No results for input(s): CHOL, HDL, LDLCALC, TRIG, CHOLHDL, LDLDIRECT in the last 72 hours. Thyroid Function Tests: No results for input(s): TSH, T4TOTAL, FREET4, T3FREE, THYROIDAB in the last 72 hours. Anemia Panel: No results for input(s): VITAMINB12, FOLATE, FERRITIN, TIBC, IRON, RETICCTPCT in the last 72 hours. Sepsis Labs: No results for input(s): PROCALCITON, LATICACIDVEN in the last 168 hours.  Recent Results (from the past 240 hour(s))  SARS Coronavirus 2 (CEPHEID - Performed in [place] hospital lab), Hosp Order     Status: None   Collection Time: 10/07/18 10:28  PM  Result Value Ref Range Status   SARS Coronavirus 2 NEGATIVE NEGATIVE Final    Comment: (NOTE) If result is NEGATIVE SARS-CoV-2 target nucleic acids are NOT DETECTED. The SARS-CoV-2 RNA is generally detectable in upper and lower  respiratory specimens during the acute phase of infection. The lowest  concentration of SARS-CoV-2 viral copies this assay can detect is 250  copies / mL. A negative result does not preclude SARS-CoV-2 infection  and should not be used as the sole basis for treatment or other  patient management decisions.  A negative result may occur with  improper specimen collection / handling, submission of specimen other  than nasopharyngeal swab, presence of viral mutation(s) within the  areas targeted by this assay, and inadequate number of viral copies  (<250 copies / mL). A negative result must be combined with clinical  observations, patient history, and epidemiological information. If result is POSITIVE SARS-CoV-2 target nucleic acids are DETECTED. The SARS-CoV-2 RNA is generally detectable in upper and lower  respiratory specimens dur ing the acute phase of infection.  Positive  results are indicative of active infection with SARS-CoV-2.  Clinical  correlation with patient history and other diagnostic information is  necessary to determine patient infection status.  Positive results do  not rule out bacterial infection or co-infection with other viruses. If result is PRESUMPTIVE POSTIVE SARS-CoV-2 nucleic acids MAY BE PRESENT.   A presumptive positive result was obtained on the submitted specimen  and confirmed on repeat testing.  While 2019 novel coronavirus  (SARS-CoV-2) nucleic acids may be present in the submitted sample  additional confirmatory testing may be necessary for epidemiological  and / or clinical management purposes  to differentiate between  SARS-CoV-2 and other Sarbecovirus currently known to infect humans.  If clinically indicated  additional testing with an alternate test  methodology [phone]) is advised. The SARS-CoV-2 RNA is generally  detectable in upper and lower respiratory sp ecimens during the acute  phase of infection. The expected result is Negative. Fact Sheet for Patients:  StrictlyIdeas.no Fact Sheet for Healthcare Providers: BankingDealers.co.za This test is not yet approved or cleared by the [place] FDA and has been authorized for detection and/or diagnosis of SARS-CoV-2 by FDA under an Emergency Use Authorization (EUA).  This EUA will remain in effect (meaning this test can be  used) for the duration of the COVID-19 declaration under Section 564(b)(1) of the Act, 21 U.S.C. section 360bbb-3(b)(1), unless the authorization is terminated or revoked sooner. Performed at [facility], [place] [address], [place], [place] [address]   Culture, blood (Routine X 2) w Reflex to ID Panel     Status: Abnormal (Preliminary result)   Collection Time: 10/08/18  8:54 AM  Result Value Ref Range Status   Specimen Description   Final    LEFT ANTECUBITAL Performed at [place] [address], [place], [place] [address]    Special Requests   Final    BOTTLES DRAWN AEROBIC AND ANAEROBIC Blood Culture adequate volume Performed at [facility] [address], [place], [place] [address]    Culture  Setup Time   Final    GRAM POSITIVE COCCI IN BOTH AEROBIC AND ANAEROBIC BOTTLES CRITICAL RESULT CALLED TO, READ BACK BY AND VERIFIED WITH: M. RENZ PHARMD, AT 0706 10/09/18 BY Rush Landmark Performed at [facility], [place] [address] [place], [place] [address]    Culture METHICILLIN RESISTANT STAPHYLOCOCCUS AUREUS (A)  Final   Report Status PENDING  Incomplete   Organism ID, Bacteria METHICILLIN RESISTANT STAPHYLOCOCCUS AUREUS  Final      Susceptibility   Methicillin resistant staphylococcus aureus - MIC*     CIPROFLOXACIN >=8 RESISTANT Resistant     ERYTHROMYCIN >=8 RESISTANT Resistant     GENTAMICIN <=0.5 SENSITIVE Sensitive     OXACILLIN RESISTANT Resistant     TETRACYCLINE <=1 SENSITIVE Sensitive     VANCOMYCIN <=0.5 SENSITIVE Sensitive     TRIMETH/SULFA <=10 SENSITIVE Sensitive     CLINDAMYCIN <=0.25 SENSITIVE Sensitive     RIFAMPIN <=0.5 SENSITIVE Sensitive     Inducible Clindamycin NEGATIVE Sensitive     * METHICILLIN RESISTANT STAPHYLOCOCCUS AUREUS  Blood Culture ID Panel (Reflexed)     Status: Abnormal   Collection Time: 10/08/18  8:54 AM  Result Value Ref Range Status   Enterococcus species NOT DETECTED NOT DETECTED Final   Listeria monocytogenes NOT DETECTED NOT DETECTED Final   Staphylococcus species DETECTED (A) NOT DETECTED Final    Comment: CRITICAL RESULT CALLED TO, READ BACK BY AND VERIFIED WITH: M. RENZ PHARMD, AT 0706 10/09/18 BY D. VANHOOK    Staphylococcus aureus (BCID) DETECTED (A) NOT DETECTED Final    Comment: Methicillin (oxacillin)-resistant Staphylococcus aureus (MRSA). MRSA is predictably resistant to beta-lactam antibiotics (except ceftaroline). Preferred therapy is vancomycin unless clinically contraindicated. Patient requires contact precautions if  hospitalized. CRITICAL RESULT CALLED TO, READ BACK BY AND VERIFIED WITH: M. RENZ PHARMD, AT 9371 10/09/18 BY D. VANHOOK    Methicillin resistance DETECTED (A) NOT DETECTED Final    Comment: CRITICAL RESULT CALLED TO, READ BACK BY AND VERIFIED WITH: M. RENZ PHARMD, AT 0706 10/09/18 BY D. VANHOOK    Streptococcus species NOT DETECTED NOT DETECTED Final   Streptococcus agalactiae NOT DETECTED NOT DETECTED Final   Streptococcus pneumoniae NOT DETECTED NOT DETECTED Final   Streptococcus pyogenes NOT DETECTED NOT DETECTED Final   Acinetobacter baumannii NOT DETECTED NOT DETECTED Final   Enterobacteriaceae species NOT DETECTED NOT DETECTED Final   Enterobacter cloacae complex NOT DETECTED NOT DETECTED Final    Escherichia coli NOT DETECTED NOT DETECTED Final   Klebsiella oxytoca NOT DETECTED NOT DETECTED Final   Klebsiella pneumoniae NOT DETECTED NOT DETECTED Final   Proteus species NOT DETECTED NOT DETECTED Final   Serratia marcescens NOT DETECTED NOT DETECTED Final  Haemophilus influenzae NOT DETECTED NOT DETECTED Final   Neisseria meningitidis NOT DETECTED NOT DETECTED Final   Pseudomonas aeruginosa NOT DETECTED NOT DETECTED Final   Candida albicans NOT DETECTED NOT DETECTED Final   Candida glabrata NOT DETECTED NOT DETECTED Final   Candida krusei NOT DETECTED NOT DETECTED Final   Candida parapsilosis NOT DETECTED NOT DETECTED Final   Candida tropicalis NOT DETECTED NOT DETECTED Final    Comment: Performed at [facility], [place] [address] [place], [place] [address]  Culture, blood (Routine X 2) w Reflex to ID Panel     Status: Abnormal   Collection Time: 10/08/18  8:59 AM  Result Value Ref Range Status   Specimen Description   Final    RIGHT ANTECUBITAL Performed at [facility] [address], [place], [place] [address]    Special Requests   Final    BOTTLES DRAWN AEROBIC AND ANAEROBIC Blood Culture adequate volume Performed at [facility] [address] [place], [place] [address]    Culture  Setup Time   Final    GRAM POSITIVE COCCI IN CLUSTERS ANAEROBIC BOTTLE ONLY CRITICAL VALUE NOTED.  VALUE IS CONSISTENT WITH PREVIOUSLY REPORTED AND CALLED VALUE.    Culture (A)  Final    STAPHYLOCOCCUS AUREUS SUSCEPTIBILITIES PERFORMED ON PREVIOUS CULTURE WITHIN THE LAST 5 DAYS. Performed at [facility], [place] [address], [place], [place] [address]    Report Status 10/11/2018 FINAL  Final  Aerobic/Anaerobic Culture (surgical/deep wound)     Status: None   Collection Time: 10/08/18 12:33 PM  Result Value Ref Range Status   Specimen Description   Final    ABSCESS SPINE Performed at [place] [address]  [place], [place] [address]    Special Requests   Final    NONE Performed at [facility], [place] [address] [place], [place] [address]    Gram Stain   Final    ABUNDANT WBC PRESENT,BOTH PMN AND MONONUCLEAR RARE GRAM POSITIVE COCCI    Culture   Final    FEW METHICILLIN RESISTANT STAPHYLOCOCCUS AUREUS NO ANAEROBES ISOLATED Performed at [facility], [place] [address], [place], [place] [address]    Report Status 10/13/2018 FINAL  Final   Organism ID, Bacteria METHICILLIN RESISTANT STAPHYLOCOCCUS AUREUS  Final      Susceptibility   Methicillin resistant staphylococcus aureus - MIC*    CIPROFLOXACIN >=8 RESISTANT Resistant     ERYTHROMYCIN >=8 RESISTANT Resistant     GENTAMICIN <=0.5 SENSITIVE Sensitive     OXACILLIN >=4 RESISTANT Resistant     TETRACYCLINE <=1 SENSITIVE Sensitive     VANCOMYCIN 1 SENSITIVE Sensitive     TRIMETH/SULFA <=10 SENSITIVE Sensitive     CLINDAMYCIN <=0.25 SENSITIVE Sensitive     RIFAMPIN <=0.5 SENSITIVE Sensitive     Inducible Clindamycin NEGATIVE Sensitive     * FEW METHICILLIN RESISTANT STAPHYLOCOCCUS AUREUS  Culture, blood (Routine X 2) w Reflex to ID Panel     Status: None (Preliminary result)   Collection Time: 10/09/18 10:35 AM  Result Value Ref Range Status   Specimen Description   Final    BLOOD LEFT ARM Performed at [facility] [address], [place], [place] [address]    Special Requests   Final    BAA BCAV Performed at [facility] [address], [place], [place] [address]    Culture   Final    NO GROWTH  3 DAYS Performed at [facility], [place] [address], [place], [place] [address]    Report Status PENDING  Incomplete  Culture, blood (Routine X 2) w Reflex to ID Panel     Status: None (Preliminary result)   Collection Time: 10/09/18 10:41 AM  Result Value Ref Range Status   Specimen Description   Final    BLOOD RIGHT HAND Performed at [facility]  [address], [place], [place] [address]    Special Requests   Final    BAA BCAV Performed at [facility] [address], [place], [place] [address]    Culture   Final    NO GROWTH 3 DAYS Performed at [facility], [place] [address] [place], [place] [address]    Report Status PENDING  Incomplete         Radiology Studies: Dg Abd 1 View  Result Date: 10/12/2018 CLINICAL DATA:  Mid to lower abdominal pain and constipation. EXAM: ABDOMEN - 1 VIEW COMPARISON:  Abdominal x-ray dated Oct 09, 2018. FINDINGS: Again seen is a large amount of stool in the right colon. No significant air within the rectum. Mild gaseous distention of the splenic flexure, unchanged. Paucity of small bowel gas. Prior cholecystectomy. No acute osseous abnormality. IMPRESSION: 1. Continued large amount of stool in the right colon with mild gaseous distention of the splenic flexure. Electronically Signed   By: Titus Dubin M.D.   On: 10/12/2018 09:52        Scheduled Meds:  amitriptyline  150 mg Oral QHS   aspirin EC  325 mg Oral Daily   [START ON 10/14/2018] citalopram  20 mg Oral Daily   gabapentin  300 mg Oral TID   lisinopril  10 mg Oral Daily   And   hydrochlorothiazide  12.5 mg Oral Daily   nicotine  14 mg Transdermal Daily   polyethylene glycol  17 g Oral BID   rosuvastatin  40 mg Oral Daily   senna-docusate  1 tablet Oral BID   Continuous Infusions:  vancomycin 1,500 mg (10/13/18 0909)     LOS: 5 days    Time spent: 25 mins.More than 50% of that time was spent in counseling and/or coordination of care.      Shelly Coss, MD [facility] Pager [phone]  If 7PM-7AM, please contact night-coverage [email or web address] Password [facility] 10/13/2018, 12:32 PM

## 2018-10-13 NOTE — Progress Notes (Signed)
Subjective:  Less anxious today  Antibiotics:  Anti-infectives (From admission, onward)   Start     Dose/Rate Route Frequency Ordered Stop   10/11/18 2200  vancomycin (VANCOCIN) 1,500 mg in sodium chloride 0.9 % 500 mL IVPB     1,500 mg 250 mL/hr over 120 Minutes Intravenous Every 12 hours 10/11/18 1102     10/09/18 2200  vancomycin (VANCOCIN) 1,250 mg in sodium chloride 0.9 % 250 mL IVPB  Status:  Discontinued     1,250 mg 166.7 mL/hr over 90 Minutes Intravenous Every 12 hours 10/09/18 0803 10/11/18 1102   10/09/18 0800  vancomycin (VANCOCIN) 2,500 mg in sodium chloride 0.9 % 500 mL IVPB     2,500 mg 250 mL/hr over 120 Minutes Intravenous  Once 10/09/18 0745 10/09/18 1154   10/08/18 0845  vancomycin (VANCOCIN) IVPB 1000 mg/200 mL premix  Status:  Discontinued     1,000 mg 200 mL/hr over 60 Minutes Intravenous  Once 10/08/18 0835 10/08/18 0908      Medications: Scheduled Meds:  amitriptyline  150 mg Oral QHS   aspirin EC  325 mg Oral Daily   [START ON 10/14/2018] citalopram  20 mg Oral Daily   gabapentin  300 mg Oral TID   lisinopril  10 mg Oral Daily   And   hydrochlorothiazide  12.5 mg Oral Daily   nicotine  14 mg Transdermal Daily   polyethylene glycol  17 g Oral BID   rosuvastatin  40 mg Oral Daily   senna-docusate  1 tablet Oral BID   Continuous Infusions:  vancomycin 1,500 mg (10/13/18 0909)   PRN Meds:.ALPRAZolam, carisoprodol, morphine injection, Muscle Rub, oxyCODONE-acetaminophen **AND** oxyCODONE    Objective: Weight change:   Intake/Output Summary (Last 24 hours) at 10/13/2018 1657 Last data filed at 10/13/2018 1650 Gross per 24 hour  Intake 3210 ml  Output 1225 ml  Net 1985 ml   Blood pressure 118/75, pulse 75, temperature 97.7 F (36.5 C), temperature source Oral, resp. rate 18, height 5\' 11"  (1.803 m), weight 108.9 kg, SpO2 95 %. Temp:  [97.7 F (36.5 C)-97.9 F (36.6 C)] 97.7 F (36.5 C) (05/23 1354) Pulse Rate:  [73-82]  75 (05/23 1354) Resp:  [18-20] 18 (05/23 1354) BP: (116-160)/(61-87) 118/75 (05/23 1354) SpO2:  [95 %-98 %] 95 % (05/23 1354)  Physical Exam: General: Alert and awake, oriented x3, not in any acute distress. HEENT: anicteric sclera, EOMI CVS regular rate, normal  No mgr Chest: , no wheezing, no respiratory distress ctab, he has discomfort when getting up out of bed on the left side Abdomen: soft non-distended,  Extremities: no edema or deformity noted bilaterally Skin: no rashes, Neuro: nonfocal  CBC:    BMET Recent Labs    10/12/18 0428 10/13/18 0355  CREATININE 0.82 0.72     Liver Panel  No results for input(s): PROT, ALBUMIN, AST, ALT, ALKPHOS, BILITOT, BILIDIR, IBILI in the last 72 hours.     Sedimentation Rate No results for input(s): ESRSEDRATE in the last 72 hours. C-Reactive Protein No results for input(s): CRP in the last 72 hours.  Micro Results: Recent Results (from the past 720 hour(s))  SARS Coronavirus 2 (CEPHEID - Performed in [place] hospital lab), Hosp Order     Status: None   Collection Time: 10/07/18 10:28 PM  Result Value Ref Range Status   SARS Coronavirus 2 NEGATIVE NEGATIVE Final    Comment: (NOTE) If result is NEGATIVE SARS-CoV-2 target nucleic acids are  NOT DETECTED. The SARS-CoV-2 RNA is generally detectable in upper and lower  respiratory specimens during the acute phase of infection. The lowest  concentration of SARS-CoV-2 viral copies this assay can detect is 250  copies / mL. A negative result does not preclude SARS-CoV-2 infection  and should not be used as the sole basis for treatment or other  patient management decisions.  A negative result may occur with  improper specimen collection / handling, submission of specimen other  than nasopharyngeal swab, presence of viral mutation(s) within the  areas targeted by this assay, and inadequate number of viral copies  (<250 copies / mL). A negative result must be combined with  clinical  observations, patient history, and epidemiological information. If result is POSITIVE SARS-CoV-2 target nucleic acids are DETECTED. The SARS-CoV-2 RNA is generally detectable in upper and lower  respiratory specimens dur ing the acute phase of infection.  Positive  results are indicative of active infection with SARS-CoV-2.  Clinical  correlation with patient history and other diagnostic information is  necessary to determine patient infection status.  Positive results do  not rule out bacterial infection or co-infection with other viruses. If result is PRESUMPTIVE POSTIVE SARS-CoV-2 nucleic acids MAY BE PRESENT.   A presumptive positive result was obtained on the submitted specimen  and confirmed on repeat testing.  While 2019 novel coronavirus  (SARS-CoV-2) nucleic acids may be present in the submitted sample  additional confirmatory testing may be necessary for epidemiological  and / or clinical management purposes  to differentiate between  SARS-CoV-2 and other Sarbecovirus currently known to infect humans.  If clinically indicated additional testing with an alternate test  methodology [phone]) is advised. The SARS-CoV-2 RNA is generally  detectable in upper and lower respiratory sp ecimens during the acute  phase of infection. The expected result is Negative. Fact Sheet for Patients:  StrictlyIdeas.no Fact Sheet for Healthcare Providers: BankingDealers.co.za This test is not yet approved or cleared by the [place] FDA and has been authorized for detection and/or diagnosis of SARS-CoV-2 by FDA under an Emergency Use Authorization (EUA).  This EUA will remain in effect (meaning this test can be used) for the duration of the COVID-19 declaration under Section 564(b)(1) of the Act, 21 U.S.C. section 360bbb-3(b)(1), unless the authorization is terminated or revoked sooner. Performed at [facility],  [place] [address] [place], [place] [address]   Culture, blood (Routine X 2) w Reflex to ID Panel     Status: Abnormal (Preliminary result)   Collection Time: 10/08/18  8:54 AM  Result Value Ref Range Status   Specimen Description   Final    LEFT ANTECUBITAL Performed at [facility] [address], [place], [place] [address]    Special Requests   Final    BOTTLES DRAWN AEROBIC AND ANAEROBIC Blood Culture adequate volume Performed at [facility] [address], [place], [place] [address]    Culture  Setup Time   Final    GRAM POSITIVE COCCI IN BOTH AEROBIC AND ANAEROBIC BOTTLES CRITICAL RESULT CALLED TO, READ BACK BY AND VERIFIED WITH: M. RENZ PHARMD, AT 0706 10/09/18 BY Rush Landmark Performed at [facility], [place] [address] [place], [place] [address]    Culture METHICILLIN RESISTANT STAPHYLOCOCCUS AUREUS (A)  Final   Report Status PENDING  Incomplete   Organism ID, Bacteria METHICILLIN RESISTANT STAPHYLOCOCCUS AUREUS  Final      Susceptibility   Methicillin resistant staphylococcus aureus - MIC*  CIPROFLOXACIN >=8 RESISTANT Resistant     ERYTHROMYCIN >=8 RESISTANT Resistant     GENTAMICIN <=0.5 SENSITIVE Sensitive     OXACILLIN RESISTANT Resistant     TETRACYCLINE <=1 SENSITIVE Sensitive     VANCOMYCIN <=0.5 SENSITIVE Sensitive     TRIMETH/SULFA <=10 SENSITIVE Sensitive     CLINDAMYCIN <=0.25 SENSITIVE Sensitive     RIFAMPIN <=0.5 SENSITIVE Sensitive     Inducible Clindamycin NEGATIVE Sensitive     * METHICILLIN RESISTANT STAPHYLOCOCCUS AUREUS  Blood Culture ID Panel (Reflexed)     Status: Abnormal   Collection Time: 10/08/18  8:54 AM  Result Value Ref Range Status   Enterococcus species NOT DETECTED NOT DETECTED Final   Listeria monocytogenes NOT DETECTED NOT DETECTED Final   Staphylococcus species DETECTED (A) NOT DETECTED Final    Comment: CRITICAL RESULT CALLED TO, READ BACK BY AND VERIFIED WITH: M. RENZ PHARMD, AT 0706  10/09/18 BY D. VANHOOK    Staphylococcus aureus (BCID) DETECTED (A) NOT DETECTED Final    Comment: Methicillin (oxacillin)-resistant Staphylococcus aureus (MRSA). MRSA is predictably resistant to beta-lactam antibiotics (except ceftaroline). Preferred therapy is vancomycin unless clinically contraindicated. Patient requires contact precautions if  hospitalized. CRITICAL RESULT CALLED TO, READ BACK BY AND VERIFIED WITH: M. RENZ PHARMD, AT 2992 10/09/18 BY D. VANHOOK    Methicillin resistance DETECTED (A) NOT DETECTED Final    Comment: CRITICAL RESULT CALLED TO, READ BACK BY AND VERIFIED WITH: M. RENZ PHARMD, AT 0706 10/09/18 BY D. VANHOOK    Streptococcus species NOT DETECTED NOT DETECTED Final   Streptococcus agalactiae NOT DETECTED NOT DETECTED Final   Streptococcus pneumoniae NOT DETECTED NOT DETECTED Final   Streptococcus pyogenes NOT DETECTED NOT DETECTED Final   Acinetobacter baumannii NOT DETECTED NOT DETECTED Final   Enterobacteriaceae species NOT DETECTED NOT DETECTED Final   Enterobacter cloacae complex NOT DETECTED NOT DETECTED Final   Escherichia coli NOT DETECTED NOT DETECTED Final   Klebsiella oxytoca NOT DETECTED NOT DETECTED Final   Klebsiella pneumoniae NOT DETECTED NOT DETECTED Final   Proteus species NOT DETECTED NOT DETECTED Final   Serratia marcescens NOT DETECTED NOT DETECTED Final   Haemophilus influenzae NOT DETECTED NOT DETECTED Final   Neisseria meningitidis NOT DETECTED NOT DETECTED Final   Pseudomonas aeruginosa NOT DETECTED NOT DETECTED Final   Candida albicans NOT DETECTED NOT DETECTED Final   Candida glabrata NOT DETECTED NOT DETECTED Final   Candida krusei NOT DETECTED NOT DETECTED Final   Candida parapsilosis NOT DETECTED NOT DETECTED Final   Candida tropicalis NOT DETECTED NOT DETECTED Final    Comment: Performed at [facility], [place] [address], [place], [place] [address]  Culture, blood (Routine X 2) w Reflex to ID Panel     Status: Abnormal    Collection Time: 10/08/18  8:59 AM  Result Value Ref Range Status   Specimen Description   Final    RIGHT ANTECUBITAL Performed at [facility] [address], [place], [place] [address]    Special Requests   Final    BOTTLES DRAWN AEROBIC AND ANAEROBIC Blood Culture adequate volume Performed at [facility] [address] [place], [place] [address]    Culture  Setup Time   Final    GRAM POSITIVE COCCI IN CLUSTERS ANAEROBIC BOTTLE ONLY CRITICAL VALUE NOTED.  VALUE IS CONSISTENT WITH PREVIOUSLY REPORTED AND CALLED VALUE.    Culture (A)  Final    STAPHYLOCOCCUS AUREUS SUSCEPTIBILITIES PERFORMED ON PREVIOUS CULTURE WITHIN THE LAST 5  DAYS. Performed at [facility], [place] [address], [place], [place] [address]    Report Status 10/11/2018 FINAL  Final  Aerobic/Anaerobic Culture (surgical/deep wound)     Status: None   Collection Time: 10/08/18 12:33 PM  Result Value Ref Range Status   Specimen Description   Final    ABSCESS SPINE Performed at [facility] [address], [place], [place] [address]    Special Requests   Final    NONE Performed at [facility], [place] [address] [place], [place] [address]    Gram Stain   Final    ABUNDANT WBC PRESENT,BOTH PMN AND MONONUCLEAR RARE GRAM POSITIVE COCCI    Culture   Final    FEW METHICILLIN RESISTANT STAPHYLOCOCCUS AUREUS NO ANAEROBES ISOLATED Performed at [facility], [place] [address] [place], [place] [address]    Report Status 10/13/2018 FINAL  Final   Organism ID, Bacteria METHICILLIN RESISTANT STAPHYLOCOCCUS AUREUS  Final      Susceptibility   Methicillin resistant staphylococcus aureus - MIC*    CIPROFLOXACIN >=8 RESISTANT Resistant     ERYTHROMYCIN >=8 RESISTANT Resistant     GENTAMICIN <=0.5 SENSITIVE Sensitive     OXACILLIN >=4 RESISTANT Resistant     TETRACYCLINE <=1 SENSITIVE Sensitive     VANCOMYCIN 1 SENSITIVE Sensitive      TRIMETH/SULFA <=10 SENSITIVE Sensitive     CLINDAMYCIN <=0.25 SENSITIVE Sensitive     RIFAMPIN <=0.5 SENSITIVE Sensitive     Inducible Clindamycin NEGATIVE Sensitive     * FEW METHICILLIN RESISTANT STAPHYLOCOCCUS AUREUS  Culture, blood (Routine X 2) w Reflex to ID Panel     Status: None (Preliminary result)   Collection Time: 10/09/18 10:35 AM  Result Value Ref Range Status   Specimen Description   Final    BLOOD LEFT ARM Performed at [facility] [address] [place], [place] [address]    Special Requests   Final    BAA BCAV Performed at [facility] [address] [place], [place] [address]    Culture   Final    NO GROWTH 4 DAYS Performed at [facility], [place] [address] [place], [place] [address]    Report Status PENDING  Incomplete  Culture, blood (Routine X 2) w Reflex to ID Panel     Status: None (Preliminary result)   Collection Time: 10/09/18 10:41 AM  Result Value Ref Range Status   Specimen Description   Final    BLOOD RIGHT HAND Performed at [facility] [address] [place], [place] [address]    Special Requests   Final    BAA BCAV Performed at [facility] [address] [place], [place] [address]    Culture   Final    NO GROWTH 4 DAYS Performed at [facility], [place] [address] [place], [place] [address]    Report Status PENDING  Incomplete    Studies/Results: Dg Abd 1 View  Result Date: 10/12/2018 CLINICAL DATA:  Mid to lower abdominal pain and constipation. EXAM: ABDOMEN - 1 VIEW COMPARISON:  Abdominal x-ray dated Oct 09, 2018. FINDINGS: Again seen is a large amount of stool in the right colon. No significant air within the rectum. Mild gaseous distention of the splenic flexure, unchanged. Paucity of small bowel gas. Prior cholecystectomy. No acute osseous abnormality. IMPRESSION: 1. Continued large amount of stool in the right colon with mild gaseous distention  of the splenic flexure. Electronically Signed   By: Titus Dubin M.D.   On: 10/12/2018 09:52      Assessment/Plan:  INTERVAL HISTORY:  Reducing his celexa to accomodate  zyvox    Principal Problem:   Discitis Active Problems:   Essential hypertension   Hyperlipidemia, mixed   Hepatitis C, chronic (HCC)   Tobacco use disorder   Prediabetes   MRSA bacteremia   Back pain   Subacute osteomyelitis, other site [facility])   IVDU (intravenous drug user)    Howard Fox is a 58 y.o. male with  T 10-11 diskitis, osteomyelitis and now found to have MRSA bacteremia. He has Chronic HCV without hepatic coma and admitted to me prior injection drug use though claims he has been clean since 2008 to me but other story to primary.  #1      [facility] Staphylococcus aureus bacteremia   Staphylococcus aureus bacteremia (SAB) is associated with a high rate of complications and mortality.  Specific aspects of clinical management are critical to optimizing the outcome of patients with SAB.  Therefore, the [facility] Antimicrobial Management Team [facility]) has initiated an intervention aimed at improving the management of SAB at [facility].  To do so, Infectious Diseases physicians are providing an evidence-based consult for the management of all patients with SAB.     Yes No Comments  Perform follow-up blood cultures (even if the patient is afebrile) to ensure clearance of bacteremia [x]  []  Repeated and NO GROWTH TO DATE 4 days  Remove vascular catheter and obtain follow-up blood cultures after the removal of the catheter []  []  DO NOT PLACE PICC   Perform echocardiography to evaluate for endocarditis (transthoracic ECHO is 40-50% sensitive, TEE is > 90% sensitive) [x]  []  Please keep in mind, that neither test can definitively EXCLUDE endocarditis, and that should clinical suspicion remain high for endocarditis the patient should then still be treated with an "endocarditis"  duration of therapy = 6 weeks  Consult electrophysiologist to evaluate implanted cardiac device (pacemaker, ICD) []  []  NA  Ensure source control []  []  Have all abscesses been drained effectively? Have deep seeded infections (septic joints or osteomyelitis) had appropriate surgical debridement?  ? IVDU as source. Spine infected  Investigate for metastatic sites of infection []  []  Does the patient have ANY symptom or physical exam finding that would suggest a deeper infection (back or neck pain that may be suggestive of vertebral osteomyelitis or epidural abscess, muscle pain that could be a symptom of pyomyositis)?  Keep in mind that for deep seeded infections MRI imaging with contrast is preferred rather than other often insensitive tests such as plain x-rays, especially early in a patient's presentation. Spine is only clear site  Change antibiotic therapy to vancomycin []  []  Beta-lactam antibiotics are preferred for MSSA due to higher cure rates.   If on Vancomycin, goal trough should be 15 - 20 mcg/mL  Estimated duration of IV antibiotic therapy:  8 weeks  May try to give zyvox as part of this though []  []  Consult case management for probably prolonged outpatient IV antibiotic therapy   #2 Vertebral osteomyelitis: duration as above. Tricky items will be is he active IVDU and what about risk to his 44 year old mother if he goes home with home health  #3 Hep C: happy to treat as an outpatient  #4 IVDU: admits to past use. THC + opiates + but on opiates as outpatient and given here as well.  I have concern he is still actively using  I am dropping  His celexa dose to 20mg .   I will likely switch him over to zyvox tomorrow and then observe him through Monday.  Plan to continue to taper off the celexa in the meantime       LOS: 5 days   Alcide Evener 10/13/2018, 4:57 PM

## 2018-10-14 ENCOUNTER — Encounter (HOSPITAL_COMMUNITY): Payer: Self-pay

## 2018-10-14 ENCOUNTER — Inpatient Hospital Stay (HOSPITAL_COMMUNITY): Payer: Medicare Other

## 2018-10-14 LAB — COMPREHENSIVE METABOLIC PANEL
ALT: 18 U/L (ref 0–44)
AST: 18 U/L (ref 15–41)
Albumin: 3.3 g/dL — ABNORMAL LOW (ref 3.5–5.0)
Alkaline Phosphatase: 87 U/L (ref 38–126)
Anion gap: 7 (ref 5–15)
BUN: 14 mg/dL (ref 6–20)
CO2: 29 mmol/L (ref 22–32)
Calcium: 9.5 mg/dL (ref 8.9–10.3)
Chloride: 98 mmol/L (ref 98–111)
Creatinine, Ser: 0.79 mg/dL (ref 0.61–1.24)
GFR calc Af Amer: >60 mL/min (ref >60)
GFR calc non Af Amer: >60 mL/min (ref >60)
Glucose, Bld: 103 mg/dL — ABNORMAL HIGH (ref 70–99)
Potassium: 4.6 mmol/L (ref 3.5–5.1)
Sodium: 134 mmol/L — ABNORMAL LOW (ref 135–145)
Total Bilirubin: 0.5 mg/dL (ref 0.3–1.2)
Total Protein: 7.8 g/dL (ref 6.5–8.1)

## 2018-10-14 LAB — CULTURE, BLOOD (ROUTINE X 2)
Culture: NO GROWTH
Culture: NO GROWTH

## 2018-10-14 LAB — CBC WITH DIFFERENTIAL/PLATELET
Abs Immature Granulocytes: 0.06 K/uL (ref 0.00–0.07)
Basophils Absolute: 0.1 K/uL (ref 0.0–0.1)
Basophils Relative: 1 %
Eosinophils Absolute: 0.2 K/uL (ref 0.0–0.5)
Eosinophils Relative: 2 %
HCT: 39.6 % (ref 39.0–52.0)
Hemoglobin: 12.6 g/dL — ABNORMAL LOW (ref 13.0–17.0)
Immature Granulocytes: 1 %
Lymphocytes Relative: 15 %
Lymphs Abs: 1.4 K/uL (ref 0.7–4.0)
MCH: 30.1 pg (ref 26.0–34.0)
MCHC: 31.8 g/dL (ref 30.0–36.0)
MCV: 94.5 fL (ref 80.0–100.0)
Monocytes Absolute: 0.9 K/uL (ref 0.1–1.0)
Monocytes Relative: 10 %
Neutro Abs: 6.8 K/uL (ref 1.7–7.7)
Neutrophils Relative %: 71 %
Platelets: 329 K/uL (ref 150–400)
RBC: 4.19 MIL/uL — ABNORMAL LOW (ref 4.22–5.81)
RDW: 12.3 % (ref 11.5–15.5)
WBC: 9.3 K/uL (ref 4.0–10.5)
nRBC: 0 % (ref 0.0–0.2)

## 2018-10-14 LAB — CREATININE, SERUM
Creatinine, Ser: 0.8 mg/dL (ref 0.61–1.24)
GFR calc Af Amer: >60 mL/min (ref >60)
GFR calc non Af Amer: >60 mL/min (ref >60)

## 2018-10-14 MED ORDER — LINEZOLID 600 MG PO TABS
600.0000 mg | ORAL_TABLET | Freq: Two times a day (BID) | ORAL | Status: DC
  Administered 2018-10-14 – 2018-10-15 (×3): 600 mg via ORAL
  Filled 2018-10-14 (×3): qty 1

## 2018-10-14 MED ORDER — SORBITOL 70 % SOLN
960.0000 mL | TOPICAL_OIL | Freq: Two times a day (BID) | ORAL | Status: AC
  Administered 2018-10-14: 960 mL via RECTAL
  Filled 2018-10-14 (×2): qty 473

## 2018-10-14 MED ORDER — CITALOPRAM HYDROBROMIDE 20 MG PO TABS
10.0000 mg | ORAL_TABLET | Freq: Every day | ORAL | Status: DC
  Administered 2018-10-15: 10 mg via ORAL
  Filled 2018-10-14 (×2): qty 1

## 2018-10-14 NOTE — Progress Notes (Signed)
Patient states he does not want to do the enema at the moment. States he would like to walk around and see if he can have a BM. He states "now that my pain medications have been stopped, I should be able to use the bathroom". RN made patient aware that patient has an enema scheduled for now and at 1800. Patient stated "he will take the one at 6p.m. if he doesn't have a BM before then".

## 2018-10-14 NOTE — Progress Notes (Signed)
Subjective:  Feeling better  Antibiotics:  Anti-infectives (From admission, onward)   Start     Dose/Rate Route Frequency Ordered Stop   10/14/18 1500  linezolid (ZYVOX) tablet 600 mg     600 mg Oral Every 12 hours 10/14/18 1448     10/11/18 2200  vancomycin (VANCOCIN) 1,500 mg in sodium chloride 0.9 % 500 mL IVPB  Status:  Discontinued     1,500 mg 250 mL/hr over 120 Minutes Intravenous Every 12 hours 10/11/18 1102 10/14/18 1448   10/09/18 2200  vancomycin (VANCOCIN) 1,250 mg in sodium chloride 0.9 % 250 mL IVPB  Status:  Discontinued     1,250 mg 166.7 mL/hr over 90 Minutes Intravenous Every 12 hours 10/09/18 0803 10/11/18 1102   10/09/18 0800  vancomycin (VANCOCIN) 2,500 mg in sodium chloride 0.9 % 500 mL IVPB     2,500 mg 250 mL/hr over 120 Minutes Intravenous  Once 10/09/18 0745 10/09/18 1154   10/08/18 0845  vancomycin (VANCOCIN) IVPB 1000 mg/200 mL premix  Status:  Discontinued     1,000 mg 200 mL/hr over 60 Minutes Intravenous  Once 10/08/18 0835 10/08/18 0908      Medications: Scheduled Meds:  amitriptyline  150 mg Oral QHS   aspirin EC  325 mg Oral Daily   [START ON 10/15/2018] citalopram  10 mg Oral Daily   gabapentin  300 mg Oral TID   lisinopril  10 mg Oral Daily   And   hydrochlorothiazide  12.5 mg Oral Daily   linezolid  600 mg Oral Q12H   nicotine  14 mg Transdermal Daily   polyethylene glycol  17 g Oral BID   rosuvastatin  40 mg Oral Daily   senna-docusate  1 tablet Oral BID   sorbitol, milk of mag, mineral oil, glycerin (SMOG) enema  960 mL Rectal BID   Continuous Infusions:  PRN Meds:.ALPRAZolam, carisoprodol, Muscle Rub    Objective: Weight change:   Intake/Output Summary (Last 24 hours) at 10/14/2018 1503 Last data filed at 10/14/2018 1343 Gross per 24 hour  Intake 2940 ml  Output 2500 ml  Net 440 ml   Blood pressure 134/72, pulse 77, temperature 98.5 F (36.9 C), temperature source Oral, resp. rate 18, height 5\' 11"   (1.803 m), weight 108.9 kg, SpO2 100 %. Temp:  [98.2 F (36.8 C)-98.5 F (36.9 C)] 98.5 F (36.9 C) (05/24 1329) Pulse Rate:  [77-79] 77 (05/24 1329) Resp:  [18] 18 (05/24 1329) BP: (122-134)/(72-75) 134/72 (05/24 1329) SpO2:  [100 %] 100 % (05/24 1329)  Physical Exam: General: Alert and awake, oriented x3, not in any acute distress. HEENT: anicteric sclera, EOMI CVS regular rate, normal  No mgr Chest: , no wheezing, no respiratory distress ctab, Howard Fox has discomfort when getting up out of bed on the left side Abdomen: soft non-distended,  Extremities: no edema or deformity noted bilaterally Skin: no rashes, Neuro: nonfocal  CBC:    BMET Recent Labs    10/14/18 0412 10/14/18 1105  NA  --  134*  K  --  4.6  CL  --  98  CO2  --  29  GLUCOSE  --  103*  BUN  --  14  CREATININE 0.80 0.79  CALCIUM  --  9.5     Liver Panel  Recent Labs    10/14/18 1105  PROT 7.8  ALBUMIN 3.3*  AST 18  ALT 18  ALKPHOS 87  BILITOT 0.5  Sedimentation Rate No results for input(s): ESRSEDRATE in the last 72 hours. C-Reactive Protein No results for input(s): CRP in the last 72 hours.  Micro Results: Recent Results (from the past 720 hour(s))  SARS Coronavirus 2 (CEPHEID - Performed in [place] hospital lab), Hosp Order     Status: None   Collection Time: 10/07/18 10:28 PM  Result Value Ref Range Status   SARS Coronavirus 2 NEGATIVE NEGATIVE Final    Comment: (NOTE) If result is NEGATIVE SARS-CoV-2 target nucleic acids are NOT DETECTED. The SARS-CoV-2 RNA is generally detectable in upper and lower  respiratory specimens during the acute phase of infection. The lowest  concentration of SARS-CoV-2 viral copies this assay can detect is 250  copies / mL. A negative result does not preclude SARS-CoV-2 infection  and should not be used as the sole basis for treatment or other  patient management decisions.  A negative result may occur with  improper specimen collection /  handling, submission of specimen other  than nasopharyngeal swab, presence of viral mutation(s) within the  areas targeted by this assay, and inadequate number of viral copies  (<250 copies / mL). A negative result must be combined with clinical  observations, patient history, and epidemiological information. If result is POSITIVE SARS-CoV-2 target nucleic acids are DETECTED. The SARS-CoV-2 RNA is generally detectable in upper and lower  respiratory specimens dur ing the acute phase of infection.  Positive  results are indicative of active infection with SARS-CoV-2.  Clinical  correlation with patient history and other diagnostic information is  necessary to determine patient infection status.  Positive results do  not rule out bacterial infection or co-infection with other viruses. If result is PRESUMPTIVE POSTIVE SARS-CoV-2 nucleic acids MAY BE PRESENT.   A presumptive positive result was obtained on the submitted specimen  and confirmed on repeat testing.  While 2019 novel coronavirus  (SARS-CoV-2) nucleic acids may be present in the submitted sample  additional confirmatory testing may be necessary for epidemiological  and / or clinical management purposes  to differentiate between  SARS-CoV-2 and other Sarbecovirus currently known to infect humans.  If clinically indicated additional testing with an alternate test  methodology [phone]) is advised. The SARS-CoV-2 RNA is generally  detectable in upper and lower respiratory sp ecimens during the acute  phase of infection. The expected result is Negative. Fact Sheet for Patients:  StrictlyIdeas.no Fact Sheet for Healthcare Providers: BankingDealers.co.za This test is not yet approved or cleared by the [place] FDA and has been authorized for detection and/or diagnosis of SARS-CoV-2 by FDA under an Emergency Use Authorization (EUA).  This EUA will remain in effect (meaning this  test can be used) for the duration of the COVID-19 declaration under Section 564(b)(1) of the Act, 21 U.S.C. section 360bbb-3(b)(1), unless the authorization is terminated or revoked sooner. Performed at [facility], [place] [address], [place], [place] [address]   Culture, blood (Routine X 2) w Reflex to ID Panel     Status: Abnormal   Collection Time: 10/08/18  8:54 AM  Result Value Ref Range Status   Specimen Description   Final    LEFT ANTECUBITAL Performed at [facility] [address], [place], [place] [address]    Special Requests   Final    BOTTLES DRAWN AEROBIC AND ANAEROBIC Blood Culture adequate volume Performed at [facility] [address], [place], [place] [address]    Culture  Setup Time   Final  GRAM POSITIVE COCCI IN BOTH AEROBIC AND ANAEROBIC BOTTLES CRITICAL RESULT CALLED TO, READ BACK BY AND VERIFIED WITH: M. RENZ PHARMD, AT 0706 10/09/18 BY Rush [place]mark Performed at [facility], [place] [address], [place], [place] [address]    Culture METHICILLIN RESISTANT STAPHYLOCOCCUS AUREUS (A)  Final   Report Status 10/13/2018 FINAL  Final   Organism ID, Bacteria METHICILLIN RESISTANT STAPHYLOCOCCUS AUREUS  Final      Susceptibility   Methicillin resistant staphylococcus aureus - MIC*    CIPROFLOXACIN >=8 RESISTANT Resistant     ERYTHROMYCIN >=8 RESISTANT Resistant     GENTAMICIN <=0.5 SENSITIVE Sensitive     OXACILLIN RESISTANT Resistant     TETRACYCLINE <=1 SENSITIVE Sensitive     VANCOMYCIN <=0.5 SENSITIVE Sensitive     TRIMETH/SULFA <=10 SENSITIVE Sensitive     CLINDAMYCIN <=0.25 SENSITIVE Sensitive     RIFAMPIN <=0.5 SENSITIVE Sensitive     Inducible Clindamycin NEGATIVE Sensitive     * METHICILLIN RESISTANT STAPHYLOCOCCUS AUREUS  Blood Culture ID Panel (Reflexed)     Status: Abnormal   Collection Time: 10/08/18  8:54 AM  Result Value Ref Range Status   Enterococcus species NOT DETECTED NOT DETECTED  Final   Listeria monocytogenes NOT DETECTED NOT DETECTED Final   Staphylococcus species DETECTED (A) NOT DETECTED Final    Comment: CRITICAL RESULT CALLED TO, READ BACK BY AND VERIFIED WITH: M. RENZ PHARMD, AT 0706 10/09/18 BY D. VANHOOK    Staphylococcus aureus (BCID) DETECTED (A) NOT DETECTED Final    Comment: Methicillin (oxacillin)-resistant Staphylococcus aureus (MRSA). MRSA is predictably resistant to beta-lactam antibiotics (except ceftaroline). Preferred therapy is vancomycin unless clinically contraindicated. Patient requires contact precautions if  hospitalized. CRITICAL RESULT CALLED TO, READ BACK BY AND VERIFIED WITH: M. RENZ PHARMD, AT 1601 10/09/18 BY D. VANHOOK    Methicillin resistance DETECTED (A) NOT DETECTED Final    Comment: CRITICAL RESULT CALLED TO, READ BACK BY AND VERIFIED WITH: M. RENZ PHARMD, AT 0706 10/09/18 BY D. VANHOOK    Streptococcus species NOT DETECTED NOT DETECTED Final   Streptococcus agalactiae NOT DETECTED NOT DETECTED Final   Streptococcus pneumoniae NOT DETECTED NOT DETECTED Final   Streptococcus pyogenes NOT DETECTED NOT DETECTED Final   Acinetobacter baumannii NOT DETECTED NOT DETECTED Final   Enterobacteriaceae species NOT DETECTED NOT DETECTED Final   Enterobacter cloacae complex NOT DETECTED NOT DETECTED Final   Escherichia coli NOT DETECTED NOT DETECTED Final   Klebsiella oxytoca NOT DETECTED NOT DETECTED Final   Klebsiella pneumoniae NOT DETECTED NOT DETECTED Final   Proteus species NOT DETECTED NOT DETECTED Final   Serratia marcescens NOT DETECTED NOT DETECTED Final   Haemophilus influenzae NOT DETECTED NOT DETECTED Final   Neisseria meningitidis NOT DETECTED NOT DETECTED Final   Pseudomonas aeruginosa NOT DETECTED NOT DETECTED Final   Candida albicans NOT DETECTED NOT DETECTED Final   Candida glabrata NOT DETECTED NOT DETECTED Final   Candida krusei NOT DETECTED NOT DETECTED Final   Candida parapsilosis NOT DETECTED NOT DETECTED Final    Candida tropicalis NOT DETECTED NOT DETECTED Final    Comment: Performed at [facility], [place] [address], [place], [place] [address]  Culture, blood (Routine X 2) w Reflex to ID Panel     Status: Abnormal   Collection Time: 10/08/18  8:59 AM  Result Value Ref Range Status   Specimen Description   Final    RIGHT ANTECUBITAL Performed at [facility] [address] [place], [place] [address]  Special Requests   Final    BOTTLES DRAWN AEROBIC AND ANAEROBIC Blood Culture adequate volume Performed at [facility] [address], [place], [place] [address]    Culture  Setup Time   Final    GRAM POSITIVE COCCI IN CLUSTERS ANAEROBIC BOTTLE ONLY CRITICAL VALUE NOTED.  VALUE IS CONSISTENT WITH PREVIOUSLY REPORTED AND CALLED VALUE.    Culture (A)  Final    STAPHYLOCOCCUS AUREUS SUSCEPTIBILITIES PERFORMED ON PREVIOUS CULTURE WITHIN THE LAST 5 DAYS. Performed at [facility], [place] [address] [place], [place] [address]    Report Status 10/11/2018 FINAL  Final  Aerobic/Anaerobic Culture (surgical/deep wound)     Status: None   Collection Time: 10/08/18 12:33 PM  Result Value Ref Range Status   Specimen Description   Final    ABSCESS SPINE Performed at [facility] [address] [address], [place], [place] [address]    Special Requests   Final    NONE Performed at [facility], [place] [address], [place], [place] [address]    Gram Stain   Final    ABUNDANT WBC PRESENT,BOTH PMN AND MONONUCLEAR RARE GRAM POSITIVE COCCI    Culture   Final    FEW METHICILLIN RESISTANT STAPHYLOCOCCUS AUREUS NO ANAEROBES ISOLATED Performed at [facility], [place] [address], [place], [place] [address]    Report Status 10/13/2018 FINAL  Final   Organism ID, Bacteria METHICILLIN RESISTANT STAPHYLOCOCCUS AUREUS  Final      Susceptibility   Methicillin resistant staphylococcus aureus - MIC*    CIPROFLOXACIN >=8 RESISTANT  Resistant     ERYTHROMYCIN >=8 RESISTANT Resistant     GENTAMICIN <=0.5 SENSITIVE Sensitive     OXACILLIN >=4 RESISTANT Resistant     TETRACYCLINE <=1 SENSITIVE Sensitive     VANCOMYCIN 1 SENSITIVE Sensitive     TRIMETH/SULFA <=10 SENSITIVE Sensitive     CLINDAMYCIN <=0.25 SENSITIVE Sensitive     RIFAMPIN <=0.5 SENSITIVE Sensitive     Inducible Clindamycin NEGATIVE Sensitive     * FEW METHICILLIN RESISTANT STAPHYLOCOCCUS AUREUS  Culture, blood (Routine X 2) w Reflex to ID Panel     Status: None   Collection Time: 10/09/18 10:35 AM  Result Value Ref Range Status   Specimen Description   Final    BLOOD LEFT ARM Performed at [facility] [address] [place], [place] [address]    Special Requests   Final    BAA BCAV Performed at [facility] [address] [place], [place] [address]    Culture   Final    NO GROWTH 5 DAYS Performed at [facility], [place] [address], [place], [place] [address]    Report Status 10/14/2018 FINAL  Final  Culture, blood (Routine X 2) w Reflex to ID Panel     Status: None   Collection Time: 10/09/18 10:41 AM  Result Value Ref Range Status   Specimen Description   Final    BLOOD RIGHT HAND Performed at [facility] [address], [place], [place] [address]    Special Requests   Final    BAA BCAV Performed at [facility] [address] [place], [place] [address]    Culture   Final    NO GROWTH 5 DAYS Performed at [facility], [place] [address], [place], [place] [address]    Report Status 10/14/2018 FINAL  Final    Studies/Results: Ct Abdomen  Pelvis Wo Contrast  Result Date: 10/14/2018 CLINICAL DATA:  58 year old male with history of constipation. History of discitis and osteomyelitis at T10-T12 with right paraspinal phlegmon. EXAM: CT ABDOMEN AND PELVIS WITHOUT CONTRAST TECHNIQUE: Multidetector CT imaging of the abdomen and pelvis was performed following  the standard protocol without IV contrast. COMPARISON:  CT the abdomen and pelvis 09/11/2018. FINDINGS: Lower chest: Postoperative changes of partial chest wall resection in the lower left hemithorax incompletely imaged with some adjacent postoperative scarring. Mild scarring and/or subsegmental atelectasis in the right lower lobe dependently. Atherosclerotic calcifications in the right coronary artery and descending thoracic aorta. Hepatobiliary: No definite suspicious cystic or solid hepatic lesions are confidently identified on today's noncontrast CT examination. Status post cholecystectomy. Pancreas: No definite pancreatic mass or peripancreatic fluid or inflammatory changes are noted on today's noncontrast CT examination. Spleen: 2.0 x 1.4 cm low-attenuation lesion in the lower aspect of the spleen, similar to recent prior study 09/11/2018, but new compared to more remote prior examinations, nonspecific, but concerning for potential splenic abscess. Adrenals/Urinary Tract: 3 mm nonobstructive calculus in the lower pole collecting system of left kidney no additional calculi are noted within the collecting system of either kidney, along the course of either ureter, or within the lumen of the urinary bladder. No hydroureteronephrosis. Unenhanced appearance of the kidneys and bilateral adrenal glands is otherwise normal. Urinary bladder is normal in appearance. Stomach/Bowel: Normal appearance of the stomach. No pathologic dilatation of small bowel or colon. A few scattered colonic diverticulae are noted, without surrounding inflammatory changes to suggest an acute diverticulitis at this time. Relatively large volume of stool throughout the ascending colon and transverse colon. Paucity of stool throughout the remaining portions of colon and rectum which are otherwise rim relatively decompressed. No obstructing lesion identified. Normal appendix. Vascular/Lymphatic: Aortic atherosclerosis. No lymphadenopathy noted  in the abdomen or pelvis. Reproductive: Prostate gland and seminal vesicles are unremarkable in appearance. Other: No significant volume of ascites.  No pneumoperitoneum. Musculoskeletal: Disc height loss at T10-T11 with destructive changes in the adjacent vertebral body endplates, compatible with known discitis and osteomyelitis. Extensive right and anterior paravertebral soft tissue swelling best appreciated on axial image 12 of series 2. IMPRESSION: 1. Large amount of stool in the proximal to mid colon with decompression of the distal colon and rectum. No obstructing lesion identified. 2. No acute findings are noted in the abdomen or pelvis. 3. Changes of discitis and osteomyelitis at the T10-T11 interspace with extensive right-sided paravertebral soft tissue swelling which has increased compared to the prior study 09/11/2018. 4. Indeterminate low-attenuation lesion in the spleen, stable compared to the most recent prior CT, but new compared to more remote prior examinations. This is nonspecific, but in the setting of known discitis and osteomyelitis, the possibility of a splenic abscess could be considered. 5. 3 mm nonobstructive calculus in the lower pole collecting system of left kidney. No ureteral stones or findings of urinary tract obstruction are noted at this time. 6. Colonic diverticulosis without evidence of acute diverticulitis at this time. 7. Aortic atherosclerosis, in addition to at least right coronary artery disease. Please note that although the presence of coronary artery calcium documents the presence of coronary artery disease, the severity of this disease and any potential stenosis cannot be assessed on this non-gated CT examination. Assessment for potential risk factor modification, dietary therapy or pharmacologic therapy may be warranted, if clinically indicated. 8. Additional incidental findings, as above. Electronically Signed   By: Mauri [place].D.  On: 10/14/2018 10:19       Assessment/Plan:  INTERVAL HISTORY:  Reducing his celexa further and will start zyvox   Principal Problem:   Discitis Active Problems:   Essential hypertension   Hyperlipidemia, mixed   Hepatitis C, chronic (HCC)   Tobacco use disorder   Prediabetes   MRSA bacteremia   Back pain   Subacute osteomyelitis, other site [facility])   IVDU (intravenous drug user)    Howard Fox is a 58 y.o. male with  T 10-11 diskitis, osteomyelitis and now found to have MRSA bacteremia. Howard Fox has Chronic HCV without hepatic coma and admitted to me prior injection drug use though claims Howard Fox has been clean since 2008 to me but other story to primary.  #1      [facility] Staphylococcus aureus bacteremia   Staphylococcus aureus bacteremia (SAB) is associated with a high rate of complications and mortality.  Specific aspects of clinical management are critical to optimizing the outcome of patients with SAB.  Therefore, the [facility] Antimicrobial Management Team [facility]) has initiated an intervention aimed at improving the management of SAB at [facility].  To do so, Infectious Diseases physicians are providing an evidence-based consult for the management of all patients with SAB.     Yes No Comments  Perform follow-up blood cultures (even if the patient is afebrile) to ensure clearance of bacteremia [x]  []  NO GROWTH REPEAT x 5 FINAL  Remove vascular catheter and obtain follow-up blood cultures after the removal of the catheter []  []  DO NOT PLACE PICC   Perform echocardiography to evaluate for endocarditis (transthoracic ECHO is 40-50% sensitive, TEE is > 90% sensitive) [x]  []  Please keep in mind, that neither test can definitively EXCLUDE endocarditis, and that should clinical suspicion remain high for endocarditis the patient should then still be treated with an "endocarditis" duration of therapy = 6 weeks  Consult electrophysiologist to evaluate implanted cardiac device  (pacemaker, ICD) []  []  NA  Ensure source control []  []  Have all abscesses been drained effectively? Have deep seeded infections (septic joints or osteomyelitis) had appropriate surgical debridement?  ? IVDU as source. Spine infected  Investigate for metastatic sites of infection []  []  Does the patient have ANY symptom or physical exam finding that would suggest a deeper infection (back or neck pain that may be suggestive of vertebral osteomyelitis or epidural abscess, muscle pain that could be a symptom of pyomyositis)?  Keep in mind that for deep seeded infections MRI imaging with contrast is preferred rather than other often insensitive tests such as plain x-rays, especially early in a patient's presentation. Spine is only clear site  Change antibiotic therapy to Zyvox []  []  Beta-lactam antibiotics are preferred for MSSA due to higher cure rates.   If on Vancomycin, goal trough should be 15 - 20 mcg/mL  Estimated duration of IV antibiotic therapy:    4 weeks of zyvox followed by 4 weeks of doxycycline []  []  Consult case management for probably prolonged outpatient IV antibiotic therapy   #2 Vertebral osteomyelitis: see above  #3 Hep C: happy to treat as an outpatient  #4 IVDU: admits to past use. THC + opiates + but on opiates as outpatient and given here as well. I have concern Howard Fox is still actively using   #5 Bipolar disorder: droppoing his celexa to 10mg  while I start Zyvox  Monitor for serotonin syndrome but also for discontinuation syndrome  I will arrange appt in my clinic in the next  2 weeks to see him and check a CBC  I would prefer it if Howard Fox can leave the hospital with 28 days of zyvox in hand         LOS: 6 days   Alcide Evener 10/14/2018, 3:03 PM
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
## 2018-10-14 NOTE — Progress Notes (Signed)
PROGRESS NOTE    Howard Fox  VEL:[id number] DOB: 08/20/1960 DOA: 10/07/2018 PCP: Unk Pinto, MD   Brief Narrative: Howard Fox is a 58 y.o. male with medical history significant of hypertension, hyperlipidemia, prediabetes, polytrauma years ago, chronic pain syndrome who presents to the emergency department with complaints of worsening back pain ,left-sided rib pain and mild grade fever. MRI, CT imagings done in the emergency department showed discitis/osteomyelitis at T10-T12 with right paraspinal phlegmon.  Blood culture showed MRSA.  Anaerobic/aerobic culture also showed MRSA.  ID following.  Currently on vancomycin. Difficult discharge planning due to his history of IV drug abuse.  ID thinking about Zyvox versus vancomycin via PICC line.  Waiting from ID recommendation on antibiotics for discharge. Hospital course remarkable for persistent constipation, abdominal discomfort.  GI consulted today   Assessment & Plan:   Principal Problem:   Discitis Active Problems:   Essential hypertension   Hyperlipidemia, mixed   Hepatitis C, chronic (HCC)   Tobacco use disorder   Prediabetes   MRSA bacteremia   Back pain   Subacute osteomyelitis, other site [facility])   IVDU (intravenous drug user)   Discitis/osteomyelitis: Presented with back pain, fever.  MRI thoracic spine showed severe marrow edema and enhancement in the T10 and T11 vertebral bodies  concerning for discitis-osteomyelitis at T10-11. Also showed severe anterior paravertebral soft tissue edema and enhancement at T11-12 without a drainable component most consistent with a phlegmon. Underwent  IR for drainage of the discitis area.  Aerobic/anaerobic culture showed MRSA. Back pain has improved.  Currently afebrile .Continue vancomycin  MRSA bacteremia: Continue vancomycin.  ID following.Patient reported that he had prior history of IVDU,last use was 3 years ago. ID planning to transition antibiotic to  Zyvox.  Constipation: Having severe abdominal discomfort this morning.  CT abdomen/pelvis done which showed large amount of stool in the proximal to mid colon with decompression of the distal colon and rectum. No obstructing lesion identified.GI consulted.Plan for SMOG enemas vs colonoscopy.Minimize narcotics.  Hypertension: Currently blood pressure stable.  Resume home medications.  Hyperlipidemia: Continue Crestor  Hepatitis C: Follows as an outpatient with liver clinic.  Not treated yet.  We recommend continued follow-up as an outpatient.Follow up HCV RNA.  Smoker: Smokes half pack a day for last several years.  Counseled for smoking cessation.  Continue  nicotine patch.  Chronic alcohol abuse: Drinks 3 bottles of 12 ounce beer every day.  Counseled for cessation.  Monitor for withdrawal.  Prediabetes: Not on medications at home.  Hemoglobin A1c level of 5.7  Chronic pain syndrome/chronic back pain/anxiety: Follows with orthopedics as an outpatient.  Continue supportive care.  Has history of muscle spasms.  Resume his home meds  Persistent left subcostal pain:Patient complains of persistent pain on the left subcostal region.  He has history of polytrauma in the past.  This pain is chronic. Abd Xray showed: Multiple old left-sided rib fractures , status post fixation. Many of the wires are fractured. Continue pain management.Follow up as an outpatient.         DVT prophylaxis:ScD Code Status: Full Family Communication: Discussed with mother on phone yesterday. Disposition Plan: Home after ID clearance   Consultants: ID  Procedures: Aspiration of discitis  Antimicrobials:  Anti-infectives (From admission, onward)   Start     Dose/Rate Route Frequency Ordered Stop   10/11/18 2200  vancomycin (VANCOCIN) 1,500 mg in sodium chloride 0.9 % 500 mL IVPB     1,500 mg 250 mL/hr over 120 Minutes  Intravenous Every 12 hours 10/11/18 1102     10/09/18 2200  vancomycin (VANCOCIN)  1,250 mg in sodium chloride 0.9 % 250 mL IVPB  Status:  Discontinued     1,250 mg 166.7 mL/hr over 90 Minutes Intravenous Every 12 hours 10/09/18 0803 10/11/18 1102   10/09/18 0800  vancomycin (VANCOCIN) 2,500 mg in sodium chloride 0.9 % 500 mL IVPB     2,500 mg 250 mL/hr over 120 Minutes Intravenous  Once 10/09/18 0745 10/09/18 1154   10/08/18 0845  vancomycin (VANCOCIN) IVPB 1000 mg/200 mL premix  Status:  Discontinued     1,000 mg 200 mL/hr over 60 Minutes Intravenous  Once 10/08/18 0835 10/08/18 0908      Subjective: Patient seen and examined the bedside this morning.  Complains of severe abdominal discomfort.  Was in severe distress due to abdominal bloating.  Objective: Vitals:   10/13/18 0604 10/13/18 1354 10/13/18 2114 10/14/18 0632  BP: 116/61 118/75 124/72 122/75  Pulse: 73 75 79 78  Resp: 18 18 18 18   Temp: 97.8 F (36.6 C) 97.7 F (36.5 C) 98.2 F (36.8 C) 98.5 F (36.9 C)  TempSrc: Oral Oral Oral Oral  SpO2: 95% 95% 100% 100%  Weight:      Height:        Intake/Output Summary (Last 24 hours) at 10/14/2018 1048 Last data filed at 10/14/2018 0700 Gross per 24 hour  Intake 2400 ml  Output 2525 ml  Net -125 ml   Filed Weights   10/07/18 2130  Weight: 108.9 kg    Examination:  General exam: Appears calm and comfortable ,Not in distress,average built HEENT:PERRL,Oral mucosa moist, Ear/Nose normal on gross exam Respiratory system: Bilateral equal air entry, normal vesicular breath sounds, no wheezes or crackles  Cardiovascular system: S1 & S2 heard, RRR. No JVD, murmurs, rubs, gallops or clicks. Gastrointestinal system: Abdomen is distended, tense and has generalized tenderness today. No organomegaly or masses felt. Normal bowel sounds heard. Central nervous system: Alert and oriented. No focal neurological deficits. Extremities: No edema, no clubbing ,no cyanosis, distal peripheral pulses palpable. Skin: No rashes, lesions or ulcers,no icterus ,no  pallor MSK: Normal muscle bulk,tone ,power Psychiatry: Judgement and insight appear normal. Mood & affect appropriate.         Data Reviewed: I have personally reviewed following labs and imaging studies  CBC: Recent Labs  Lab 10/07/18 2150 10/09/18 0419 10/10/18 0451  WBC 10.4 12.5* 9.0  NEUTROABS 7.2  --  5.8  HGB 13.4 13.3 12.9*  HCT 41.2 41.3 39.6  MCV 95.8 96.5 96.1  PLT 271 282 846   Basic Metabolic Panel: Recent Labs  Lab 10/07/18 2150 10/09/18 0419 10/10/18 0451 10/11/18 1023 10/12/18 0428 10/13/18 0355 10/14/18 0412  NA 137 132* 135  --   --   --   --   K 4.3 4.1 4.6  --   --   --   --   CL 104 98 100  --   --   --   --   CO2 25 23 25   --   --   --   --   GLUCOSE 99 115* 111*  --   --   --   --   BUN 13 18 18   --   --   --   --   CREATININE 0.78 0.88 1.01 0.95 0.82 0.72 0.80  CALCIUM 9.2 9.1 9.2  --   --   --   --   MG  --   --  2.3  --   --   --   --    GFR: Estimated Creatinine Clearance: 127.8 mL/min (by C-G formula based on SCr of 0.8 mg/dL). Liver Function Tests: Recent Labs  Lab 10/07/18 2150  AST 18  ALT 18  ALKPHOS 84  BILITOT 0.4  PROT 7.5  ALBUMIN 3.4*   Recent Labs  Lab 10/07/18 2150  LIPASE 21   No results for input(s): AMMONIA in the last 168 hours. Coagulation Profile: Recent Labs  Lab 10/08/18 1057  INR 1.0   Cardiac Enzymes: Recent Labs  Lab 10/07/18 2150  TROPONINI <0.03   BNP (last 3 results) No results for input(s): PROBNP in the last 8760 hours. HbA1C: No results for input(s): HGBA1C in the last 72 hours. CBG: No results for input(s): GLUCAP in the last 168 hours. Lipid Profile: No results for input(s): CHOL, HDL, LDLCALC, TRIG, CHOLHDL, LDLDIRECT in the last 72 hours. Thyroid Function Tests: No results for input(s): TSH, T4TOTAL, FREET4, T3FREE, THYROIDAB in the last 72 hours. Anemia Panel: No results for input(s): VITAMINB12, FOLATE, FERRITIN, TIBC, IRON, RETICCTPCT in the last 72 hours. Sepsis  Labs: No results for input(s): PROCALCITON, LATICACIDVEN in the last 168 hours.  Recent Results (from the past 240 hour(s))  SARS Coronavirus 2 (CEPHEID - Performed in [place] hospital lab), Hosp Order     Status: None   Collection Time: 10/07/18 10:28 PM  Result Value Ref Range Status   SARS Coronavirus 2 NEGATIVE NEGATIVE Final    Comment: (NOTE) If result is NEGATIVE SARS-CoV-2 target nucleic acids are NOT DETECTED. The SARS-CoV-2 RNA is generally detectable in upper and lower  respiratory specimens during the acute phase of infection. The lowest  concentration of SARS-CoV-2 viral copies this assay can detect is 250  copies / mL. A negative result does not preclude SARS-CoV-2 infection  and should not be used as the sole basis for treatment or other  patient management decisions.  A negative result may occur with  improper specimen collection / handling, submission of specimen other  than nasopharyngeal swab, presence of viral mutation(s) within the  areas targeted by this assay, and inadequate number of viral copies  (<250 copies / mL). A negative result must be combined with clinical  observations, patient history, and epidemiological information. If result is POSITIVE SARS-CoV-2 target nucleic acids are DETECTED. The SARS-CoV-2 RNA is generally detectable in upper and lower  respiratory specimens dur ing the acute phase of infection.  Positive  results are indicative of active infection with SARS-CoV-2.  Clinical  correlation with patient history and other diagnostic information is  necessary to determine patient infection status.  Positive results do  not rule out bacterial infection or co-infection with other viruses. If result is PRESUMPTIVE POSTIVE SARS-CoV-2 nucleic acids MAY BE PRESENT.   A presumptive positive result was obtained on the submitted specimen  and confirmed on repeat testing.  While 2019 novel coronavirus  (SARS-CoV-2) nucleic acids may be present in  the submitted sample  additional confirmatory testing may be necessary for epidemiological  and / or clinical management purposes  to differentiate between  SARS-CoV-2 and other Sarbecovirus currently known to infect humans.  If clinically indicated additional testing with an alternate test  methodology [phone]) is advised. The SARS-CoV-2 RNA is generally  detectable in upper and lower respiratory sp ecimens during the acute  phase of infection. The expected result is Negative. Fact Sheet for Patients:  StrictlyIdeas.no Fact Sheet for Healthcare Providers: BankingDealers.co.za This test  is not yet approved or cleared by the [place] and has been authorized for detection and/or diagnosis of SARS-CoV-2 by FDA under an Emergency Use Authorization (EUA).  This EUA will remain in effect (meaning this test can be used) for the duration of the COVID-19 declaration under Section 564(b)(1) of the Act, 21 U.S.C. section 360bbb-3(b)(1), unless the authorization is terminated or revoked sooner. Performed at [facility], [place] [address], [place], [place] [address]   Culture, blood (Routine X 2) w Reflex to ID Panel     Status: Abnormal   Collection Time: 10/08/18  8:54 AM  Result Value Ref Range Status   Specimen Description   Final    LEFT ANTECUBITAL Performed at [place] [address] [place], [place] [address]    Special Requests   Final    BOTTLES DRAWN AEROBIC AND ANAEROBIC Blood Culture adequate volume Performed at [place] [address], [place], [place] [address]    Culture  Setup Time   Final    GRAM POSITIVE COCCI IN BOTH AEROBIC AND ANAEROBIC BOTTLES CRITICAL RESULT CALLED TO, READ BACK BY AND VERIFIED WITH: M. RENZ PHARMD, AT 0706 10/09/18 BY Rush Landmark Performed at [facility], [place] [address], [place], [place] [address]    Culture METHICILLIN  RESISTANT STAPHYLOCOCCUS AUREUS (A)  Final   Report Status 10/13/2018 FINAL  Final   Organism ID, Bacteria METHICILLIN RESISTANT STAPHYLOCOCCUS AUREUS  Final      Susceptibility   Methicillin resistant staphylococcus aureus - MIC*    CIPROFLOXACIN >=8 RESISTANT Resistant     ERYTHROMYCIN >=8 RESISTANT Resistant     GENTAMICIN <=0.5 SENSITIVE Sensitive     OXACILLIN RESISTANT Resistant     TETRACYCLINE <=1 SENSITIVE Sensitive     VANCOMYCIN <=0.5 SENSITIVE Sensitive     TRIMETH/SULFA <=10 SENSITIVE Sensitive     CLINDAMYCIN <=0.25 SENSITIVE Sensitive     RIFAMPIN <=0.5 SENSITIVE Sensitive     Inducible Clindamycin NEGATIVE Sensitive     * METHICILLIN RESISTANT STAPHYLOCOCCUS AUREUS  Blood Culture ID Panel (Reflexed)     Status: Abnormal   Collection Time: 10/08/18  8:54 AM  Result Value Ref Range Status   Enterococcus species NOT DETECTED NOT DETECTED Final   Listeria monocytogenes NOT DETECTED NOT DETECTED Final   Staphylococcus species DETECTED (A) NOT DETECTED Final    Comment: CRITICAL RESULT CALLED TO, READ BACK BY AND VERIFIED WITH: M. RENZ PHARMD, AT 0706 10/09/18 BY D. VANHOOK    Staphylococcus aureus (BCID) DETECTED (A) NOT DETECTED Final    Comment: Methicillin (oxacillin)-resistant Staphylococcus aureus (MRSA). MRSA is predictably resistant to beta-lactam antibiotics (except ceftaroline). Preferred therapy is vancomycin unless clinically contraindicated. Patient requires contact precautions if  hospitalized. CRITICAL RESULT CALLED TO, READ BACK BY AND VERIFIED WITH: M. RENZ PHARMD, AT 9924 10/09/18 BY D. VANHOOK    Methicillin resistance DETECTED (A) NOT DETECTED Final    Comment: CRITICAL RESULT CALLED TO, READ BACK BY AND VERIFIED WITH: M. RENZ PHARMD, AT 0706 10/09/18 BY D. VANHOOK    Streptococcus species NOT DETECTED NOT DETECTED Final   Streptococcus agalactiae NOT DETECTED NOT DETECTED Final   Streptococcus pneumoniae NOT DETECTED NOT DETECTED Final   Streptococcus  pyogenes NOT DETECTED NOT DETECTED Final   Acinetobacter baumannii NOT DETECTED NOT DETECTED Final   Enterobacteriaceae species NOT DETECTED NOT DETECTED Final   Enterobacter cloacae complex NOT DETECTED NOT DETECTED Final   Escherichia coli NOT DETECTED NOT  DETECTED Final   Klebsiella oxytoca NOT DETECTED NOT DETECTED Final   Klebsiella pneumoniae NOT DETECTED NOT DETECTED Final   Proteus species NOT DETECTED NOT DETECTED Final   Serratia marcescens NOT DETECTED NOT DETECTED Final   Haemophilus influenzae NOT DETECTED NOT DETECTED Final   Neisseria meningitidis NOT DETECTED NOT DETECTED Final   Pseudomonas aeruginosa NOT DETECTED NOT DETECTED Final   Candida albicans NOT DETECTED NOT DETECTED Final   Candida glabrata NOT DETECTED NOT DETECTED Final   Candida krusei NOT DETECTED NOT DETECTED Final   Candida parapsilosis NOT DETECTED NOT DETECTED Final   Candida tropicalis NOT DETECTED NOT DETECTED Final    Comment: Performed at [facility], [place] [address] [place], [place] [address]  Culture, blood (Routine X 2) w Reflex to ID Panel     Status: Abnormal   Collection Time: 10/08/18  8:59 AM  Result Value Ref Range Status   Specimen Description   Final    RIGHT ANTECUBITAL Performed at [place] [address], [place], [place] [address]    Special Requests   Final    BOTTLES DRAWN AEROBIC AND ANAEROBIC Blood Culture adequate volume Performed at [place] [address] [place], [place] [address]    Culture  Setup Time   Final    GRAM POSITIVE COCCI IN CLUSTERS ANAEROBIC BOTTLE ONLY CRITICAL VALUE NOTED.  VALUE IS CONSISTENT WITH PREVIOUSLY REPORTED AND CALLED VALUE.    Culture (A)  Final    STAPHYLOCOCCUS AUREUS SUSCEPTIBILITIES PERFORMED ON PREVIOUS CULTURE WITHIN THE LAST 5 DAYS. Performed at [facility], [place] [address], [place], [place] [address]    Report Status 10/11/2018 FINAL  Final  Aerobic/Anaerobic Culture  (surgical/deep wound)     Status: None   Collection Time: 10/08/18 12:33 PM  Result Value Ref Range Status   Specimen Description   Final    ABSCESS SPINE Performed at [place] [address] [place], [place] [address]    Special Requests   Final    NONE Performed at [facility], [place] [address] [place], [place] [address]    Gram Stain   Final    ABUNDANT WBC PRESENT,BOTH PMN AND MONONUCLEAR RARE GRAM POSITIVE COCCI    Culture   Final    FEW METHICILLIN RESISTANT STAPHYLOCOCCUS AUREUS NO ANAEROBES ISOLATED Performed at [facility], [place] [address], [place], [place] [address]    Report Status 10/13/2018 FINAL  Final   Organism ID, Bacteria METHICILLIN RESISTANT STAPHYLOCOCCUS AUREUS  Final      Susceptibility   Methicillin resistant staphylococcus aureus - MIC*    CIPROFLOXACIN >=8 RESISTANT Resistant     ERYTHROMYCIN >=8 RESISTANT Resistant     GENTAMICIN <=0.5 SENSITIVE Sensitive     OXACILLIN >=4 RESISTANT Resistant     TETRACYCLINE <=1 SENSITIVE Sensitive     VANCOMYCIN 1 SENSITIVE Sensitive     TRIMETH/SULFA <=10 SENSITIVE Sensitive     CLINDAMYCIN <=0.25 SENSITIVE Sensitive     RIFAMPIN <=0.5 SENSITIVE Sensitive     Inducible Clindamycin NEGATIVE Sensitive     * FEW METHICILLIN RESISTANT STAPHYLOCOCCUS AUREUS  Culture, blood (Routine X 2) w Reflex to ID Panel     Status: None (Preliminary result)   Collection Time: 10/09/18 10:35 AM  Result Value Ref Range Status   Specimen Description   Final    BLOOD LEFT ARM Performed at [facility] [address] [place],  [place] [address]    Special Requests   Final    BAA BCAV Performed at [facility], [place] [address], [place], [place] [address]    Culture   Final    NO GROWTH 4 DAYS Performed at [facility], [place] [address] [place], [place] [address]    Report Status PENDING  Incomplete  Culture, blood (Routine X 2) w  Reflex to ID Panel     Status: None (Preliminary result)   Collection Time: 10/09/18 10:41 AM  Result Value Ref Range Status   Specimen Description   Final    BLOOD RIGHT HAND Performed at [facility] [address] [place], [place] [address]    Special Requests   Final    BAA BCAV Performed at [facility] [address], [place],  [address]    Culture   Final    NO GROWTH 4 DAYS Performed at [facility], [place] [address], [place],  [address]    Report Status PENDING  Incomplete         Radiology Studies: Ct Abdomen Pelvis Wo Contrast  Result Date: 10/14/2018 CLINICAL DATA:  58 year old male with history of constipation. History of discitis and osteomyelitis at T10-T12 with right paraspinal phlegmon. EXAM: CT ABDOMEN AND PELVIS WITHOUT CONTRAST TECHNIQUE: Multidetector CT imaging of the abdomen and pelvis was performed following the standard protocol without IV contrast. COMPARISON:  CT the abdomen and pelvis 09/11/2018. FINDINGS: Lower chest: Postoperative changes of partial chest wall resection in the lower left hemithorax incompletely imaged with some adjacent postoperative scarring. Mild scarring and/or subsegmental atelectasis in the right lower lobe dependently. Atherosclerotic calcifications in the right coronary artery and descending thoracic aorta. Hepatobiliary: No definite suspicious cystic or solid hepatic lesions are confidently identified on today's noncontrast CT examination. Status post cholecystectomy. Pancreas: No definite pancreatic mass or peripancreatic fluid or inflammatory changes are noted on today's noncontrast CT examination. Spleen: 2.0 x 1.4 cm low-attenuation lesion in the lower aspect of the spleen, similar to recent prior study 09/11/2018, but new compared to more remote prior examinations, nonspecific, but concerning for potential splenic abscess. Adrenals/Urinary Tract: 3 mm nonobstructive calculus  in the lower pole collecting system of left kidney no additional calculi are noted within the collecting system of either kidney, along the course of either ureter, or within the lumen of the urinary bladder. No hydroureteronephrosis. Unenhanced appearance of the kidneys and bilateral adrenal glands is otherwise normal. Urinary bladder is normal in appearance. Stomach/Bowel: Normal appearance of the stomach. No pathologic dilatation of small bowel or colon. A few scattered colonic diverticulae are noted, without surrounding inflammatory changes to suggest an acute diverticulitis at this time. Relatively large volume of stool throughout the ascending colon and transverse colon. Paucity of stool throughout the remaining portions of colon and rectum which are otherwise rim relatively decompressed. No obstructing lesion identified. Normal appendix. Vascular/Lymphatic: Aortic atherosclerosis. No lymphadenopathy noted in the abdomen or pelvis. Reproductive: Prostate gland and seminal vesicles are unremarkable in appearance. Other: No significant volume of ascites.  No pneumoperitoneum. Musculoskeletal: Disc height loss at T10-T11 with destructive changes in the adjacent vertebral body endplates, compatible with known discitis and osteomyelitis. Extensive right and anterior paravertebral soft tissue swelling best appreciated on axial image 12 of series 2. IMPRESSION: 1. Large amount of stool in the proximal to mid colon with decompression of the distal colon and rectum. No obstructing lesion identified. 2. No acute findings are noted  in the abdomen or pelvis. 3. Changes of discitis and osteomyelitis at the T10-T11 interspace with extensive right-sided paravertebral soft tissue swelling which has increased compared to the prior study 09/11/2018. 4. Indeterminate low-attenuation lesion in the spleen, stable compared to the most recent prior CT, but new compared to more remote prior examinations. This is nonspecific, but in  the setting of known discitis and osteomyelitis, the possibility of a splenic abscess could be considered. 5. 3 mm nonobstructive calculus in the lower pole collecting system of left kidney. No ureteral stones or findings of urinary tract obstruction are noted at this time. 6. Colonic diverticulosis without evidence of acute diverticulitis at this time. 7. Aortic atherosclerosis, in addition to at least right coronary artery disease. Please note that although the presence of coronary artery calcium documents the presence of coronary artery disease, the severity of this disease and any potential stenosis cannot be assessed on this non-gated CT examination. Assessment for potential risk factor modification, dietary therapy or pharmacologic therapy may be warranted, if clinically indicated. 8. Additional incidental findings, as above. Electronically Signed   By: Vinnie Langton M.D.   On: 10/14/2018 10:19        Scheduled Meds:  amitriptyline  150 mg Oral QHS   aspirin EC  325 mg Oral Daily   citalopram  20 mg Oral Daily   gabapentin  300 mg Oral TID   lisinopril  10 mg Oral Daily   And   hydrochlorothiazide  12.5 mg Oral Daily   nicotine  14 mg Transdermal Daily   polyethylene glycol  17 g Oral BID   rosuvastatin  40 mg Oral Daily   senna-docusate  1 tablet Oral BID   Continuous Infusions:  vancomycin 1,500 mg (10/14/18 0955)     LOS: 6 days    Time spent: 25 mins.More than 50% of that time was spent in counseling and/or coordination of care.      Shelly Coss, MD [facility] Pager [phone]  If 7PM-7AM, please contact night-coverage [email or web address] Password [email or web address] 10/14/2018, 10:48 AM

## 2018-10-15 LAB — CREATININE, SERUM
Creatinine, Ser: 0.86 mg/dL (ref 0.61–1.24)
GFR calc Af Amer: >60 mL/min (ref >60)
GFR calc non Af Amer: >60 mL/min (ref >60)

## 2018-10-15 MED ORDER — POLYETHYLENE GLYCOL 3350 17 G PO PACK: 17.0000 g | PACK | Freq: Every day | ORAL | 0 refills | Status: AC | PRN

## 2018-10-15 MED ORDER — CITALOPRAM HYDROBROMIDE 10 MG PO TABS: 10.0000 mg | ORAL_TABLET | Freq: Every day | ORAL | 0 refills | Status: DC

## 2018-10-15 MED ORDER — LINEZOLID 600 MG PO TABS: 600.0000 mg | ORAL_TABLET | Freq: Two times a day (BID) | ORAL | 0 refills | Status: DC

## 2018-10-15 MED ORDER — SENNOSIDES-DOCUSATE SODIUM 8.6-50 MG PO TABS: 1.0000 | ORAL_TABLET | Freq: Two times a day (BID) | ORAL | 0 refills | Status: DC

## 2018-10-15 NOTE — Discharge Summary (Signed)
Physician Discharge Summary  Howard Fox IPJ:[id number] DOB: 1961/01/11 DOA: 10/07/2018  PCP: Unk Pinto, MD  Admit date: 10/07/2018 Discharge date: 10/15/2018  Admitted From: Home Disposition:  Home  Discharge Condition:Stable CODE STATUS:FULL Diet recommendation: Heart Healthy   Brief/Interim Summary:  Howard Fox a 58 y.o.malewith medical history significant ofhypertension, hyperlipidemia, prediabetes, polytrauma years ago, chronic pain syndrome who presents to the emergency department with complaints of worsening back pain,left-sided rib painandmild grade fever. MRI, CT imagings done in the emergency department showed discitis/osteomyelitis at T10-T12 with right paraspinal phlegmon.  Blood culture showed MRSA.  Anaerobic/aerobic culture also showed MRSA.  ID following.  Started  on vancomycin. Difficult discharge planning due to his history of IV drug abuse. Hospital course remarkable for persistent constipation, abdominal discomfort.Finally he had bowel movements. ID decided to discharge him on Zyvox.  He will be discharged today to home.  He will follow-up with ID as an outpatient.  Following problems were addressed during his hospitalization:  Discitis/osteomyelitis:Presented with back pain, fever. MRI thoracic spine showed severe marrow edema and enhancement in the T10 and T11 vertebral bodies concerning for discitis-osteomyelitis at T10-11.Also showed severe anterior paravertebral soft tissue edema and enhancement at T11-12 without a drainable component most consistent with a phlegmon. Underwent  IR for drainage of the discitisarea. Aerobic/anaerobic culture showed MRSA. Back pain has improved.  Currently afebrile .Was on  vancomycin  MRSA bacteremia: On vancomycin.  ID following.Patient reported that he had prior history of IVDU,last use was 3 years ago.  Repeat blood cultures have remained negative. ID decided to change antibiotic to Zyvox.  He will  follow-up with ID as an outpatient.  Constipation:  CT abdomen/pelvis done which showed large amount of stool in the proximal to mid colon with decompression of the distal colon and rectum. No obstructing lesion identified.GI consulted.Given SMOG enemas .Finally had bowel movement.Minimize narcotics.  Hypertension:Currently blood pressure stable. Resume home medications.  Hyperlipidemia: Continue Crestor  Hepatitis C:Follows as an outpatient with liver clinic. Not treated yet.We recommend continued follow-up as an outpatient.Follow up HCV RNA.  Smoker: Smokes half pack a day for last several years. Counseled for smoking cessation. Continue  nicotine patch.  Chronic alcohol abuse: Drinks 3 bottles of 12 ounce beer every day. Counseled for cessation.   Prediabetes:Not on medications at home. Hemoglobin A1c level of 5.7  Chronic pain syndrome/chronic back pain/anxiety:Follows with orthopedics as an outpatient. Continue supportive care. Has history of muscle spasms. Resume his home meds  Persistent left subcostal pain:Patient complains of persistent pain on the left subcostal region.  He has history of polytrauma in the past.  This pain is chronic. Abd Xray showed: Multiple old left-sided rib fractures , status post fixation. Many of the wires are fractured. Continue pain management.Follow up as an outpatient.   Discharge Diagnoses:  Principal Problem:   Discitis Active Problems:   Essential hypertension   Hyperlipidemia, mixed   Hepatitis C, chronic (HCC)   Tobacco use disorder   Prediabetes   MRSA bacteremia   Back pain   Subacute osteomyelitis, other site [facility])   IVDU (intravenous drug user)    Discharge Instructions  Discharge Instructions    Diet - low sodium heart healthy   Complete by:  As directed    Discharge instructions   Complete by:  As directed    1)Follow up with your PCP in a week. 2)Follow up with infectious disease in 2 weeks. You  will be called for appointment. 3)Take prescribed medications as instructed.  Increase activity slowly   Complete by:  As directed      Allergies as of 10/15/2018      Reactions   Codeine Itching   Dilaudid [hydromorphone Hcl] Itching      Medication List    STOP taking these medications   doxycycline 100 MG capsule Commonly known as:  VIBRAMYCIN   oxyCODONE-acetaminophen 10-325 MG tablet Commonly known as:  PERCOCET     TAKE these medications   albuterol 108 (90 Base) MCG/ACT inhaler Commonly known as:  VENTOLIN HFA INHALE 2 PUFFS BY MOUTH EVERY 4 HOURS AS NEEDED FOR WHEEZING AND FOR SHORTNESS OF BREATH   ALPRAZolam 1 MG tablet Commonly known as:  XANAX Take 1/2-1 tablet 2 - 3 x /day ONLY if needed for Anxiety Attack &  limit to 5 days /week to avoid addiction What changed:    how much to take  how to take this  when to take this  additional instructions   amitriptyline 150 MG tablet Commonly known as:  ELAVIL Take 1 tablet at Bedtime for Sleep & Mood What changed:    how much to take  how to take this  when to take this  additional instructions   aspirin EC 325 MG tablet Take 325 mg by mouth daily.   carisoprodol 350 MG tablet Commonly known as:  SOMA Take 350 mg by mouth every 6 (six) hours as needed for muscle spasms.   citalopram 10 MG tablet Commonly known as:  CELEXA Take 1 tablet (10 mg total) by mouth daily. Start taking on:  Oct 16, 2018 What changed:    medication strength  how much to take   gabapentin 300 MG capsule Commonly known as:  NEURONTIN Take 1 capsule (300 mg total) by mouth 3 (three) times daily as needed. For pain.   HYDROcodone-acetaminophen 5-325 MG tablet Commonly known as:  Norco Take 1 tablet by mouth every 6 (six) hours as needed (for pain).   ibuprofen 200 MG tablet Commonly known as:  ADVIL Take 800 mg by mouth every 4 (four) hours as needed for moderate pain. Takes 6 tablets every four hours   Icy Hot  Advanced Relief 16-11 % Crea Generic drug:  Menthol-Camphor Apply 1 application topically 3 (three) times daily as needed (pain).   linezolid 600 MG tablet Commonly known as:  ZYVOX Take 1 tablet (600 mg total) by mouth every 12 (twelve) hours.   lisinopril-hydrochlorothiazide 20-25 MG tablet Commonly known as:  ZESTORETIC TAKE ONE TABLET BY MOUTH ONCE DAILY What changed:  how much to take   polyethylene glycol 17 g packet Commonly known as:  MIRALAX / GLYCOLAX Take 17 g by mouth daily as needed.   rosuvastatin 40 MG tablet Commonly known as:  Crestor Take 1/2 to 1 tablet daily or as directed for Cholesterol What changed:    how much to take  how to take this  when to take this  additional instructions   senna-docusate 8.6-50 MG tablet Commonly known as:  Senokot-S Take 1 tablet by mouth 2 (two) times daily.   Vitamin B-12 1000 MCG Subl Place 1,000 mcg under the tongue every other day.   Vitamin D 50 MCG (2000 UT) tablet Take 10,000 Units by mouth daily.      Follow-up Information    Unk Pinto, MD. Schedule an appointment as soon as possible for a visit in 1 week(s).   Specialty:  Internal Medicine Contact information: [address] [place] [address] [phone]  Allergies  Allergen Reactions  . Codeine Itching  . Dilaudid [Hydromorphone Hcl] Itching    Consultations:  ID   Procedures/Studies: Ct Abdomen Pelvis Wo Contrast  Result Date: 10/14/2018 CLINICAL DATA:  58 year old male with history of constipation. History of discitis and osteomyelitis at T10-T12 with right paraspinal phlegmon. EXAM: CT ABDOMEN AND PELVIS WITHOUT CONTRAST TECHNIQUE: Multidetector CT imaging of the abdomen and pelvis was performed following the standard protocol without IV contrast. COMPARISON:  CT the abdomen and pelvis 09/11/2018. FINDINGS: Lower chest: Postoperative changes of partial chest wall resection in the lower left  hemithorax incompletely imaged with some adjacent postoperative scarring. Mild scarring and/or subsegmental atelectasis in the right lower lobe dependently. Atherosclerotic calcifications in the right coronary artery and descending thoracic aorta. Hepatobiliary: No definite suspicious cystic or solid hepatic lesions are confidently identified on today's noncontrast CT examination. Status post cholecystectomy. Pancreas: No definite pancreatic mass or peripancreatic fluid or inflammatory changes are noted on today's noncontrast CT examination. Spleen: 2.0 x 1.4 cm low-attenuation lesion in the lower aspect of the spleen, similar to recent prior study 09/11/2018, but new compared to more remote prior examinations, nonspecific, but concerning for potential splenic abscess. Adrenals/Urinary Tract: 3 mm nonobstructive calculus in the lower pole collecting system of left kidney no additional calculi are noted within the collecting system of either kidney, along the course of either ureter, or within the lumen of the urinary bladder. No hydroureteronephrosis. Unenhanced appearance of the kidneys and bilateral adrenal glands is otherwise normal. Urinary bladder is normal in appearance. Stomach/Bowel: Normal appearance of the stomach. No pathologic dilatation of small bowel or colon. A few scattered colonic diverticulae are noted, without surrounding inflammatory changes to suggest an acute diverticulitis at this time. Relatively large volume of stool throughout the ascending colon and transverse colon. Paucity of stool throughout the remaining portions of colon and rectum which are otherwise rim relatively decompressed. No obstructing lesion identified. Normal appendix. Vascular/Lymphatic: Aortic atherosclerosis. No lymphadenopathy noted in the abdomen or pelvis. Reproductive: Prostate gland and seminal vesicles are unremarkable in appearance. Other: No significant volume of ascites.  No pneumoperitoneum. Musculoskeletal:  Disc height loss at T10-T11 with destructive changes in the adjacent vertebral body endplates, compatible with known discitis and osteomyelitis. Extensive right and anterior paravertebral soft tissue swelling best appreciated on axial image 12 of series 2. IMPRESSION: 1. Large amount of stool in the proximal to mid colon with decompression of the distal colon and rectum. No obstructing lesion identified. 2. No acute findings are noted in the abdomen or pelvis. 3. Changes of discitis and osteomyelitis at the T10-T11 interspace with extensive right-sided paravertebral soft tissue swelling which has increased compared to the prior study 09/11/2018. 4. Indeterminate low-attenuation lesion in the spleen, stable compared to the most recent prior CT, but new compared to more remote prior examinations. This is nonspecific, but in the setting of known discitis and osteomyelitis, the possibility of a splenic abscess could be considered. 5. 3 mm nonobstructive calculus in the lower pole collecting system of left kidney. No ureteral stones or findings of urinary tract obstruction are noted at this time. 6. Colonic diverticulosis without evidence of acute diverticulitis at this time. 7. Aortic atherosclerosis, in addition to at least right coronary artery disease. Please note that although the presence of coronary artery calcium documents the presence of coronary artery disease, the severity of this disease and any potential stenosis cannot be assessed on this non-gated CT examination. Assessment for potential risk factor modification, dietary therapy or  pharmacologic therapy may be warranted, if clinically indicated. 8. Additional incidental findings, as above. Electronically Signed   By: Vinnie Langton M.D.   On: 10/14/2018 10:19   Dg Abd 1 View  Result Date: 10/12/2018 CLINICAL DATA:  Mid to lower abdominal pain and constipation. EXAM: ABDOMEN - 1 VIEW COMPARISON:  Abdominal x-ray dated Oct 09, 2018. FINDINGS: Again  seen is a large amount of stool in the right colon. No significant air within the rectum. Mild gaseous distention of the splenic flexure, unchanged. Paucity of small bowel gas. Prior cholecystectomy. No acute osseous abnormality. IMPRESSION: 1. Continued large amount of stool in the right colon with mild gaseous distention of the splenic flexure. Electronically Signed   By: Titus Dubin M.D.   On: 10/12/2018 09:52   Mr Thoracic Spine W Wo Contrast  Result Date: 10/08/2018 CLINICAL DATA:  Pt has suspected osteomyelitis T10-T11. Pt stated he hurt is back six weeks ago working in garden. No previous back surgeries. EXAM: MRI THORACIC WITHOUT AND WITH CONTRAST TECHNIQUE: Multiplanar and multiecho pulse sequences of the thoracic spine were obtained without and with intravenous contrast. CONTRAST:  10 mL Gadavist COMPARISON:  CT chest 10/08/2018 CT abdomen 09/11/2018 FINDINGS: MRI THORACIC SPINE FINDINGS Alignment:  Physiologic. Vertebrae: Severe marrow edema in the T10 and T11 vertebral bodies with fluid signal within the T10-11 disc space and to lesser extent T11-12 disc space. Severe marrow edema and enhancement of the T10 and T11 vertebral bodies. Severe anterior paravertebral soft tissue edema and enhancement at T11-12 without a drainable component most consistent with a phlegmon. Mild epidural enhancement at T11-12 likely reflecting epidural inflammatory changes given the vertebral body abnormality without a drainable component to suggest an abscess. No other aggressive osseous lesion.  No acute fracture. Cord:  Normal signal and morphology. Paraspinal and other soft tissues: Severe paravertebral inflammatory changes at T10-11 without a drainable component. Small right pleural effusion. Disc levels: Disc spaces: Degenerative disc disease with disc height loss at C5-6 and C6-7 with broad-based disc osteophyte complex is on the sagittal images. T1-T2: No disc protrusion, foraminal stenosis or central canal  stenosis. T2-T3: Mild broad-based disc bulge. No foraminal or central canal stenosis. T3-T4: Mild broad-based disc bulge. No foraminal or central canal stenosis. T4-T5: No disc protrusion, foraminal stenosis or central canal stenosis. T5-T6: No disc protrusion, foraminal stenosis or central canal stenosis. T6-T7: No disc protrusion, foraminal stenosis or central canal stenosis. T7-T8: No disc protrusion, foraminal stenosis or central canal stenosis. T8-T9: No disc protrusion, foraminal stenosis or central canal stenosis. T9-T10: Small shallow right paracentral disc protrusion. No foraminal or central canal stenosis. T10-T11: No disc protrusion. Epidural inflammatory changes. Moderate bilateral facet arthropathy. Moderate right and mild left foraminal stenosis. T11-T12: No significant disc protrusion. Mild bilateral facet arthropathy. No significant foraminal stenosis. IMPRESSION: 1. Severe marrow edema and enhancement in the T10 and T11 vertebral bodies with fluid signal within the T10-11 disc space and to lesser extent T11-12 disc space most concerning for discitis-osteomyelitis at T10-11. Severe anterior paravertebral soft tissue edema and enhancement at T11-12 without a drainable component most consistent with a phlegmon. Mild epidural enhancement at T11-12 likely reflecting epidural inflammatory changes given the vertebral body abnormality without a drainable component to suggest an abscess. Electronically Signed   By: Kathreen Devoid   On: 10/08/2018 08:24   Ct Aspiration  Result Date: 10/08/2018 CLINICAL DATA:  Thoracic T10-T11 discitis/osteomyelitis with paraspinal phlegmon. EXAM: CT GUIDED ASPIRATION BIOPSY OF THORACIC PARASPINAL PHLEGMON ANESTHESIA/SEDATION: Intravenous Fentanyl 188mcg  and Versed 4mg  were administered as conscious sedation during continuous monitoring of the patient's level of consciousness and physiological / cardiorespiratory status by the radiology RN, with a total moderate sedation  time of 13 minutes. PROCEDURE: The procedure risks, benefits, and alternatives were explained to the patient. Questions regarding the procedure were encouraged and answered. The patient understands and consents to the procedure. Patient placed prone. Select axial scans through the lower thoracic spine obtained. The T10-11 level was localized and an appropriate skin entry site to the right paraspinal phlegmon was identified and marked. The operative field was prepped with chlorhexidinein a sterile fashion, and a sterile drape was applied covering the operative field. A sterile gown and sterile gloves were used for the procedure. Local anesthesia was provided with 1% Lidocaine. Under CT fluoroscopic guidance, a 18 gauge trocar needle was advanced into the right paraspinal phlegmon. Aspiration returned less than 2 mL bloody fluid, submitted for Gram stain and culture. The guide needle was removed. Postprocedure scans show no hemorrhage or other apparent complication. The patient tolerated the procedure well. COMPLICATIONS: None immediate FINDINGS: The disc changes at T10-T11 consistent with discitis/osteomyelitis were again identified. The paraspinal phlegmon was targeted and CT-guided aspiration sample obtained as above. Gram stain and culture pending. IMPRESSION: 1. Technically successful CT-guided aspiration of right paraspinal phlegmon T10-11. Electronically Signed   By: Lucrezia Europe M.D.   On: 10/08/2018 12:59   Ct T-spine No Charge  Result Date: 10/08/2018 CLINICAL DATA:  58 year old male with chest pain radiating to the back. EXAM: CT THORACIC SPINE WITH CONTRAST TECHNIQUE: Multiplanar CT images of the thoracic spine were reconstructed from contemporary CTA of the Chest. CONTRAST:  No additional. COMPARISON:  CTA chest, Abdomen, and Pelvis today are reported separately. Noncontrast CT Abdomen and Pelvis 09/11/2018. FINDINGS: Segmentation: Normal. Alignment: Overall preserved thoracic kyphosis. Mild levoconvex  scoliosis. Vertebrae: Partial destruction of the anterior vertebral bodies at T10-T11 (series 20, image 37) associated with regional paraspinal soft tissue thickening and hyper enhancement suspicious for discitis osteomyelitis with inflammatory phlegmon. The ventral and right paraspinal soft tissues are primarily affected, relatively sparing the periaortic soft tissues. There is mild associated retrocrural lymphadenopathy with 8 millimeter short axis nodes. No discrete fluid collection. The posterior T10 and T11 vertebral bodies and posterior elements appear to remain intact. There is degenerative appearing right T10 neural foraminal stenosis. Other thoracic vertebrae appear intact. The posterior right ribs appear intact. There are chronic postoperative changes to the left posterior ribs with cerclage wires. Paraspinal and other soft tissues: Abnormal at the right T10-T11 level. Other chest and abdominal visceral findings are reported separately today. Disc levels: Degenerative mild spinal stenosis suspected at T10-T11. IMPRESSION: 1. 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 Follow-up Thoracic Spine MRI without and with contrast may be valuable. 2. No other acute process identified in the thoracic spine. Superimposed degenerative spinal and right foraminal stenosis suspected at T10-T11. 3. CTA chest, Abdomen, and Pelvis today are reported separately. Electronically Signed   By: Genevie Ann M.D.   On: 10/08/2018 01:36   Dg Abd 2 Views  Result Date: 10/09/2018 CLINICAL DATA:  Pain. EXAM: ABDOMEN - 2 VIEW COMPARISON:  CT dated 10/08/2018 FINDINGS: There is a large amount of stool in the right hemicolon. There is gaseous distention of multiple loops of colon. There is no pneumatosis or evidence of free air. There is a relative paucity of bowel gas at the level of the rectum. Multiple  old left-sided rib fractures are noted, status post  fixation. Many of the wires are fractured. There are few lucencies overlying the liver, likely artifact. IMPRESSION: 1. Large amount of stool in the right hemicolon/cecum. There is mild gaseous distention of the transverse colon and splenic flexure. 2. Relative paucity of bowel gas at the level of the rectum. 3. Nonobstructive bowel gas pattern Electronically Signed   By: Constance Holster M.D.   On: 10/09/2018 20:04   Ct Angio Chest/abd/pel For Dissection W And/or Wo Contrast  Result Date: 10/08/2018 CLINICAL DATA:  58 year old male with chest pain radiating to the back. EXAM: CT ANGIOGRAPHY CHEST, ABDOMEN AND PELVIS TECHNIQUE: Multidetector CT imaging through the chest, abdomen and pelvis was performed using the standard protocol during bolus administration of intravenous contrast. Multiplanar reconstructed images and MIPs were obtained and reviewed to evaluate the vascular anatomy. CONTRAST:  140mL OMNIPAQUE IOHEXOL 350 MG/ML SOLN COMPARISON:  Noncontrast CT Abdomen and Pelvis 09/11/2018. Cardiac CTA 11/14/2008. FINDINGS: CTA CHEST FINDINGS Cardiovascular: Chronic surgical clips along the left mediastinum situated between the left pulmonary artery bifurcation and descending aorta. No calcified coronary artery atherosclerosis is evident. No thoracic aortic aneurysm or dissection. Minimal thoracic aortic atherosclerosis. Normal patency of the central pulmonary arteries. Stable cardiac size since 2010.  No pericardial effusion. Mediastinum/Nodes: Negative.  No lymphadenopathy. Lungs/Pleura: Major airways are patent. Left lower lobectomy suspected. There is chronic subpleural scarring and reticular opacity in both lungs. There is a trace layering right pleural effusion with new enhancing right costophrenic angle atelectasis. Musculoskeletal: Thoracic spine CT reformatted from these images is reported separately today (please see that report). Chronic left posterior rib resections. Review of the MIP images  confirms the above findings. CTA ABDOMEN AND PELVIS FINDINGS VASCULAR Major arterial structures are patent. There is iliofemoral atherosclerosis. Review of the MIP images confirms the above findings. NON-VASCULAR Hepatobiliary: Surgically absent gallbladder.  Negative liver. Pancreas: Negative. Spleen: Negative. Adrenals/Urinary Tract: Normal adrenal glands. The kidneys enhance symmetrically and appear negative. Proximal ureters are decompressed. Mildly distended but otherwise unremarkable urinary bladder. Stomach/Bowel: Redundant large bowel with retained stool and gas distended sigmoid. The cecum is on a lax mesentery in the anterior right upper abdomen. No large bowel inflammation. Normal appendix on series 5, image 153. Negative terminal ileum. No dilated small bowel. Negative stomach and duodenum. No free fluid, free air. Lymphatic: Mild retrocrural lymphadenopathy associated with the abnormal lower thoracic spine, see thoracic spine CT. Other lymph nodes are within normal limits. Reproductive: Negative. Other: No pelvic free fluid. Musculoskeletal: Lumbar segmentation appears normal and the lumbar spine appears intact. There is advanced lower lumbar disc, endplate, and facet degeneration. No acute osseous abnormality identified in the abdomen or pelvis. Review of the MIP images confirms the above findings. IMPRESSION: 1. Abnormal thoracic spine at the T10-T11 level, see dedicated thoracic spine CT reported separately. 2. Negative for aortic aneurysm or dissection. 3. No other acute or inflammatory process identified in the chest, abdomen, or pelvis. 4. Previous left lower lobectomy suspected.  Chronic lung disease. Electronically Signed   By: Genevie Ann M.D.   On: 10/08/2018 01:29       Subjective: Patient seen and examined bedside this morning.  Remains comfortable.  Had 3 bowel movements yesterday.  Stable for discharge.  Discharge Exam: Vitals:   10/14/18 2023 10/15/18 0429  BP: 131/78 120/74   Pulse: 80 72  Resp: 14 14  Temp: 98.3 F (36.8 C) 97.7 F (36.5 C)  SpO2: 96% 95%   Vitals:  10/14/18 0632 10/14/18 1329 10/14/18 2023 10/15/18 0429  BP: 122/75 134/72 131/78 120/74  Pulse: 78 77 80 72  Resp: 18 18 14 14   Temp: 98.5 F (36.9 C) 98.5 F (36.9 C) 98.3 F (36.8 C) 97.7 F (36.5 C)  TempSrc: Oral Oral Oral Oral  SpO2: 100% 100% 96% 95%  Weight:      Height:        General: Pt is alert, awake, not in acute distress Cardiovascular: RRR, S1/S2 +, no rubs, no gallops Respiratory: CTA bilaterally, no wheezing, no rhonchi Abdominal: Soft, NT, ND, bowel sounds + Extremities: no edema, no cyanosis    The results of significant diagnostics from this hospitalization (including imaging, microbiology, ancillary and laboratory) are listed below for reference.     Microbiology: Recent Results (from the past 240 hour(s))  SARS Coronavirus 2 (CEPHEID - Performed in [place] hospital lab), Hosp Order     Status: None   Collection Time: 10/07/18 10:28 PM  Result Value Ref Range Status   SARS Coronavirus 2 NEGATIVE NEGATIVE Final    Comment: (NOTE) If result is NEGATIVE SARS-CoV-2 target nucleic acids are NOT DETECTED. The SARS-CoV-2 RNA is generally detectable in upper and lower  respiratory specimens during the acute phase of infection. The lowest  concentration of SARS-CoV-2 viral copies this assay can detect is 250  copies / mL. A negative result does not preclude SARS-CoV-2 infection  and should not be used as the sole basis for treatment or other  patient management decisions.  A negative result may occur with  improper specimen collection / handling, submission of specimen other  than nasopharyngeal swab, presence of viral mutation(s) within the  areas targeted by this assay, and inadequate number of viral copies  (<250 copies / mL). A negative result must be combined with clinical  observations, patient history, and epidemiological information. If result  is POSITIVE SARS-CoV-2 target nucleic acids are DETECTED. The SARS-CoV-2 RNA is generally detectable in upper and lower  respiratory specimens dur ing the acute phase of infection.  Positive  results are indicative of active infection with SARS-CoV-2.  Clinical  correlation with patient history and other diagnostic information is  necessary to determine patient infection status.  Positive results do  not rule out bacterial infection or co-infection with other viruses. If result is PRESUMPTIVE POSTIVE SARS-CoV-2 nucleic acids MAY BE PRESENT.   A presumptive positive result was obtained on the submitted specimen  and confirmed on repeat testing.  While 2019 novel coronavirus  (SARS-CoV-2) nucleic acids may be present in the submitted sample  additional confirmatory testing may be necessary for epidemiological  and / or clinical management purposes  to differentiate between  SARS-CoV-2 and other Sarbecovirus currently known to infect humans.  If clinically indicated additional testing with an alternate test  methodology [phone]) is advised. The SARS-CoV-2 RNA is generally  detectable in upper and lower respiratory sp ecimens during the acute  phase of infection. The expected result is Negative. Fact Sheet for Patients:  StrictlyIdeas.no Fact Sheet for Healthcare Providers: BankingDealers.co.za This test is not yet approved or cleared by the [place] FDA and has been authorized for detection and/or diagnosis of SARS-CoV-2 by FDA under an Emergency Use Authorization (EUA).  This EUA will remain in effect (meaning this test can be used) for the duration of the COVID-19 declaration under Section 564(b)(1) of the Act, 21 U.S.C. section 360bbb-3(b)(1), unless the authorization is terminated or revoked sooner. Performed at [facility], [address] [place], [place] [address]   Culture, blood (Routine X 2) w Reflex to  ID Panel     Status: Abnormal   Collection Time: 10/08/18  8:54 AM  Result Value Ref Range Status   Specimen Description   Final    LEFT ANTECUBITAL Performed at [facility] [address] [place], [place] [address]    Special Requests   Final    BOTTLES DRAWN AEROBIC AND ANAEROBIC Blood Culture adequate volume Performed at [facility] [address], [place], [place] [address]    Culture  Setup Time   Final    GRAM POSITIVE COCCI IN BOTH AEROBIC AND ANAEROBIC BOTTLES CRITICAL RESULT CALLED TO, READ BACK BY AND VERIFIED WITH: M. RENZ PHARMD, AT 0706 10/09/18 BY Rush Landmark Performed at [facility], [place] [address], [place], [place] [address]    Culture METHICILLIN RESISTANT STAPHYLOCOCCUS AUREUS (A)  Final   Report Status 10/13/2018 FINAL  Final   Organism ID, Bacteria METHICILLIN RESISTANT STAPHYLOCOCCUS AUREUS  Final      Susceptibility   Methicillin resistant staphylococcus aureus - MIC*    CIPROFLOXACIN >=8 RESISTANT Resistant     ERYTHROMYCIN >=8 RESISTANT Resistant     GENTAMICIN <=0.5 SENSITIVE Sensitive     OXACILLIN RESISTANT Resistant     TETRACYCLINE <=1 SENSITIVE Sensitive     VANCOMYCIN <=0.5 SENSITIVE Sensitive     TRIMETH/SULFA <=10 SENSITIVE Sensitive     CLINDAMYCIN <=0.25 SENSITIVE Sensitive     RIFAMPIN <=0.5 SENSITIVE Sensitive     Inducible Clindamycin NEGATIVE Sensitive     * METHICILLIN RESISTANT STAPHYLOCOCCUS AUREUS  Blood Culture ID Panel (Reflexed)     Status: Abnormal   Collection Time: 10/08/18  8:54 AM  Result Value Ref Range Status   Enterococcus species NOT DETECTED NOT DETECTED Final   Listeria monocytogenes NOT DETECTED NOT DETECTED Final   Staphylococcus species DETECTED (A) NOT DETECTED Final    Comment: CRITICAL RESULT CALLED TO, READ BACK BY AND VERIFIED WITH: M. RENZ PHARMD, AT 0706 10/09/18 BY D. VANHOOK    Staphylococcus aureus (BCID) DETECTED (A) NOT DETECTED Final    Comment:  Methicillin (oxacillin)-resistant Staphylococcus aureus (MRSA). MRSA is predictably resistant to beta-lactam antibiotics (except ceftaroline). Preferred therapy is vancomycin unless clinically contraindicated. Patient requires contact precautions if  hospitalized. CRITICAL RESULT CALLED TO, READ BACK BY AND VERIFIED WITH: M. RENZ PHARMD, AT 6389 10/09/18 BY D. VANHOOK    Methicillin resistance DETECTED (A) NOT DETECTED Final    Comment: CRITICAL RESULT CALLED TO, READ BACK BY AND VERIFIED WITH: M. RENZ PHARMD, AT 0706 10/09/18 BY D. VANHOOK    Streptococcus species NOT DETECTED NOT DETECTED Final   Streptococcus agalactiae NOT DETECTED NOT DETECTED Final   Streptococcus pneumoniae NOT DETECTED NOT DETECTED Final   Streptococcus pyogenes NOT DETECTED NOT DETECTED Final   Acinetobacter baumannii NOT DETECTED NOT DETECTED Final   Enterobacteriaceae species NOT DETECTED NOT DETECTED Final   Enterobacter cloacae complex NOT DETECTED NOT DETECTED Final   Escherichia coli NOT DETECTED NOT DETECTED Final   Klebsiella oxytoca NOT DETECTED NOT DETECTED Final   Klebsiella pneumoniae NOT DETECTED NOT DETECTED Final   Proteus species NOT DETECTED NOT DETECTED Final   Serratia marcescens NOT DETECTED NOT DETECTED Final   Haemophilus influenzae NOT DETECTED NOT DETECTED Final   Neisseria meningitidis NOT DETECTED NOT DETECTED Final   Pseudomonas aeruginosa NOT DETECTED NOT DETECTED Final   Candida albicans NOT DETECTED NOT DETECTED Final  Candida glabrata NOT DETECTED NOT DETECTED Final   Candida krusei NOT DETECTED NOT DETECTED Final   Candida parapsilosis NOT DETECTED NOT DETECTED Final   Candida tropicalis NOT DETECTED NOT DETECTED Final    Comment: Performed at [facility], [place] [address] [place], [place] [address]  Culture, blood (Routine X 2) w Reflex to ID Panel     Status: Abnormal   Collection Time: 10/08/18  8:59 AM  Result Value Ref Range Status   Specimen Description   Final     RIGHT ANTECUBITAL Performed at [facility] [address], [place], [place] [address]    Special Requests   Final    BOTTLES DRAWN AEROBIC AND ANAEROBIC Blood Culture adequate volume Performed at [facility] [address] [place] [address] [place], [place] [address]    Culture  Setup Time   Final    GRAM POSITIVE COCCI IN CLUSTERS ANAEROBIC BOTTLE ONLY CRITICAL VALUE NOTED.  VALUE IS CONSISTENT WITH PREVIOUSLY REPORTED AND CALLED VALUE.    Culture (A)  Final    STAPHYLOCOCCUS AUREUS SUSCEPTIBILITIES PERFORMED ON PREVIOUS CULTURE WITHIN THE LAST 5 DAYS. Performed at [facility], [place] [address] [place], [place] [address]    Report Status 10/11/2018 FINAL  Final  Aerobic/Anaerobic Culture (surgical/deep wound)     Status: None   Collection Time: 10/08/18 12:33 PM  Result Value Ref Range Status   Specimen Description   Final    ABSCESS SPINE Performed at [facility] [address] [place], [place] [address]    Special Requests   Final    NONE Performed at [facility], [place] [address] [place], [place] [address]    Gram Stain   Final    ABUNDANT WBC PRESENT,BOTH PMN AND MONONUCLEAR RARE GRAM POSITIVE COCCI    Culture   Final    FEW METHICILLIN RESISTANT STAPHYLOCOCCUS AUREUS NO ANAEROBES ISOLATED Performed at [facility], [place] [address] [place], [place] [address]    Report Status 10/13/2018 FINAL  Final   Organism ID, Bacteria METHICILLIN RESISTANT STAPHYLOCOCCUS AUREUS  Final      Susceptibility   Methicillin resistant staphylococcus aureus - MIC*    CIPROFLOXACIN >=8 RESISTANT Resistant     ERYTHROMYCIN >=8 RESISTANT Resistant     GENTAMICIN <=0.5 SENSITIVE Sensitive     OXACILLIN >=4 RESISTANT Resistant     TETRACYCLINE <=1 SENSITIVE Sensitive     VANCOMYCIN 1 SENSITIVE Sensitive     TRIMETH/SULFA <=10 SENSITIVE Sensitive     CLINDAMYCIN <=0.25 SENSITIVE Sensitive     RIFAMPIN <=0.5  SENSITIVE Sensitive     Inducible Clindamycin NEGATIVE Sensitive     * FEW METHICILLIN RESISTANT STAPHYLOCOCCUS AUREUS  Culture, blood (Routine X 2) w Reflex to ID Panel     Status: None   Collection Time: 10/09/18 10:35 AM  Result Value Ref Range Status   Specimen Description   Final    BLOOD LEFT ARM Performed at [facility] [address], [place], [place] [address]    Special Requests   Final    BAA BCAV Performed at [place] [address] [place], [place] [address]    Culture   Final    NO GROWTH 5 DAYS Performed at [facility], [place] [address], [place], [place] [address]    Report Status 10/14/2018 FINAL  Final  Culture, blood (Routine X 2) w Reflex to ID Panel  Status: None   Collection Time: 10/09/18 10:41 AM  Result Value Ref Range Status   Specimen Description   Final    BLOOD RIGHT HAND Performed at [facility] [address] [place], [place] [address]    Special Requests   Final    BAA BCAV Performed at [facility] [address] [place], [place] [address]    Culture   Final    NO GROWTH 5 DAYS Performed at [facility], [place] [address], [place], [place] [address]    Report Status 10/14/2018 FINAL  Final     Labs: BNP (last 3 results) No results for input(s): BNP in the last 8760 hours. Basic Metabolic Panel: Recent Labs  Lab 10/09/18 0419 10/10/18 0451  10/12/18 0428 10/13/18 0355 10/14/18 0412 10/14/18 1105 10/15/18 0405  NA 132* 135  --   --   --   --  134*  --   K 4.1 4.6  --   --   --   --  4.6  --   CL 98 100  --   --   --   --  98  --   CO2 23 25  --   --   --   --  29  --   GLUCOSE 115* 111*  --   --   --   --  103*  --   BUN 18 18  --   --   --   --  14  --   CREATININE 0.88 1.01   < > 0.82 0.72 0.80 0.79 0.86  CALCIUM 9.1 9.2  --   --   --   --  9.5  --   MG  --  2.3  --   --   --   --   --   --    < > = values in this interval not  displayed.   Liver Function Tests: Recent Labs  Lab 10/14/18 1105  AST 18  ALT 18  ALKPHOS 87  BILITOT 0.5  PROT 7.8  ALBUMIN 3.3*   No results for input(s): LIPASE, AMYLASE in the last 168 hours. No results for input(s): AMMONIA in the last 168 hours. CBC: Recent Labs  Lab 10/09/18 0419 10/10/18 0451 10/14/18 1105  WBC 12.5* 9.0 9.3  NEUTROABS  --  5.8 6.8  HGB 13.3 12.9* 12.6*  HCT 41.3 39.6 39.6  MCV 96.5 96.1 94.5  PLT 282 260 329   Cardiac Enzymes: No results for input(s): CKTOTAL, CKMB, CKMBINDEX, TROPONINI in the last 168 hours. BNP: Invalid input(s): POCBNP CBG: No results for input(s): GLUCAP in the last 168 hours. D-Dimer No results for input(s): DDIMER in the last 72 hours. Hgb A1c No results for input(s): HGBA1C in the last 72 hours. Lipid Profile No results for input(s): CHOL, HDL, LDLCALC, TRIG, CHOLHDL, LDLDIRECT in the last 72 hours. Thyroid function studies No results for input(s): TSH, T4TOTAL, T3FREE, THYROIDAB in the last 72 hours.  Invalid input(s): FREET3 Anemia work up No results for input(s): VITAMINB12, FOLATE, FERRITIN, TIBC, IRON, RETICCTPCT in the last 72 hours. Urinalysis    Component Value Date/Time   COLORURINE COLORLESS (A) 10/28/2017 1125   APPEARANCEUR CLEAR 10/28/2017 1125   LABSPEC 1.001 (L) 10/28/2017 1125   PHURINE 6.0 10/28/2017 1125   GLUCOSEU NEGATIVE 10/28/2017 1125   HGBUR SMALL (A) 10/28/2017 1125   BILIRUBINUR NEGATIVE 10/28/2017 1125   KETONESUR NEGATIVE 10/28/2017 1125   PROTEINUR NEGATIVE  10/28/2017 1125   UROBILINOGEN 0.2 08/26/2012 1453   NITRITE NEGATIVE 10/28/2017 1125   LEUKOCYTESUR NEGATIVE 10/28/2017 1125   Sepsis Labs Invalid input(s): PROCALCITONIN,  WBC,  LACTICIDVEN Microbiology Recent Results (from the past 240 hour(s))  SARS Coronavirus 2 (CEPHEID - Performed in [facility] hospital lab), Hosp Order     Status: None   Collection Time: 10/07/18 10:28 PM  Result Value Ref Range Status    SARS Coronavirus 2 NEGATIVE NEGATIVE Final    Comment: (NOTE) If result is NEGATIVE SARS-CoV-2 target nucleic acids are NOT DETECTED. The SARS-CoV-2 RNA is generally detectable in upper and lower  respiratory specimens during the acute phase of infection. The lowest  concentration of SARS-CoV-2 viral copies this assay can detect is 250  copies / mL. A negative result does not preclude SARS-CoV-2 infection  and should not be used as the sole basis for treatment or other  patient management decisions.  A negative result may occur with  improper specimen collection / handling, submission of specimen other  than nasopharyngeal swab, presence of viral mutation(s) within the  areas targeted by this assay, and inadequate number of viral copies  (<250 copies / mL). A negative result must be combined with clinical  observations, patient history, and epidemiological information. If result is POSITIVE SARS-CoV-2 target nucleic acids are DETECTED. The SARS-CoV-2 RNA is generally detectable in upper and lower  respiratory specimens dur ing the acute phase of infection.  Positive  results are indicative of active infection with SARS-CoV-2.  Clinical  correlation with patient history and other diagnostic information is  necessary to determine patient infection status.  Positive results do  not rule out bacterial infection or co-infection with other viruses. If result is PRESUMPTIVE POSTIVE SARS-CoV-2 nucleic acids MAY BE PRESENT.   A presumptive positive result was obtained on the submitted specimen  and confirmed on repeat testing.  While 2019 novel coronavirus  (SARS-CoV-2) nucleic acids may be present in the submitted sample  additional confirmatory testing may be necessary for epidemiological  and / or clinical management purposes  to differentiate between  SARS-CoV-2 and other Sarbecovirus currently known to infect humans.  If clinically indicated additional testing with an alternate test   methodology [phone]) is advised. The SARS-CoV-2 RNA is generally  detectable in upper and lower respiratory sp ecimens during the acute  phase of infection. The expected result is Negative. Fact Sheet for Patients:  StrictlyIdeas.no Fact Sheet for Healthcare Providers: BankingDealers.co.za This test is not yet approved or cleared by the [place] FDA and has been authorized for detection and/or diagnosis of SARS-CoV-2 by FDA under an Emergency Use Authorization (EUA).  This EUA will remain in effect (meaning this test can be used) for the duration of the COVID-19 declaration under Section 564(b)(1) of the Act, 21 U.S.C. section 360bbb-3(b)(1), unless the authorization is terminated or revoked sooner. Performed at [facility], [place] [address], [place], [place] [address]   Culture, blood (Routine X 2) w Reflex to ID Panel     Status: Abnormal   Collection Time: 10/08/18  8:54 AM  Result Value Ref Range Status   Specimen Description   Final    LEFT ANTECUBITAL Performed at [facility] [address] [place], [place] [address]    Special Requests   Final    BOTTLES DRAWN AEROBIC AND ANAEROBIC Blood Culture adequate volume Performed at [facility] [address], [place], [place] [address]    Culture  Setup Time   Final    GRAM POSITIVE COCCI IN BOTH AEROBIC AND ANAEROBIC BOTTLES CRITICAL RESULT CALLED TO, READ BACK BY AND VERIFIED WITH: M. RENZ PHARMD, AT 0706 10/09/18 BY Rush Landmark Performed at [facility], [place] [address], [place], [place] [address]    Culture METHICILLIN RESISTANT STAPHYLOCOCCUS AUREUS (A)  Final   Report Status 10/13/2018 FINAL  Final   Organism ID, Bacteria METHICILLIN RESISTANT STAPHYLOCOCCUS AUREUS  Final      Susceptibility   Methicillin resistant staphylococcus aureus - MIC*    CIPROFLOXACIN >=8 RESISTANT Resistant     ERYTHROMYCIN >=8  RESISTANT Resistant     GENTAMICIN <=0.5 SENSITIVE Sensitive     OXACILLIN RESISTANT Resistant     TETRACYCLINE <=1 SENSITIVE Sensitive     VANCOMYCIN <=0.5 SENSITIVE Sensitive     TRIMETH/SULFA <=10 SENSITIVE Sensitive     CLINDAMYCIN <=0.25 SENSITIVE Sensitive     RIFAMPIN <=0.5 SENSITIVE Sensitive     Inducible Clindamycin NEGATIVE Sensitive     * METHICILLIN RESISTANT STAPHYLOCOCCUS AUREUS  Blood Culture ID Panel (Reflexed)     Status: Abnormal   Collection Time: 10/08/18  8:54 AM  Result Value Ref Range Status   Enterococcus species NOT DETECTED NOT DETECTED Final   Listeria monocytogenes NOT DETECTED NOT DETECTED Final   Staphylococcus species DETECTED (A) NOT DETECTED Final    Comment: CRITICAL RESULT CALLED TO, READ BACK BY AND VERIFIED WITH: M. RENZ PHARMD, AT 0706 10/09/18 BY D. VANHOOK    Staphylococcus aureus (BCID) DETECTED (A) NOT DETECTED Final    Comment: Methicillin (oxacillin)-resistant Staphylococcus aureus (MRSA). MRSA is predictably resistant to beta-lactam antibiotics (except ceftaroline). Preferred therapy is vancomycin unless clinically contraindicated. Patient requires contact precautions if  hospitalized. CRITICAL RESULT CALLED TO, READ BACK BY AND VERIFIED WITH: M. RENZ PHARMD, AT 3785 10/09/18 BY D. VANHOOK    Methicillin resistance DETECTED (A) NOT DETECTED Final    Comment: CRITICAL RESULT CALLED TO, READ BACK BY AND VERIFIED WITH: M. RENZ PHARMD, AT 0706 10/09/18 BY D. VANHOOK    Streptococcus species NOT DETECTED NOT DETECTED Final   Streptococcus agalactiae NOT DETECTED NOT DETECTED Final   Streptococcus pneumoniae NOT DETECTED NOT DETECTED Final   Streptococcus pyogenes NOT DETECTED NOT DETECTED Final   Acinetobacter baumannii NOT DETECTED NOT DETECTED Final   Enterobacteriaceae species NOT DETECTED NOT DETECTED Final   Enterobacter cloacae complex NOT DETECTED NOT DETECTED Final   Escherichia coli NOT DETECTED NOT DETECTED Final   Klebsiella  oxytoca NOT DETECTED NOT DETECTED Final   Klebsiella pneumoniae NOT DETECTED NOT DETECTED Final   Proteus species NOT DETECTED NOT DETECTED Final   Serratia marcescens NOT DETECTED NOT DETECTED Final   Haemophilus influenzae NOT DETECTED NOT DETECTED Final   Neisseria meningitidis NOT DETECTED NOT DETECTED Final   Pseudomonas aeruginosa NOT DETECTED NOT DETECTED Final   Candida albicans NOT DETECTED NOT DETECTED Final   Candida glabrata NOT DETECTED NOT DETECTED Final   Candida krusei NOT DETECTED NOT DETECTED Final   Candida parapsilosis NOT DETECTED NOT DETECTED Final   Candida tropicalis NOT DETECTED NOT DETECTED Final    Comment: Performed at [facility], [place] [address], [place], [place] [address]  Culture, blood (Routine X 2) w Reflex to ID Panel     Status: Abnormal   Collection Time: 10/08/18  8:59 AM  Result Value Ref Range Status   Specimen Description   Final    RIGHT ANTECUBITAL Performed at [facility], [place] [address], [place], [place] [address]    Special Requests   Final    BOTTLES DRAWN AEROBIC AND ANAEROBIC Blood Culture adequate volume Performed at [facility] [address], [place], [place]  [address]    Culture  Setup Time   Final    GRAM POSITIVE COCCI IN CLUSTERS ANAEROBIC BOTTLE ONLY CRITICAL VALUE NOTED.  VALUE IS CONSISTENT WITH PREVIOUSLY REPORTED AND CALLED VALUE.    Culture (A)  Final    STAPHYLOCOCCUS AUREUS SUSCEPTIBILITIES PERFORMED ON PREVIOUS CULTURE WITHIN THE LAST 5 DAYS. Performed at [facility], [place] [address] [place], [place] [address]    Report Status 10/11/2018 FINAL  Final  Aerobic/Anaerobic Culture (surgical/deep wound)     Status: None   Collection Time: 10/08/18 12:33 PM  Result Value Ref Range Status   Specimen Description   Final    ABSCESS SPINE Performed at [facility] [address] [place], [place] [address]    Special Requests   Final     NONE Performed at [facility], [place] [address] [place], [place] [address]    Gram Stain   Final    ABUNDANT WBC PRESENT,BOTH PMN AND MONONUCLEAR RARE GRAM POSITIVE COCCI    Culture   Final    FEW METHICILLIN RESISTANT STAPHYLOCOCCUS AUREUS NO ANAEROBES ISOLATED Performed at [facility], [place] [address], [place], [place] [address]    Report Status 10/13/2018 FINAL  Final   Organism ID, Bacteria METHICILLIN RESISTANT STAPHYLOCOCCUS AUREUS  Final      Susceptibility   Methicillin resistant staphylococcus aureus - MIC*    CIPROFLOXACIN >=8 RESISTANT Resistant     ERYTHROMYCIN >=8 RESISTANT Resistant     GENTAMICIN <=0.5 SENSITIVE Sensitive     OXACILLIN >=4 RESISTANT Resistant     TETRACYCLINE <=1 SENSITIVE Sensitive     VANCOMYCIN 1 SENSITIVE Sensitive     TRIMETH/SULFA <=10 SENSITIVE Sensitive     CLINDAMYCIN <=0.25 SENSITIVE Sensitive     RIFAMPIN <=0.5 SENSITIVE Sensitive     Inducible Clindamycin NEGATIVE Sensitive     * FEW METHICILLIN RESISTANT STAPHYLOCOCCUS AUREUS  Culture, blood (Routine X 2) w Reflex to ID Panel     Status: None   Collection Time: 10/09/18 10:35 AM  Result Value Ref Range Status   Specimen Description   Final    BLOOD LEFT ARM Performed at [facility] [address] [place], [place] [address]    Special Requests   Final    BAA BCAV Performed at [facility] [address] [place], [place] [address]    Culture   Final    NO GROWTH 5 DAYS Performed at [facility], [place] [address], [place], [place] [address]    Report Status 10/14/2018 FINAL  Final  Culture, blood (Routine X 2) w Reflex to ID Panel     Status: None   Collection Time: 10/09/18 10:41 AM  Result Value Ref Range Status   Specimen Description   Final    BLOOD RIGHT HAND Performed at [facility] [address], [place],  [address]    Special Requests   Final    BAA BCAV Performed at  [facility] [address] [place],  [address]    Culture   Final    NO GROWTH 5 DAYS Performed at [facility], [place] [address] [place],  [address]  Report Status 10/14/2018 FINAL  Final    Please note: You were cared for by a hospitalist during your hospital stay. Once you are discharged, your primary care physician will handle any further medical issues. Please note that NO REFILLS for any discharge medications will be authorized once you are discharged, as it is imperative that you return to your primary care physician (or establish a relationship with a primary care physician if you do not have one) for your post hospital discharge needs so that they can reassess your need for medications and monitor your lab values.    Time coordinating discharge: 40 minutes  SIGNED:   Shelly Coss, MD  [facility] 10/15/2018, 9:36 AM Pager [phone]  If 7PM-7AM, please contact night-coverage [email or web address] Password [email or web address]

## 2018-10-15 NOTE — Plan of Care (Signed)

## 2018-10-16 ENCOUNTER — Other Ambulatory Visit: Payer: Self-pay | Admitting: Adult Health

## 2018-10-16 ENCOUNTER — Telehealth: Payer: Self-pay | Admitting: Infectious Disease

## 2018-10-16 LAB — DRUG PROFILE, UR, 9 DRUGS (LABCORP)
Amphetamines, Urine: NEGATIVE ng/mL
Barbiturate, Ur: NEGATIVE ng/mL
Benzodiazepine Quant, Ur: NEGATIVE ng/mL
Cannabinoid Quant, Ur: NEGATIVE ng/mL
Cocaine (Metab.): POSITIVE ng/mL — AB
Methadone Screen, Urine: NEGATIVE ng/mL
Opiate Quant, Ur: POSITIVE ng/mL — AB
Phencyclidine, Ur: NEGATIVE ng/mL
Propoxyphene, Urine: NEGATIVE ng/mL

## 2018-10-16 NOTE — Telephone Encounter (Signed)
Called the patient and advised of appointment time and address of clinic. He has no conflicts and will be at visit.

## 2018-10-16 NOTE — Telephone Encounter (Signed)
I wanted to let pt know of appt I made for him with me for next Thursday can someone call him for me. I coudnt schedule him when he was in the hospital

## 2018-10-17 NOTE — Telephone Encounter (Signed)
Perfect thanks much Darnelle Maffucci

## 2018-10-18 ENCOUNTER — Telehealth: Payer: Self-pay | Admitting: *Deleted

## 2018-10-18 NOTE — Telephone Encounter (Signed)
Patient was advised to leave off his Amitriptyline wile taking the Zyvox, due to a drug interaction. The patient was also advised to pay cash price of $10.00, for his Amitriptyline. [facility] also advised.

## 2018-10-21 LAB — HCV RNA QUANT RFLX ULTRA OR GENOTYP
HCV RNA Qnt(log copy/mL): 6.253 log10 IU/mL
HepC Qn: 1790000 IU/mL

## 2018-10-21 LAB — HEPATITIS C GENOTYPE

## 2018-10-22 ENCOUNTER — Telehealth: Payer: Self-pay

## 2018-10-22 NOTE — Telephone Encounter (Signed)
Patient calling to see if we are able to draw labs in our office . He has an appointment scheduled on Thursday and his primary care office may need labs from Wednesday's visit.   Patient was advised to bring the list of labs to the visit so we can add if labs are completed.    Laverle Patter, RN

## 2018-10-22 NOTE — Progress Notes (Addendum)
Hospital follow up  Assessment and Plan: Hospital visit follow up for:   Subacute osteomyelitis, other site [facility]) Follow up ID tomorrow as scheduled; checking labs there, declines today Continue zyvox as recommended by ID  Discitis of thoracic region Follow up ortho/ID as scheduled  MRSA bacteremia VS stable; appears at baseline today; check labs tomorrow at follow up with ID  IVDU (intravenous drug user) Denies use since discharge; not candidate for controlled substances Per his preference, he will schedule psych eval for management and will refer to pain management  Chronic pain due to trauma Chronic pain due to trauma complicated by bipolar disorder, anxiety and drug use Discussed new guidelines and why we cannot prescribe opioids or benzo Discussed with cocaine +  - likely cannot get prescribed;  Discussed alternative pain management; he reports gabapentin, amitryptiline are helpful - currently off due to concerns with interactions with zyvox - resume after completing course in 4-5 days He wishes to proceed with pain management referral to discuss alternative therapies, understands likely not a candidate for opioids -     Ambulatory referral to Pain Clinic  Cocaine use Denies use since discharge;   Marijuana smoker Advised to reduce/stop; he is trying CBD oil alternately with some benefit He will be scheduling with psych  Defer labs today per patient request; he will get checked tomorrow by ID; they will forward [place] results; req obtain routine labs - CBC, CMP/GFR, magnesium, lipid panel, A1C, vitamin D levels for our purposes, forward and will do routine 75mOV which he is due for in 2 weeks  All medications were reviewed with patient and family and fully reconciled. All questions answered fully, and patient and family members were encouraged to call the office with any further questions or concerns. Discussed goal to avoid readmission related to this diagnosis.   Medications  Discontinued During This Encounter  Medication Reason  . senna-docusate (SENOKOT-S) 8.6-50 MG tablet   . HYDROcodone-acetaminophen (NORCO) 5-325 MG tablet Completed Course  . ALPRAZolam (XANAX) 1 MG tablet Discontinued by provider    Over 40 minutes of exam, counseling, chart review, and complex, high/moderate level critical decision making was performed this visit.   Future Appointments  Date Time Provider [place]  10/25/2018  9:45 AM Tommy Medal, Lavell Islam, MD [facility]  11/07/2018 12:00 PM Liane Comber, NP [facility] None  01/09/2019 11:15 AM Liane Comber, NP [facility] None  10/30/2019  3:00 PM Unk Pinto, MD [facility] None     HPI 58 y.o.male presents for follow up for transition from recent hospitalization or SNIF stay. Admit date to the hospital was 10/07/18, patient was discharged from the hospital on 10/15/18 and our clinical staff contacted the office the day after discharge to set up a follow up appointment. The discharge summary, medications, and diagnostic test results were reviewed before meeting with the patient. The patient was admitted for:   Discharge Diagnoses:  Principal Problem:   Discitis Active Problems:   Essential hypertension   Hyperlipidemia, mixed   Hepatitis C, chronic (HCC)   Tobacco use disorder   Prediabetes   MRSA bacteremia   Back pain   Subacute osteomyelitis, other site [facility])   IVDU (intravenous drug user)   Brief/Interim Summary:  Barney Russomanno Boothis a 58 y.o.malewith medical history significant ofhypertension, hyperlipidemia, prediabetes, polytrauma years ago, chronic pain syndrome who presented to the emergency department with complaints of worsening back pain,left-sided rib painandmild grade fever. MRI, CT imagings done in the emergency department showed discitis/osteomyelitis at T10-T12  with right paraspinal phlegmon. Blood culture showed MRSA. Anaerobic/aerobic culture also showed MRSA. ID was consulted and  followed. Started  on vancomycin. Difficult discharge planning due to his history of IV drug abuse. Hospital course remarkable for persistent constipation, abdominal discomfort. Finally he had bowel movements. ID decided to discharge him on Zyvox with plan to follow-up with ID as an outpatient.  Following problems were addressed during his hospitalization:  Discitis/osteomyelitis:Presented with back pain, fever. MRI thoracic spine showed severe marrow edema and enhancement in the T10 and T11 vertebral bodies concerning for discitis-osteomyelitis at T10-11.Also showed severe anterior paravertebral soft tissue edema and enhancement at T11-12 without a drainable component most consistent with a phlegmon. Underwent IR for drainage of the discitisarea. Aerobic/anaerobic culture showed MRSA. Back pain has improved. Discharged afebrile .Was on vancomycin, then Zyvox. He has continued with this without SE or concerns. Has follow up with ID tomorrow.   MRSA bacteremia: On vancomycin. ID following. Patient reported that he had prior history of IVDU,last use was 3 years ago.  Repeat blood cultures remained negative. ID decided to change antibiotic toZyvox.  He will follow-up with ID as an outpatient - has follow up scheduled tomorrow.   Constipation: CT abdomen/pelvis done which showed large amount of stool in the proximal to mid colon with decompression of the distal colon and rectum. No obstructing lesion identified.GI consulted.Given SMOG enemas . Finally had bowel movement. Discharge plan to minimize narcotics. He reports taking miralax daily and has BM daily since discharge.   Hypertension:Currently blood pressure stable. Resume home medications.  His blood pressure has been controlled at home, today their BP is BP: 122/70 He denies chest pain, shortness of breath, dizziness.  Hepatitis C:Follows as an outpatient with liver clinic. Not treated yet.We recommend continued  follow-up as an outpatient. Follow up HCV RNA. Patient reports he has declined treatment, he will follow up with Dr. Drucilla Schmidt with ID for this.   Smoker: Smokes half pack a day for last several years. Counseled for smoking cessation. Continue nicotine patch.  Chronic alcohol abuse: Drinks 2-3 bottles of 12 ounce beer every day. Counseled for cessation.   Chronic pain syndrome/chronic back pain/anxiety:Follows with orthopedics as an outpatient. Continue supportive care.Has history of muscle spasms. He requests referral to pain management today. He is off of amitriptyline due to potential interactions with zyvox (has 5 more days left). Continues with gabapentin (is beneficial - taking 300 mg TID), on soma 350 mg daily. Off of norco due to + cocaine test in hospital.   Persistent left subcostal pain:Patient complains of persistent pain on the left subcostal region. He has history of polytrauma in the past. This pain is chronic. Abd Xray showed: Multiple old left-sided rib fractures , status post fixation. Many of the wires are fractured.  Follow up ortho as an outpatient. Will refer to pain management per patient preference.   Denies fever/chills; back pain/rib/flank pain consistent with baseline He reports he is struggling with sleep, pain reportedly 10/10 all day if he moves wrong, very difficult to get comfortable, this is ongoing since trauma many years ago and has been on disability 10+ years He admits he is smoking marijuana to help manage; denies cocaine use since discharge, discussed he was + when checked in hospital  Gabapentin and amitryptyline does help significantly; he takes ibuprofen 200 mg x 12 tabs daily (helps some); tylenol 2 tabs daily (not helpful at all); he is off of hydrocodone due to + cocaine; he requests referral to pain  management to discuss alternate therapies  He is prescribed celexa for anxiety; currently taking 20 mg daily; this is beneficial; he is  bipolar He was prescribed xanax but this has not been refilled due to opioid and + cocaine; he plans to schedule follow up with psych to discuss.   Reports has had no problems with constipation since discharge; takes miralax PRN.   Component     Latest Ref Rng & Units 10/14/2018  WBC     4.0 - 10.5 K/uL 9.3  RBC     4.22 - 5.81 MIL/uL 4.19 (L)  Hemoglobin     13.0 - 17.0 g/dL 12.6 (L)  HCT     39.0 - 52.0 % 39.6  MCV     80.0 - 100.0 fL 94.5  MCH     26.0 - 34.0 pg 30.1  MCHC     30.0 - 36.0 g/dL 31.8  RDW     11.5 - 15.5 % 12.3  Platelets     150 - 400 K/uL 329  nRBC     0.0 - 0.2 % 0.0  Neutrophils     % 71  NEUT#     1.7 - 7.7 K/uL 6.8  Lymphocytes     % 15  Lymphocyte #     0.7 - 4.0 K/uL 1.4  Monocytes Relative     % 10  Monocyte #     0.1 - 1.0 K/uL 0.9  Eosinophil     % 2  Eosinophils Absolute     0.0 - 0.5 K/uL 0.2  Basophil     % 1  Basophils Absolute     0.0 - 0.1 K/uL 0.1  Immature Granulocytes     % 1  Abs Immature Granulocytes     0.00 - 0.07 K/uL 0.06  Sodium     135 - 145 mmol/L 134 (L)  Potassium     3.5 - 5.1 mmol/L 4.6  Chloride     98 - 111 mmol/L 98  CO2     22 - 32 mmol/L 29  Glucose     70 - 99 mg/dL 103 (H)  BUN     6 - 20 mg/dL 14  Creatinine     0.61 - 1.24 mg/dL 0.79  Calcium     8.9 - 10.3 mg/dL 9.5  Total Protein     6.5 - 8.1 g/dL 7.8  Albumin     3.5 - 5.0 g/dL 3.3 (L)  AST     15 - 41 U/L 18  ALT     0 - 44 U/L 18  Alkaline Phosphatase     38 - 126 U/L 87  Total Bilirubin     0.3 - 1.2 mg/dL 0.5  GFR, Est Non African American     >60 mL/min >60  GFR, Est African American     >60 mL/min >60  Anion gap     5 - 15 7     Home health is not involved.   Images while in the hospital: Mr Thoracic Spine W Wo Contrast  Result Date: 10/08/2018 CLINICAL DATA:  Pt has suspected osteomyelitis T10-T11. Pt stated he hurt is back six weeks ago working in garden. No previous back surgeries. EXAM: MRI THORACIC  WITHOUT AND WITH CONTRAST TECHNIQUE: Multiplanar and multiecho pulse sequences of the thoracic spine were obtained without and with intravenous contrast. CONTRAST:  10 mL Gadavist COMPARISON:  CT chest 10/08/2018 CT abdomen 09/11/2018 FINDINGS: MRI THORACIC SPINE FINDINGS  Alignment:  Physiologic. Vertebrae: Severe marrow edema in the T10 and T11 vertebral bodies with fluid signal within the T10-11 disc space and to lesser extent T11-12 disc space. Severe marrow edema and enhancement of the T10 and T11 vertebral bodies. Severe anterior paravertebral soft tissue edema and enhancement at T11-12 without a drainable component most consistent with a phlegmon. Mild epidural enhancement at T11-12 likely reflecting epidural inflammatory changes given the vertebral body abnormality without a drainable component to suggest an abscess. No other aggressive osseous lesion.  No acute fracture. Cord:  Normal signal and morphology. Paraspinal and other soft tissues: Severe paravertebral inflammatory changes at T10-11 without a drainable component. Small right pleural effusion. Disc levels: Disc spaces: Degenerative disc disease with disc height loss at C5-6 and C6-7 with broad-based disc osteophyte complex is on the sagittal images. T1-T2: No disc protrusion, foraminal stenosis or central canal stenosis. T2-T3: Mild broad-based disc bulge. No foraminal or central canal stenosis. T3-T4: Mild broad-based disc bulge. No foraminal or central canal stenosis. T4-T5: No disc protrusion, foraminal stenosis or central canal stenosis. T5-T6: No disc protrusion, foraminal stenosis or central canal stenosis. T6-T7: No disc protrusion, foraminal stenosis or central canal stenosis. T7-T8: No disc protrusion, foraminal stenosis or central canal stenosis. T8-T9: No disc protrusion, foraminal stenosis or central canal stenosis. T9-T10: Small shallow right paracentral disc protrusion. No foraminal or central canal stenosis. T10-T11: No disc  protrusion. Epidural inflammatory changes. Moderate bilateral facet arthropathy. Moderate right and mild left foraminal stenosis. T11-T12: No significant disc protrusion. Mild bilateral facet arthropathy. No significant foraminal stenosis. IMPRESSION: 1. Severe marrow edema and enhancement in the T10 and T11 vertebral bodies with fluid signal within the T10-11 disc space and to lesser extent T11-12 disc space most concerning for discitis-osteomyelitis at T10-11. Severe anterior paravertebral soft tissue edema and enhancement at T11-12 without a drainable component most consistent with a phlegmon. Mild epidural enhancement at T11-12 likely reflecting epidural inflammatory changes given the vertebral body abnormality without a drainable component to suggest an abscess. Electronically Signed   By: Kathreen Devoid   On: 10/08/2018 08:24   Ct Aspiration  Result Date: 10/08/2018 CLINICAL DATA:  Thoracic T10-T11 discitis/osteomyelitis with paraspinal phlegmon. EXAM: CT GUIDED ASPIRATION BIOPSY OF THORACIC PARASPINAL PHLEGMON ANESTHESIA/SEDATION: Intravenous Fentanyl 162mcg and Versed 4mg  were administered as conscious sedation during continuous monitoring of the patient's level of consciousness and physiological / cardiorespiratory status by the radiology RN, with a total moderate sedation time of 13 minutes. PROCEDURE: The procedure risks, benefits, and alternatives were explained to the patient. Questions regarding the procedure were encouraged and answered. The patient understands and consents to the procedure. Patient placed prone. Select axial scans through the lower thoracic spine obtained. The T10-11 level was localized and an appropriate skin entry site to the right paraspinal phlegmon was identified and marked. The operative field was prepped with chlorhexidinein a sterile fashion, and a sterile drape was applied covering the operative field. A sterile gown and sterile gloves were used for the procedure. Local  anesthesia was provided with 1% Lidocaine. Under CT fluoroscopic guidance, a 18 gauge trocar needle was advanced into the right paraspinal phlegmon. Aspiration returned less than 2 mL bloody fluid, submitted for Gram stain and culture. The guide needle was removed. Postprocedure scans show no hemorrhage or other apparent complication. The patient tolerated the procedure well. COMPLICATIONS: None immediate FINDINGS: The disc changes at T10-T11 consistent with discitis/osteomyelitis were again identified. The paraspinal phlegmon was targeted and CT-guided aspiration sample obtained as above.  Gram stain and culture pending. IMPRESSION: 1. Technically successful CT-guided aspiration of right paraspinal phlegmon T10-11. Electronically Signed   By: Lucrezia Europe M.D.   On: 10/08/2018 12:59   Ct T-spine No Charge  Result Date: 10/08/2018 CLINICAL DATA:  58 year old male with chest pain radiating to the back. EXAM: CT THORACIC SPINE WITH CONTRAST TECHNIQUE: Multiplanar CT images of the thoracic spine were reconstructed from contemporary CTA of the Chest. CONTRAST:  No additional. COMPARISON:  CTA chest, Abdomen, and Pelvis today are reported separately. Noncontrast CT Abdomen and Pelvis 09/11/2018. FINDINGS: Segmentation: Normal. Alignment: Overall preserved thoracic kyphosis. Mild levoconvex scoliosis. Vertebrae: Partial destruction of the anterior vertebral bodies at T10-T11 (series 20, image 37) associated with regional paraspinal soft tissue thickening and hyper enhancement suspicious for discitis osteomyelitis with inflammatory phlegmon. The ventral and right paraspinal soft tissues are primarily affected, relatively sparing the periaortic soft tissues. There is mild associated retrocrural lymphadenopathy with 8 millimeter short axis nodes. No discrete fluid collection. The posterior T10 and T11 vertebral bodies and posterior elements appear to remain intact. There is degenerative appearing right T10 neural foraminal  stenosis. Other thoracic vertebrae appear intact. The posterior right ribs appear intact. There are chronic postoperative changes to the left posterior ribs with cerclage wires. Paraspinal and other soft tissues: Abnormal at the right T10-T11 level. Other chest and abdominal visceral findings are reported separately today. Disc levels: Degenerative mild spinal stenosis suspected at T10-T11. IMPRESSION: 1. 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 Follow-up Thoracic Spine MRI without and with contrast may be valuable. 2. No other acute process identified in the thoracic spine. Superimposed degenerative spinal and right foraminal stenosis suspected at T10-T11. 3. CTA chest, Abdomen, and Pelvis today are reported separately. Electronically Signed   By: Genevie Ann M.D.   On: 10/08/2018 01:36   Ct Angio Chest/abd/pel For Dissection W And/or Wo Contrast  Result Date: 10/08/2018 CLINICAL DATA:  58 year old male with chest pain radiating to the back. EXAM: CT ANGIOGRAPHY CHEST, ABDOMEN AND PELVIS TECHNIQUE: Multidetector CT imaging through the chest, abdomen and pelvis was performed using the standard protocol during bolus administration of intravenous contrast. Multiplanar reconstructed images and MIPs were obtained and reviewed to evaluate the vascular anatomy. CONTRAST:  176mL OMNIPAQUE IOHEXOL 350 MG/ML SOLN COMPARISON:  Noncontrast CT Abdomen and Pelvis 09/11/2018. Cardiac CTA 11/14/2008. FINDINGS: CTA CHEST FINDINGS Cardiovascular: Chronic surgical clips along the left mediastinum situated between the left pulmonary artery bifurcation and descending aorta. No calcified coronary artery atherosclerosis is evident. No thoracic aortic aneurysm or dissection. Minimal thoracic aortic atherosclerosis. Normal patency of the central pulmonary arteries. Stable cardiac size since 2010.  No pericardial effusion. Mediastinum/Nodes: Negative.  No  lymphadenopathy. Lungs/Pleura: Major airways are patent. Left lower lobectomy suspected. There is chronic subpleural scarring and reticular opacity in both lungs. There is a trace layering right pleural effusion with new enhancing right costophrenic angle atelectasis. Musculoskeletal: Thoracic spine CT reformatted from these images is reported separately today (please see that report). Chronic left posterior rib resections. Review of the MIP images confirms the above findings. CTA ABDOMEN AND PELVIS FINDINGS VASCULAR Major arterial structures are patent. There is iliofemoral atherosclerosis. Review of the MIP images confirms the above findings. NON-VASCULAR Hepatobiliary: Surgically absent gallbladder.  Negative liver. Pancreas: Negative. Spleen: Negative. Adrenals/Urinary Tract: Normal adrenal glands. The kidneys enhance symmetrically and appear negative. Proximal ureters are decompressed. Mildly distended but otherwise unremarkable urinary bladder. Stomach/Bowel: Redundant large bowel with retained  stool and gas distended sigmoid. The cecum is on a lax mesentery in the anterior right upper abdomen. No large bowel inflammation. Normal appendix on series 5, image 153. Negative terminal ileum. No dilated small bowel. Negative stomach and duodenum. No free fluid, free air. Lymphatic: Mild retrocrural lymphadenopathy associated with the abnormal lower thoracic spine, see thoracic spine CT. Other lymph nodes are within normal limits. Reproductive: Negative. Other: No pelvic free fluid. Musculoskeletal: Lumbar segmentation appears normal and the lumbar spine appears intact. There is advanced lower lumbar disc, endplate, and facet degeneration. No acute osseous abnormality identified in the abdomen or pelvis. Review of the MIP images confirms the above findings. IMPRESSION: 1. Abnormal thoracic spine at the T10-T11 level, see dedicated thoracic spine CT reported separately. 2. Negative for aortic aneurysm or dissection.  3. No other acute or inflammatory process identified in the chest, abdomen, or pelvis. 4. Previous left lower lobectomy suspected.  Chronic lung disease. Electronically Signed   By: Genevie Ann M.D.   On: 10/08/2018 01:29      Current Outpatient Medications (Cardiovascular):  .  lisinopril-hydrochlorothiazide (PRINZIDE,ZESTORETIC) 20-25 MG tablet, TAKE ONE TABLET BY MOUTH ONCE DAILY (Patient taking differently: Take 0.5 tablets by mouth daily. ) .  rosuvastatin (CRESTOR) 40 MG tablet, Take 1/2 to 1 tablet daily or as directed for Cholesterol (Patient taking differently: Take 40 mg by mouth daily. )  Current Outpatient Medications (Respiratory):  .  albuterol (PROVENTIL HFA;VENTOLIN HFA) 108 (90 Base) MCG/ACT inhaler, INHALE 2 PUFFS BY MOUTH EVERY 4 HOURS AS NEEDED FOR WHEEZING AND FOR SHORTNESS OF BREATH (Patient not taking: No sig reported)  Current Outpatient Medications (Analgesics):  .  aspirin EC 325 MG tablet, Take 325 mg by mouth daily.  Marland Kitchen  ibuprofen (ADVIL,MOTRIN) 200 MG tablet, Take 800 mg by mouth every 4 (four) hours as needed for moderate pain. Takes 6 tablets every four hours  Current Outpatient Medications (Hematological):  Marland Kitchen  Cyanocobalamin (VITAMIN B-12) 1000 MCG SUBL, Place 1,000 mcg under the tongue every other day.   Current Outpatient Medications (Other):  .  carisoprodol (SOMA) 350 MG tablet, Take 350 mg by mouth every 6 (six) hours as needed for muscle spasms.  .  Cholecalciferol (VITAMIN D) 50 MCG (2000 UT) tablet, Take 8,000 Units by mouth daily.  .  citalopram (CELEXA) 10 MG tablet, Take 1 tablet (10 mg total) by mouth daily. (Patient taking differently: Take 20 mg by mouth daily. ) .  gabapentin (NEURONTIN) 300 MG capsule, Take 1 capsule (300 mg total) by mouth 3 (three) times daily as needed. For pain. Marland Kitchen  linezolid (ZYVOX) 600 MG tablet, Take 1 tablet (600 mg total) by mouth every 12 (twelve) hours. .  Menthol-Camphor (ICY HOT ADVANCED RELIEF) 16-11 % CREA, Apply 1  application topically 3 (three) times daily as needed (pain). .  polyethylene glycol (MIRALAX / GLYCOLAX) 17 g packet, Take 17 g by mouth daily as needed. Marland Kitchen  amitriptyline (ELAVIL) 150 MG tablet, Take 1 tablet at Bedtime for Sleep & Mood (Patient not taking: Reported on 10/24/2018)  Past Medical History:  Diagnosis Date  . Bipolar 1 disorder ([place])   . Hepatitis C   . Hypertension   . Kidney stones      Allergies  Allergen Reactions  . Codeine Itching  . Dilaudid [Hydromorphone Hcl] Itching    ROS: all negative except above.   Physical Exam: Filed Weights   10/24/18 1117  Weight: 231 lb 9.6 oz (105.1 kg)   BP 122/70  Pulse 95   Temp (!) 97.5 F (36.4 C)   Ht 5' 11.5" (1.816 m)   Wt 231 lb 9.6 oz (105.1 kg)   SpO2 96%   BMI 31.85 kg/m  General Appearance: Well nourished, in no apparent distress. Eyes: PERRLA,  conjunctiva no swelling or erythema ENT/Mouth: Ext aud canals clear, TMs without erythema, bulging. No erythema, swelling, or exudate on post pharynx.  Tonsils not swollen or erythematous. Hearing normal.  Neck: Supple, thyroid normal.  Respiratory: Respiratory effort normal, BS equal bilaterally without rales, rhonchi, wheezing or stridor.  Cardio: RRR with no MRGs. Brisk peripheral pulses without edema.  Abdomen: Soft, distended + BS.  He has tenderness to palpation through left upper/lateral abdomen (reports this is chronic), no guarding, rebound, hernias, palpable masses. Lymphatics: Non tender without lymphadenopathy.  Musculoskeletal: Symmetrical strength, slow position changes with signs of discomfort/bracing, antalgic gait.  Skin: Warm, dry without rashes, lesions, ecchymosis. No obvious fresh puncture marks to upper extremities Neuro: Cranial nerves intact. Normal muscle tone, no cerebellar symptoms. Sensation intact.  Psych: Awake and oriented X 3, normal affect, speech non-pressured though questionably mildly slurred, Insight and Judgment fair, no ideations,  pleasant,     Izora Ribas, NP 1:31 PM [facility]

## 2018-10-24 ENCOUNTER — Encounter: Payer: Self-pay | Admitting: Adult Health

## 2018-10-24 ENCOUNTER — Ambulatory Visit (INDEPENDENT_AMBULATORY_CARE_PROVIDER_SITE_OTHER): Payer: Medicare Other | Admitting: Adult Health

## 2018-10-24 ENCOUNTER — Telehealth: Payer: Self-pay | Admitting: Infectious Disease

## 2018-10-24 ENCOUNTER — Other Ambulatory Visit: Payer: Self-pay

## 2018-10-24 VITALS — BP 122/70 | HR 95 | Temp 97.5°F | Ht 71.5 in | Wt 231.6 lb

## 2018-10-24 DIAGNOSIS — F1290 Cannabis use, unspecified, uncomplicated: Secondary | ICD-10-CM | POA: Insufficient documentation

## 2018-10-24 DIAGNOSIS — F1990 Other psychoactive substance use, unspecified, uncomplicated: Secondary | ICD-10-CM | POA: Diagnosis not present

## 2018-10-24 DIAGNOSIS — R0789 Other chest pain: Secondary | ICD-10-CM

## 2018-10-24 DIAGNOSIS — M8628 Subacute osteomyelitis, other site: Secondary | ICD-10-CM | POA: Diagnosis not present

## 2018-10-24 DIAGNOSIS — R7881 Bacteremia: Secondary | ICD-10-CM | POA: Diagnosis not present

## 2018-10-24 DIAGNOSIS — M4644 Discitis, unspecified, thoracic region: Secondary | ICD-10-CM

## 2018-10-24 DIAGNOSIS — G8921 Chronic pain due to trauma: Secondary | ICD-10-CM

## 2018-10-24 DIAGNOSIS — Z87898 Personal history of other specified conditions: Secondary | ICD-10-CM | POA: Insufficient documentation

## 2018-10-24 DIAGNOSIS — F1491 Cocaine use, unspecified, in remission: Secondary | ICD-10-CM | POA: Insufficient documentation

## 2018-10-24 DIAGNOSIS — F1490 Cocaine use, unspecified, uncomplicated: Secondary | ICD-10-CM

## 2018-10-24 NOTE — Addendum Note (Signed)
Addended by: Izora Ribas on: 10/24/2018 01:31 PM   Modules accepted: Orders

## 2018-10-24 NOTE — Patient Instructions (Addendum)
Need CBC, CMP/GFR, magnesium, lipid panel, HgA1C, vitamin D levels checked tomorrow - have them fax [place] the results - will follow up in 2 weeks to discuss  Call to schedule appointment today with psych for management of bipolar and anxiety - discuss xanax with them. We cannot prescribe through our office.   Discuss if/when you can restart mood medications (amitryptiline) with infectious disease -      What You Need to Know About Chronic Back Pain Long-term (chronic) back pain is back pain that lasts for 12 weeks or longer. It often affects the lower back and can range from mild to severe. Many people have back pain at some point in their lives. It can feel different to each person. It may feel like a muscle ache or a sharp, stabbing pain. The pain often gets worse over time. It can be difficult to find the cause of chronic back pain. Treating chronic back pain often starts with rest and pain relief, followed by exercises (physical therapy) to strengthen the muscles that support your back. You may have to try different things to see what works best for you. If other treatments do not help, or if your pain is caused by a condition or an injury, you may need surgery. How can back pain affect me? Chronic back pain is uncomfortable and can make it hard to do your usual daily activities. Chronic back pain can:  Cause numbness and tingling.  Come and go.  Get worse when you are sitting, standing, walking, bending, or lifting.  Affect you while you are active, at rest, or both.  Eventually make it hard to move around.  Occur with fever, weight loss, or difficulty urinating. What are the benefits of treating back pain? Treating chronic back pain may:  Relieve pain.  Keep your pain from getting worse.  Make it easier for you to do your usual activities. What are some steps I can take to decrease my back pain?   Take over-the-counter or prescription medicines only as told by your health care  provider.  If directed, apply heat to the affected area. Use the heat source that your health care provider recommends, such as a moist heat pack or a heating pad. ? Place a towel between your skin and the heat source. ? Leave the heat on for 20-30 minutes. ? Remove the heat if your skin turns bright red. This is especially important if you are unable to feel pain, heat, or cold. You may have a greater risk of getting burned.  If directed, put ice on the affected area: ? Put ice in a plastic bag. ? Place a towel between your skin and the bag. ? Leave the ice on for 20 minutes, 2-3 times a day.  Get regular exercise as told by your health care provider to improve flexibility and strength.  Do not smoke.  Maintain a healthy weight.  When lifting objects: ? Keep your feet as far apart as your shoulders (shoulder-width apart) or farther apart. ? Tighten the muscles in your abdomen. ? Bend your knees and hips and keep your spine neutral. It is important to lift using the strength of your legs, not your back. Do not lock your knees straight out. ? Always ask for help to lift heavy or awkward objects. What can happen if my back pain goes untreated? Untreated back pain can:  Get worse over time.  Start to occur more often or at different times, such as when you are  resting.  Cause posture problems.  Make it hard to move around (limit mobility). Where can I get support? Chronic back pain can be a frustrating condition to manage. It may help to talk with other people who are having a similar experience. Consider joining a support group for people dealing with chronic back pain. Ask your health care provider about support groups in your area. You can also find online and in-person support groups through:  The American Chronic Pain Association: DeluxeOption.si  The U.S. Pain Foundation: uspainfoundation.org/support-groups Contact a health care provider if:  Your  symptoms do not get better or they get worse.  You have severe back pain.  You have chronic back pain and a fever.  You lose weight without trying.  You have difficulty urinating.  You experience numbness or tingling.  You develop new pain after an injury. Summary  Chronic back pain is often treated with rest, pain relief, and physical therapy.  Get regular exercise to improve your strength and flexibility.  Put heat and ice on the affected areas as directed by your health care provider.  Chronic back pain can be challenging to live with. Joining a support group may help you manage your condition. This information is not intended to replace advice given to you by your health care provider. Make sure you discuss any questions you have with your health care provider. Document Released: 05/24/2015 Document Revised: 01/16/2016 Document Reviewed: 01/16/2016 Elsevier Interactive Patient Education  2019 [employer].

## 2018-10-24 NOTE — Telephone Encounter (Signed)
[id number] Pre-Screening Questions: 10/24/18  Do you currently have a fever (>100 F), chills or unexplained body aches? NO  Are you currently experiencing new cough, shortness of breath, sore throat, runny nose? NO   Have you recently travelled outside the state of [place] in the last 14 days? NO   Have you been in contact with someone that is currently pending confirmation of Covid19 testing or has been confirmed to have the [place] virus? NO   **If the patient answers NO to ALL questions -  advise the patient to please call the clinic before coming to the office should any symptoms develop.

## 2018-10-25 ENCOUNTER — Encounter: Payer: Self-pay | Admitting: Infectious Disease

## 2018-10-25 ENCOUNTER — Ambulatory Visit (INDEPENDENT_AMBULATORY_CARE_PROVIDER_SITE_OTHER): Payer: Medicare Other | Admitting: Infectious Disease

## 2018-10-25 VITALS — BP 129/88 | HR 89 | Temp 98.3°F | Wt 229.0 lb

## 2018-10-25 DIAGNOSIS — Z79899 Other long term (current) drug therapy: Secondary | ICD-10-CM

## 2018-10-25 DIAGNOSIS — M4620 Osteomyelitis of vertebra, site unspecified: Secondary | ICD-10-CM

## 2018-10-25 DIAGNOSIS — B182 Chronic viral hepatitis C: Secondary | ICD-10-CM | POA: Diagnosis not present

## 2018-10-25 DIAGNOSIS — F419 Anxiety disorder, unspecified: Secondary | ICD-10-CM

## 2018-10-25 DIAGNOSIS — R7881 Bacteremia: Secondary | ICD-10-CM

## 2018-10-25 DIAGNOSIS — F3112 Bipolar disorder, current episode manic without psychotic features, moderate: Secondary | ICD-10-CM | POA: Diagnosis not present

## 2018-10-25 DIAGNOSIS — F1990 Other psychoactive substance use, unspecified, uncomplicated: Secondary | ICD-10-CM

## 2018-10-25 DIAGNOSIS — M818 Other osteoporosis without current pathological fracture: Secondary | ICD-10-CM

## 2018-10-25 DIAGNOSIS — M810 Age-related osteoporosis without current pathological fracture: Secondary | ICD-10-CM

## 2018-10-25 HISTORY — DX: Anxiety disorder, unspecified: F41.9

## 2018-10-25 HISTORY — DX: Osteomyelitis of vertebra, site unspecified: M46.20

## 2018-10-25 MED ORDER — LINEZOLID 600 MG PO TABS: 600.0000 mg | ORAL_TABLET | Freq: Two times a day (BID) | ORAL | 0 refills | Status: DC

## 2018-10-25 NOTE — Progress Notes (Signed)
Subjective:   Chief complaint: lowe back pain and anxiety   Patient ID: Howard Fox, male    DOB: [birth year], 58 y.o.   MRN: [id number]  HPI  58 y.o. male with  T 10-11 diskitis, osteomyelitis and now found to have MRSA bacteremia. 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  We treated him with IV vancomycin in house. TTE was negative. TEE not done. I changed him to zyvox adjusting his celexa down at same time. He is nearing week 2 end of zyvox and doing welll Back pain improving.   He is suffering from anxiety and requested benzodiazepene which I told him I could not fill for him.  He would very much like to get his HCV treated and claims to have been approved [facility] a year ago.  Past Medical History:  Diagnosis Date  . Bipolar 1 disorder ([place])   . Hepatitis C   . Hypertension   . Kidney stones     Past Surgical History:  Procedure Laterality Date  . CHOLECYSTECTOMY      Family History  Problem Relation Age of Onset  . Mental illness Mother        Bipolar  . Stroke Father   . Arthritis Father       Social History   Socioeconomic History  . Marital status: Divorced    Spouse name: Not on file  . Number of children: Not on file  . Years of education: Not on file  . Highest education level: Not on file  Occupational History  . Not on file  Social Needs  . Financial resource strain: Not on file  . Food insecurity:    Worry: Not on file    Inability: Not on file  . Transportation needs:    Medical: Not on file    Non-medical: Not on file  Tobacco Use  . Smoking status: Current Every Day Smoker    Packs/day: 0.50    Years: 42.00    Pack years: 21.00    Types: Cigarettes    Start date: 01/02/1977  . Smokeless tobacco: Never Used  Substance and Sexual Activity  . Alcohol use: Yes    Alcohol/week: 2.0  standard drinks    Types: 2 Cans of beer per week  . Drug use: Yes    Types: Marijuana  . Sexual activity: Not on file  Lifestyle  . Physical activity:    Days per week: Not on file    Minutes per session: Not on file  . Stress: Not on file  Relationships  . Social connections:    Talks on phone: Not on file    Gets together: Not on file    Attends religious service: Not on file    Active member of club or organization: Not on file    Attends meetings of clubs or organizations: Not on file    Relationship status: Not on file  Other Topics Concern  . Not on file  Social History Narrative  . Not on file    Allergies  Allergen Reactions  . Codeine Itching  . Dilaudid [Hydromorphone Hcl] Itching     Current Outpatient Medications:  .  albuterol (PROVENTIL HFA;VENTOLIN HFA) 108 (90 Base) MCG/ACT inhaler, INHALE 2 PUFFS BY MOUTH EVERY 4 HOURS AS NEEDED FOR WHEEZING AND FOR SHORTNESS OF  BREATH, Disp: 18 each, Rfl: 1 .  amitriptyline (ELAVIL) 150 MG tablet, Take 1 tablet at Bedtime for Sleep & Mood, Disp: 90 tablet, Rfl: 1 .  aspirin EC 325 MG tablet, Take 325 mg by mouth daily. , Disp: , Rfl:  .  carisoprodol (SOMA) 350 MG tablet, Take 350 mg by mouth every 6 (six) hours as needed for muscle spasms. , Disp: , Rfl:  .  Cholecalciferol (VITAMIN D) 50 MCG (2000 UT) tablet, Take 8,000 Units by mouth daily. , Disp: , Rfl:  .  citalopram (CELEXA) 10 MG tablet, Take 1 tablet (10 mg total) by mouth daily. (Patient taking differently: Take 20 mg by mouth daily. ), Disp: 30 tablet, Rfl: 0 .  Cyanocobalamin (VITAMIN B-12) 1000 MCG SUBL, Place 1,000 mcg under the tongue every other day. , Disp: , Rfl:  .  gabapentin (NEURONTIN) 300 MG capsule, Take 1 capsule (300 mg total) by mouth 3 (three) times daily as needed. For pain., Disp: 90 capsule, Rfl: 2 .  ibuprofen (ADVIL,MOTRIN) 200 MG tablet, Take 800 mg by mouth every 4 (four) hours as needed for moderate pain. Takes 6 tablets every four hours,  Disp: , Rfl:  .  linezolid (ZYVOX) 600 MG tablet, Take 1 tablet (600 mg total) by mouth every 12 (twelve) hours., Disp: 24 tablet, Rfl: 0 .  lisinopril-hydrochlorothiazide (PRINZIDE,ZESTORETIC) 20-25 MG tablet, TAKE ONE TABLET BY MOUTH ONCE DAILY (Patient taking differently: Take 0.5 tablets by mouth daily. ), Disp: 90 tablet, Rfl: 1 .  Menthol-Camphor (ICY HOT ADVANCED RELIEF) 16-11 % CREA, Apply 1 application topically 3 (three) times daily as needed (pain)., Disp: , Rfl:  .  polyethylene glycol (MIRALAX / GLYCOLAX) 17 g packet, Take 17 g by mouth daily as needed., Disp: 14 each, Rfl: 0 .  rosuvastatin (CRESTOR) 40 MG tablet, Take 1/2 to 1 tablet daily or as directed for Cholesterol (Patient taking differently: Take 40 mg by mouth daily. ), Disp: 90 tablet, Rfl: 1    Review of Systems  Constitutional: Negative for chills and fever.  HENT: Negative for congestion and sore throat.   Eyes: Negative for photophobia.  Respiratory: Negative for cough, shortness of breath and wheezing.   Cardiovascular: Negative for chest pain, palpitations and leg swelling.  Gastrointestinal: Negative for abdominal pain, blood in stool, constipation, diarrhea, nausea and vomiting.  Genitourinary: Negative for dysuria, flank pain and hematuria.  Musculoskeletal: Positive for back pain. Negative for myalgias.  Skin: Negative for rash.  Neurological: Negative for dizziness, weakness and headaches.  Hematological: Does not bruise/bleed easily.  Psychiatric/Behavioral: Negative for suicidal ideas. The patient is nervous/anxious.        Objective:   Physical Exam Constitutional:      General: He is not in acute distress.    Appearance: Normal appearance. He is well-developed. He is not ill-appearing or diaphoretic.  HENT:     Head: Normocephalic and atraumatic.     Right Ear: Hearing and external ear normal.     Left Ear: Hearing and external ear normal.     Nose: No nasal deformity or rhinorrhea.  Eyes:      General: No scleral icterus.    Conjunctiva/sclera: Conjunctivae normal.     Right eye: Right conjunctiva is not injected.     Left eye: Left conjunctiva is not injected.     Pupils: Pupils are equal, round, and reactive to light.  Neck:     Musculoskeletal: Normal range of motion and neck supple.  Vascular: No JVD.  Cardiovascular:     Rate and Rhythm: Normal rate and regular rhythm.     Heart sounds: S1 normal and S2 normal.  Pulmonary:     Effort: Pulmonary effort is normal. No respiratory distress.     Breath sounds: No wheezing.  Abdominal:     General: There is no distension.     Palpations: Abdomen is soft.  Musculoskeletal: Normal range of motion.     Right shoulder: Normal.     Left shoulder: Normal.     Right hip: Normal.     Left hip: Normal.     Right knee: Normal.     Left knee: Normal.  Lymphadenopathy:     Head:     Right side of head: No submandibular, preauricular or posterior auricular adenopathy.     Left side of head: No submandibular, preauricular or posterior auricular adenopathy.     Cervical: No cervical adenopathy.     Right cervical: No superficial or deep cervical adenopathy.    Left cervical: No superficial or deep cervical adenopathy.  Skin:    General: Skin is warm and dry.     Coloration: Skin is not pale.     Findings: No abrasion, bruising, ecchymosis, erythema, lesion or rash.     Nails: There is no clubbing.   Neurological:     General: No focal deficit present.     Mental Status: He is alert and oriented to person, place, and time.     Sensory: No sensory deficit.     Coordination: Coordination normal.     Gait: Gait normal.  Psychiatric:        Attention and Perception: He is attentive.        Mood and Affect: Mood normal.        Speech: Speech normal.        Behavior: Behavior normal. Behavior is cooperative.        Thought Content: Thought content normal.        Judgment: Judgment normal.           Assessment & Plan:    MRSA bacteremia with diskitis: check labs today. If CBC OK continue with zyvox x 2 weeks then change to doxy x another month  Chronic HCV without hepatic com: recheck fibrosure. Will aim to treat this but not initiate yet.  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 I am a bit skeptical  I spent greater than 25 minutes with the patient including greater than 50% of time in face to face counsel of the patient re how we would treat his MRSA diskits, osteomyelitis, his HCV and in coordination of his  care.

## 2018-10-28 LAB — CBC WITH DIFFERENTIAL/PLATELET
Absolute Monocytes: 784 cells/uL (ref 200–950)
Basophils Absolute: 98 cells/uL (ref 0–200)
Basophils Relative: 1 %
Eosinophils Absolute: 274 cells/uL (ref 15–500)
Eosinophils Relative: 2.8 %
HCT: 39.9 % (ref 38.5–50.0)
Hemoglobin: 13.8 g/dL (ref 13.2–17.1)
Lymphs Abs: 1509 cells/uL (ref 850–3900)
MCH: 30.8 pg (ref 27.0–33.0)
MCHC: 34.6 g/dL (ref 32.0–36.0)
MCV: 89.1 fL (ref 80.0–100.0)
MPV: 8.8 fL (ref 7.5–12.5)
Monocytes Relative: 8 %
Neutro Abs: 7134 cells/uL (ref 1500–7800)
Neutrophils Relative %: 72.8 %
Platelets: 429 10*3/uL — ABNORMAL HIGH (ref 140–400)
RBC: 4.48 10*6/uL (ref 4.20–5.80)
RDW: 12.4 % (ref 11.0–15.0)
Total Lymphocyte: 15.4 %
WBC: 9.8 10*3/uL (ref 3.8–10.8)

## 2018-10-28 LAB — VITAMIN D 1,25 DIHYDROXY
Vitamin D 1, 25 (OH)2 Total: 23 pg/mL (ref 18–72)
Vitamin D2 1, 25 (OH)2: <8 pg/mL
Vitamin D3 1, 25 (OH)2: 23 pg/mL

## 2018-10-28 LAB — SEDIMENTATION RATE: Sed Rate: 46 mm/h — ABNORMAL HIGH (ref 0–20)

## 2018-10-28 LAB — HEMOGLOBIN A1C
Hgb A1c MFr Bld: 5.6 % of total Hgb (ref <5.7)
Mean Plasma Glucose: 114 (calc)
eAG (mmol/L): 6.3 (calc)

## 2018-10-28 LAB — COMPLETE METABOLIC PANEL WITH GFR
AG Ratio: 1.2 (calc) (ref 1.0–2.5)
ALT: 22 U/L (ref 9–46)
AST: 25 U/L (ref 10–35)
Albumin: 4.2 g/dL (ref 3.6–5.1)
Alkaline phosphatase (APISO): 90 U/L (ref 35–144)
BUN: 22 mg/dL (ref 7–25)
CO2: 25 mmol/L (ref 20–32)
Calcium: 10 mg/dL (ref 8.6–10.3)
Chloride: 101 mmol/L (ref 98–110)
Creat: 0.93 mg/dL (ref 0.70–1.33)
GFR, Est African American: 105 mL/min/{1.73_m2} (ref >=60)
GFR, Est Non African American: 91 mL/min/{1.73_m2} (ref >=60)
Globulin: 3.6 g/dL (calc) (ref 1.9–3.7)
Glucose, Bld: 104 mg/dL — ABNORMAL HIGH (ref 65–99)
Potassium: 5.2 mmol/L (ref 3.5–5.3)
Sodium: 138 mmol/L (ref 135–146)
Total Bilirubin: 0.3 mg/dL (ref 0.2–1.2)
Total Protein: 7.8 g/dL (ref 6.1–8.1)

## 2018-10-28 LAB — C-REACTIVE PROTEIN: CRP: 23.9 mg/L — ABNORMAL HIGH (ref <8.0)

## 2018-10-28 LAB — MAGNESIUM: Magnesium: 2.3 mg/dL (ref 1.5–2.5)

## 2018-10-28 LAB — PROTIME-INR
INR: 1
Prothrombin Time: 10 s (ref 9.0–11.5)

## 2018-10-28 LAB — VITAMIN D 25 HYDROXY (VIT D DEFICIENCY, FRACTURES): Vit D, 25-Hydroxy: 48 ng/mL (ref 30–100)

## 2018-10-29 ENCOUNTER — Telehealth: Payer: Self-pay

## 2018-10-29 NOTE — Telephone Encounter (Signed)
Wants to make sure we received his lab results and also would like to know if we could do the referral to [facility], Dr. Nicholaus Bloom.

## 2018-10-30 NOTE — Telephone Encounter (Signed)
Katrina aware and patient will be notified to increase Vitamin D from 8000 to 10000

## 2018-11-06 ENCOUNTER — Ambulatory Visit (INDEPENDENT_AMBULATORY_CARE_PROVIDER_SITE_OTHER): Payer: Medicare Other | Admitting: Infectious Disease

## 2018-11-06 ENCOUNTER — Encounter: Payer: Self-pay | Admitting: Infectious Disease

## 2018-11-06 ENCOUNTER — Other Ambulatory Visit: Payer: Self-pay

## 2018-11-06 ENCOUNTER — Telehealth: Payer: Self-pay | Admitting: Pharmacy Technician

## 2018-11-06 VITALS — BP 121/84 | HR 96 | Ht 71.0 in | Wt 232.0 lb

## 2018-11-06 DIAGNOSIS — M4620 Osteomyelitis of vertebra, site unspecified: Secondary | ICD-10-CM

## 2018-11-06 DIAGNOSIS — F1990 Other psychoactive substance use, unspecified, uncomplicated: Secondary | ICD-10-CM | POA: Diagnosis not present

## 2018-11-06 DIAGNOSIS — R7881 Bacteremia: Secondary | ICD-10-CM | POA: Diagnosis not present

## 2018-11-06 DIAGNOSIS — B9562 Methicillin resistant Staphylococcus aureus infection as the cause of diseases classified elsewhere: Secondary | ICD-10-CM

## 2018-11-06 DIAGNOSIS — B182 Chronic viral hepatitis C: Secondary | ICD-10-CM

## 2018-11-06 MED ORDER — SOFOSBUVIR-VELPATASVIR 400-100 MG PO TABS: 1.0000 | ORAL_TABLET | Freq: Every day | ORAL | 2 refills | Status: DC

## 2018-11-06 NOTE — Telephone Encounter (Addendum)
[facility] Patient Advocate Encounter    Findings of the benefits investigation conducted this morning via test claims for the patient's upcoming appointment on 11/06/2018 are as follows:   Insurance: [employer] -active Prior Authorization: [id number] status pending  (p) [phone] typically 24-72 hours response  [facility] Patient Advocate will follow up with patient once we have a determination from the insurance company. Met with him today and provided him with pharmacy information and explained the refill process. He is concerned about cost, there are options for patient assistance currently available to cover the copayment cost.  Howard Fox, [place] Patient [facility] Phone: [phone] Fax: [phone] 11/06/2018 3:18 PM

## 2018-11-06 NOTE — Progress Notes (Signed)
Subjective:   Chief complaint: low back pain   Patient ID: Howard Fox, male    DOB: 09/28/1960, 58 y.o.   MRN: [id number]  HPI  58 y.o. male with  T 10-11 diskitis, osteomyelitis and now found to have MRSA bacteremia. 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  We treated him with IV vancomycin in house. TTE was negative. TEE not done. I changed him to zyvox adjusting his celexa down at same time. He is nearing week 2 end of zyvox and doing welll Back pain improving.   We did check his CBC and it was fine on Zyvox and continued additional 2 weeks of Zyvox with plans to change him over doxycycline he is still in the midst of taking the Zyvox therapy.  His lower back pain has improved but still exists.  He is eager to be treated with hepatitis C medication.  He was previously treated ribavirin and pegylated interferon in the past and a trial.  .  Past Medical History:  Diagnosis Date  . Anxiety 10/25/2018  . Bipolar 1 disorder ([place])   . Hepatitis C   . Hypertension   . Kidney stones   . Vertebral osteomyelitis ([place]) 10/25/2018    Past Surgical History:  Procedure Laterality Date  . CHOLECYSTECTOMY      Family History  Problem Relation Age of Onset  . Mental illness Mother        Bipolar  . Stroke Father   . Arthritis Father       Social History   Socioeconomic History  . Marital status: Divorced    Spouse name: Not on file  . Number of children: Not on file  . Years of education: Not on file  . Highest education level: Not on file  Occupational History  . Not on file  Social Needs  . Financial resource strain: Not on file  . Food insecurity    Worry: Not on file    Inability: Not on file  . Transportation needs    Medical: Not on file    Non-medical: Not on file  Tobacco Use  . Smoking status: Current Every Day Smoker   Packs/day: 0.50    Years: 42.00    Pack years: 21.00    Types: Cigarettes    Start date: 01/02/1977  . Smokeless tobacco: Never Used  Substance and Sexual Activity  . Alcohol use: Yes    Alcohol/week: 2.0 standard drinks    Types: 2 Cans of beer per week  . Drug use: Yes    Types: Marijuana  . Sexual activity: Not on file  Lifestyle  . Physical activity    Days per week: Not on file    Minutes per session: Not on file  . Stress: Not on file  Relationships  . Social Herbalist on phone: Not on file    Gets together: Not on file    Attends religious service: Not on file    Active member of club or organization: Not on file    Attends meetings of clubs or organizations: Not on file    Relationship status: Not on file  Other Topics Concern  . Not on file  Social History Narrative  . Not on file    Allergies  Allergen Reactions  . Codeine Itching  . Dilaudid [  Hydromorphone Hcl] Itching     Current Outpatient Medications:  .  albuterol (PROVENTIL HFA;VENTOLIN HFA) 108 (90 Base) MCG/ACT inhaler, INHALE 2 PUFFS BY MOUTH EVERY 4 HOURS AS NEEDED FOR WHEEZING AND FOR SHORTNESS OF BREATH, Disp: 18 each, Rfl: 1 .  amitriptyline (ELAVIL) 150 MG tablet, Take 1 tablet at Bedtime for Sleep & Mood, Disp: 90 tablet, Rfl: 1 .  aspirin EC 325 MG tablet, Take 325 mg by mouth daily. , Disp: , Rfl:  .  carisoprodol (SOMA) 350 MG tablet, Take 350 mg by mouth every 6 (six) hours as needed for muscle spasms. , Disp: , Rfl:  .  Cholecalciferol (VITAMIN D) 50 MCG (2000 UT) tablet, Take 8,000 Units by mouth daily. , Disp: , Rfl:  .  citalopram (CELEXA) 10 MG tablet, Take 1 tablet (10 mg total) by mouth daily. (Patient taking differently: Take 20 mg by mouth daily. ), Disp: 30 tablet, Rfl: 0 .  Cyanocobalamin (VITAMIN B-12) 1000 MCG SUBL, Place 1,000 mcg under the tongue every other day. , Disp: , Rfl:  .  gabapentin (NEURONTIN) 300 MG capsule, Take 1 capsule (300 mg total) by mouth 3  (three) times daily as needed. For pain., Disp: 90 capsule, Rfl: 2 .  ibuprofen (ADVIL,MOTRIN) 200 MG tablet, Take 800 mg by mouth every 4 (four) hours as needed for moderate pain. Takes 6 tablets every four hours, Disp: , Rfl:  .  linezolid (ZYVOX) 600 MG tablet, Take 1 tablet (600 mg total) by mouth every 12 (twelve) hours., Disp: 24 tablet, Rfl: 0 .  lisinopril-hydrochlorothiazide (PRINZIDE,ZESTORETIC) 20-25 MG tablet, TAKE ONE TABLET BY MOUTH ONCE DAILY (Patient taking differently: Take 0.5 tablets by mouth daily. ), Disp: 90 tablet, Rfl: 1 .  Menthol-Camphor (ICY HOT ADVANCED RELIEF) 16-11 % CREA, Apply 1 application topically 3 (three) times daily as needed (pain)., Disp: , Rfl:  .  polyethylene glycol (MIRALAX / GLYCOLAX) 17 g packet, Take 17 g by mouth daily as needed., Disp: 14 each, Rfl: 0 .  rosuvastatin (CRESTOR) 40 MG tablet, Take 1/2 to 1 tablet daily or as directed for Cholesterol (Patient taking differently: Take 40 mg by mouth daily. ), Disp: 90 tablet, Rfl: 1    Review of Systems  Constitutional: Negative for chills and fever.  HENT: Negative for congestion and sore throat.   Eyes: Negative for photophobia.  Respiratory: Negative for cough, shortness of breath and wheezing.   Cardiovascular: Negative for chest pain, palpitations and leg swelling.  Gastrointestinal: Negative for abdominal pain, blood in stool, constipation, diarrhea, nausea and vomiting.  Genitourinary: Negative for dysuria, flank pain and hematuria.  Musculoskeletal: Positive for back pain. Negative for myalgias.  Skin: Negative for rash.  Neurological: Negative for dizziness, weakness and headaches.  Hematological: Does not bruise/bleed easily.  Psychiatric/Behavioral: Negative for suicidal ideas.       Objective:   Physical Exam Constitutional:      General: He is not in acute distress.    Appearance: Normal appearance. He is well-developed. He is not ill-appearing or diaphoretic.  HENT:     Head:  Normocephalic and atraumatic.     Right Ear: Hearing and external ear normal.     Left Ear: Hearing and external ear normal.     Nose: No nasal deformity or rhinorrhea.  Eyes:     General: No scleral icterus.    Extraocular Movements: Extraocular movements intact.     Conjunctiva/sclera: Conjunctivae normal.     Right eye: Right conjunctiva is not injected.  Left eye: Left conjunctiva is not injected.  Neck:     Musculoskeletal: Normal range of motion and neck supple.     Vascular: No JVD.  Cardiovascular:     Rate and Rhythm: Normal rate and regular rhythm.     Heart sounds: S1 normal and S2 normal.  Pulmonary:     Effort: Pulmonary effort is normal. No respiratory distress.     Breath sounds: No wheezing.  Abdominal:     General: There is no distension.     Palpations: Abdomen is soft.  Musculoskeletal: Normal range of motion.     Right shoulder: Normal.     Left shoulder: Normal.     Right hip: Normal.     Left hip: Normal.     Right knee: Normal.     Left knee: Normal.  Lymphadenopathy:     Head:     Right side of head: No submandibular, preauricular or posterior auricular adenopathy.     Left side of head: No submandibular, preauricular or posterior auricular adenopathy.     Cervical: No cervical adenopathy.     Right cervical: No superficial or deep cervical adenopathy.    Left cervical: No superficial or deep cervical adenopathy.  Skin:    General: Skin is warm and dry.     Coloration: Skin is not pale.     Findings: No abrasion, bruising, ecchymosis, erythema, lesion or rash.     Nails: There is no clubbing.   Neurological:     General: No focal deficit present.     Mental Status: He is alert and oriented to person, place, and time.     Sensory: No sensory deficit.     Coordination: Coordination normal.     Gait: Gait normal.  Psychiatric:        Attention and Perception: He is attentive.        Mood and Affect: Mood normal.        Speech: Speech normal.         Behavior: Behavior normal. Behavior is cooperative.        Thought Content: Thought content normal.        Judgment: Judgment normal.           Assessment & Plan:   MRSA bacteremia with diskitis: check labs today.   His sed rate is trending the right direction as is his pain trajectory.  He is nearing completion of Zyvox will check labs today.  Will change over to doxycycline when he finishes the Zyvox certainly will do that earlier if he has thrombocytopenia at this point time.  Chronic HCV without hepatic com: recheck fibrosure today.  I have written prescription for Epclusa but he will need prior approval.    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   I spent greater than 25 minutes with the patient including greater than 50% of time in face to face counsel of the patient regarding the nature of MRSA bloodstream infections deep infections, the way that we treat chronic hepatitis C, need to abstain from intravenous drugs and risks associated with these and in coordination of his care.

## 2018-11-07 ENCOUNTER — Telehealth: Payer: Self-pay | Admitting: Pharmacist

## 2018-11-07 ENCOUNTER — Ambulatory Visit: Payer: Medicare Other | Admitting: Adult Health

## 2018-11-07 MED FILL — SOFOSBUVIR-VELPATASVIR 400-: 400-100 | 28 days supply | Qty: 28 | Fill #0

## 2018-11-07 NOTE — Telephone Encounter (Signed)
AWESOME!

## 2018-11-07 NOTE — Telephone Encounter (Signed)
Awesome!

## 2018-11-07 NOTE — Telephone Encounter (Signed)
Patient is approved to receive Epclusa x 12 weeks for chronic Hepatitis C infection. Counseled patient to take Epclusa daily with or without food. Encouraged patient not to miss any doses and explained how their chance of cure could go down with each dose missed. Counseled patient on what to do if dose is missed - if it is closer to the missed dose take immediately; if closer to next dose skip dose and take the next dose at the usual time.   Counseled patient on common side effects such as headache, fatigue, and nausea and that these normally decrease with time. I reviewed patient medications and found a drug interaction with his Crestor. Patient states that he may be able to come off of it due to his recent cholesterol numbers (which are good).  I told him to at least stop it for the 3 months that he is on [place] and he agrees with the plan. Discussed with patient that there are several drug interactions including acid suppressants. Instructed patient to call clinic if he wishes to start a new medication during course of therapy. Also advised patient to call if he experiences any side effects. Patient will follow-up with me in the pharmacy clinic on 7/16 at 1130am.

## 2018-11-07 NOTE — Telephone Encounter (Signed)
Patient picked up medication today 11/07/2018 at 12:51 from [facility]

## 2018-11-07 NOTE — Telephone Encounter (Signed)
[facility] Patient Advocate Encounter  Prior Authorization for Raeanne Gathers has been approved to cover 12 weeks.    PA# [id number] Effective dates: 11/06/2018 through 01/29/2019 Patients co-pay is $2320.52.   [facility] Patient Advocate Encounter   I was successful in securing patient a $30,000 grant from [employer] to provide copayment coverage for Epclusa. This will make the out of pocket cost $0.     The billing information is as follows and has been shared with [facility].   RxBin: [id number] PCN: [id number] Member ID: [id number] Group ID: [id number] Dates of Eligibility: 10/08/2018 through 10/07/2019  Will precede once the medication is ready to be started and follow-up with the patient at that time.

## 2018-11-08 DIAGNOSIS — M545 Low back pain: Secondary | ICD-10-CM | POA: Diagnosis not present

## 2018-11-08 DIAGNOSIS — R0781 Pleurodynia: Secondary | ICD-10-CM | POA: Diagnosis not present

## 2018-11-09 LAB — CBC WITH DIFFERENTIAL/PLATELET
Absolute Monocytes: 639 cells/uL (ref 200–950)
Basophils Absolute: 58 cells/uL (ref 0–200)
Basophils Relative: 0.7 %
Eosinophils Absolute: 183 cells/uL (ref 15–500)
Eosinophils Relative: 2.2 %
HCT: 41.7 % (ref 38.5–50.0)
Hemoglobin: 14.3 g/dL (ref 13.2–17.1)
Lymphs Abs: 1627 cells/uL (ref 850–3900)
MCH: 30.6 pg (ref 27.0–33.0)
MCHC: 34.3 g/dL (ref 32.0–36.0)
MCV: 89.3 fL (ref 80.0–100.0)
MPV: 9.5 fL (ref 7.5–12.5)
Monocytes Relative: 7.7 %
Neutro Abs: 5793 cells/uL (ref 1500–7800)
Neutrophils Relative %: 69.8 %
Platelets: 282 10*3/uL (ref 140–400)
RBC: 4.67 10*6/uL (ref 4.20–5.80)
RDW: 13 % (ref 11.0–15.0)
Total Lymphocyte: 19.6 %
WBC: 8.3 10*3/uL (ref 3.8–10.8)

## 2018-11-09 LAB — LIVER FIBROSIS, FIBROTEST-ACTITEST
ALT: 20 U/L (ref 9–46)
Alpha-2-Macroglobulin: 292 mg/dL — ABNORMAL HIGH (ref 106–279)
Apolipoprotein A1: 164 mg/dL (ref 94–176)
Bilirubin: 0.2 mg/dL (ref 0.2–1.2)
Fibrosis Score: 0.21
GGT: 71 U/L (ref 3–85)
Haptoglobin: 297 mg/dL — ABNORMAL HIGH (ref 43–212)
Necroinflammat ACT Score: 0.07
Reference ID: 2976896

## 2018-11-09 LAB — COMPLETE METABOLIC PANEL WITH GFR
AG Ratio: 1.1 (calc) (ref 1.0–2.5)
ALT: 21 U/L (ref 9–46)
AST: 28 U/L (ref 10–35)
Albumin: 3.9 g/dL (ref 3.6–5.1)
Alkaline phosphatase (APISO): 105 U/L (ref 35–144)
BUN: 11 mg/dL (ref 7–25)
CO2: 24 mmol/L (ref 20–32)
Calcium: 9.9 mg/dL (ref 8.6–10.3)
Chloride: 101 mmol/L (ref 98–110)
Creat: 0.8 mg/dL (ref 0.70–1.33)
GFR, Est African American: 115 mL/min/{1.73_m2} (ref >=60)
GFR, Est Non African American: 99 mL/min/{1.73_m2} (ref >=60)
Globulin: 3.7 g/dL (calc) (ref 1.9–3.7)
Glucose, Bld: 90 mg/dL (ref 65–99)
Potassium: 5.2 mmol/L (ref 3.5–5.3)
Sodium: 137 mmol/L (ref 135–146)
Total Bilirubin: 0.3 mg/dL (ref 0.2–1.2)
Total Protein: 7.6 g/dL (ref 6.1–8.1)

## 2018-11-09 LAB — SEDIMENTATION RATE: Sed Rate: 14 mm/h (ref 0–20)

## 2018-11-09 LAB — C-REACTIVE PROTEIN: CRP: 4.8 mg/L (ref <8.0)

## 2018-11-19 ENCOUNTER — Other Ambulatory Visit: Payer: Self-pay | Admitting: Internal Medicine

## 2018-11-25 ENCOUNTER — Encounter (HOSPITAL_COMMUNITY): Payer: Self-pay

## 2018-11-25 ENCOUNTER — Other Ambulatory Visit: Payer: Self-pay

## 2018-11-25 ENCOUNTER — Emergency Department (HOSPITAL_COMMUNITY)
Admission: EM | Admit: 2018-11-25 | Discharge: 2018-11-25 | Disposition: A | Discharge status: Home / Self Care | Payer: Medicare Other | Attending: Emergency Medicine | Admitting: Emergency Medicine

## 2018-11-25 ENCOUNTER — Emergency Department (HOSPITAL_COMMUNITY): Payer: Medicare Other

## 2018-11-25 DIAGNOSIS — I10 Essential (primary) hypertension: Secondary | ICD-10-CM | POA: Diagnosis not present

## 2018-11-25 DIAGNOSIS — R404 Transient alteration of awareness: Secondary | ICD-10-CM | POA: Diagnosis not present

## 2018-11-25 DIAGNOSIS — R0781 Pleurodynia: Secondary | ICD-10-CM | POA: Diagnosis not present

## 2018-11-25 DIAGNOSIS — G8929 Other chronic pain: Secondary | ICD-10-CM | POA: Diagnosis not present

## 2018-11-25 DIAGNOSIS — Z7982 Long term (current) use of aspirin: Secondary | ICD-10-CM | POA: Insufficient documentation

## 2018-11-25 DIAGNOSIS — F17210 Nicotine dependence, cigarettes, uncomplicated: Secondary | ICD-10-CM | POA: Diagnosis not present

## 2018-11-25 DIAGNOSIS — Z79899 Other long term (current) drug therapy: Secondary | ICD-10-CM | POA: Diagnosis not present

## 2018-11-25 DIAGNOSIS — R109 Unspecified abdominal pain: Secondary | ICD-10-CM | POA: Insufficient documentation

## 2018-11-25 DIAGNOSIS — R52 Pain, unspecified: Secondary | ICD-10-CM | POA: Diagnosis not present

## 2018-11-25 MED ORDER — KETOROLAC TROMETHAMINE 60 MG/2ML IM SOLN
60.0000 mg | Freq: Once | INTRAMUSCULAR | Status: AC
  Administered 2018-11-25: 06:00:00 60 mg via INTRAMUSCULAR
  Filled 2018-11-25: qty 2

## 2018-11-25 NOTE — ED Notes (Signed)
Patient transported to radiology

## 2018-11-25 NOTE — ED Notes (Signed)
Bed: VL31 Expected date:  Expected time:  Means of arrival:  Comments: EMS chronic flank pain

## 2018-11-25 NOTE — Discharge Instructions (Addendum)
Take your prescribed pain medications.  Follow-up with your orthopedic doctor.  Return emergency department for any worsening pain, fever or any other worsening concerning symptoms.

## 2018-11-25 NOTE — ED Provider Notes (Signed)
[facility] DEPT Provider Note   CSN: [id number] Arrival date & time: 11/25/18  0448    History   Chief Complaint Chief Complaint  Patient presents with   Chronic Left Flank Pain    HPI Howard Fox is a 58 y.o. male past medical history of bipolar, hepatitis, hypertension, vertebral osteomyelitis who presents for evaluation of left flank pain.  Patient reports that this is been an ongoing issue for several months.  He reports that he has had chronic pain secondary to rib fractures that had happened in years prior.  He states that he had a repeat fall in May and since then, his pain has gotten progressively worse.  Patient states that he is seen by orthopedics for evaluation of pain and does not have a new appointment with them.  He reports that he will sometimes take his pain medication but that is not helping.  He has been instructed that he needs to follow-up with the pain doctor but he has not been referred to 1.  He denies any new trauma, injury, fall.  He denies any fevers, numbness/weakness of his arms or legs, urinary or bowel incontinence, saddle anesthesia, dysuria, hematuria, vomiting, chest pain, difficulty breathing.      The history is provided by the patient.    Past Medical History:  Diagnosis Date   Anxiety 10/25/2018   Bipolar 1 disorder ([place])    Hepatitis C    Hypertension    Kidney stones    Vertebral osteomyelitis ([place]) 10/25/2018    Patient Active Problem List   Diagnosis Date Noted   Vertebral osteomyelitis ([place]) 10/25/2018   Anxiety 10/25/2018   Cocaine use 10/24/2018   Marijuana smoker 10/24/2018   Subacute osteomyelitis, other site [facility])    IVDU (intravenous drug user)    Back pain    MRSA bacteremia 10/09/2018   Diskitis 10/08/2018   Chest wall pain 08/31/2018   Prediabetes 06/26/2018   Hepatitis C, chronic ([place]) 04/01/2015   Tobacco use disorder 04/01/2015   Other abnormal glucose 12/10/2014     Obesity (BMI 30.0-34.9) 12/10/2014   Medication management 12/03/2013   Essential hypertension 04/25/2013   Hyperlipidemia, mixed 04/25/2013   Vitamin D deficiency 04/25/2013   Bipolar disorder ([place]) 02/19/2007    Past Surgical History:  Procedure Laterality Date   CHOLECYSTECTOMY          Home Medications    Prior to Admission medications   Medication Sig Start Date End Date Taking? Authorizing Provider  albuterol (PROVENTIL HFA;VENTOLIN HFA) 108 (90 Base) MCG/ACT inhaler INHALE 2 PUFFS BY MOUTH EVERY 4 HOURS AS NEEDED FOR WHEEZING AND FOR SHORTNESS OF BREATH 08/21/17   Unk Pinto, MD  amitriptyline (ELAVIL) 150 MG tablet Take 1 tablet at Bedtime for Sleep & Mood 09/23/18   Unk Pinto, MD  aspirin EC 325 MG tablet Take 325 mg by mouth daily.     [provider]  carisoprodol (SOMA) 350 MG tablet Take 350 mg by mouth every 6 (six) hours as needed for muscle spasms.     [provider]  Cholecalciferol (VITAMIN D) 50 MCG (2000 UT) tablet Take 8,000 Units by mouth daily.     [provider]  citalopram (CELEXA) 10 MG tablet Take 1 tablet (10 mg total) by mouth daily. Patient taking differently: Take 20 mg by mouth daily.  10/16/18   Shelly Coss, MD  Cyanocobalamin (VITAMIN B-12) 1000 MCG SUBL Place 1,000 mcg under the tongue every other day.  [provider]  gabapentin (NEURONTIN) 300 MG capsule Take 1 capsule (300 mg total) by mouth 3 (three) times daily as needed. For pain. 08/31/18   Liane Comber, NP  ibuprofen (ADVIL,MOTRIN) 200 MG tablet Take 800 mg by mouth every 4 (four) hours as needed for moderate pain. Takes 6 tablets every four hours    [provider]  linezolid (ZYVOX) 600 MG tablet Take 1 tablet (600 mg total) by mouth every 12 (twelve) hours. 10/25/18   Truman Hayward, MD  lisinopril-hydrochlorothiazide (ZESTORETIC) 20-25 MG tablet Take 1 tablet Daily for BP 11/19/18   Unk Pinto, MD   Menthol-Camphor (ICY HOT ADVANCED RELIEF) 16-11 % CREA Apply 1 application topically 3 (three) times daily as needed (pain).    [provider]  oxyCODONE-acetaminophen (PERCOCET) 10-325 MG tablet ORDERED BY DIFFERENT PHYSICIAN, NOT DR VAN DAM 11/01/18   [provider]  polyethylene glycol (MIRALAX / GLYCOLAX) 17 g packet Take 17 g by mouth daily as needed. 10/15/18   Shelly Coss, MD  rosuvastatin (CRESTOR) 40 MG tablet Take 1/2 to 1 tablet daily or as directed for Cholesterol Patient taking differently: Take 40 mg by mouth daily.  09/01/17   Unk Pinto, MD  Sofosbuvir-Velpatasvir (EPCLUSA) 400-100 MG TABS Take 1 tablet by mouth daily. 11/06/18   Truman Hayward, MD    Family History Family History  Problem Relation Age of Onset   Mental illness Mother        Bipolar   Stroke Father    Arthritis Father     Social History Social History   Tobacco Use   Smoking status: Current Every Day Smoker    Packs/day: 0.50    Years: 42.00    Pack years: 21.00    Types: Cigarettes    Start date: 01/02/1977   Smokeless tobacco: Never Used  Substance Use Topics   Alcohol use: Yes    Alcohol/week: 2.0 standard drinks    Types: 2 Cans of beer per week   Drug use: Yes    Types: Marijuana     Allergies   Codeine and Dilaudid [hydromorphone hcl]   Review of Systems Review of Systems  Constitutional: Negative for fever.  Respiratory: Negative for shortness of breath.   Cardiovascular: Negative for chest pain.  Gastrointestinal: Negative for abdominal pain, nausea and vomiting.  Musculoskeletal: Positive for back pain.  Neurological: Negative for weakness and numbness.  All other systems reviewed and are negative.    Physical Exam Updated Vital Signs BP 129/83 (BP Location: Left Arm)    Pulse 85    Temp 98.5 F (36.9 C) (Oral)    Resp 20    SpO2 97%   Physical Exam Vitals signs and nursing note reviewed.  Constitutional:      Appearance:  Normal appearance. He is well-developed.  HENT:     Head: Normocephalic and atraumatic.  Eyes:     General: Lids are normal.     Conjunctiva/sclera: Conjunctivae normal.     Pupils: Pupils are equal, round, and reactive to light.  Neck:     Musculoskeletal: Full passive range of motion without pain.  Cardiovascular:     Rate and Rhythm: Normal rate and regular rhythm.     Pulses: Normal pulses.          Radial pulses are 2+ on the right side and 2+ on the left side.       Dorsalis pedis pulses are 2+ on the right side and 2+  on the left side.     Heart sounds: Normal heart sounds. No murmur. No friction rub. No gallop.   Pulmonary:     Effort: Pulmonary effort is normal.     Breath sounds: Normal breath sounds.     Comments: Lungs clear to auscultation bilaterally.  Symmetric chest rise.  No wheezing, rales, rhonchi.  No evidence of respiratory distress. Abdominal:     Palpations: Abdomen is soft. Abdomen is not rigid.     Tenderness: There is no abdominal tenderness. There is no guarding.     Comments: Abdomen is soft, non-distended, non-tender. No rigidity, No guarding. No peritoneal signs.  Musculoskeletal: Normal range of motion.     Thoracic back: He exhibits no tenderness.     Lumbar back: He exhibits no tenderness.       Back:     Comments: No midline T or L spine tenderness. Diffuse muscular tenderness noted to left paraspinal muscles that extend to the lateral side. No overlying warmth, erythema, edema.   Skin:    General: Skin is warm and dry.     Capillary Refill: Capillary refill takes less than 2 seconds.     Comments: No rash.   Neurological:     Mental Status: He is alert and oriented to person, place, and time.     Comments: Follows commands, Moves all extremities  5/5 strength to BUE and BLE  Sensation intact throughout all major nerve distributions  Psychiatric:        Speech: Speech normal.      ED Treatments / Results  Labs (all labs ordered are  listed, but only abnormal results are displayed) Labs Reviewed - No data to display  EKG None  Radiology Dg Chest 2 View  Result Date: 11/25/2018 CLINICAL DATA:  Worsening left-sided rib pain.  No recent injury. EXAM: CHEST - 2 VIEW COMPARISON:  CT chest dated Oct 08, 2018. Chest x-ray dated August 30, 2018. FINDINGS: The heart size and mediastinal contours are within normal limits. Normal pulmonary vascularity. Chronic scarring at the left lung base with blunting of the left costophrenic angle, similar to prior study. No focal consolidation, pleural effusion, or pneumothorax. No acute osseous abnormality. Chronic left-sided posterior eighth through tenth rib resections and posterior eleventh rib fracture are unchanged. IMPRESSION: Chronic and postsurgical changes. No active cardiopulmonary disease. Electronically Signed   By: Titus Dubin M.D.   On: 11/25/2018 06:44    Procedures Procedures (including critical care time)  Medications Ordered in ED Medications  ketorolac (TORADOL) injection 60 mg (60 mg Intramuscular Given 11/25/18 0606)     Initial Impression / Assessment and Plan / ED Course  I have reviewed the triage vital signs and the nursing notes.  Pertinent labs & imaging results that were available during my care of the patient were reviewed by me and considered in my medical decision making (see chart for details).        58 y.o. M who presents for evaluation for pain to left back/flank pain.  He states that this is been an ongoing issue.  He states that he is taking pain medications but that he feels like he needs more.  Additionally, he states that he has been referred to a pain medicine doctor but has not been able to get in with one yet.  He denies any new trauma, injury, fall.  He is concerned that his ribs have shifted and is cause worsening pain. Patient is afebrile, non-toxic appearing, sitting comfortably on  examination table. Vital signs reviewed and stable. Patient  is neurovascularly intact.  He has tenderness noted to the left flank/left lateral side just at the posterior aspect of the ribs.  Lungs clear to auscultation.  No neuro deficits noted on exam.  He does have history of discitis in May 2020.  He currently is afebrile and denies any urinary or bowel incontinence, saddle anesthesia.  He has no neuro deficits on exam and has been able to ambulate.  History/physical exam not concerning for aortic dissection, ACS etiology, kidney stone.  I do not see any overlying rash that would be concerning for shingles.  At this time, his history/physical exam is not concerning for cauda equina or spinal abscess.  Additionally, doubt infectious etiology.  Consider musculoskeletal pain versus chronic pain.  Will plan for chest x-ray for evaluation.  Chest x-ray shows chronic postsurgical changes.  No evidence of acute abnormality.  The left-sided posterior eighth through 10th rib resections and 11th rib fracture unchanged from previous.  At this time, patient's vitals are stable.  He has been able to ambulate and does not have any concerning neuro deficits or concerning signs on exam.  At this time, I feel that this is most likely chronic pain.  No indication for further work-up here in the ED. Discussed patient with Dr. Randal Buba who is agreeable. Patient had ample opportunity for questions and discussion. All patient's questions were answered with full understanding. Strict return precautions discussed. Patient expresses understanding and agreement to plan.    Portions of this note were generated with [employer]. Dictation errors may occur despite best attempts at proofreading.  Final Clinical Impressions(s) / ED Diagnoses   Final diagnoses:  Chronic flank pain    ED Discharge Orders    None       Desma Mcgregor 11/26/18 Karn Pickler, April, MD 11/27/18 2357

## 2018-11-25 NOTE — ED Triage Notes (Signed)
Patient complaining of chronic left flank pain from a repeat fall in May and the pain has gotten increasingly worse over the past few days. Patient is currently wearing a soft cervical collar because he states that it helps with the pain. Patient was ambulatory with EMS.

## 2018-11-29 ENCOUNTER — Encounter (HOSPITAL_COMMUNITY): Payer: Self-pay | Admitting: Emergency Medicine

## 2018-11-29 ENCOUNTER — Emergency Department (HOSPITAL_COMMUNITY)
Admission: EM | Admit: 2018-11-29 | Discharge: 2018-11-29 | Disposition: A | Discharge status: Home / Self Care | Payer: Medicare Other | Attending: Emergency Medicine | Admitting: Emergency Medicine

## 2018-11-29 DIAGNOSIS — M545 Low back pain, unspecified: Secondary | ICD-10-CM

## 2018-11-29 DIAGNOSIS — G8929 Other chronic pain: Secondary | ICD-10-CM | POA: Diagnosis not present

## 2018-11-29 DIAGNOSIS — Z79899 Other long term (current) drug therapy: Secondary | ICD-10-CM | POA: Insufficient documentation

## 2018-11-29 DIAGNOSIS — M5489 Other dorsalgia: Secondary | ICD-10-CM | POA: Diagnosis not present

## 2018-11-29 DIAGNOSIS — F17210 Nicotine dependence, cigarettes, uncomplicated: Secondary | ICD-10-CM | POA: Insufficient documentation

## 2018-11-29 DIAGNOSIS — Z7982 Long term (current) use of aspirin: Secondary | ICD-10-CM | POA: Diagnosis not present

## 2018-11-29 DIAGNOSIS — I10 Essential (primary) hypertension: Secondary | ICD-10-CM | POA: Diagnosis not present

## 2018-11-29 DIAGNOSIS — R52 Pain, unspecified: Secondary | ICD-10-CM | POA: Diagnosis not present

## 2018-11-29 MED ORDER — KETOROLAC TROMETHAMINE 30 MG/ML IJ SOLN
30.0000 mg | Freq: Once | INTRAMUSCULAR | Status: AC
  Administered 2018-11-29: 11:00:00 30 mg via INTRAMUSCULAR
  Filled 2018-11-29: qty 1

## 2018-11-29 MED ORDER — LIDOCAINE 5 % EX PTCH: 1.0000 | MEDICATED_PATCH | CUTANEOUS | 0 refills | Status: DC

## 2018-11-29 MED ORDER — LIDOCAINE 5 % EX PTCH
2.0000 | MEDICATED_PATCH | CUTANEOUS | Status: DC
  Administered 2018-11-29: 11:00:00 2 via TRANSDERMAL
  Filled 2018-11-29: qty 2

## 2018-11-29 NOTE — ED Notes (Signed)
Bed: WLPT4 Expected date:  Expected time:  Means of arrival:  Comments: 

## 2018-11-29 NOTE — ED Triage Notes (Signed)
Per EMS-chronic back pain-was seen in May for same-ran out of oxycodone yesterday-not due for a refill for another 8 days

## 2018-11-29 NOTE — ED Provider Notes (Signed)
[facility] DEPT Provider Note   CSN: [id number] Arrival date & time: 11/29/18  4665    History   Chief Complaint Chief Complaint  Patient presents with   Back Pain    HPI Howard Fox is a 58 y.o. male with history of anxiety, bipolar 1 disorder, hepatitis C, hypertension, nephrolithiasis, vertebral osteomyelitis presenting for evaluation of ongoing chronic low back pain.  He reports that he has been feeling his usual chronic aching low back pain with radiation to the left side but ran out of his oxycodone yesterday.  He was prescribed a one-month supply on 11/08/2018 and so he is out 9 days early.  Reports he has been taking 3 tablets daily instead of the prescribed 2.  Has also been taking his other home medications.   He is complaining of aching pain that worsens with bending.  Denies bowel or bladder incontinence, fevers, saddle anesthesia, or urinary symptoms.  Pain is consistent with his usual chronic back pains.  Dr. Alfonso Ramus his orthopedist has been managing his symptoms and is also recommending that he follow-up with a cardiothoracic surgeon related to rib fractures years ago.  He reports that he is planning on calling his orthopedist today to set up a follow-up appointment.  He reports that he is recovering well from recent hospital admission for osteomyelitis and is not currently on any antibiotics.     The history is provided by the patient.    Past Medical History:  Diagnosis Date   Anxiety 10/25/2018   Bipolar 1 disorder ([place])    Hepatitis C    Hypertension    Kidney stones    Vertebral osteomyelitis ([place]) 10/25/2018    Patient Active Problem List   Diagnosis Date Noted   Vertebral osteomyelitis ([place]) 10/25/2018   Anxiety 10/25/2018   Cocaine use 10/24/2018   Marijuana smoker 10/24/2018   Subacute osteomyelitis, other site [facility])    IVDU (intravenous drug user)    Back pain    MRSA bacteremia 10/09/2018   Diskitis  10/08/2018   Chest wall pain 08/31/2018   Prediabetes 06/26/2018   Hepatitis C, chronic ([place]) 04/01/2015   Tobacco use disorder 04/01/2015   Other abnormal glucose 12/10/2014   Obesity (BMI 30.0-34.9) 12/10/2014   Medication management 12/03/2013   Essential hypertension 04/25/2013   Hyperlipidemia, mixed 04/25/2013   Vitamin D deficiency 04/25/2013   Bipolar disorder ([place]) 02/19/2007    Past Surgical History:  Procedure Laterality Date   CHOLECYSTECTOMY          Home Medications    Prior to Admission medications   Medication Sig Start Date End Date Taking? Authorizing Provider  albuterol (PROVENTIL HFA;VENTOLIN HFA) 108 (90 Base) MCG/ACT inhaler INHALE 2 PUFFS BY MOUTH EVERY 4 HOURS AS NEEDED FOR WHEEZING AND FOR SHORTNESS OF BREATH 08/21/17   Unk Pinto, MD  amitriptyline (ELAVIL) 150 MG tablet Take 1 tablet at Bedtime for Sleep & Mood 09/23/18   Unk Pinto, MD  aspirin EC 325 MG tablet Take 325 mg by mouth daily.     [provider]  carisoprodol (SOMA) 350 MG tablet Take 350 mg by mouth every 6 (six) hours as needed for muscle spasms.     [provider]  Cholecalciferol (VITAMIN D) 50 MCG (2000 UT) tablet Take 8,000 Units by mouth daily.     [provider]  citalopram (CELEXA) 10 MG tablet Take 1 tablet (10 mg total) by mouth daily. Patient taking differently: Take 20 mg by mouth  daily.  10/16/18   Shelly Coss, MD  Cyanocobalamin (VITAMIN B-12) 1000 MCG SUBL Place 1,000 mcg under the tongue every other day.     [provider]  gabapentin (NEURONTIN) 300 MG capsule Take 1 capsule (300 mg total) by mouth 3 (three) times daily as needed. For pain. 08/31/18   Liane Comber, NP  ibuprofen (ADVIL,MOTRIN) 200 MG tablet Take 800 mg by mouth every 4 (four) hours as needed for moderate pain. Takes 6 tablets every four hours    [provider]  lidocaine (LIDODERM) 5 % Place 1 patch onto the skin daily. Remove &  Discard patch within 12 hours or as directed by MD 11/29/18   Rodell Perna A, PA-C  linezolid (ZYVOX) 600 MG tablet Take 1 tablet (600 mg total) by mouth every 12 (twelve) hours. 10/25/18   Truman Hayward, MD  lisinopril-hydrochlorothiazide (ZESTORETIC) 20-25 MG tablet Take 1 tablet Daily for BP 11/19/18   Unk Pinto, MD  Menthol-Camphor (ICY HOT ADVANCED RELIEF) 16-11 % CREA Apply 1 application topically 3 (three) times daily as needed (pain).    [provider]  oxyCODONE-acetaminophen (PERCOCET) 10-325 MG tablet ORDERED BY DIFFERENT PHYSICIAN, NOT DR VAN DAM 11/01/18   [provider]  polyethylene glycol (MIRALAX / GLYCOLAX) 17 g packet Take 17 g by mouth daily as needed. 10/15/18   Shelly Coss, MD  rosuvastatin (CRESTOR) 40 MG tablet Take 1/2 to 1 tablet daily or as directed for Cholesterol Patient taking differently: Take 40 mg by mouth daily.  09/01/17   Unk Pinto, MD  Sofosbuvir-Velpatasvir (EPCLUSA) 400-100 MG TABS Take 1 tablet by mouth daily. 11/06/18   Truman Hayward, MD    Family History Family History  Problem Relation Age of Onset   Mental illness Mother        Bipolar   Stroke Father    Arthritis Father     Social History Social History   Tobacco Use   Smoking status: Current Every Day Smoker    Packs/day: 0.50    Years: 42.00    Pack years: 21.00    Types: Cigarettes    Start date: 01/02/1977   Smokeless tobacco: Never Used  Substance Use Topics   Alcohol use: Yes    Alcohol/week: 2.0 standard drinks    Types: 2 Cans of beer per week   Drug use: Yes    Types: Marijuana     Allergies   Codeine and Dilaudid [hydromorphone hcl]   Review of Systems Review of Systems  Constitutional: Negative for chills and fever.  Respiratory: Negative for shortness of breath.   Cardiovascular: Negative for chest pain.  Genitourinary: Negative for difficulty urinating, dysuria, hematuria and urgency.  Musculoskeletal: Positive  for back pain.  All other systems reviewed and are negative.    Physical Exam Updated Vital Signs BP 122/80    Pulse 94    Temp 98 F (36.7 C) (Oral)    Resp 16    SpO2 100%   Physical Exam Vitals signs and nursing note reviewed.  Constitutional:      General: He is not in acute distress.    Appearance: He is well-developed.  HENT:     Head: Normocephalic and atraumatic.  Eyes:     General:        Right eye: No discharge.        Left eye: No discharge.     Conjunctiva/sclera: Conjunctivae normal.  Neck:     Musculoskeletal: Normal range of  motion and neck supple.     Vascular: No JVD.     Trachea: No tracheal deviation.  Cardiovascular:     Rate and Rhythm: Normal rate and regular rhythm.     Pulses: Normal pulses.     Heart sounds: Normal heart sounds.     Comments: 2+ radial and DP/PT pulses bilaterally, no lower extremity edema, Homans sign absent bilaterally Pulmonary:     Effort: Pulmonary effort is normal.     Breath sounds: Normal breath sounds.  Abdominal:     General: Bowel sounds are normal. There is no distension.     Palpations: Abdomen is soft.     Tenderness: There is no abdominal tenderness. There is no guarding or rebound.  Musculoskeletal:     Comments: Diffuse lumbar spine tenderness with the left paralumbar muscle tenderness.  No focal tenderness.  No deformity, crepitus, or step-off.  5/5 strength of BLE major muscle groups.  Skin:    General: Skin is warm and dry.     Findings: No erythema.  Neurological:     Mental Status: He is alert.     Comments: Fluent speech, no facial droop, sensation intact to soft touch of bilateral lower extremities.  Patient ambulatory with an antalgic gait, leaning forward, but steady gait and balance.  Psychiatric:        Behavior: Behavior normal.      ED Treatments / Results  Labs (all labs ordered are listed, but only abnormal results are displayed) Labs Reviewed - No data to  display  EKG None  Radiology No results found.  Procedures Procedures (including critical care time)  Medications Ordered in ED Medications  ketorolac (TORADOL) 30 MG/ML injection 30 mg (has no administration in time range)  lidocaine (LIDODERM) 5 % 2 patch (has no administration in time range)     Initial Impression / Assessment and Plan / ED Course  I have reviewed the triage vital signs and the nursing notes.  Pertinent labs & imaging results that were available during my care of the patient were reviewed by me and considered in my medical decision making (see chart for details).        Patient presenting for evaluation of his chronic back pains.  He is afebrile, vital signs are stable and he is nontoxic in appearance.  Pain is reproducible on palpation.  He has no focal tenderness and reports the pain is consistent with his usual back pain, no worse than baseline but he is here because he ran out of his oxycodone early.  I explained to him that I could not prescribe refills for chronic narcotic medications but he was amenable to nonnarcotic options.  He is ambulatory without difficulty, no red flag signs concerning for cauda equina or spinal abscess.  Doubt recurrence of discitis/osteomyelitis given normal vital signs and nonfocal examination.  He is in the process of establishing care with a pain management physician and also wants to reach out to his orthopedist for reevaluation which I think is reasonable.  Discussed strict ED return precautions. Patient verbalized understanding of and agreement with plan and is safe for discharge home at this time.   Final Clinical Impressions(s) / ED Diagnoses   Final diagnoses:  Chronic left-sided low back pain without sciatica    ED Discharge Orders         Ordered    lidocaine (LIDODERM) 5 %  Every 24 hours     11/29/18 1036  Renita Papa, PA-C 11/29/18 1042    Daleen Bo, MD 11/29/18 1840

## 2018-11-29 NOTE — Discharge Instructions (Signed)
Continue taking your home medications as prescribed.  I have prescribed lidocaine patches which you can apply to areas of pain.  I have also attached resources for follow-up with pain management specialist in our area.  Call Dr. Alfonso Ramus and your cardiothoracic surgeon to schedule follow-up and for refills of medications.  Return to the emergency department if any concerning signs or symptoms develop.

## 2018-12-05 ENCOUNTER — Other Ambulatory Visit: Payer: Self-pay | Admitting: Adult Health

## 2018-12-05 DIAGNOSIS — R0789 Other chest pain: Secondary | ICD-10-CM

## 2018-12-05 MED FILL — SOFOSBUVIR-VELPATASVIR 400-: 400-100 | 28 days supply | Qty: 28 | Fill #1

## 2018-12-06 ENCOUNTER — Ambulatory Visit: Payer: Medicare Other | Admitting: Pharmacist

## 2018-12-06 DIAGNOSIS — Z6831 Body mass index (BMI) 31.0-31.9, adult: Secondary | ICD-10-CM | POA: Diagnosis not present

## 2018-12-06 DIAGNOSIS — M4644 Discitis, unspecified, thoracic region: Secondary | ICD-10-CM | POA: Diagnosis not present

## 2018-12-06 DIAGNOSIS — M5441 Lumbago with sciatica, right side: Secondary | ICD-10-CM | POA: Diagnosis not present

## 2018-12-10 ENCOUNTER — Encounter: Payer: Self-pay | Admitting: Internal Medicine

## 2018-12-11 ENCOUNTER — Telehealth: Payer: Self-pay | Admitting: *Deleted

## 2018-12-11 NOTE — Telephone Encounter (Signed)
Patient called to report that he is having spinal surgery in two weeks and will have to stop his Epclusa until further notice. He advised the surgery is very necessary as he has constant pain and can no longer walk. He states he will call back to restart treatment once his is released from Psychologist, sport and exercise. Advised him will let the provider know and wished him luck.

## 2018-12-12 NOTE — Telephone Encounter (Signed)
Per Howard Fox advised the patient to continue his Howard Fox and if the surgeon has questions he can call the office. He advised he understands and will let them know.

## 2018-12-12 NOTE — Telephone Encounter (Signed)
He CANNOT stop his Howard Fox that will completely MESS up his HEp C  There is NO good reason to stop it  Can you please ask him to continue it and have his surgeon call us  I think he is misunderstanding some non specific instructions from the surgeons like stop all of your meds  NO he should NOT stop them

## 2018-12-12 NOTE — Telephone Encounter (Signed)
Thanks Travis 

## 2018-12-13 ENCOUNTER — Encounter: Payer: Self-pay | Admitting: Pharmacy Technician

## 2018-12-13 MED FILL — SOFOSBUVIR-VELPATASVIR 400-: 400-100 | 28 days supply | Qty: 28 | Fill #1

## 2018-12-14 DIAGNOSIS — M5441 Lumbago with sciatica, right side: Secondary | ICD-10-CM | POA: Diagnosis not present

## 2018-12-18 DIAGNOSIS — M5441 Lumbago with sciatica, right side: Secondary | ICD-10-CM | POA: Diagnosis not present

## 2018-12-18 DIAGNOSIS — M545 Low back pain: Secondary | ICD-10-CM | POA: Diagnosis not present

## 2018-12-26 DIAGNOSIS — M4624 Osteomyelitis of vertebra, thoracic region: Secondary | ICD-10-CM | POA: Diagnosis not present

## 2018-12-26 DIAGNOSIS — M4644 Discitis, unspecified, thoracic region: Secondary | ICD-10-CM | POA: Diagnosis not present

## 2019-01-01 ENCOUNTER — Other Ambulatory Visit: Payer: Self-pay | Admitting: Neurological Surgery

## 2019-01-01 DIAGNOSIS — Z6831 Body mass index (BMI) 31.0-31.9, adult: Secondary | ICD-10-CM | POA: Diagnosis not present

## 2019-01-01 DIAGNOSIS — M4644 Discitis, unspecified, thoracic region: Secondary | ICD-10-CM | POA: Diagnosis not present

## 2019-01-02 ENCOUNTER — Other Ambulatory Visit: Payer: Self-pay

## 2019-01-02 ENCOUNTER — Telehealth: Payer: Self-pay | Admitting: Infectious Disease

## 2019-01-02 ENCOUNTER — Telehealth: Payer: Self-pay | Admitting: Pharmacist

## 2019-01-02 ENCOUNTER — Other Ambulatory Visit: Payer: Medicare Other

## 2019-01-02 DIAGNOSIS — B9562 Methicillin resistant Staphylococcus aureus infection as the cause of diseases classified elsewhere: Secondary | ICD-10-CM

## 2019-01-02 DIAGNOSIS — Z79899 Other long term (current) drug therapy: Secondary | ICD-10-CM | POA: Diagnosis not present

## 2019-01-02 DIAGNOSIS — M810 Age-related osteoporosis without current pathological fracture: Secondary | ICD-10-CM

## 2019-01-02 DIAGNOSIS — M818 Other osteoporosis without current pathological fracture: Secondary | ICD-10-CM | POA: Diagnosis not present

## 2019-01-02 DIAGNOSIS — B182 Chronic viral hepatitis C: Secondary | ICD-10-CM

## 2019-01-02 DIAGNOSIS — R7881 Bacteremia: Secondary | ICD-10-CM

## 2019-01-02 NOTE — Telephone Encounter (Signed)
His surgeon Dr. Ronnald Ramp told me he had stopped the Hep c meds and he didn't seem to think it was of great consequence.   I expect pt will fail with resistance and I now regret trying to treat him when he clearly lacks the insight to even adhere to a very simple regimen

## 2019-01-02 NOTE — Telephone Encounter (Signed)
Pt with progression of infeciton in spine and going for Neurosurgery in [facility] September  He needs to stop his doxycyline to allow [place] to increase yield on intraop cultures

## 2019-01-02 NOTE — Telephone Encounter (Signed)
Patient called today about his Epclusa.  He states that he took 3 weeks of it and then stopped for a week and a half.  He states that he was on so many pain medications and taking so many ibuprofen that he couldn't take it.  He also states that he drank a "whole bottle" of pepto at least 2-3 times while taking the Epclusa, which you are not supposed to take any antacids with Epclusa.  I voiced my concern for his cure of Hepatitis C and told him that if he missed this many doses then he may not be cured (highly likely). He stated that he was going to continue taking it no matter what. He is coming in today to get a Hep C viral load drawn. He wanted to know when he was to see Dr. Tommy Medal again.  He is having spinal surgery on 9/9.

## 2019-01-03 NOTE — Telephone Encounter (Signed)
He should go ahead and stop now sicce it is already almost 2 weeks until his back surgery

## 2019-01-03 NOTE — Telephone Encounter (Signed)
Spoke with Remo Lipps. He states he is not taking doxy, that he finished the "high powered antibiotic a month ago" but continued the epclusa.

## 2019-01-03 NOTE — Telephone Encounter (Signed)
When should he stop and for how long?

## 2019-01-04 NOTE — Telephone Encounter (Signed)
I hope he really did not ever have a lag in his [place]

## 2019-01-07 NOTE — Progress Notes (Deleted)
MEDICARE ANNUAL WELLNESS VISIT AND FOLLOW UP Assessment:    Encounter for annual medicare wellness visit  Essential hypertension - continue medications, DASH diet, exercise and monitor at home. Call if greater than 130/80.  - CBC with Differential/Platelet - CMP/GFR - TSH   Abnormal glucose Discussed general issues about diabetes pathophysiology and management., Educational material distributed., Suggested low cholesterol diet., Encouraged aerobic exercise., Discussed foot care., Reminded to get yearly retinal exam. - Hemoglobin A1c  Obesity Obesity with co morbidities- long discussion about weight loss, diet, and exercise   Mixed hyperlipidemia -continue medications, check lipids, decrease fatty foods, increase activity.  - Lipid panel  Vitamin D deficiency - Vit D  25 hydroxy (rtn osteoporosis monitoring)  Medication management - Magnesium   Bipolar disorder in full remission, most recent episode unspecified type ([place]) Controlled on medications   Chronic hepatitis C without hepatic coma ([place]) Followed by hep C clinic; Doing epclusa *** monitor LFTs  Tobacco use disorder Smoking cessation-  instruction/counseling given, counseled patient on the dangers of tobacco use, advised patient to stop smoking, and reviewed strategies to maximize success, patient not ready to quit at this time.   IVDU/cocain/marijuana Advised to stop; no controlled substances will be prescribed via our office   Discitis/subacute osteomyelitis/ MRSA bacterimia Check CBC; followed by ID Dr. Tommy Medal - follow up as recommended  Chronic back pain Followed by Dr. Ronnald Ramp  Over 30 minutes of exam, counseling, chart review, and critical decision making was performed Future Appointments  Date Time Provider [place]  01/09/2019 11:15 AM Liane Comber, NP [facility] None  10/30/2019  3:00 PM Unk Pinto, MD [facility] None    Plan:   During the course of the visit the patient was  educated and counseled about appropriate screening and preventive services including:    Pneumococcal vaccine   Influenza vaccine  Prevnar 13  Td vaccine  Screening electrocardiogram  Colorectal cancer screening  Diabetes screening  Glaucoma screening  Nutrition counseling   Subjective:  Howard Fox is a 58 y.o. male who presents for Medicare Annual Wellness Visit and 3 month follow up for HTN, hyperlipidemia, prediabetes, and vitamin D Def.   Has been on disability since 2008 for severe back injury. Recently complicated by admission in May 2020 resulting from subacute vertebral osteomyelitis and MRSA bacteremia and has been followed since discharge by Dr. Tommy Medal with ID, on zyvox then doxycycline ***  He has upcomign laminectomy/foraminectomy scheduled with Dr. Sherley Bounds (neurosurgery). ***  Patient was treated for Hepatitis C in 2008-2009. He is currently undergoing epclusa treatment with hep C clinic/ID but admittedly has been poorly complaint and anticipated poor outcome per Dr. Tommy Medal ***  he currently continues to smoke 2-6 cigarettes a day (down from 10-12); discussed risks associated with smoking, patient is not ready to quit.   Has history of bipolar/mood DO, on celexa, amitriptylene and xanax, denies any bipolar manic depression episodes. Off of xanax due to drug use and opioids.   BMI is There is no height or weight on file to calculate BMI., he has not been working on diet and exercise. Wt Readings from Last 3 Encounters:  11/06/18 232 lb (105.2 kg)  10/25/18 229 lb (103.9 kg)  10/24/18 231 lb 9.6 oz (105.1 kg)   His blood pressure has been controlled at home, runs 120-130/70's at home, today their BP is    He does workout, does yard work. He denies chest pain, shortness of breath, dizziness.   ***  just had checked *** He is on cholesterol medication (rosuvastatin 40 mg daily ***) and denies myalgias. His cholesterol is not at goal. The cholesterol last  visit was:   Lab Results  Component Value Date   CHOL 171 01/02/2019   HDL 40 01/02/2019   LDLCALC 111 (H) 01/02/2019   TRIG 101 01/02/2019   CHOLHDL 4.3 01/02/2019   He has been working on diet and exercise for glucose management, and denies paresthesia of the feet, polydipsia, polyuria and visual disturbances. Last A1C in the office was:  Lab Results  Component Value Date   HGBA1C 5.6 10/25/2018   Patient is on Vitamin D supplement.   Lab Results  Component Value Date   VD25OH 48 10/25/2018    Treated for Hep c in 2008, following with ID *** on epclusa   Lab Results  Component Value Date   ALT 21 11/06/2018   ALT 20 11/06/2018   AST 28 11/06/2018   ALKPHOS 87 10/14/2018   BILITOT 0.3 11/06/2018    Medication Review: Current Outpatient Medications on File Prior to Visit  Medication Sig Dispense Refill  . albuterol (PROVENTIL HFA;VENTOLIN HFA) 108 (90 Base) MCG/ACT inhaler INHALE 2 PUFFS BY MOUTH EVERY 4 HOURS AS NEEDED FOR WHEEZING AND FOR SHORTNESS OF BREATH 18 each 1  . amitriptyline (ELAVIL) 150 MG tablet Take 1 tablet at Bedtime for Sleep & Mood 90 tablet 1  . aspirin EC 325 MG tablet Take 325 mg by mouth daily.     . carisoprodol (SOMA) 350 MG tablet Take 350 mg by mouth every 6 (six) hours as needed for muscle spasms.     . Cholecalciferol (VITAMIN D) 50 MCG (2000 UT) tablet Take 8,000 Units by mouth daily.     . citalopram (CELEXA) 10 MG tablet Take 1 tablet (10 mg total) by mouth daily. (Patient taking differently: Take 20 mg by mouth daily. ) 30 tablet 0  . Cyanocobalamin (VITAMIN B-12) 1000 MCG SUBL Place 1,000 mcg under the tongue every other day.     . gabapentin (NEURONTIN) 300 MG capsule TAKE 1 CAPSULE BY MOUTH THREE TIMES DAILY AS NEEDED FOR PAIN 90 capsule 1  . ibuprofen (ADVIL,MOTRIN) 200 MG tablet Take 800 mg by mouth every 4 (four) hours as needed for moderate pain. Takes 6 tablets every four hours    . lidocaine (LIDODERM) 5 % Place 1 patch onto the skin  daily. Remove & Discard patch within 12 hours or as directed by MD 30 patch 0  . linezolid (ZYVOX) 600 MG tablet Take 1 tablet (600 mg total) by mouth every 12 (twelve) hours. 24 tablet 0  . lisinopril-hydrochlorothiazide (ZESTORETIC) 20-25 MG tablet Take 1 tablet Daily for BP 90 tablet 1  . Menthol-Camphor (ICY HOT ADVANCED RELIEF) 16-11 % CREA Apply 1 application topically 3 (three) times daily as needed (pain).    Marland Kitchen oxyCODONE-acetaminophen (PERCOCET) 10-325 MG tablet ORDERED BY DIFFERENT PHYSICIAN, NOT DR VAN DAM    . polyethylene glycol (MIRALAX / GLYCOLAX) 17 g packet Take 17 g by mouth daily as needed. 14 each 0  . rosuvastatin (CRESTOR) 40 MG tablet Take 1/2 to 1 tablet daily or as directed for Cholesterol (Patient taking differently: Take 40 mg by mouth daily. ) 90 tablet 1  . Sofosbuvir-Velpatasvir (EPCLUSA) 400-100 MG TABS Take 1 tablet by mouth daily. 28 tablet 2   No current facility-administered medications on file prior to visit.     Current Problems (verified) Patient Active Problem List  Diagnosis Date Noted  . Vertebral osteomyelitis ([place]) 10/25/2018  . Anxiety 10/25/2018  . Cocaine use 10/24/2018  . Marijuana smoker 10/24/2018  . Subacute osteomyelitis, other site ([place])   . IVDU (intravenous drug user)   . Back pain   . MRSA bacteremia 10/09/2018  . Diskitis 10/08/2018  . Chest wall pain 08/31/2018  . Prediabetes 06/26/2018  . Hepatitis C, chronic ([place]) 04/01/2015  . Tobacco use disorder 04/01/2015  . Other abnormal glucose 12/10/2014  . Obesity (BMI 30.0-34.9) 12/10/2014  . Medication management 12/03/2013  . Essential hypertension 04/25/2013  . Hyperlipidemia, mixed 04/25/2013  . Vitamin D deficiency 04/25/2013  . Bipolar disorder ([place]) 02/19/2007    Screening Tests Immunization History  Administered Date(s) Administered  . DT 04/01/2015  . Td 05/23/2004    Preventative care: Last colonoscopy: never Cologard: 10/2016 Stress test 2006 neg CXR  11/2018 [place] AB 2010   Prior vaccinations: TD or Tdap: 2016  Influenza: declines Pneumococcal: N/A Prevnar13: N/A Shingles/Zostavax: N/A  Names of Other Physician/Practitioners you currently use: 1. [facility] here for primary care 2. [facility], Dr. Regis [facility] in [place] eye doctor, last visit 06/2017 3. Dentures,  Patient Care Team: Unk Pinto, MD as PCP - General (Internal Medicine) Nickie Retort, MD as Consulting Physician (Urology)  Allergies Allergies  Allergen Reactions  . Codeine Itching  . Dilaudid [Hydromorphone Hcl] Itching    SURGICAL HISTORY He  has a past surgical history that includes Cholecystectomy. FAMILY HISTORY His family history includes Arthritis in his father; Mental illness in his mother; Stroke in his father. SOCIAL HISTORY He  reports that he has been smoking cigarettes. He started smoking about 42 years ago. He has a 21.00 pack-year smoking history. He has never used smokeless tobacco. He reports current alcohol use of about 2.0 standard drinks of alcohol per week. He reports current drug use. Drug: Marijuana.  MEDICARE WELLNESS OBJECTIVES: Physical activity:   Cardiac risk factors:   Depression/mood screen:   Depression screen [facility] 2/9 07/01/2018  Decreased Interest 0  Down, Depressed, Hopeless 0  PHQ - 2 Score 0    ADLs:  In your present state of health, do you have any difficulty performing the following activities: 10/08/2018 07/01/2018  Hearing? N N  Vision? N N  Difficulty concentrating or making decisions? N N  Walking or climbing stairs? Y N  Dressing or bathing? Y N  Doing errands, shopping? N N  Some recent data might be hidden     Cognitive Testing  Alert? Yes  Normal Appearance?Yes  Oriented to person? Yes  Place? Yes   Time? Yes  Recall of three objects?  Yes  Can perform simple calculations? Yes  Displays appropriate judgment?Yes  Can read the correct time from a watch  face?Yes  EOL planning:     Objective:   There were no vitals filed for this visit. There is no height or weight on file to calculate BMI.  General appearance: alert, no distress, WD/WN, male HEENT: normocephalic, sclerae anicteric, TMs pearly, nares patent, no discharge or erythema, pharynx normal Oral cavity: MMM, no lesions Neck: supple, no lymphadenopathy, no thyromegaly, no masses Heart: RRR, normal S1, S2, no murmurs Lungs: Lung sounds present throughout bilaterally, with rhonchi, scattered rales, scattered faint wheezing. No stridor Abdomen: well healing vertical scar, left sided ventral hernia, obese, +bs, soft, non tender, non distended, no masses, no hepatomegaly, no splenomegaly Musculoskeletal: nontender, no swelling, no obvious deformity Extremities: no edema, no cyanosis,  no clubbing,  Pulses: 2+ symmetric, upper and lower extremities, normal cap refill Neurological: alert, oriented x 3, CN2-12 intact, strength normal upper extremities and lower extremities, sensation normal throughout, DTRs 2+ throughout, no cerebellar signs, gait antaglic Psychiatric: normal affect, behavior normal, pleasant   Medicare Attestation I have personally reviewed: The patient's medical and social history Their use of alcohol, tobacco or illicit drugs Their current medications and supplements The patient's functional ability including ADLs,fall risks, home safety risks, cognitive, and hearing and visual impairment Diet and physical activities Evidence for depression or mood disorders  The patient's weight, height, BMI, and visual acuity have been recorded in the chart.  I have made referrals, counseling, and provided education to the patient based on review of the above and I have provided the patient with a written personalized care plan for preventive services.     Izora Ribas, NP   01/07/2019

## 2019-01-09 ENCOUNTER — Ambulatory Visit: Payer: Self-pay | Admitting: Adult Health

## 2019-01-10 LAB — LIPID PANEL
Cholesterol: 171 mg/dL (ref <200)
HDL: 40 mg/dL (ref >=40)
LDL Cholesterol (Calc): 111 mg/dL (calc) — ABNORMAL HIGH
Non-HDL Cholesterol (Calc): 131 mg/dL (calc) — ABNORMAL HIGH (ref <130)
Total CHOL/HDL Ratio: 4.3 (calc) (ref <5.0)
Triglycerides: 101 mg/dL (ref <150)

## 2019-01-10 LAB — HEPATITIS C RNA QUANTITATIVE
HCV Quantitative Log: 2.72 Log IU/mL — ABNORMAL HIGH
HCV RNA, PCR, QN: 519 IU/mL — ABNORMAL HIGH

## 2019-01-24 MED FILL — SOFOSBUVIR-VELPATASVIR 400-: 400-100 | 28 days supply | Qty: 28 | Fill #2

## 2019-01-25 NOTE — Pre-Procedure Instructions (Signed)
Howard Fox  01/25/2019      [facility] [address] [place], [address] [place] [address] Phone: [phone] Fax: [phone]    Your procedure is scheduled on Sept. 11  Report to [facility] Entrance A at 9:10A.M.  Call this number if you have problems the morning of surgery:  [phone]   Remember:  Do not eat or drink after midnight.      Take these medicines the morning of surgery with A SIP OF WATER:              Albuterol inhaler if needed--bring to hospital             Carisoprodol (soma) if needed             Citalopram (celexa)              Gabapentin if needed              linezioid (zyvox)              Oxycodone if needed              epclusa                  7 days prior to surgery STOP taking any Aspirin (unless otherwise instructed by your surgeon), Aleve, Naproxen, Ibuprofen, Motrin, Advil, Goody's, BC's, all herbal medications, fish oil, and all vitamins.          Follow your surgeon's instructions on when to stop Aspirin.  If no instructions were given by your surgeon then you will need to call the office to get those instructions.                    Do not wear jewelry.  Do not wear lotions, powders, or perfumes, or deodorant.  Do not shave 48 hours prior to surgery.  Men may shave face and neck.  Do not bring valuables to the hospital.  [facility] is not responsible for any belongings or valuables.  Contacts, dentures or bridgework may not be worn into surgery.  Leave your suitcase in the car.  After surgery it may be brought to your room.  For patients admitted to the hospital, discharge time will be determined by your treatment team.  Patients discharged the day of surgery will not be allowed to drive home.    Special instructions:   [place]- Preparing For Surgery  Before surgery, you can play an important role. Because skin is not sterile, your skin needs to be as  free of germs as possible. You can reduce the number of germs on your skin by washing with CHG (chlorahexidine gluconate) Soap before surgery.  CHG is an antiseptic cleaner which kills germs and bonds with the skin to continue killing germs even after washing.    Oral Hygiene is also important to reduce your risk of infection.  Remember - BRUSH YOUR TEETH THE MORNING OF SURGERY WITH YOUR REGULAR TOOTHPASTE  Please do not use if you have an allergy to CHG or antibacterial soaps. If your skin becomes reddened/irritated stop using the CHG.  Do not shave (including legs and underarms) for at least 48 hours prior to first CHG shower. It is OK to shave your face.  Please follow these instructions carefully.   1. Shower the NIGHT BEFORE SURGERY and the MORNING OF SURGERY with CHG.   2. If you  chose to wash your hair, wash your hair first as usual with your normal shampoo.  3. After you shampoo, rinse your hair and body thoroughly to remove the shampoo.  4. Use CHG as you would any other liquid soap. You can apply CHG directly to the skin and wash gently with a scrungie or a clean washcloth.   5. Apply the CHG Soap to your body ONLY FROM THE NECK DOWN.  Do not use on open wounds or open sores. Avoid contact with your eyes, ears, mouth and genitals (private parts). Wash Face and genitals (private parts)  with your normal soap.  6. Wash thoroughly, paying special attention to the area where your surgery will be performed.  7. Thoroughly rinse your body with warm water from the neck down.  8. DO NOT shower/wash with your normal soap after using and rinsing off the CHG Soap.  9. Pat yourself dry with a CLEAN TOWEL.  10. Wear CLEAN PAJAMAS to bed the night before surgery, wear comfortable clothes the morning of surgery  11. Place CLEAN SHEETS on your bed the night of your first shower and DO NOT SLEEP WITH PETS.    Day of Surgery:  Do not apply any deodorants/lotions.  Please wear clean  clothes to the hospital/surgery center.   Remember to brush your teeth WITH YOUR REGULAR TOOTHPASTE.    Please read over the following fact sheets that you were given. Coughing and Deep Breathing and Surgical Site Infection Prevention

## 2019-01-29 ENCOUNTER — Encounter (HOSPITAL_COMMUNITY): Payer: Self-pay

## 2019-01-29 ENCOUNTER — Encounter (HOSPITAL_COMMUNITY)
Admission: RE | Admit: 2019-01-29 | Discharge: 2019-01-29 | Disposition: A | Discharge status: Home / Self Care | Payer: Medicare Other | Source: Ambulatory Visit | Attending: Neurological Surgery | Admitting: Neurological Surgery

## 2019-01-29 ENCOUNTER — Other Ambulatory Visit (HOSPITAL_COMMUNITY)
Admission: RE | Admit: 2019-01-29 | Discharge: 2019-01-29 | Disposition: A | Discharge status: Home / Self Care | Payer: Medicare Other | Source: Ambulatory Visit | Attending: Neurological Surgery | Admitting: Neurological Surgery

## 2019-01-29 ENCOUNTER — Other Ambulatory Visit: Payer: Self-pay

## 2019-01-29 DIAGNOSIS — Z01812 Encounter for preprocedural laboratory examination: Secondary | ICD-10-CM | POA: Insufficient documentation

## 2019-01-29 DIAGNOSIS — Z20828 Contact with and (suspected) exposure to other viral communicable diseases: Secondary | ICD-10-CM | POA: Insufficient documentation

## 2019-01-29 HISTORY — DX: Depression, unspecified: F32.A

## 2019-01-29 HISTORY — DX: Personal history of urinary calculi: Z87.442

## 2019-01-29 HISTORY — DX: Unspecified osteoarthritis, unspecified site: M19.90

## 2019-01-29 HISTORY — DX: Cardiac murmur, unspecified: R01.1

## 2019-01-29 LAB — CBC WITH DIFFERENTIAL/PLATELET
Abs Immature Granulocytes: 0.03 10*3/uL (ref 0.00–0.07)
Basophils Absolute: 0.1 10*3/uL (ref 0.0–0.1)
Basophils Relative: 1 %
Eosinophils Absolute: 0.1 10*3/uL (ref 0.0–0.5)
Eosinophils Relative: 2 %
HCT: 41.2 % (ref 39.0–52.0)
Hemoglobin: 13.3 g/dL (ref 13.0–17.0)
Immature Granulocytes: 0 %
Lymphocytes Relative: 21 %
Lymphs Abs: 1.6 10*3/uL (ref 0.7–4.0)
MCH: 28.9 pg (ref 26.0–34.0)
MCHC: 32.3 g/dL (ref 30.0–36.0)
MCV: 89.4 fL (ref 80.0–100.0)
Monocytes Absolute: 0.7 10*3/uL (ref 0.1–1.0)
Monocytes Relative: 9 %
Neutro Abs: 5.2 10*3/uL (ref 1.7–7.7)
Neutrophils Relative %: 67 %
Platelets: 379 10*3/uL (ref 150–400)
RBC: 4.61 MIL/uL (ref 4.22–5.81)
RDW: 13.7 % (ref 11.5–15.5)
WBC: 7.6 10*3/uL (ref 4.0–10.5)
nRBC: 0 % (ref 0.0–0.2)

## 2019-01-29 LAB — COMPREHENSIVE METABOLIC PANEL
ALT: 14 U/L (ref 0–44)
AST: 19 U/L (ref 15–41)
Albumin: 3 g/dL — ABNORMAL LOW (ref 3.5–5.0)
Alkaline Phosphatase: 80 U/L (ref 38–126)
Anion gap: 10 (ref 5–15)
BUN: 10 mg/dL (ref 6–20)
CO2: 25 mmol/L (ref 22–32)
Calcium: 9.4 mg/dL (ref 8.9–10.3)
Chloride: 100 mmol/L (ref 98–111)
Creatinine, Ser: 0.75 mg/dL (ref 0.61–1.24)
GFR calc Af Amer: >60 mL/min (ref >60)
GFR calc non Af Amer: >60 mL/min (ref >60)
Glucose, Bld: 98 mg/dL (ref 70–99)
Potassium: 4.6 mmol/L (ref 3.5–5.1)
Sodium: 135 mmol/L (ref 135–145)
Total Bilirubin: 0.6 mg/dL (ref 0.3–1.2)
Total Protein: 7.4 g/dL (ref 6.5–8.1)

## 2019-01-29 LAB — PROTIME-INR
INR: 1 (ref 0.8–1.2)
Prothrombin Time: 13.5 seconds (ref 11.4–15.2)

## 2019-01-29 LAB — TYPE AND SCREEN
ABO/RH(D): A POS
Antibody Screen: NEGATIVE

## 2019-01-29 LAB — ABO/RH: ABO/RH(D): A POS

## 2019-01-29 LAB — SURGICAL PCR SCREEN
MRSA, PCR: NEGATIVE
Staphylococcus aureus: NEGATIVE

## 2019-01-29 NOTE — Progress Notes (Signed)
PCP - Unk Pinto Cardiologist - na  Chest x-ray - 11-25-18 EKG - 10-07-18 Stress Test - 2006-states normal ECHO - 09-2018 Cardiac Cath - na  Sleep Study - na CPAP -   Fasting Blood Sugar -  na Checks Blood Sugar _____ times a day  Blood Thinner Instructions: Aspirin Instructions: last dose 01-28-19  Anesthesia review:  ekg  Patient denies shortness of breath, fever, cough and chest pain at PAT appointment   Patient verbalized understanding of instructions that were given to them at the PAT appointment. Patient was also instructed that they will need to review over the PAT instructions again at home before surgery.

## 2019-01-30 LAB — NOVEL CORONAVIRUS, NAA (HOSP ORDER, SEND-OUT TO REF LAB; TAT 18-24 HRS): SARS-CoV-2, NAA: NOT DETECTED

## 2019-01-30 NOTE — Anesthesia Preprocedure Evaluation (Addendum)
Anesthesia Evaluation  Patient identified by MRN, date of birth, ID band Patient awake    Reviewed: Allergy & Precautions, NPO status , Patient's Chart, lab work & pertinent test results  Airway Mallampati: II  TM Distance: >3 FB Neck ROM: Full    Dental no notable dental hx. (+) Edentulous Upper, Edentulous Lower   Pulmonary neg pulmonary ROS, Current Smoker,    Pulmonary exam normal breath sounds clear to auscultation       Cardiovascular hypertension, Pt. on medications negative cardio ROS Normal cardiovascular exam Rhythm:Regular Rate:Normal     Neuro/Psych Anxiety Depression Bipolar Disorder negative neurological ROS  negative psych ROS   GI/Hepatic negative GI ROS, (+) Hepatitis -, C  Endo/Other  negative endocrine ROS  Renal/GU negative Renal ROS  negative genitourinary   Musculoskeletal  (+) Arthritis , Osteoarthritis,    Abdominal (+) + obese,   Peds negative pediatric ROS (+)  Hematology negative hematology ROS (+)   Anesthesia Other Findings   Reproductive/Obstetrics negative OB ROS                           Anesthesia Physical Anesthesia Plan  ASA: III  Anesthesia Plan: General   Post-op Pain Management:    Induction: Intravenous  PONV Risk Score and Plan: 1 and Ondansetron and Treatment may vary due to age or medical condition  Airway Management Planned: Oral ETT  Additional Equipment:   Intra-op Plan:   Post-operative Plan: Extubation in OR  Informed Consent: I have reviewed the patients History and Physical, chart, labs and discussed the procedure including the risks, benefits and alternatives for the proposed anesthesia with the patient or authorized representative who has indicated his/her understanding and acceptance.     Dental advisory given  Plan Discussed with: CRNA  Anesthesia Plan Comments: (Currently being treated by Dr. Tommy Medal for vertebral  osteomyelitis/discitis and chronic Hep C (on Epclusa).   TTE 10/10/18:   1. The left ventricle has normal systolic function with an ejection fraction of 60-65%. The cavity size was normal. Left ventricular diastolic Doppler parameters are consistent with impaired relaxation.  2. The right ventricle has normal systolic function. The cavity was normal. There is no increase in right ventricular wall thickness.  3. No evidence of mitral valve stenosis.  4. The aortic valve is tricuspid. No stenosis of the aortic valve.)       Anesthesia Quick Evaluation

## 2019-02-01 ENCOUNTER — Inpatient Hospital Stay (HOSPITAL_COMMUNITY)
Admission: RE | Disposition: A | Discharge status: Home / Self Care | Payer: Self-pay | Source: Home / Self Care | Attending: Neurological Surgery

## 2019-02-01 ENCOUNTER — Inpatient Hospital Stay (HOSPITAL_COMMUNITY): Payer: Medicare Other | Admitting: Certified Registered Nurse Anesthetist

## 2019-02-01 ENCOUNTER — Inpatient Hospital Stay (HOSPITAL_COMMUNITY): Payer: Medicare Other | Admitting: Physician Assistant

## 2019-02-01 ENCOUNTER — Other Ambulatory Visit: Payer: Self-pay

## 2019-02-01 ENCOUNTER — Inpatient Hospital Stay (HOSPITAL_COMMUNITY): Payer: Medicare Other

## 2019-02-01 ENCOUNTER — Inpatient Hospital Stay (HOSPITAL_COMMUNITY)
Admission: RE | Admit: 2019-02-01 | Discharge: 2019-02-05 | DRG: 460 | Disposition: A | Discharge status: Home / Self Care | Payer: Medicare Other | Attending: Neurological Surgery | Admitting: Neurological Surgery

## 2019-02-01 ENCOUNTER — Encounter (HOSPITAL_COMMUNITY): Payer: Self-pay

## 2019-02-01 DIAGNOSIS — F319 Bipolar disorder, unspecified: Secondary | ICD-10-CM | POA: Diagnosis not present

## 2019-02-01 DIAGNOSIS — Z20828 Contact with and (suspected) exposure to other viral communicable diseases: Secondary | ICD-10-CM | POA: Diagnosis not present

## 2019-02-01 DIAGNOSIS — F17210 Nicotine dependence, cigarettes, uncomplicated: Secondary | ICD-10-CM | POA: Diagnosis present

## 2019-02-01 DIAGNOSIS — Z87442 Personal history of urinary calculi: Secondary | ICD-10-CM | POA: Diagnosis not present

## 2019-02-01 DIAGNOSIS — M4644 Discitis, unspecified, thoracic region: Secondary | ICD-10-CM | POA: Diagnosis not present

## 2019-02-01 DIAGNOSIS — Z981 Arthrodesis status: Secondary | ICD-10-CM

## 2019-02-01 DIAGNOSIS — Z79899 Other long term (current) drug therapy: Secondary | ICD-10-CM | POA: Diagnosis not present

## 2019-02-01 DIAGNOSIS — Z6830 Body mass index (BMI) 30.0-30.9, adult: Secondary | ICD-10-CM | POA: Diagnosis not present

## 2019-02-01 DIAGNOSIS — M40204 Unspecified kyphosis, thoracic region: Secondary | ICD-10-CM | POA: Diagnosis present

## 2019-02-01 DIAGNOSIS — B182 Chronic viral hepatitis C: Secondary | ICD-10-CM | POA: Diagnosis present

## 2019-02-01 DIAGNOSIS — Z7982 Long term (current) use of aspirin: Secondary | ICD-10-CM | POA: Diagnosis not present

## 2019-02-01 DIAGNOSIS — I10 Essential (primary) hypertension: Secondary | ICD-10-CM | POA: Diagnosis present

## 2019-02-01 DIAGNOSIS — M4804 Spinal stenosis, thoracic region: Secondary | ICD-10-CM | POA: Diagnosis present

## 2019-02-01 DIAGNOSIS — Z791 Long term (current) use of non-steroidal anti-inflammatories (NSAID): Secondary | ICD-10-CM | POA: Diagnosis not present

## 2019-02-01 DIAGNOSIS — F1990 Other psychoactive substance use, unspecified, uncomplicated: Secondary | ICD-10-CM | POA: Diagnosis not present

## 2019-02-01 DIAGNOSIS — M4014 Other secondary kyphosis, thoracic region: Secondary | ICD-10-CM | POA: Diagnosis not present

## 2019-02-01 DIAGNOSIS — G543 Thoracic root disorders, not elsewhere classified: Secondary | ICD-10-CM | POA: Diagnosis present

## 2019-02-01 DIAGNOSIS — Z885 Allergy status to narcotic agent status: Secondary | ICD-10-CM | POA: Diagnosis not present

## 2019-02-01 DIAGNOSIS — M4325 Fusion of spine, thoracolumbar region: Secondary | ICD-10-CM | POA: Diagnosis not present

## 2019-02-01 DIAGNOSIS — Z419 Encounter for procedure for purposes other than remedying health state, unspecified: Secondary | ICD-10-CM

## 2019-02-01 DIAGNOSIS — Z8614 Personal history of Methicillin resistant Staphylococcus aureus infection: Secondary | ICD-10-CM | POA: Diagnosis not present

## 2019-02-01 DIAGNOSIS — M4640 Discitis, unspecified, site unspecified: Secondary | ICD-10-CM | POA: Diagnosis not present

## 2019-02-01 DIAGNOSIS — M1990 Unspecified osteoarthritis, unspecified site: Secondary | ICD-10-CM | POA: Diagnosis not present

## 2019-02-01 DIAGNOSIS — E669 Obesity, unspecified: Secondary | ICD-10-CM | POA: Diagnosis not present

## 2019-02-01 DIAGNOSIS — Z91013 Allergy to seafood: Secondary | ICD-10-CM | POA: Diagnosis not present

## 2019-02-01 DIAGNOSIS — M545 Low back pain: Secondary | ICD-10-CM | POA: Diagnosis not present

## 2019-02-01 DIAGNOSIS — G992 Myelopathy in diseases classified elsewhere: Secondary | ICD-10-CM | POA: Diagnosis not present

## 2019-02-01 DIAGNOSIS — Z8739 Personal history of other diseases of the musculoskeletal system and connective tissue: Secondary | ICD-10-CM | POA: Diagnosis not present

## 2019-02-01 DIAGNOSIS — M4620 Osteomyelitis of vertebra, site unspecified: Secondary | ICD-10-CM | POA: Diagnosis not present

## 2019-02-01 HISTORY — PX: LAMINECTOMY WITH POSTERIOR LATERAL ARTHRODESIS LEVEL 4: SHX6338

## 2019-02-01 HISTORY — DX: Arthrodesis status: Z98.1

## 2019-02-01 SURGERY — LAMINECTOMY WITH POSTERIOR LATERAL ARTHRODESIS LEVEL 4
Anesthesia: General | Site: Back

## 2019-02-01 MED ORDER — OXYCODONE HCL 5 MG PO TABS
5.0000 mg | ORAL_TABLET | ORAL | Status: DC | PRN
  Administered 2019-02-02 – 2019-02-05 (×14): 5 mg via ORAL
  Filled 2019-02-01 (×14): qty 1

## 2019-02-01 MED ORDER — FENTANYL CITRATE (PF) 100 MCG/2ML IJ SOLN
INTRAMUSCULAR | Status: AC
  Filled 2019-02-01: qty 2

## 2019-02-01 MED ORDER — SODIUM CHLORIDE 0.9 % IV SOLN
250.0000 mL | INTRAVENOUS | Status: DC
  Administered 2019-02-01 – 2019-02-05 (×2): 250 mL via INTRAVENOUS

## 2019-02-01 MED ORDER — PROMETHAZINE HCL 25 MG/ML IJ SOLN: 6.2500 mg | INTRAMUSCULAR | Status: DC | PRN

## 2019-02-01 MED ORDER — THROMBIN 5000 UNITS EX SOLR
OROMUCOSAL | Status: DC | PRN
  Administered 2019-02-01 (×2): 5 mL via TOPICAL

## 2019-02-01 MED ORDER — ALBUTEROL SULFATE (2.5 MG/3ML) 0.083% IN NEBU: 2.5000 mg | INHALATION_SOLUTION | RESPIRATORY_TRACT | Status: DC | PRN

## 2019-02-01 MED ORDER — ROCURONIUM BROMIDE 10 MG/ML (PF) SYRINGE
PREFILLED_SYRINGE | INTRAVENOUS | Status: DC | PRN
  Administered 2019-02-01 (×3): 20 mg via INTRAVENOUS
  Administered 2019-02-01: 10 mg via INTRAVENOUS
  Administered 2019-02-01: 50 mg via INTRAVENOUS
  Administered 2019-02-01: 30 mg via INTRAVENOUS

## 2019-02-01 MED ORDER — MIDAZOLAM HCL 2 MG/2ML IJ SOLN
INTRAMUSCULAR | Status: AC
  Filled 2019-02-01: qty 2

## 2019-02-01 MED ORDER — OXYCODONE HCL 5 MG/5ML PO SOLN: 5.0000 mg | Freq: Once | ORAL | Status: AC | PRN

## 2019-02-01 MED ORDER — OXYCODONE HCL 5 MG PO TABS
5.0000 mg | ORAL_TABLET | Freq: Once | ORAL | Status: AC | PRN
  Administered 2019-02-01: 5 mg via ORAL

## 2019-02-01 MED ORDER — SOFOSBUVIR-VELPATASVIR 400-100 MG PO TABS
1.0000 | ORAL_TABLET | Freq: Every day | ORAL | Status: DC
  Administered 2019-02-02 – 2019-02-04 (×3): 1 via ORAL
  Filled 2019-02-01 (×4): qty 1

## 2019-02-01 MED ORDER — PROPOFOL 10 MG/ML IV BOLUS
INTRAVENOUS | Status: AC
  Filled 2019-02-01: qty 20

## 2019-02-01 MED ORDER — ONDANSETRON HCL 4 MG/2ML IJ SOLN
INTRAMUSCULAR | Status: AC
  Filled 2019-02-01: qty 2

## 2019-02-01 MED ORDER — CARISOPRODOL 350 MG PO TABS
350.0000 mg | ORAL_TABLET | Freq: Four times a day (QID) | ORAL | Status: DC | PRN
  Administered 2019-02-01 – 2019-02-05 (×4): 350 mg via ORAL
  Filled 2019-02-01 (×4): qty 1

## 2019-02-01 MED ORDER — SODIUM CHLORIDE 0.9% FLUSH
3.0000 mL | Freq: Two times a day (BID) | INTRAVENOUS | Status: DC
  Administered 2019-02-01 – 2019-02-04 (×7): 3 mL via INTRAVENOUS

## 2019-02-01 MED ORDER — FENTANYL CITRATE (PF) 250 MCG/5ML IJ SOLN
INTRAMUSCULAR | Status: AC
  Filled 2019-02-01: qty 5

## 2019-02-01 MED ORDER — LIDOCAINE 2% (20 MG/ML) 5 ML SYRINGE
INTRAMUSCULAR | Status: DC | PRN
  Administered 2019-02-01: 100 mg via INTRAVENOUS

## 2019-02-01 MED ORDER — CHLORHEXIDINE GLUCONATE CLOTH 2 % EX PADS: 6.0000 | MEDICATED_PAD | Freq: Once | CUTANEOUS | Status: DC

## 2019-02-01 MED ORDER — TRANEXAMIC ACID-NACL 1000-0.7 MG/100ML-% IV SOLN
INTRAVENOUS | Status: AC
  Filled 2019-02-01: qty 100

## 2019-02-01 MED ORDER — BUPIVACAINE HCL (PF) 0.25 % IJ SOLN
INTRAMUSCULAR | Status: AC
  Filled 2019-02-01: qty 30

## 2019-02-01 MED ORDER — ALBUTEROL SULFATE HFA 108 (90 BASE) MCG/ACT IN AERS: 2.0000 | INHALATION_SPRAY | RESPIRATORY_TRACT | Status: DC | PRN

## 2019-02-01 MED ORDER — OXYCODONE-ACETAMINOPHEN 10-325 MG PO TABS: 1.0000 | ORAL_TABLET | ORAL | Status: DC | PRN

## 2019-02-01 MED ORDER — CEFAZOLIN SODIUM-DEXTROSE 2-4 GM/100ML-% IV SOLN
2.0000 g | INTRAVENOUS | Status: AC
  Administered 2019-02-01: 12:00:00 2 g via INTRAVENOUS

## 2019-02-01 MED ORDER — SENNA 8.6 MG PO TABS
1.0000 | ORAL_TABLET | Freq: Two times a day (BID) | ORAL | Status: DC
  Administered 2019-02-01 – 2019-02-05 (×5): 8.6 mg via ORAL
  Filled 2019-02-01 (×7): qty 1

## 2019-02-01 MED ORDER — ONDANSETRON HCL 4 MG/2ML IJ SOLN
4.0000 mg | Freq: Four times a day (QID) | INTRAMUSCULAR | Status: DC | PRN
  Administered 2019-02-02: 4 mg via INTRAVENOUS
  Filled 2019-02-01: qty 2

## 2019-02-01 MED ORDER — PROPOFOL 10 MG/ML IV BOLUS
INTRAVENOUS | Status: DC | PRN
  Administered 2019-02-01: 150 mg via INTRAVENOUS

## 2019-02-01 MED ORDER — CELECOXIB 200 MG PO CAPS
200.0000 mg | ORAL_CAPSULE | Freq: Two times a day (BID) | ORAL | Status: DC
  Administered 2019-02-01 – 2019-02-04 (×5): 200 mg via ORAL
  Filled 2019-02-01 (×5): qty 1

## 2019-02-01 MED ORDER — CEFAZOLIN SODIUM-DEXTROSE 1-4 GM/50ML-% IV SOLN
1.0000 g | Freq: Three times a day (TID) | INTRAVENOUS | Status: DC
  Administered 2019-02-01 – 2019-02-05 (×11): 1 g via INTRAVENOUS
  Filled 2019-02-01 (×13): qty 50

## 2019-02-01 MED ORDER — ASPIRIN EC 325 MG PO TBEC
325.0000 mg | DELAYED_RELEASE_TABLET | Freq: Every day | ORAL | Status: DC
  Administered 2019-02-01 – 2019-02-05 (×4): 325 mg via ORAL
  Filled 2019-02-01 (×4): qty 1

## 2019-02-01 MED ORDER — ONDANSETRON HCL 4 MG PO TABS: 4.0000 mg | ORAL_TABLET | Freq: Four times a day (QID) | ORAL | Status: DC | PRN

## 2019-02-01 MED ORDER — THROMBIN 5000 UNITS EX SOLR
CUTANEOUS | Status: AC
  Filled 2019-02-01: qty 5000

## 2019-02-01 MED ORDER — VANCOMYCIN HCL 1000 MG IV SOLR
INTRAVENOUS | Status: DC | PRN
  Administered 2019-02-01: 16:00:00 1000 mg via INTRAVENOUS

## 2019-02-01 MED ORDER — POTASSIUM CHLORIDE IN NACL 20-0.9 MEQ/L-% IV SOLN
INTRAVENOUS | Status: DC
  Administered 2019-02-01 – 2019-02-03 (×3): via INTRAVENOUS
  Filled 2019-02-01 (×3): qty 1000

## 2019-02-01 MED ORDER — POLYETHYLENE GLYCOL 3350 17 G PO PACK
17.0000 g | PACK | Freq: Every day | ORAL | Status: DC | PRN
  Administered 2019-02-04: 17 g via ORAL
  Filled 2019-02-01: qty 1

## 2019-02-01 MED ORDER — LISINOPRIL-HYDROCHLOROTHIAZIDE 20-25 MG PO TABS: 0.5000 | ORAL_TABLET | Freq: Every day | ORAL | Status: DC

## 2019-02-01 MED ORDER — SODIUM CHLORIDE 0.9 % IV SOLN
INTRAVENOUS | Status: DC | PRN
  Administered 2019-02-01: 12:00:00 500 mL

## 2019-02-01 MED ORDER — THROMBIN 20000 UNITS EX SOLR
CUTANEOUS | Status: AC
  Filled 2019-02-01: qty 20000

## 2019-02-01 MED ORDER — ROCURONIUM BROMIDE 10 MG/ML (PF) SYRINGE
PREFILLED_SYRINGE | INTRAVENOUS | Status: AC
  Filled 2019-02-01: qty 10

## 2019-02-01 MED ORDER — ONDANSETRON HCL 4 MG/2ML IJ SOLN
INTRAMUSCULAR | Status: DC | PRN
  Administered 2019-02-01: 4 mg via INTRAVENOUS

## 2019-02-01 MED ORDER — DEXAMETHASONE SODIUM PHOSPHATE 10 MG/ML IJ SOLN
INTRAMUSCULAR | Status: AC
  Filled 2019-02-01: qty 1

## 2019-02-01 MED ORDER — SODIUM CHLORIDE 0.9 % IV SOLN
INTRAVENOUS | Status: DC | PRN
  Administered 2019-02-01: 25 ug/min via INTRAVENOUS

## 2019-02-01 MED ORDER — LIDOCAINE 2% (20 MG/ML) 5 ML SYRINGE
INTRAMUSCULAR | Status: AC
  Filled 2019-02-01: qty 5

## 2019-02-01 MED ORDER — BUPIVACAINE HCL (PF) 0.25 % IJ SOLN
INTRAMUSCULAR | Status: DC | PRN
  Administered 2019-02-01: 10 mL

## 2019-02-01 MED ORDER — HYDROCHLOROTHIAZIDE 12.5 MG PO CAPS
12.5000 mg | ORAL_CAPSULE | Freq: Every day | ORAL | Status: DC
  Administered 2019-02-02 – 2019-02-05 (×4): 12.5 mg via ORAL
  Filled 2019-02-01 (×4): qty 1

## 2019-02-01 MED ORDER — MEPERIDINE HCL 25 MG/ML IJ SOLN: 6.2500 mg | INTRAMUSCULAR | Status: DC | PRN

## 2019-02-01 MED ORDER — DEXAMETHASONE SODIUM PHOSPHATE 10 MG/ML IJ SOLN
10.0000 mg | Freq: Once | INTRAMUSCULAR | Status: DC
  Filled 2019-02-01: qty 1

## 2019-02-01 MED ORDER — FENTANYL CITRATE (PF) 100 MCG/2ML IJ SOLN
25.0000 ug | INTRAMUSCULAR | Status: DC | PRN
  Administered 2019-02-01: 50 ug via INTRAVENOUS

## 2019-02-01 MED ORDER — CITALOPRAM HYDROBROMIDE 20 MG PO TABS
20.0000 mg | ORAL_TABLET | Freq: Every day | ORAL | Status: DC
  Administered 2019-02-02 – 2019-02-05 (×4): 20 mg via ORAL
  Filled 2019-02-01 (×4): qty 1

## 2019-02-01 MED ORDER — OXYCODONE-ACETAMINOPHEN 5-325 MG PO TABS
1.0000 | ORAL_TABLET | ORAL | Status: DC | PRN
  Administered 2019-02-01 – 2019-02-05 (×16): 1 via ORAL
  Filled 2019-02-01 (×16): qty 1

## 2019-02-01 MED ORDER — MENTHOL 3 MG MT LOZG: 1.0000 | LOZENGE | OROMUCOSAL | Status: DC | PRN

## 2019-02-01 MED ORDER — METHOCARBAMOL 500 MG PO TABS
500.0000 mg | ORAL_TABLET | Freq: Four times a day (QID) | ORAL | Status: DC | PRN
  Administered 2019-02-01 – 2019-02-04 (×8): 500 mg via ORAL
  Filled 2019-02-01 (×7): qty 1

## 2019-02-01 MED ORDER — AMITRIPTYLINE HCL 25 MG PO TABS
150.0000 mg | ORAL_TABLET | Freq: Every day | ORAL | Status: DC
  Administered 2019-02-01 – 2019-02-04 (×4): 150 mg via ORAL
  Filled 2019-02-01 (×4): qty 6

## 2019-02-01 MED ORDER — ACETAMINOPHEN 325 MG PO TABS: 650.0000 mg | ORAL_TABLET | ORAL | Status: DC | PRN

## 2019-02-01 MED ORDER — 0.9 % SODIUM CHLORIDE (POUR BTL) OPTIME
TOPICAL | Status: DC | PRN
  Administered 2019-02-01 (×2): 1000 mL

## 2019-02-01 MED ORDER — DEXAMETHASONE SODIUM PHOSPHATE 10 MG/ML IJ SOLN
INTRAMUSCULAR | Status: DC | PRN
  Administered 2019-02-01: 10 mg via INTRAVENOUS

## 2019-02-01 MED ORDER — MIDAZOLAM HCL 5 MG/5ML IJ SOLN
INTRAMUSCULAR | Status: DC | PRN
  Administered 2019-02-01: 2 mg via INTRAVENOUS

## 2019-02-01 MED ORDER — VANCOMYCIN HCL IN DEXTROSE 1-5 GM/200ML-% IV SOLN
INTRAVENOUS | Status: AC
  Filled 2019-02-01: qty 200

## 2019-02-01 MED ORDER — OXYCODONE HCL 5 MG PO TABS
ORAL_TABLET | ORAL | Status: AC
  Filled 2019-02-01: qty 1

## 2019-02-01 MED ORDER — LISINOPRIL 10 MG PO TABS
10.0000 mg | ORAL_TABLET | Freq: Every day | ORAL | Status: DC
  Administered 2019-02-02 – 2019-02-05 (×4): 10 mg via ORAL
  Filled 2019-02-01 (×4): qty 1

## 2019-02-01 MED ORDER — SUGAMMADEX SODIUM 200 MG/2ML IV SOLN
INTRAVENOUS | Status: DC | PRN
  Administered 2019-02-01: 200 mg via INTRAVENOUS

## 2019-02-01 MED ORDER — PROMETHAZINE HCL 25 MG/ML IJ SOLN
INTRAMUSCULAR | Status: AC
  Filled 2019-02-01: qty 1

## 2019-02-01 MED ORDER — METHOCARBAMOL 1000 MG/10ML IJ SOLN
500.0000 mg | Freq: Four times a day (QID) | INTRAVENOUS | Status: DC | PRN
  Filled 2019-02-01: qty 5

## 2019-02-01 MED ORDER — METHOCARBAMOL 500 MG PO TABS
ORAL_TABLET | ORAL | Status: AC
  Filled 2019-02-01: qty 1

## 2019-02-01 MED ORDER — THROMBIN 20000 UNITS EX SOLR
CUTANEOUS | Status: DC | PRN
  Administered 2019-02-01: 20 mL via TOPICAL

## 2019-02-01 MED ORDER — PHENOL 1.4 % MT LIQD: 1.0000 | OROMUCOSAL | Status: DC | PRN

## 2019-02-01 MED ORDER — ALBUMIN HUMAN 5 % IV SOLN
INTRAVENOUS | Status: DC | PRN
  Administered 2019-02-01 (×3): via INTRAVENOUS

## 2019-02-01 MED ORDER — ACETAMINOPHEN 650 MG RE SUPP: 650.0000 mg | RECTAL | Status: DC | PRN

## 2019-02-01 MED ORDER — MORPHINE SULFATE (PF) 2 MG/ML IV SOLN
2.0000 mg | INTRAVENOUS | Status: DC | PRN
  Administered 2019-02-02 – 2019-02-04 (×3): 2 mg via INTRAVENOUS
  Filled 2019-02-01 (×4): qty 1

## 2019-02-01 MED ORDER — TRANEXAMIC ACID 1000 MG/10ML IV SOLN
INTRAVENOUS | Status: DC | PRN
  Administered 2019-02-01: 1000 mg via INTRAVENOUS

## 2019-02-01 MED ORDER — LACTATED RINGERS IV SOLN
INTRAVENOUS | Status: DC | PRN
  Administered 2019-02-01 (×2): via INTRAVENOUS

## 2019-02-01 MED ORDER — LACTATED RINGERS IV SOLN
INTRAVENOUS | Status: DC
  Administered 2019-02-01: 10:00:00 via INTRAVENOUS

## 2019-02-01 MED ORDER — CEFAZOLIN SODIUM-DEXTROSE 2-4 GM/100ML-% IV SOLN
INTRAVENOUS | Status: AC
  Filled 2019-02-01: qty 100

## 2019-02-01 MED ORDER — FENTANYL CITRATE (PF) 250 MCG/5ML IJ SOLN
INTRAMUSCULAR | Status: DC | PRN
  Administered 2019-02-01 (×2): 50 ug via INTRAVENOUS
  Administered 2019-02-01: 150 ug via INTRAVENOUS
  Administered 2019-02-01 (×6): 50 ug via INTRAVENOUS

## 2019-02-01 MED ORDER — SODIUM CHLORIDE 0.9% FLUSH: 3.0000 mL | INTRAVENOUS | Status: DC | PRN

## 2019-02-01 SURGICAL SUPPLY — 79 items
ADH SKN CLS APL DERMABOND .7 (GAUZE/BANDAGES/DRESSINGS) ×2
APL SKNCLS STERI-STRIP NONHPOA (GAUZE/BANDAGES/DRESSINGS) ×1
BAG DECANTER FOR FLEXI CONT (MISCELLANEOUS) ×3 IMPLANT
BASKET BONE COLLECTION (BASKET) ×3 IMPLANT
BENZOIN TINCTURE PRP APPL 2/3 (GAUZE/BANDAGES/DRESSINGS) ×3 IMPLANT
BLADE CLIPPER SURG (BLADE) IMPLANT
BONE CANC CHIPS 40CC CAN1/2 (Bone Implant) ×6 IMPLANT
BUR MATCHSTICK NEURO 3.0 LAGG (BURR) ×3 IMPLANT
CANISTER SUCT 3000ML PPV (MISCELLANEOUS) ×3 IMPLANT
CARTRIDGE OIL MAESTRO DRILL (MISCELLANEOUS) ×1 IMPLANT
CHIPS CANC BONE 40CC CAN1/2 (Bone Implant) ×2 IMPLANT
CLEANER TIP ELECTROSURG 2X2 (MISCELLANEOUS) ×2 IMPLANT
CLOSURE STERI-STRIP 1/2X4 (GAUZE/BANDAGES/DRESSINGS) ×1
CLOSURE WOUND 1/2 X4 (GAUZE/BANDAGES/DRESSINGS) ×2
CLSR STERI-STRIP ANTIMIC 1/2X4 (GAUZE/BANDAGES/DRESSINGS) ×1 IMPLANT
CONT SPEC 4OZ CLIKSEAL STRL BL (MISCELLANEOUS) ×3 IMPLANT
COVER BACK TABLE 60X90IN (DRAPES) ×3 IMPLANT
COVER WAND RF STERILE (DRAPES) ×3 IMPLANT
DERMABOND ADVANCED (GAUZE/BANDAGES/DRESSINGS) ×4
DERMABOND ADVANCED .7 DNX12 (GAUZE/BANDAGES/DRESSINGS) ×1 IMPLANT
DIFFUSER DRILL AIR PNEUMATIC (MISCELLANEOUS) ×3 IMPLANT
DRAPE C-ARM 42X72 X-RAY (DRAPES) ×3 IMPLANT
DRAPE C-ARMOR (DRAPES) ×3 IMPLANT
DRAPE LAPAROTOMY 100X72X124 (DRAPES) ×3 IMPLANT
DRAPE SURG 17X23 STRL (DRAPES) ×3 IMPLANT
DRSG OPSITE POSTOP 4X10 (GAUZE/BANDAGES/DRESSINGS) ×2 IMPLANT
DRSG OPSITE POSTOP 4X6 (GAUZE/BANDAGES/DRESSINGS) ×2 IMPLANT
DURAPREP 26ML APPLICATOR (WOUND CARE) ×3 IMPLANT
ELECT CAUTERY BLADE 6.4 (BLADE) ×2 IMPLANT
ELECT REM PT RETURN 9FT ADLT (ELECTROSURGICAL) ×3
ELECTRODE REM PT RTRN 9FT ADLT (ELECTROSURGICAL) ×1 IMPLANT
EVACUATOR 1/8 PVC DRAIN (DRAIN) ×3 IMPLANT
GAUZE 4X4 16PLY RFD (DISPOSABLE) IMPLANT
GLOVE BIO SURGEON STRL SZ7 (GLOVE) ×2 IMPLANT
GLOVE BIO SURGEON STRL SZ8 (GLOVE) ×6 IMPLANT
GLOVE BIOGEL PI IND STRL 6.5 (GLOVE) IMPLANT
GLOVE BIOGEL PI IND STRL 7.0 (GLOVE) IMPLANT
GLOVE BIOGEL PI IND STRL 8 (GLOVE) IMPLANT
GLOVE BIOGEL PI INDICATOR 6.5 (GLOVE) ×6
GLOVE BIOGEL PI INDICATOR 7.0 (GLOVE) ×6
GLOVE BIOGEL PI INDICATOR 8 (GLOVE) ×2
GLOVE SURG SS PI 6.0 STRL IVOR (GLOVE) ×8 IMPLANT
GLOVE SURG SS PI 7.5 STRL IVOR (GLOVE) ×12 IMPLANT
GOWN STRL REUS W/ TWL LRG LVL3 (GOWN DISPOSABLE) IMPLANT
GOWN STRL REUS W/ TWL XL LVL3 (GOWN DISPOSABLE) ×2 IMPLANT
GOWN STRL REUS W/TWL 2XL LVL3 (GOWN DISPOSABLE) IMPLANT
GOWN STRL REUS W/TWL LRG LVL3 (GOWN DISPOSABLE) ×15
GOWN STRL REUS W/TWL XL LVL3 (GOWN DISPOSABLE) ×3
GRAFT BNE CHIP CANC 1-8 40 (Bone Implant) IMPLANT
HEMOSTAT POWDER KIT SURGIFOAM (HEMOSTASIS) ×4 IMPLANT
KIT BASIN OR (CUSTOM PROCEDURE TRAY) ×3 IMPLANT
KIT TURNOVER KIT B (KITS) ×3 IMPLANT
MILL MEDIUM DISP (BLADE) ×3 IMPLANT
NDL HYPO 25X1 1.5 SAFETY (NEEDLE) ×1 IMPLANT
NEEDLE HYPO 25X1 1.5 SAFETY (NEEDLE) ×3 IMPLANT
NS IRRIG 1000ML POUR BTL (IV SOLUTION) ×5 IMPLANT
OIL CARTRIDGE MAESTRO DRILL (MISCELLANEOUS) ×3
PACK LAMINECTOMY NEURO (CUSTOM PROCEDURE TRAY) ×3 IMPLANT
PAD ARMBOARD 7.5X6 YLW CONV (MISCELLANEOUS) ×9 IMPLANT
PUTTY DBM 10CC ×4 IMPLANT
ROD INVICTUS 300 (Rod) ×4 IMPLANT
SCREW KODIAK 5.5X45 (Screw) ×12 IMPLANT
SCREW KODIAK 5.5X50 (Screw) ×8 IMPLANT
SCREW KODIAK 6.5X50MM (Screw) ×4 IMPLANT
SET SCREW (Screw) ×36 IMPLANT
SET SCREW SPNE (Screw) IMPLANT
SPONGE LAP 4X18 RFD (DISPOSABLE) IMPLANT
SPONGE SURGIFOAM ABS GEL 100 (HEMOSTASIS) ×3 IMPLANT
STRIP CLOSURE SKIN 1/2X4 (GAUZE/BANDAGES/DRESSINGS) ×4 IMPLANT
SUT VIC AB 0 CT1 18XCR BRD8 (SUTURE) ×1 IMPLANT
SUT VIC AB 0 CT1 8-18 (SUTURE) ×9
SUT VIC AB 2-0 CP2 18 (SUTURE) ×7 IMPLANT
SUT VIC AB 3-0 SH 8-18 (SUTURE) ×6 IMPLANT
SWAB COLLECTION DEVICE MRSA (MISCELLANEOUS) ×2 IMPLANT
SYR CONTROL 10ML LL (SYRINGE) ×3 IMPLANT
TOWEL GREEN STERILE (TOWEL DISPOSABLE) ×3 IMPLANT
TOWEL GREEN STERILE FF (TOWEL DISPOSABLE) ×3 IMPLANT
TRAY FOLEY MTR SLVR 16FR STAT (SET/KITS/TRAYS/PACK) ×3 IMPLANT
WATER STERILE IRR 1000ML POUR (IV SOLUTION) ×3 IMPLANT

## 2019-02-01 NOTE — Progress Notes (Signed)
Pharmacy Antibiotic Note  Howard Fox is a 58 y.o. male admitted on 02/01/2019 with surgical prophylaxis.  Pharmacy has been consulted for Ancef dosing. Patient with drain in back post-op.  Plan: Ancef 1 gram IV q8hr Monitor renal function and LOT based on drain.  Height: 6' (182.9 cm) Weight: 226 lb 3.2 oz (102.6 kg) IBW/kg (Calculated) : 77.6  Temp (24hrs), Avg:97.9 F (36.6 C), Min:97.8 F (36.6 C), Max:98.1 F (36.7 C)  Recent Labs  Lab 01/29/19 1054  WBC 7.6  CREATININE 0.75    Estimated Creatinine Clearance: 126.2 mL/min (by C-G formula based on SCr of 0.75 mg/dL).    Allergies  Allergen Reactions  . Codeine Itching  . Dilaudid [Hydromorphone Hcl] Itching  . Shellfish Allergy Diarrhea and Nausea And Vomiting    Antimicrobials this admission: Ancef 9/11 >>   Thank you for allowing pharmacy to be a part of this patient's care.  Alanda Slim, PharmD, [facility] Clinical Pharmacist Please see AMION for all Pharmacists' Contact Phone Numbers 02/01/2019, 9:04 PM

## 2019-02-01 NOTE — Transfer of Care (Signed)
Immediate Anesthesia Transfer of Care Note  Patient: Howard Fox  Procedure(s) Performed: Laminectomy and Foraminotomy - Thoracic ten-Thoracic eleven, Instrumented fusion Thoracic eight-Lumbar two (N/A Back)  Patient Location: PACU  Anesthesia Type:General  Level of Consciousness: awake, alert , oriented and patient cooperative  Airway & Oxygen Therapy: Patient Spontanous Breathing and Patient connected to face mask oxygen  Post-op Assessment: Report given to RN, Post -op Vital signs reviewed and stable and Patient moving all extremities X 4  Post vital signs: Reviewed and stable  Last Vitals:  Vitals Value Taken Time  BP 142/85 02/01/19 1611  Temp    Pulse 107 02/01/19 1613  Resp 58 02/01/19 1613  SpO2 97 % 02/01/19 1613  Vitals shown include unvalidated device data.  Last Pain:  Vitals:   02/01/19 0930  PainSc: 8       Patients Stated Pain Goal: 5 ([id number])  Complications: No apparent anesthesia complications

## 2019-02-01 NOTE — H&P (Signed)
Subjective: Patient is a 58 y.o. male admitted for thoracic diskitis/ destruction and stenosis. Onset of symptoms was several months ago, gradually worsening since that time.  The pain is rated severe, and is located at the across the lower back and radiates to legs. The pain is described as aching and occurs all day. The symptoms have been progressive. Symptoms are exacerbated by exercise. MRI or CT showed diskitis with kyphosis/ disk space destruction and stenosis   Past Medical History:  Diagnosis Date  . Anxiety 10/25/2018  . Arthritis   . Bipolar 1 disorder ([place])   . Depression   . Fracture, ribs 1993   ruptured stomach, left lung resulted from industrial fall  . Heart murmur    as a child  . Hepatitis C   . History of kidney stones   . Hypertension   . Kidney stones   . Vertebral osteomyelitis ([place]) 10/25/2018    Past Surgical History:  Procedure Laterality Date  . CHOLECYSTECTOMY    . laser surgery for kidney stones Left   . LITHOTRIPSY      Prior to Admission medications   Medication Sig Start Date End Date Taking? Authorizing Provider  albuterol (PROVENTIL HFA;VENTOLIN HFA) 108 (90 Base) MCG/ACT inhaler INHALE 2 PUFFS BY MOUTH EVERY 4 HOURS AS NEEDED FOR WHEEZING AND FOR SHORTNESS OF BREATH Patient taking differently: Inhale 2 puffs into the lungs every 4 (four) hours as needed for wheezing or shortness of breath.  08/21/17  Yes Unk Pinto, MD  amitriptyline (ELAVIL) 150 MG tablet Take 1 tablet at Bedtime for Sleep & Mood Patient taking differently: Take 150 mg by mouth at bedtime.  09/23/18  Yes Unk Pinto, MD  aspirin EC 325 MG tablet Take 325 mg by mouth daily.    Yes [provider]  carisoprodol (SOMA) 350 MG tablet Take 350 mg by mouth every 6 (six) hours as needed for muscle spasms.    Yes [provider]  cholecalciferol (VITAMIN D3) 25 MCG (1000 UT) tablet Take 5,000 Units by mouth daily.   Yes [provider]  citalopram (CELEXA)  40 MG tablet Take 20 mg by mouth daily with lunch.   Yes [provider]  gabapentin (NEURONTIN) 300 MG capsule TAKE 1 CAPSULE BY MOUTH THREE TIMES DAILY AS NEEDED FOR PAIN Patient taking differently: Take 300 mg by mouth 3 (three) times daily as needed (pain).  12/05/18  Yes Liane Comber, NP  ibuprofen (ADVIL,MOTRIN) 200 MG tablet Take 800-1,200 mg by mouth daily after lunch.    Yes [provider]  lisinopril-hydrochlorothiazide (ZESTORETIC) 20-25 MG tablet Take 1 tablet Daily for BP Patient taking differently: Take 0.5 tablets by mouth daily with lunch.  11/19/18  Yes Unk Pinto, MD  Menthol-Camphor (ICY HOT ADVANCED RELIEF) 16-11 % CREA Apply 1 application topically 4 (four) times daily as needed (pain).    Yes [provider]  oxyCODONE-acetaminophen (PERCOCET) 10-325 MG tablet Take 1 tablet by mouth every 6 (six) hours as needed for pain.  11/01/18  Yes [provider]  polyethylene glycol (MIRALAX / GLYCOLAX) 17 g packet Take 17 g by mouth daily as needed. Patient taking differently: Take 17 g by mouth daily as needed for mild constipation.  10/15/18  Yes Adhikari, Tamsen Meek, MD  Sofosbuvir-Velpatasvir (EPCLUSA) 400-100 MG TABS Take 1 tablet by mouth daily. 11/06/18  Yes Tommy Medal, Lavell Islam, MD  citalopram (CELEXA) 10 MG tablet Take 1 tablet (10 mg total) by mouth daily. Patient not taking: Reported on  01/23/2019 10/16/18   Shelly Coss, MD  lidocaine (LIDODERM) 5 % Place 1 patch onto the skin daily. Remove & Discard patch within 12 hours or as directed by MD Patient not taking: Reported on 01/23/2019 11/29/18   Rodell Perna A, PA-C  linezolid (ZYVOX) 600 MG tablet Take 1 tablet (600 mg total) by mouth every 12 (twelve) hours. 10/25/18   Truman Hayward, MD  rosuvastatin (CRESTOR) 40 MG tablet Take 1/2 to 1 tablet daily or as directed for Cholesterol Patient not taking: Reported on 01/23/2019 09/01/17   Unk Pinto, MD   Allergies  Allergen Reactions   . Codeine Itching  . Dilaudid [Hydromorphone Hcl] Itching  . Shellfish Allergy Diarrhea and Nausea And Vomiting    Social History   Tobacco Use  . Smoking status: Current Every Day Smoker    Packs/day: 0.50    Years: 42.00    Pack years: 21.00    Types: Cigarettes    Start date: 01/02/1977  . Smokeless tobacco: Never Used  Substance Use Topics  . Alcohol use: Yes    Comment: 2 beers a day    Family History  Problem Relation Age of Onset  . Mental illness Mother        Bipolar  . Stroke Father   . Arthritis Father      Review of Systems  Positive ROS: neg  All other systems have been reviewed and were otherwise negative with the exception of those mentioned in the HPI and as above.  Objective: Vital signs in last 24 hours: Temp:  [98 F (36.7 C)] 98 F (36.7 C) (09/11 0918) Pulse Rate:  [78] 78 (09/11 0918) Resp:  [20] 20 (09/11 0918) BP: (151)/(88) 151/88 (09/11 0918) SpO2:  [99 %] 99 % (09/11 0918) Weight:  [102.6 kg] 102.6 kg (09/11 0930)  General Appearance: Alert, cooperative, no distress, appears stated age Head: Normocephalic, without obvious abnormality, atraumatic Eyes: PERRL, conjunctiva/corneas clear, EOM's intact    Neck: Supple, symmetrical, trachea midline Back: Symmetric, no curvature, ROM normal, no CVA tenderness Lungs:  respirations unlabored Heart: Regular rate and rhythm Abdomen: Soft, non-tender Extremities: Extremities normal, atraumatic, no cyanosis or edema Pulses: 2+ and symmetric all extremities Skin: Skin color, texture, turgor normal, no rashes or lesions  NEUROLOGIC:   Mental status: Alert and oriented x4,  no aphasia, good attention span, fund of knowledge, and memory Motor Exam - grossly normal Sensory Exam - grossly normal Reflexes: 2+ Coordination - grossly normal Gait - not tested Balance - grossly normal Cranial Nerves: I: smell Not tested  II: visual acuity  OS: nl    OD: nl  II: visual fields Full to confrontation   II: pupils Equal, round, reactive to light  III,VII: ptosis None  III,IV,VI: extraocular muscles  Full ROM  V: mastication Normal  V: facial light touch sensation  Normal  V,VII: corneal reflex  Present  VII: facial muscle function - upper  Normal  VII: facial muscle function - lower Normal  VIII: hearing Not tested  IX: soft palate elevation  Normal  IX,X: gag reflex Present  XI: trapezius strength  5/5  XI: sternocleidomastoid strength 5/5  XI: neck flexion strength  5/5  XII: tongue strength  Normal    Data Review Lab Results  Component Value Date   WBC 7.6 01/29/2019   HGB 13.3 01/29/2019   HCT 41.2 01/29/2019   MCV 89.4 01/29/2019   PLT 379 01/29/2019   Lab Results  Component Value Date  NA 135 01/29/2019   K 4.6 01/29/2019   CL 100 01/29/2019   CO2 25 01/29/2019   BUN 10 01/29/2019   CREATININE 0.75 01/29/2019   GLUCOSE 98 01/29/2019   Lab Results  Component Value Date   INR 1.0 01/29/2019    Assessment/Plan:  Estimated body mass index is 30.68 kg/m as calculated from the following:   Height as of this encounter: 6' (1.829 m).   Weight as of this encounter: 102.6 kg. Patient admitted for thoracolumbar fusion for diskitis. Patient has failed a reasonable attempt at conservative therapy.  I explained the condition and procedure to the patient and answered any questions.  Patient wishes to proceed with procedure as planned. Understands risks/ benefits and typical outcomes of procedure.   Eustace Moore 02/01/2019 10:45 AM

## 2019-02-01 NOTE — Op Note (Signed)
02/01/2019  4:06 PM  PATIENT:  Howard Fox  58 y.o. male  PRE-OPERATIVE DIAGNOSIS:  1.  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 Decompressive thoracic laminectomy with medial facetectomy T10 and T11  2.  Segmental posterior fixation T8-L2 using Alphatec pedicle screws.  4. Intertransverse arthrodesis T7-L2 using morcellized autograft and allograft.  SURGEON:  Sherley Bounds, MD  ASSISTANTS: Glenford Peers, FNP  ANESTHESIA:  General  EBL: 1000 ml  Total I/O In: 1750 [I.V.:1000; IV Piggyback:750] Out: 2115 [Urine:1115; Blood:1000]  BLOOD ADMINISTERED:none  DRAINS: none   INDICATION FOR PROCEDURE: This patient presented with difficulty with gait.  He had a history of discitis at T10-11.. Imaging revealed aggressive destruction of the disc space with kyphosis and spinal stenosis with cord compression and early signal change in the cord. The patient tried a reasonable attempt at conservative medical measures without relief. I recommended decompression and instrumented fusion to address the stenosis as well as the segmental  instability.  Patient understood the risks, benefits, and alternatives and potential outcomes and wished to proceed.  PROCEDURE DETAILS:  The patient was brought to the operating room. After induction of generalized endotracheal anesthesia the patient was rolled into the prone position on chest rolls and all pressure points were padded. The patient's thoracic and lumbar region was cleaned and then prepped with DuraPrep and draped in the usual sterile fashion. Anesthesia was injected and then a dorsal midline incision was made and carried down to the thoracic and fascia. The fascia was opened and the paraspinous musculature was taken down in a subperiosteal fashion to expose T7-L2. A self-retaining retractor was placed.  Intraoperative fluoroscopy confirmed my level, and I started with placement of the L1 and L2 cortical pedicle screws. The pedicle screw entry zones were identified utilizing surface landmarks and  AP and lateral fluoroscopy. I scored the cortex with the high-speed drill and then used the hand drill to drill an upward and outward direction into the pedicle. I then tapped line to line. I then placed a 5.5 x 45 mm cortical pedicle screw into the pedicles of L1 and L2 bilaterally.    I then turned my attention to the decompression and complete lumbar laminectomies, hemi- facetectomies, and foraminotomies were performed at T10 and T11.  The yellow ligament was removed to expose the underlying dura and nerve roots.  Significant compression at the pedicle of T11 and just above the T11 pedicle especially on the right.  This was carefully decompressed by drilling the lamina down to an eggshell and then gently removing it with a Kerrison punch.  Once the decompression was complete, I turned my attention to the placement of the thoracic pedicle screws. The pedicle screw entry zones were identified utilizing surface landmarks and AP and lateral fluoroscopy. I wrote each pedicle with the pedicle probe and then palpated with a ball probe.  I then tapped with a 4.5 tap for the 5.5 screws and a 5.5 tap for the 6.5 screws.  We then placed 5.5 x 50 mm pedicle screws into the pedicles bilaterally at T8-T10 bilaterally and 6.5 x 50 mm pedicle screws at T12.  We then checked our placement with AP and lateral fluoroscopy.  We then decorticated the transverse processes and interlaminar space and laid a mixture of morcellized autograft and allograft out over these to perform intertransverse arthrodesis at T7-L2 bilaterally. We then  bent our rods into her thoracic kyphosis and lumbar lordosis and placed 210 mm rods into the multiaxial screw heads of the pedicle screws and locked these in position with the locking caps and anti-torque  device. We then checked our construct with AP and lateral fluoroscopy. Irrigated with copious amounts of bacitracin-containing saline solution. Inspected the nerve roots once again to assure adequate decompression, lined to the dura with Gelfoam, placed 2 medium Hemovac drains, and closed the muscle and the fascia with 0 Vicryl. Closed the subcutaneous tissues with 2-0 Vicryl and subcuticular tissues with 3-0 Vicryl. The skin was closed with benzoin and Steri-Strips. Dressing was then applied, the patient was awakened from general anesthesia and transported to the recovery room in stable condition. At the end of the procedure all sponge, needle and instrument counts were correct.   PLAN OF CARE: admit to inpatient  PATIENT DISPOSITION:  PACU - hemodynamically stable.   Delay start of Pharmacological VTE agent (>24hrs) due to surgical blood loss or risk of bleeding:  yes

## 2019-02-01 NOTE — Anesthesia Procedure Notes (Signed)
Procedure Name: Intubation Date/Time: 02/01/2019 11:18 AM Performed by: Colin Benton, CRNA Pre-anesthesia Checklist: Patient identified, Emergency Drugs available, Suction available and Patient being monitored Patient Re-evaluated:Patient Re-evaluated prior to induction Oxygen Delivery Method: Circle System Utilized Preoxygenation: Pre-oxygenation with 100% oxygen Induction Type: IV induction Ventilation: Mask ventilation without difficulty Laryngoscope Size: Miller and 2 Grade View: Grade I Tube type: Oral Tube size: 7.5 mm Number of attempts: 1 Airway Equipment and Method: Stylet and Oral airway Placement Confirmation: ETT inserted through vocal cords under direct vision,  positive ETCO2 and breath sounds checked- equal and bilateral Secured at: 23 cm Tube secured with: Tape Dental Injury: Teeth and Oropharynx as per pre-operative assessment

## 2019-02-02 MED ORDER — ALPRAZOLAM 0.5 MG PO TABS
0.5000 mg | ORAL_TABLET | Freq: Three times a day (TID) | ORAL | Status: DC | PRN
  Administered 2019-02-02 – 2019-02-05 (×6): 0.5 mg via ORAL
  Filled 2019-02-02 (×6): qty 1

## 2019-02-02 NOTE — Evaluation (Signed)
Physical Therapy Evaluation Patient Details Name: Howard Fox MRN: [id number] DOB: [birth year] Today's Date: 02/02/2019   History of Present Illness  40 male s/p decompression and fusion T10-11. PMH including Anxiety, bipolar 1 disorder, Hep C, and HTN.  Clinical Impression  Patient is s/p above surgery resulting in the deficits listed below (see PT Problem List). Prior to admission, pt living with his mother, ambulatory without an assistive device. On PT evaluation, pt ambulating 25 feet with walker and min guard assist. Suspect pt will make steady progress given appropriate pain control/management. Will need further education reinforcement regarding spinal precautions and brace use. Patient will benefit from skilled PT to increase their independence and safety with mobility (while adhering to their precautions) to allow discharge to the venue listed below.     Follow Up Recommendations Home health PT;Supervision/Assistance - 24 hour    Equipment Recommendations  Rolling walker with 5" wheels    Recommendations for Other Services       Precautions / Restrictions Precautions Precautions: Back Precaution Booklet Issued: Yes (comment) Precaution Comments: Providing education on back precautions Required Braces or Orthoses: Spinal Brace Spinal Brace: Thoracolumbosacral orthotic;Applied in sitting position Restrictions Weight Bearing Restrictions: No      Mobility  Bed Mobility Overal bed mobility: Needs Assistance Bed Mobility: Rolling;Sidelying to Sit Rolling: Min guard Sidelying to sit: Min assist       General bed mobility comments: Min A to intiate elevating trunk into sitting  Transfers Overall transfer level: Needs assistance Equipment used: Rolling walker (2 wheeled) Transfers: Sit to/from Stand Sit to Stand: Min guard         General transfer comment: Min Guard A for safety  Ambulation/Gait Ambulation/Gait assistance: Counsellor (Feet): 25  Feet Assistive device: Rolling walker (2 wheeled) Gait Pattern/deviations: Step-through pattern;Decreased stride length Gait velocity: decreased   General Gait Details: Shorter step lengths, good posture, no gross unsteadiness  Stairs            Wheelchair Mobility    Modified Rankin (Stroke Patients Only)       Balance Overall balance assessment: Needs assistance Sitting-balance support: No upper extremity supported;Feet supported Sitting balance-Leahy Scale: Good     Standing balance support: Bilateral upper extremity supported;During functional activity;No upper extremity supported Standing balance-Leahy Scale: Fair Standing balance comment: Able to maintain static standing                             Pertinent Vitals/Pain Pain Assessment: Faces Faces Pain Scale: Hurts even more Pain Location: back Pain Descriptors / Indicators: Aching;Discomfort;Grimacing;Guarding Pain Intervention(s): Monitored during session;Repositioned;Limited activity within patient's tolerance    Home Living Family/patient expects to be discharged to:: Private residence Living Arrangements: Parent Available Help at Discharge: Family Type of Home: House Home Access: Stairs to enter   Technical brewer of Steps: 3 Home Layout: Two level;Able to live on main level with bedroom/bathroom Home Equipment: Grab bars - toilet;Grab bars - tub/shower;Hand held shower head;Bedside commode(mom will look for shower seat) Additional Comments: Sleeps in recliner    Prior Function Level of Independence: Needs assistance   Gait / Transfers Assistance Needed: No DME for mobility  ADL's / Homemaking Assistance Needed: Pt reports was indpenedent with ADLs and IADLs untill back pain ~3 months ago; has been performing BADLs        Hand Dominance   Dominant Hand: Right    Extremity/Trunk Assessment   Upper Extremity Assessment Upper  Extremity Assessment: Overall WFL for tasks  assessed    Lower Extremity Assessment Lower Extremity Assessment: Overall WFL for tasks assessed    Cervical / Trunk Assessment Cervical / Trunk Assessment: Other exceptions Cervical / Trunk Exceptions: s/p back sx  Communication   Communication: No difficulties  Cognition Arousal/Alertness: Awake/alert Behavior During Therapy: WFL for tasks assessed/performed Overall Cognitive Status: Within Functional Limits for tasks assessed                                 General Comments: Focus on pain and requiring cues for encouragement.      General Comments      Exercises     Assessment/Plan    PT Assessment Patient needs continued PT services  PT Problem List Decreased strength;Decreased activity tolerance;Decreased balance;Decreased mobility;Pain       PT Treatment Interventions DME instruction;Gait training;Stair training;Functional mobility training;Therapeutic activities;Therapeutic exercise;Balance training;Patient/family education    PT Goals (Current goals can be found in the Care Plan section)  Acute Rehab PT Goals Patient Stated Goal: "Go home" PT Goal Formulation: With patient Time For Goal Achievement: 02/16/19 Potential to Achieve Goals: Good    Frequency Min 5X/week   Barriers to discharge        Co-evaluation PT/OT/SLP Co-Evaluation/Treatment: Yes Reason for Co-Treatment: For patient/therapist safety PT goals addressed during session: Mobility/safety with mobility         AM-PAC PT "6 Clicks" Mobility  Outcome Measure Help needed turning from your back to your side while in a flat bed without using bedrails?: A Little Help needed moving from lying on your back to sitting on the side of a flat bed without using bedrails?: A Little Help needed moving to and from a bed to a chair (including a wheelchair)?: A Little Help needed standing up from a chair using your arms (e.g., wheelchair or bedside chair)?: A Little Help needed to walk in  hospital room?: A Little Help needed climbing 3-5 steps with a railing? : A Little 6 Click Score: 18    End of Session Equipment Utilized During Treatment: Other (comment)(back brace not available; limited amb) Activity Tolerance: Patient tolerated treatment well Patient left: in chair;with call bell/phone within reach Nurse Communication: Mobility status PT Visit Diagnosis: Pain;Difficulty in walking, not elsewhere classified (R26.2) Pain - part of body: (back)    Time: [id number] PT Time Calculation (min) (ACUTE ONLY): 25 min   Charges:   PT Evaluation $PT Eval Moderate Complexity: 1 Mod          Ellamae Sia, PT, DPT Acute Rehabilitation Services Pager [phone] Office [phone]   Willy Eddy 02/02/2019, 3:28 PM

## 2019-02-02 NOTE — Progress Notes (Signed)
Subjective: Patient reports anxious, "tingling all over"  Objective: Vital signs in last 24 hours: Temp:  [97.8 F (36.6 C)-98.7 F (37.1 C)] 98.2 F (36.8 C) (09/12 0759) Pulse Rate:  [79-109] 89 (09/12 0759) Resp:  [12-22] 20 (09/12 0759) BP: (99-171)/(56-148) 129/81 (09/12 0759) SpO2:  [88 %-99 %] 96 % (09/12 0759)  Intake/Output from previous day: 09/11 0701 - 09/12 0700 In: 2600 [I.V.:1500; IV Piggyback:1100] Out: 4465 [Urine:3315; Drains:150; Blood:1000] Intake/Output this shift: No intake/output data recorded.  Physical Exam: Strength full bilateral upper and lower extremities.  Dressing CDI.  Lab Results: No results for input(s): WBC, HGB, HCT, PLT in the last 72 hours. BMET No results for input(s): NA, K, CL, CO2, GLUCOSE, BUN, CREATININE, CALCIUM in the last 72 hours.  Studies/Results: Dg Lumbar Spine 2-3 Views  Result Date: 02/01/2019 CLINICAL DATA:  T10-L2 spinal fusion EXAM: DG C-ARM 1-60 MIN; LUMBAR SPINE - 2-3 VIEW COMPARISON:  Rib radiographs November 08, 2018, same-day fluoroscopy, MRI December 26, 2018, December 18, 2018 FINDINGS: Fluoroscopic images appear to demonstrate a T10-L2 lumbar spinal fusion though exact levels are difficult to discern given narrow collimation of the fluoroscope. Additional fracture metallic wires are unchanged from comparison studies. No unexpected radiopaque foreign bodies are identified. IMPRESSION: T10-L2 lumbar spinal fusion, as above. Electronically Signed   By: Lovena Le M.D.   On: 02/01/2019 19:54   Dg C-arm 1-60 Min  Result Date: 02/01/2019 CLINICAL DATA:  T10-L2 spinal fusion EXAM: DG C-ARM 1-60 MIN; LUMBAR SPINE - 2-3 VIEW COMPARISON:  Rib radiographs November 08, 2018, same-day fluoroscopy, MRI December 26, 2018, December 18, 2018 FINDINGS: Fluoroscopic images appear to demonstrate a T10-L2 lumbar spinal fusion though exact levels are difficult to discern given narrow collimation of the fluoroscope. Additional fracture metallic wires are  unchanged from comparison studies. No unexpected radiopaque foreign bodies are identified. IMPRESSION: T10-L2 lumbar spinal fusion, as above. Electronically Signed   By: Lovena Le M.D.   On: 02/01/2019 19:54    Assessment/Plan: Patient is nauseated, anxious, c/o heartburn.  Will give anxiolytic.  Neurologically stable.    LOS: 1 day    Peggyann Shoals, MD 02/02/2019, 10:05 AM

## 2019-02-02 NOTE — Progress Notes (Signed)
PT Cancellation Note  Patient Details Name: Howard Fox MRN: [id number] DOB: 08/25/1960   Cancelled Treatment:    Reason Eval/Treat Not Completed: Other (comment) Pt very nauseous, politely declining therapy evaluation currently. RN aware.  Ellamae Sia, PT, DPT Acute Rehabilitation Services Pager [phone] Office [phone]    Willy Eddy 02/02/2019, 8:48 AM

## 2019-02-02 NOTE — Progress Notes (Signed)
Orthopedic Tech Progress Note Patient Details:  Howard Fox [birth year] [id number] Called in order to [facility] for an TLSO  Patient ID: Howard Fox, male   DOB: 05/06/1961, 58 y.o.   MRN: [id number]   [name] 02/02/2019, 9:35 AM

## 2019-02-02 NOTE — Evaluation (Signed)
Occupational Therapy Evaluation Patient Details Name: Howard Fox MRN: [id number] DOB: [birth year] Today's Date: 02/02/2019    History of Present Illness 51 male s/p decompression and fusion T10-11. PMH including Anxiety, bipolar 1 disorder, Hep C, and HTN.   Clinical Impression   PTA, pt was living with his mother and was independent with ADLs. Pt currently requiring Max A for brace management, Min A for LB ADLs, and Min Guard A for functional transfers with RW. Pt presenting with decreased ROM and activity tolerance due to pain. Provided education on back precautions, bed mobility, compensatory techniques for LB ADLs, and functional transfers. Pt would benefit from further acute OT to facilitate safe dc. Recommend dc to home with HHOT for further OT to optimize safety, independence with ADLs, and return to PLOF.      Follow Up Recommendations  Home health OT;Supervision/Assistance - 24 hour    Equipment Recommendations  None recommended by OT    Recommendations for Other Services PT consult     Precautions / Restrictions Precautions Precautions: Back Precaution Booklet Issued: Yes (comment) Precaution Comments: Providing education on back precautions Required Braces or Orthoses: Spinal Brace Spinal Brace: Thoracolumbosacral orthotic;Applied in sitting position Restrictions Weight Bearing Restrictions: No      Mobility Bed Mobility Overal bed mobility: Needs Assistance Bed Mobility: Rolling;Sidelying to Sit Rolling: Min guard Sidelying to sit: Min assist       General bed mobility comments: Min A to intiate elevating trunk into sitting  Transfers Overall transfer level: Needs assistance Equipment used: Rolling walker (2 wheeled) Transfers: Sit to/from Stand Sit to Stand: Min guard         General transfer comment: Min Guard A for safety    Balance Overall balance assessment: Needs assistance Sitting-balance support: No upper extremity supported;Feet  supported Sitting balance-Leahy Scale: Good     Standing balance support: Bilateral upper extremity supported;During functional activity;No upper extremity supported Standing balance-Leahy Scale: Fair Standing balance comment: Able to maintain static standing                           ADL either performed or assessed with clinical judgement   ADL Overall ADL's : Needs assistance/impaired Eating/Feeding: Set up;Sitting   Grooming: Set up;Sitting   Upper Body Bathing: Minimal assistance;Sitting   Lower Body Bathing: Minimal assistance;Sit to/from stand   Upper Body Dressing : Maximal assistance;Sitting Upper Body Dressing Details (indicate cue type and reason): Max A for donning brace Lower Body Dressing: Minimal assistance;Sit to/from stand Lower Body Dressing Details (indicate cue type and reason): Pt able to bring ankles to knees to adjust socks. Provided education on figure four technique for LB dressing. Toilet Transfer: Ambulation;RW;Min guard(simulated to Psychologist, occupational Details (indicate cue type and reason): Min Guard A for balance and safety         Functional mobility during ADLs: Min guard;Rolling walker General ADL Comments: Pt presenting with decreased ROM and activity tolerance due to pain.      Vision         Perception     Praxis      Pertinent Vitals/Pain Pain Assessment: Faces Faces Pain Scale: Hurts even more Pain Location: back Pain Descriptors / Indicators: Aching;Discomfort;Grimacing;Guarding Pain Intervention(s): Monitored during session;Limited activity within patient's tolerance;Repositioned     Hand Dominance Right   Extremity/Trunk Assessment Upper Extremity Assessment Upper Extremity Assessment: Overall WFL for tasks assessed   Lower Extremity Assessment Lower Extremity Assessment: Defer to PT  evaluation   Cervical / Trunk Assessment Cervical / Trunk Assessment: Other exceptions Cervical / Trunk Exceptions:  s/p back sx   Communication Communication Communication: No difficulties   Cognition Arousal/Alertness: Awake/alert Behavior During Therapy: WFL for tasks assessed/performed Overall Cognitive Status: Within Functional Limits for tasks assessed                                 General Comments: Focus on pain and requiring cues for encouragement.   General Comments  VSS.    Exercises     Shoulder Instructions      Home Living Family/patient expects to be discharged to:: Private residence Living Arrangements: Parent Available Help at Discharge: Family Type of Home: House Home Access: Stairs to enter Entrance Stairs-Number of Steps: 3   Home Layout: Two level;Able to live on main level with bedroom/bathroom     Bathroom Shower/Tub: Occupational psychologist: Handicapped height     Home Equipment: Grab bars - toilet;Grab bars - tub/shower;Hand held shower head;Bedside commode(mom will look for shower seat)   Additional Comments: Sleeps in recliner      Prior Functioning/Environment Level of Independence: Needs assistance  Gait / Transfers Assistance Needed: No DME for mobility ADL's / Homemaking Assistance Needed: Pt reports was indpenedent with ADLs and IADLs untill back pain ~3 months ago; has been performing BADLs            OT Problem List: Decreased strength;Decreased range of motion;Decreased activity tolerance;Impaired balance (sitting and/or standing);Decreased knowledge of use of DME or AE;Decreased knowledge of precautions;Pain;Decreased safety awareness      OT Treatment/Interventions: Self-care/ADL training;Therapeutic exercise;Energy conservation;DME and/or AE instruction;Patient/family education;Therapeutic activities    OT Goals(Current goals can be found in the care plan section) Acute Rehab OT Goals Patient Stated Goal: "Go home" OT Goal Formulation: With patient Time For Goal Achievement: 02/16/19 Potential to Achieve Goals:  Good  OT Frequency: Min 3X/week   Barriers to D/C:            Co-evaluation PT/OT/SLP Co-Evaluation/Treatment: Yes Reason for Co-Treatment: For patient/therapist safety   OT goals addressed during session: ADL's and self-care      AM-PAC OT "6 Clicks" Daily Activity     Outcome Measure Help from another person eating meals?: None Help from another person taking care of personal grooming?: A Little Help from another person toileting, which includes using toliet, bedpan, or urinal?: A Little Help from another person bathing (including washing, rinsing, drying)?: A Little Help from another person to put on and taking off regular upper body clothing?: A Lot Help from another person to put on and taking off regular lower body clothing?: A Little 6 Click Score: 18   End of Session Equipment Utilized During Treatment: Rolling walker;Back brace Nurse Communication: Mobility status;Precautions  Activity Tolerance: Patient tolerated treatment well Patient left: in chair;with call bell/phone within reach;with nursing/sitter in room  OT Visit Diagnosis: Unsteadiness on feet (R26.81);Other abnormalities of gait and mobility (R26.89);Muscle weakness (generalized) (M62.81);Pain Pain - part of body: (Back)                Time: [id number] OT Time Calculation (min): 25 min Charges:  OT General Charges $OT Visit: 1 Visit OT Evaluation $OT Eval Moderate Complexity: Raymond, OTR/L Acute Rehab Pager: [phone] Office: [place] 02/02/2019, 1:22 PM

## 2019-02-03 NOTE — Progress Notes (Signed)
Physical Therapy Treatment Patient Details Name: Howard Fox MRN: [id number] DOB: 1960/12/11 Today's Date: 02/03/2019    History of Present Illness 68 male s/p decompression and fusion T10-11. PMH including Anxiety, bipolar 1 disorder, Hep C, and HTN.    PT Comments    Pt making good progress towards physical therapy goals. Improved pain control and activity tolerance today. Requiring min assist for transfers, ambulating 150 feet with walker and min guard assist. Reinforced spinal precautions, proper brace use. Will continue to progress as tolerated.    Follow Up Recommendations  Home health PT;Supervision/Assistance - 24 hour     Equipment Recommendations  Rolling walker with 5" wheels    Recommendations for Other Services       Precautions / Restrictions Precautions Precautions: Back Precaution Booklet Issued: Yes (comment) Precaution Comments: Recalling 2/3 precautions Required Braces or Orthoses: Spinal Brace Spinal Brace: Thoracolumbosacral orthotic;Applied in sitting position Restrictions Weight Bearing Restrictions: No    Mobility  Bed Mobility Overal bed mobility: Needs Assistance Bed Mobility: Rolling;Sidelying to Sit Rolling: Supervision Sidelying to sit: Supervision       General bed mobility comments: OOB in chair  Transfers Overall transfer level: Needs assistance Equipment used: Rolling walker (2 wheeled) Transfers: Sit to/from Stand Sit to Stand: Min assist Stand pivot transfers: Min guard       General transfer comment: Light min assist to stand x 2 from recliner. Increased time to achieve upright, cues for knee extension  Ambulation/Gait Ambulation/Gait assistance: Min guard Gait Distance (Feet): 200 Feet Assistive device: Rolling walker (2 wheeled) Gait Pattern/deviations: Step-through pattern;Decreased stride length;Trunk flexed Gait velocity: decreased   General Gait Details: Guarded gait pattern, cues for scapular depression. Noted  decreased push off during terminal stance   Stairs             Wheelchair Mobility    Modified Rankin (Stroke Patients Only)       Balance Overall balance assessment: Needs assistance Sitting-balance support: No upper extremity supported;Feet supported Sitting balance-Leahy Scale: Good     Standing balance support: Bilateral upper extremity supported;During functional activity;No upper extremity supported Standing balance-Leahy Scale: Fair                              Cognition Arousal/Alertness: Awake/alert Behavior During Therapy: WFL for tasks assessed/performed Overall Cognitive Status: Within Functional Limits for tasks assessed                                        Exercises      General Comments        Pertinent Vitals/Pain Pain Assessment: Faces Pain Score: 10-Worst pain ever Faces Pain Scale: Hurts even more Pain Location: back Pain Descriptors / Indicators: Sore Pain Intervention(s): Monitored during session;Repositioned    Home Living                      Prior Function            PT Goals (current goals can now be found in the care plan section) Acute Rehab PT Goals Patient Stated Goal: "Go home" Potential to Achieve Goals: Good Progress towards PT goals: Progressing toward goals    Frequency    Min 5X/week      PT Plan Current plan remains appropriate    Co-evaluation  AM-PAC PT "6 Clicks" Mobility   Outcome Measure  Help needed turning from your back to your side while in a flat bed without using bedrails?: A Little Help needed moving from lying on your back to sitting on the side of a flat bed without using bedrails?: A Little Help needed moving to and from a bed to a chair (including a wheelchair)?: A Little Help needed standing up from a chair using your arms (e.g., wheelchair or bedside chair)?: A Little Help needed to walk in hospital room?: A Little Help needed  climbing 3-5 steps with a railing? : A Little 6 Click Score: 18    End of Session Equipment Utilized During Treatment: Back brace Activity Tolerance: Patient tolerated treatment well Patient left: in chair;with call bell/phone within reach Nurse Communication: Mobility status PT Visit Diagnosis: Pain;Difficulty in walking, not elsewhere classified (R26.2)     Time: [id number] PT Time Calculation (min) (ACUTE ONLY): 15 min  Charges:  $Gait Training: 8-22 mins                     Ellamae Sia, PT, DPT [place] Pager [phone] Office [phone]    Howard Fox 02/03/2019, 9:58 AM

## 2019-02-03 NOTE — Progress Notes (Signed)
Subjective: Patient reports much better today  Objective: Vital signs in last 24 hours: Temp:  [97.7 F (36.5 C)-98.3 F (36.8 C)] 97.7 F (36.5 C) (09/13 0837) Pulse Rate:  [85-97] 90 (09/13 0837) Resp:  [18-20] 20 (09/13 0837) BP: (103-145)/(65-87) 118/75 (09/13 0837) SpO2:  [94 %-99 %] 98 % (09/13 0837)  Intake/Output from previous day: 09/12 0701 - 09/13 0700 In: 790.3 [P.O.:720; I.V.:20.3; IV Piggyback:50] Out: 325 [Urine:200; Drains:125] Intake/Output this shift: Total I/O In: 243 [P.O.:240; I.V.:3] Out: 300 [Urine:300]  Physical Exam: Up in chair in brace.  MAEW with good power.  Lab Results: No results for input(s): WBC, HGB, HCT, PLT in the last 72 hours. BMET No results for input(s): NA, K, CL, CO2, GLUCOSE, BUN, CREATININE, CALCIUM in the last 72 hours.  Studies/Results: Dg Lumbar Spine 2-3 Views  Result Date: 02/01/2019 CLINICAL DATA:  T10-L2 spinal fusion EXAM: DG C-ARM 1-60 MIN; LUMBAR SPINE - 2-3 VIEW COMPARISON:  Rib radiographs November 08, 2018, same-day fluoroscopy, MRI December 26, 2018, December 18, 2018 FINDINGS: Fluoroscopic images appear to demonstrate a T10-L2 lumbar spinal fusion though exact levels are difficult to discern given narrow collimation of the fluoroscope. Additional fracture metallic wires are unchanged from comparison studies. No unexpected radiopaque foreign bodies are identified. IMPRESSION: T10-L2 lumbar spinal fusion, as above. Electronically Signed   By: Lovena Le M.D.   On: 02/01/2019 19:54   Dg C-arm 1-60 Min  Result Date: 02/01/2019 CLINICAL DATA:  T10-L2 spinal fusion EXAM: DG C-ARM 1-60 MIN; LUMBAR SPINE - 2-3 VIEW COMPARISON:  Rib radiographs November 08, 2018, same-day fluoroscopy, MRI December 26, 2018, December 18, 2018 FINDINGS: Fluoroscopic images appear to demonstrate a T10-L2 lumbar spinal fusion though exact levels are difficult to discern given narrow collimation of the fluoroscope. Additional fracture metallic wires are unchanged from  comparison studies. No unexpected radiopaque foreign bodies are identified. IMPRESSION: T10-L2 lumbar spinal fusion, as above. Electronically Signed   By: Lovena Le M.D.   On: 02/01/2019 19:54    Assessment/Plan: D/C drain.  Much less anxious today. Tingling has resolved.  Mobilize with PT.    LOS: 2 days    Peggyann Shoals, MD 02/03/2019, 10:02 AM

## 2019-02-03 NOTE — Anesthesia Postprocedure Evaluation (Signed)
Anesthesia Post Note  Patient: Howard Fox  Procedure(s) Performed: Laminectomy and Foraminotomy - Thoracic ten-Thoracic eleven, Instrumented fusion Thoracic eight-Lumbar two (N/A Back)     Patient location during evaluation: PACU Anesthesia Type: General Level of consciousness: awake and alert and oriented Pain management: pain level controlled Vital Signs Assessment: post-procedure vital signs reviewed and stable Respiratory status: spontaneous breathing, nonlabored ventilation and respiratory function stable Cardiovascular status: blood pressure returned to baseline Postop Assessment: no apparent nausea or vomiting Anesthetic complications: no      Brennan Bailey

## 2019-02-03 NOTE — Progress Notes (Signed)
Occupational Therapy Treatment Patient Details Name: Howard Fox MRN: [id number] DOB: 17-Dec-1960 Today's Date: 02/03/2019    History of present illness 63 male s/p decompression and fusion T10-11. PMH including Anxiety, bipolar 1 disorder, Hep C, and HTN.   OT comments  Pt. Seen for skilled OT treatment session.  Able to complete bed mobility S.  Max a don brace, set up  Seated for LB dressing task.  MIN guard A for sit/stand, stand pivot transfers.  Will benefit from continued brace donning/ub dressing at next session along with progression into b.room for toileting tasks in preparation for safe return home.    Follow Up Recommendations  Home health OT;Supervision/Assistance - 24 hour    Equipment Recommendations  None recommended by OT    Recommendations for Other Services      Precautions / Restrictions Precautions Precautions: Back Precaution Comments: Providing education on back precautions Required Braces or Orthoses: Spinal Brace Spinal Brace: Thoracolumbosacral orthotic;Applied in sitting position       Mobility Bed Mobility Overal bed mobility: Needs Assistance Bed Mobility: Rolling;Sidelying to Sit Rolling: Supervision Sidelying to sit: Supervision       General bed mobility comments: hob flat, utilized b ue on bed rails to roll and bring trunk into sitting (exited on R side of bed)  Transfers Overall transfer level: Needs assistance Equipment used: Rolling walker (2 wheeled) Transfers: Sit to/from [facility] Sit to Stand: Min guard;Min assist Stand pivot transfers: Min guard       General transfer comment: Min Guard A for safety    Balance                                           ADL either performed or assessed with clinical judgement   ADL Overall ADL's : Needs assistance/impaired                 Upper Body Dressing : Maximal assistance;Sitting Upper Body Dressing Details (indicate cue type and  reason): Max A for donning brace, reviewed steps for donning. Lower Body Dressing: Set up;Sitting/lateral leans Lower Body Dressing Details (indicate cue type and reason): pt. able to don B socks seated edge of bed crossing each leg over knee Toilet Transfer: Ambulation;RW;Min guard Toilet Transfer Details (indicate cue type and reason): Min Guard A for balance and safety-simulated from eob amb. approx. 5 steps to recliner         Functional mobility during ADLs: Min guard;Rolling walker General ADL Comments: pt. educated on steps for donning brace.  cont. education/training for him to be able to don without assistance. also stating he wants to be able to sit in a position to utililize urinal. reviewed placement and use while seated.     Vision       Perception     Praxis      Cognition Arousal/Alertness: Awake/alert Behavior During Therapy: WFL for tasks assessed/performed Overall Cognitive Status: Within Functional Limits for tasks assessed                                          Exercises     Shoulder Instructions       General Comments      Pertinent Vitals/ Pain       Pain Assessment: 0-10 Pain  Score: 10-Worst pain ever Pain Location: back- 7 before activity, 10 at end of session Pain Descriptors / Indicators: Aching Pain Intervention(s): Limited activity within patient's tolerance;Monitored during session;Repositioned;Other (comment)(rn present at end of session and states she will bring pt. pain meds)  Home Living                                          Prior Functioning/Environment              Frequency  Min 3X/week        Progress Toward Goals  OT Goals(current goals can now be found in the care plan section)  Progress towards OT goals: Progressing toward goals     Plan Discharge plan remains appropriate    Co-evaluation                 AM-PAC OT "6 Clicks" Daily Activity     Outcome  Measure   Help from another person eating meals?: None Help from another person taking care of personal grooming?: A Little Help from another person toileting, which includes using toliet, bedpan, or urinal?: A Little Help from another person bathing (including washing, rinsing, drying)?: A Little Help from another person to put on and taking off regular upper body clothing?: A Lot Help from another person to put on and taking off regular lower body clothing?: A Little 6 Click Score: 18    End of Session Equipment Utilized During Treatment: Rolling walker;Back brace  OT Visit Diagnosis: Unsteadiness on feet (R26.81);Other abnormalities of gait and mobility (R26.89);Muscle weakness (generalized) (M62.81);Pain   Activity Tolerance Patient tolerated treatment well   Patient Left in chair;with call bell/phone within reach;with nursing/sitter in room   Nurse Communication Other (comment)(rn reports pt does not need chair alarm. pt. educated on importance of using call bell for any assistance)        Time: [id number] OT Time Calculation (min): 21 min  Charges: OT General Charges $OT Visit: 1 Visit OT Treatments $Self Care/Home Management : 8-22 mins   Janice Coffin, COTA/L 02/03/2019, 8:58 AM

## 2019-02-04 ENCOUNTER — Encounter (HOSPITAL_COMMUNITY): Payer: Self-pay | Admitting: Neurological Surgery

## 2019-02-04 DIAGNOSIS — M4640 Discitis, unspecified, site unspecified: Secondary | ICD-10-CM

## 2019-02-04 DIAGNOSIS — F1990 Other psychoactive substance use, unspecified, uncomplicated: Secondary | ICD-10-CM

## 2019-02-04 DIAGNOSIS — Z981 Arthrodesis status: Secondary | ICD-10-CM

## 2019-02-04 DIAGNOSIS — Z8739 Personal history of other diseases of the musculoskeletal system and connective tissue: Secondary | ICD-10-CM

## 2019-02-04 DIAGNOSIS — F17210 Nicotine dependence, cigarettes, uncomplicated: Secondary | ICD-10-CM

## 2019-02-04 DIAGNOSIS — Z885 Allergy status to narcotic agent status: Secondary | ICD-10-CM

## 2019-02-04 DIAGNOSIS — M4620 Osteomyelitis of vertebra, site unspecified: Secondary | ICD-10-CM

## 2019-02-04 DIAGNOSIS — B182 Chronic viral hepatitis C: Secondary | ICD-10-CM

## 2019-02-04 DIAGNOSIS — Z8614 Personal history of Methicillin resistant Staphylococcus aureus infection: Secondary | ICD-10-CM

## 2019-02-04 DIAGNOSIS — Z91013 Allergy to seafood: Secondary | ICD-10-CM

## 2019-02-04 LAB — C-REACTIVE PROTEIN: CRP: 15.8 mg/dL — ABNORMAL HIGH (ref <1.0)

## 2019-02-04 MED ORDER — DOXYCYCLINE HYCLATE 100 MG PO TABS
100.0000 mg | ORAL_TABLET | Freq: Two times a day (BID) | ORAL | Status: DC
  Administered 2019-02-04 – 2019-02-05 (×2): 100 mg via ORAL
  Filled 2019-02-04 (×2): qty 1

## 2019-02-04 MED FILL — Gelatin Absorbable MT Powder: OROMUCOSAL | Qty: 1 | Status: AC

## 2019-02-04 MED FILL — Thrombin For Soln 5000 Unit: CUTANEOUS | Qty: 5000 | Status: AC

## 2019-02-04 NOTE — Progress Notes (Signed)
Occupational Therapy Treatment Patient Details Name: BLAYDE BOAT MRN: [id number] DOB: Jan 24, 1961 Today's Date: 02/04/2019    History of present illness 62 male s/p decompression and fusion T10-11. PMH including Anxiety, bipolar 1 disorder, Hep C, and HTN.   OT comments  Pt continues to make progress towards goals. Pt presented with minimal pain impacting ability to complete ADLs. Pt recalled 2/3 back precautions, but required 1 visual cue to recall the last precaution. Pt donned back brace and underwear with mod I for increased time. Pt completed functional mobility and bed mobility with mod I for increased time. Continue to recommend dc home with HHOT. Will continue to follow acutely as admitted.    Follow Up Recommendations  Home health OT;Supervision/Assistance - 24 hour    Equipment Recommendations  None recommended by OT    Recommendations for Other Services PT consult    Precautions / Restrictions Precautions Precautions: Back Precaution Booklet Issued: Yes (comment) Precaution Comments: able to recall 2/3, required 1 visual cue to recall the 3rd Required Braces or Orthoses: Spinal Brace Spinal Brace: Thoracolumbosacral orthotic;Applied in sitting position Restrictions Weight Bearing Restrictions: No       Mobility Bed Mobility Overal bed mobility: Modified Independent(increased time) Bed Mobility: Rolling;Sit to Sidelying Rolling: Modified independent (Device/Increase time)(increase time)       Sit to sidelying: Modified independent (Device/Increase time)(increase time) General bed mobility comments: Pt sitting EOB upon arrival. Pt demonstrated mod I for bed moiblity at end of session  Transfers Overall transfer level: Modified independent Equipment used: None Transfers: Sit to/from Stand Sit to Stand: Modified independent (Device/Increase time)(increased time)         General transfer comment: Pt demonstrated use of arms to push from bed to stand     Balance Overall balance assessment: Modified Independent Sitting-balance support: No upper extremity supported;Feet supported Sitting balance-Leahy Scale: Good     Standing balance support: No upper extremity supported Standing balance-Leahy Scale: Good Standing balance comment: pt able to maintain balance during static standing and functional mobility                            ADL either performed or assessed with clinical judgement   ADL Overall ADL's : Modified independent                 Upper Body Dressing : Modified independent;Sitting Upper Body Dressing Details (indicate cue type and reason): Pt demonstrated donning brace with mod I for increased time. Pt initially donned brace incorrectly, but was able to realize and correct with increased time Lower Body Dressing: Modified independent;Adhering to back precautions;Sit to/from stand Lower Body Dressing Details (indicate cue type and reason): Pt demonstrated figure 4 technique to perform LB dressing while maintain precautions with mod I for increased time             Functional mobility during ADLs: Modified independent(increased time) General ADL Comments: Pt demonstrated mod I requiring increased time to complete ADLs     Vision       Perception     Praxis      Cognition Arousal/Alertness: Awake/alert Behavior During Therapy: WFL for tasks assessed/performed Overall Cognitive Status: Within Functional Limits for tasks assessed                                         Exercises  Shoulder Instructions       General Comments     Pertinent Vitals/ Pain       Pain Assessment: Faces Pain Score: Faces Pain Scale: Hurts a little bit Pain Location: back  Pain Descriptors / Indicators: Discomfort;Sore Pain Intervention(s): Monitored during session  Home Living                                          Prior Functioning/Environment               Frequency  Min 3X/week        Progress Toward Goals  OT Goals(current goals can now be found in the care plan section)  Progress towards OT goals: Progressing toward goals  Acute Rehab OT Goals Patient Stated Goal: "Go home" OT Goal Formulation: With patient Time For Goal Achievement: 02/16/19 Potential to Achieve Goals: Good ADL Goals Pt Will Perform Grooming: with set-up;with supervision;standing Pt Will Perform Upper Body Dressing: with set-up;with supervision;sitting Pt Will Perform Lower Body Dressing: with set-up;with supervision;sit to/from stand Pt Will Transfer to Toilet: with set-up;with supervision;ambulating;regular height toilet Pt Will Perform Toileting - Clothing Manipulation and hygiene: with set-up;with supervision;sit to/from stand;sitting/lateral leans Additional ADL Goal #1: Pt will independently verbalize 3/3 back precautions Additional ADL Goal #2: Pt will perform bed mobility using log roll technique with Supervision in preparation for ADLs  Plan Discharge plan remains appropriate    Co-evaluation                 AM-PAC OT "6 Clicks" Daily Activity     Outcome Measure   Help from another person eating meals?: None Help from another person taking care of personal grooming?: None Help from another person toileting, which includes using toliet, bedpan, or urinal?: None Help from another person bathing (including washing, rinsing, drying)?: None Help from another person to put on and taking off regular upper body clothing?: None Help from another person to put on and taking off regular lower body clothing?: None 6 Click Score: 24    End of Session Equipment Utilized During Treatment: Back brace  OT Visit Diagnosis: Unsteadiness on feet (R26.81);Other abnormalities of gait and mobility (R26.89);Muscle weakness (generalized) (M62.81);Pain Pain - part of body: (back)   Activity Tolerance Patient tolerated treatment well   Patient Left in  bed;with call bell/phone within reach   Nurse Communication Mobility status        Time: 1353-1410 OT Time Calculation (min): 17 min  Charges: OT General Charges $OT Visit: 1 Visit OT Treatments $Self Care/Home Management : 8-22 mins  Gus Rankin, OT Student  Gus Rankin 02/04/2019, 3:59 PM

## 2019-02-04 NOTE — Progress Notes (Signed)
Physical Therapy Treatment Patient Details Name: Howard Fox MRN: [id number] DOB: October 23, 1960 Today's Date: 02/04/2019    History of Present Illness 85 male s/p decompression and fusion T10-11. PMH including Anxiety, bipolar 1 disorder, Hep C, and HTN.    PT Comments    Pt progressing well. Pt ambulates safely with and without AD however would benefit from AD for long distance ambulation to optimize posture with onset of fatigue. Pt able to safely navigate stairs to enter home. Acute PT to cont to follow.   Follow Up Recommendations  Home health PT;Supervision/Assistance - 24 hour     Equipment Recommendations  Rolling walker with 5" wheels    Recommendations for Other Services       Precautions / Restrictions Precautions Precautions: Back Precaution Booklet Issued: Yes (comment) Precaution Comments: able to recall 3/3 Required Braces or Orthoses: Spinal Brace Spinal Brace: Thoracolumbosacral orthotic;Applied in sitting position Restrictions Weight Bearing Restrictions: No    Mobility  Bed Mobility               General bed mobility comments: pt up OOB in chair upon PT arrival  Transfers Overall transfer level: Needs assistance Equipment used: Rolling walker (2 wheeled) Transfers: Sit to/from Stand Sit to Stand: Supervision         General transfer comment: pt with good use of arm rests and demo'd minimal bending  Ambulation/Gait Ambulation/Gait assistance: Min guard Gait Distance (Feet): 500 Feet Assistive device: Rolling walker (2 wheeled);None Gait Pattern/deviations: Step-through pattern;Decreased stride length;Trunk flexed Gait velocity: decreased   General Gait Details: pt amb 300 with RW and 200' without AD. pt with report of onset of fatigue and lower back pain when amb without AD, no episodes of LOB   Stairs Stairs: Yes Stairs assistance: Min guard Stair Management: One rail Left;Step to pattern;Forwards Number of Stairs: 4 General stair  comments: pt with good understanding of sequencing and was steady   Wheelchair Mobility    Modified Rankin (Stroke Patients Only)       Balance Overall balance assessment: Needs assistance Sitting-balance support: No upper extremity supported;Feet supported Sitting balance-Leahy Scale: Good     Standing balance support: Bilateral upper extremity supported;During functional activity;No upper extremity supported Standing balance-Leahy Scale: Fair Standing balance comment: Able to maintain static standing                            Cognition Arousal/Alertness: Awake/alert Behavior During Therapy: WFL for tasks assessed/performed Overall Cognitive Status: Within Functional Limits for tasks assessed                                 General Comments: pt however is being self limiting requesting to stay longer because "my mom needs a break and I need that last shot of morphine tonight"      Exercises      General Comments General comments (skin integrity, edema, etc.): VSS      Pertinent Vitals/Pain Pain Assessment: 0-10 Pain Score: 5  Pain Location: back  Pain Descriptors / Indicators: Sore Pain Intervention(s): Monitored during session    Home Living                      Prior Function            PT Goals (current goals can now be found in the care plan section) Progress towards  PT goals: Progressing toward goals    Frequency    Min 5X/week      PT Plan Current plan remains appropriate    Co-evaluation              AM-PAC PT "6 Clicks" Mobility   Outcome Measure  Help needed turning from your back to your side while in a flat bed without using bedrails?: A Little Help needed moving from lying on your back to sitting on the side of a flat bed without using bedrails?: A Little Help needed moving to and from a bed to a chair (including a wheelchair)?: A Little Help needed standing up from a chair using your arms  (e.g., wheelchair or bedside chair)?: A Little Help needed to walk in hospital room?: A Little Help needed climbing 3-5 steps with a railing? : A Little 6 Click Score: 18    End of Session Equipment Utilized During Treatment: Back brace Activity Tolerance: Patient tolerated treatment well Patient left: in chair;with call bell/phone within reach Nurse Communication: Mobility status PT Visit Diagnosis: Pain;Difficulty in walking, not elsewhere classified (R26.2)     Time: [id number] PT Time Calculation (min) (ACUTE ONLY): 14 min  Charges:  $Gait Training: 8-22 mins                     Kittie Plater, PT, DPT Acute Rehabilitation Services Pager #: [phone] Office #: [phone]    Berline Lopes 02/04/2019, 1:46 PM

## 2019-02-04 NOTE — Consult Note (Signed)
[facility]       Reason for Consult: vertebral osteomyelitis/diskitis    Referring Physician: Sherley Bounds, MD  Active Problems:   S/P lumbar fusion   . amitriptyline  150 mg Oral QHS  . aspirin EC  325 mg Oral Daily  . citalopram  20 mg Oral Q lunch  . lisinopril  10 mg Oral Q lunch   And  . hydrochlorothiazide  12.5 mg Oral Q lunch  . senna  1 tablet Oral BID  . sodium chloride flush  3 mL Intravenous Q12H  . Sofosbuvir-Velpatasvir  1 tablet Oral Daily    Recommendations: 1. Possible vertebral diskitis -patient did receive gradually de-escalating courses of antibiotics, initially with vancomycin which was transitioned to Zyvox for 4 weeks and then gradually to doxycycline thereafter.  While transitioning to doxycycline the patient had normal inflammatory markers with a CRP of 4.8 in mid June.  Intraoperatively Dr. Ronnald Ramp states that the only area of concern was the spinous process which he took a biopsy of.  The culture from his biopsy remains negative so suspicion for ongoing MRSA is rather low at this time.  Given that he has newly implanted spinal hardware, will plan to resume doxycycline 100 mg p.o. twice daily for the next several months and final duration to be determined by Dr. Drucilla Schmidt in the clinic as an outpatient.  We will repeat the patient's CRP today postoperatively.  2. HCV -patient has confirmed infection with genotype 1a with his past risk factor being use of injection drugs.  It is unclear whether the patient has progressed to stopping illicit drug use but he verbally states that he has.  He was previously treated with approximately 1 year of pegylated interferon and Ribavarin for which he failed to respond in the early 2000's.  More recently, the patient was prescribed Epclusa with a mid treatment interruption as noted per HPI.  Several weeks after initiating Epclusa the patient's hepatitis C viral load declined from 1,790,000 to 519 IU.  He  is not return to clinic for further labs so will check a hepatitis C quantitative viral load today.  Patient states that he is due to complete his Epclusa likely within the next 1 to 2 weeks.  We will continue this medication as an inpatient via his home supply until this is exhausted.  Determining the patient's end of treatment date is challenging as he is a extremely poor informant.  We will plan to repeat the patient's hepatitis C viral load in 2 weeks if today's level is undetectable he will follow-up with Dr. Drucilla Schmidt for further outpatient management.  Assessment: The patient is a 58 y/o WM with h/o IVDU and chronic HCV who was admitted earlier this year for MRSA sepsis & T10-T11 diskitis, s/p ABX treatment admitted for progression of back pain requiring  T7-L2 spinal fusion and bone grafting.  Antibiotics: Cefazolin, day 4 epclusa  HPI: AMAURIE GRIGAS is a 58 y.o. white male with history of IV drug use and chronic hepatitis C as well as MRSA sepsis and T10-11 discitis earlier this year who was admitted on February 01, 2019 for planned spinal fusion.  Previously this year in May, the patient was admitted for MRSA sepsis and T10-T11 discitis with accompanying vertebral osteomyelitis.  He was initially treated with IV vancomycin for 1 week as an inpatient and upon Dr. Lucianne Lei Dam's recommendations was transitioned to oral Zyvox as an outpatient.  At the completion of for further  weeks of treatment, the patient was further de-escalated to doxycycline.  He did have normalization of his inflammatory markers with a CRP of 4.8 (down from 23.9 initially) in mid-June.  He states that he continue doxycycline until late July when he was told to stop all his medications in preparation for spinal surgery.  Please note that cessation of his antimicrobials as well as his hepatitis C meds were not advised by Dr. Tommy Medal.  His spinal fusion was performed on the day of admission here.  Dr. Ronnald Ramp commented that overall there  was no evidence of infection to the patient's spine with the exception of one spinous process that had been degenerating, so he obtained wound culture at that site.  Culture from the patient's spinous process has remained negative postoperatively.  He has been maintained on cefazolin awaiting our evaluation.  Patient states that since his spinal surgery he is feeling "much better."  He also denies any systemic fevers or chills or wound drainage prior to his admission.  Regarding the patient's hepatitis C, he was recently started on Epclusa in June by Dr. Drucilla Schmidt for genotype 1a infection with a pretreatment viral load of 1,790,000 IU.  Unfortunately, the patient self aborted his Epclusa treatment after approximately 1 month and remained off the medication for approximately 2 weeks per his report due to misunderstandings in the patient's instruction from his surgeon to stop medications prior to surgery.  This would explain why his hepatitis C viral level had declined but was still remain detectable on August 12 to 519 IU.  Despite instructions from our clinic to stop Epclusa, the patient continue take this medication as he had access to it.  He states that he is continue take this medication and now has only approximately 1 to 2 weeks left of his supply. Fever curve, WBC & Cr trends, imaging, cx results, and ABX usage all independently reviewed  Review of Systems:  Review of Systems  Constitutional: Negative for chills, fever and weight loss.  HENT: Negative for congestion, hearing loss, sinus pain and sore throat.   Eyes: Negative for blurred vision, photophobia and discharge.  Respiratory: Negative for cough, hemoptysis and shortness of breath.   Cardiovascular: Negative for chest pain, palpitations, orthopnea and leg swelling.  Gastrointestinal: Negative for abdominal pain, constipation, diarrhea, heartburn, nausea and vomiting.  Genitourinary: Negative for dysuria, flank pain, frequency and urgency.   Musculoskeletal: Positive for back pain. Negative for joint pain and myalgias.  Skin: Negative for itching and rash.  Neurological: Positive for weakness. Negative for tremors, seizures and headaches.  Endo/Heme/Allergies: Negative for polydipsia. Does not bruise/bleed easily.  Psychiatric/Behavioral: Negative for depression and substance abuse. The patient is not nervous/anxious and does not have insomnia.      All other systems reviewed and are negative    Past Medical History:  Diagnosis Date  . Anxiety 10/25/2018  . Arthritis   . Bipolar 1 disorder ([place])   . Depression   . Fracture, ribs 1993   ruptured stomach, left lung resulted from industrial fall  . Heart murmur    as a child  . Hepatitis C   . History of kidney stones   . Hypertension   . Kidney stones   . Vertebral osteomyelitis ([place]) 10/25/2018    Social History   Tobacco Use  . Smoking status: Current Every Day Smoker    Packs/day: 0.50    Years: 42.00    Pack years: 21.00    Types: Cigarettes  Start date: 01/02/1977  . Smokeless tobacco: Never Used  Substance Use Topics  . Alcohol use: Yes    Comment: 2 beers a day  . Drug use: Yes    Frequency: 1.0 times per week    Types: Marijuana    Comment: last time 2 weeks ago    Family History  Problem Relation Age of Onset  . Mental illness Mother        Bipolar  . Stroke Father   . Arthritis Father      Current Facility-Administered Medications:  .  0.9 %  sodium chloride infusion, 250 mL, Intravenous, Continuous, Eustace Moore, MD, Last Rate: 1 mL/hr at 02/01/19 2148, 250 mL at 02/01/19 2148 .  acetaminophen (TYLENOL) tablet 650 mg, 650 mg, Oral, Q4H PRN **OR** acetaminophen (TYLENOL) suppository 650 mg, 650 mg, Rectal, Q4H PRN, Eustace Moore, MD .  albuterol (PROVENTIL) (2.5 MG/3ML) 0.083% nebulizer solution 2.5 mg, 2.5 mg, Nebulization, Q4H PRN, Eustace Moore, MD .  ALPRAZolam Duanne Moron) tablet 0.5 mg, 0.5 mg, Oral, TID PRN, Erline Levine, MD, 0.5 mg  at 02/04/19 0523 .  amitriptyline (ELAVIL) tablet 150 mg, 150 mg, Oral, QHS, Eustace Moore, MD, 150 mg at 02/03/19 2221 .  aspirin EC tablet 325 mg, 325 mg, Oral, Daily, Eustace Moore, MD, 325 mg at 02/04/19 1004 .  carisoprodol (SOMA) tablet 350 mg, 350 mg, Oral, Q6H PRN, Eustace Moore, MD, 350 mg at 02/03/19 1027 .  ceFAZolin (ANCEF) IVPB 1 g/50 mL premix, 1 g, Intravenous, Q8H, Eustace Moore, MD, Last Rate: 100 mL/hr at 02/04/19 0523, 1 g at 02/04/19 0523 .  citalopram (CELEXA) tablet 20 mg, 20 mg, Oral, Q lunch, Eustace Moore, MD, 20 mg at 02/03/19 1229 .  lisinopril (ZESTRIL) tablet 10 mg, 10 mg, Oral, Q lunch, 10 mg at 02/03/19 1229 **AND** hydrochlorothiazide (MICROZIDE) capsule 12.5 mg, 12.5 mg, Oral, Q lunch, Eustace Moore, MD, 12.5 mg at 02/03/19 1229 .  menthol-cetylpyridinium (CEPACOL) lozenge 3 mg, 1 lozenge, Oral, PRN **OR** phenol (CHLORASEPTIC) mouth spray 1 spray, 1 spray, Mouth/Throat, PRN, Eustace Moore, MD .  morphine 2 MG/ML injection 2 mg, 2 mg, Intravenous, Q2H PRN, Eustace Moore, MD, 2 mg at 02/03/19 2222 .  ondansetron (ZOFRAN) tablet 4 mg, 4 mg, Oral, Q6H PRN **OR** ondansetron (ZOFRAN) injection 4 mg, 4 mg, Intravenous, Q6H PRN, Eustace Moore, MD, 4 mg at 02/02/19 0817 .  oxyCODONE-acetaminophen (PERCOCET/ROXICET) 5-325 MG per tablet 1 tablet, 1 tablet, Oral, Q4H PRN, 1 tablet at 02/04/19 1004 **AND** oxyCODONE (Oxy IR/ROXICODONE) immediate release tablet 5 mg, 5 mg, Oral, Q4H PRN, Eustace Moore, MD, 5 mg at 02/04/19 1004 .  polyethylene glycol (MIRALAX / GLYCOLAX) packet 17 g, 17 g, Oral, Daily PRN, Eustace Moore, MD, 17 g at 02/04/19 1008 .  senna (SENOKOT) tablet 8.6 mg, 1 tablet, Oral, BID, Eustace Moore, MD, 8.6 mg at 02/03/19 2222 .  sodium chloride flush (NS) 0.9 % injection 3 mL, 3 mL, Intravenous, Q12H, Eustace Moore, MD, 3 mL at 02/04/19 1008 .  sodium chloride flush (NS) 0.9 % injection 3 mL, 3 mL, Intravenous, PRN, Eustace Moore, MD .   Sofosbuvir-Velpatasvir 400-100 MG TABS 1 tablet, 1 tablet, Oral, Daily, Eustace Moore, MD, 1 tablet at 02/04/19 1004  Allergies  Allergen Reactions  . Codeine Itching  . Dilaudid [Hydromorphone Hcl] Itching  . Shellfish Allergy Diarrhea and Nausea And Vomiting    Vitals:  02/04/19 0451 02/04/19 0835  BP:  104/68  Pulse:  92  Resp: 18 20  Temp: 97.6 F (36.4 C) 97.8 F (36.6 C)  SpO2:  97%     Physical Exam Gen: appears older than stated age, mild distress secondary to back pain, spinal brace is intact, A&Ox 3 Head: NCAT, no temporal wasting evident EENT: PERRL, EOMI, MMM, fair dentition Neck: supple, no JVD CV: NRRR, no murmurs evident Pulm: CTA bilaterally, moderate expiratory wheeze, no retractions Abd: soft, NTND, +BS Extrems:  trace LE edema, 1+ pulses MSK: spinal incision covered with extended thoracic & lumbar bandaging (no shadowing through dressing noted), extrenal spinal brace intact prior to wound exam Skin: no rashes, adequate/poor skin turgor Neuro: CN II-XII grossly intact, no focal neurologic deficits appreciated, gait was not wide stanced, A&Ox 3   Lab Results  Component Value Date   WBC 7.6 01/29/2019   HGB 13.3 01/29/2019   HCT 41.2 01/29/2019   MCV 89.4 01/29/2019   PLT 379 01/29/2019    Lab Results  Component Value Date   CREATININE 0.75 01/29/2019   BUN 10 01/29/2019   NA 135 01/29/2019   K 4.6 01/29/2019   CL 100 01/29/2019   CO2 25 01/29/2019    Lab Results  Component Value Date   ALT 14 01/29/2019   AST 19 01/29/2019   ALKPHOS 80 01/29/2019     Microbiology: Recent Results (from the past 240 hour(s))  Surgical pcr screen     Status: None   Collection Time: 01/29/19 10:54 AM   Specimen: Nasal Mucosa; Nasal Swab  Result Value Ref Range Status   MRSA, PCR NEGATIVE NEGATIVE Final   Staphylococcus aureus NEGATIVE NEGATIVE Final    Comment: (NOTE) The Xpert SA Assay (FDA approved for NASAL specimens in patients 68 years of age and  older), is one component of a comprehensive surveillance program. It is not intended to diagnose infection nor to guide or monitor treatment. Performed at [facility], [place] [address] [place], [place] [address]   Novel Coronavirus, NAA (Hosp order, Send-out to Ref Lab; TAT 18-24 hrs     Status: None   Collection Time: 01/29/19 11:56 AM   Specimen: Nasopharyngeal Swab; Respiratory  Result Value Ref Range Status   SARS-CoV-2, NAA NOT DETECTED NOT DETECTED Final    Comment: (NOTE) This nucleic acid amplification test was developed and its performance characteristics determined by [employer]. Nucleic acid amplification tests include PCR and TMA. This test has not been FDA cleared or approved. This test has been authorized by FDA under an Emergency Use Authorization (EUA). This test is only authorized for the duration of time the declaration that circumstances exist justifying the authorization of the emergency use of in vitro diagnostic tests for detection of SARS-CoV-2 virus and/or diagnosis of COVID-19 infection under section 564(b)(1) of the Act, 21 U.S.C. [id number]) (1), unless the authorization is terminated or revoked sooner. When diagnostic testing is negative, the possibility of a false negative result should be considered in the context of a patient's recent exposures and the presence of clinical signs and symptoms consistent with COVID-19. An individual without symptoms of COVID- 19 and who is not shedding SARS-CoV-2 vi rus would expect to have a negative (not detected) result in this assay. Performed At: [facility] [address] [place], [place] [id number] Rush Farmer MD [id number]    [place]  Final    Comment: Performed at [facility], [place] [address]  [place], [place] [address]  Aerobic/Anaerobic Culture (surgical/deep wound)     Status: None (Preliminary result)   Collection Time: 02/01/19  2:10 PM    Specimen: Soft Tissue, Other  Result Value Ref Range Status   Specimen Description ABSCESS  Final   Special Requests THORACIC  Final   Gram Stain   Final    FEW WBC PRESENT, PREDOMINANTLY PMN NO ORGANISMS SEEN    Culture   Final    NO GROWTH 2 DAYS NO ANAEROBES ISOLATED; CULTURE IN PROGRESS FOR 5 DAYS Performed at [facility], [address], [place], [place] [address]    Report Status PENDING  Incomplete    Janine Ores, MD [email or web address] 02/04/2019, 12:02 PM

## 2019-02-04 NOTE — Progress Notes (Signed)
Patient ID: Howard Fox, male   DOB: 04-18-1961, 58 y.o.   MRN: [id number] Doing well, pain well controlled, still needs to mobilize more, no leg pain or NTW, dressing dry, moves legs well, awaiting ID input re: need for further abx

## 2019-02-05 ENCOUNTER — Other Ambulatory Visit: Payer: Self-pay

## 2019-02-05 LAB — HCV RNA QUANT: HCV Quantitative: NOT DETECTED IU/mL (ref >50)

## 2019-02-05 MED ORDER — DOXYCYCLINE HYCLATE 100 MG PO TABS: 100.0000 mg | ORAL_TABLET | Freq: Two times a day (BID) | ORAL | 0 refills | Status: DC

## 2019-02-05 MED ORDER — MAGNESIUM CITRATE PO SOLN
1.0000 | Freq: Once | ORAL | Status: AC
  Administered 2019-02-05: 1 via ORAL
  Filled 2019-02-05: qty 296

## 2019-02-05 MED ORDER — OXYCODONE-ACETAMINOPHEN 10-325 MG PO TABS: 1.0000 | ORAL_TABLET | Freq: Four times a day (QID) | ORAL | 0 refills | Status: DC | PRN

## 2019-02-05 NOTE — Discharge Instructions (Signed)

## 2019-02-05 NOTE — TOC Transition Note (Signed)
Transition of Care [facility]) - CM/SW Discharge Note Marvetta Gibbons RN,BSN Transitions of Care Unit 4NP coverage - RN Case Manager [phone]   Patient Details  Name: Howard Fox MRN: [id number] Date of Birth: 06/11/1960  [facility]) CM/SW Contact:  Dawayne Patricia, RN Phone Number: 02/05/2019, 11:14 AM   Clinical Narrative:    Pt stable for transition home today. Orders placed for DME needs- 3n1 and RW, - pt moved to discharge unit 5C where CM spoke with pt. Discussed DME needs- pt states he does not feel he needs RW and only wants 3n1- noted on PT note pt did not use AD today and walked 511ft. - discussed HH needs however pt does not want [place] referral at this time- pt will f/u with MD if he returns home and decides that he needs/wants [facility] therapy. Pt lives with mom and has transportation home. Call made to [facility] with [employer] for DME- 3n1 need- 3n1 to be delivered to [facility] prior to pt's discharge.    Final next level of care: Home/Self Care Barriers to Discharge: No Barriers Identified   Patient Goals and CMS Choice Patient states their goals for this hospitalization and ongoing recovery are:: "to recover and get back to normal"   Choice offered to / list presented to : Patient  Discharge Placement  Home                     Discharge Plan and Services   Discharge Planning Services: CM Consult Post Acute Care Choice: Durable Medical Equipment          DME Arranged: 3-N-1 DME Agency: [employer] Date DME Agency Contacted: 02/05/19 Time DME Agency Contacted: [id number] Representative spoke with at DME Agency: [place]: NA [place] Agency: NA        Social Determinants of Health ([place]) Interventions     Readmission Risk Interventions Readmission Risk Prevention Plan 02/05/2019  Transportation Screening Complete  PCP or Specialist Appt within 5-7 Days Complete  Home Care Screening Complete  Medication Review (RN CM) Complete  Some recent data might  be hidden

## 2019-02-05 NOTE — Discharge Summary (Signed)
Physician Discharge Summary  Patient ID: Howard Fox MRN: [id number] DOB/AGE: Apr 18, 1961 58 y.o.  Admit date: 02/01/2019 Discharge date: 02/05/2019  Admission Diagnoses: thoracic diskitis/ stenosis    Discharge Diagnoses: same   Discharged Condition: good  Hospital Course: The patient was admitted on 02/01/2019 and taken to the operating room where the patient underwent thoracolumbar decompression and fusion. The patient tolerated the procedure well and was taken to the recovery room and then to the floor in stable condition. The hospital course was routine. There were no complications. The wound remained clean dry and intact. Pt had appropriate back soreness. No complaints of leg pain or new N/T/W. The patient remained afebrile with stable vital signs, and tolerated a regular diet. The patient continued to increase activities, and pain was well controlled with oral pain medications.   Consults: ID  Significant Diagnostic Studies:  Results for orders placed or performed during the hospital encounter of 02/01/19  Aerobic/Anaerobic Culture (surgical/deep wound)   Specimen: Soft Tissue, Other  Result Value Ref Range   Specimen Description ABSCESS    Special Requests THORACIC    Gram Stain      FEW WBC PRESENT, PREDOMINANTLY PMN NO ORGANISMS SEEN    Culture      NO GROWTH 3 DAYS NO ANAEROBES ISOLATED; CULTURE IN PROGRESS FOR 5 DAYS Performed at [facility] [address] [place], [place] [address]    Report Status PENDING   C-reactive protein  Result Value Ref Range   CRP 15.8 (H) <1.0 mg/dL    Dg Lumbar Spine 2-3 Views  Result Date: 02/01/2019 CLINICAL DATA:  T10-L2 spinal fusion EXAM: DG C-ARM 1-60 MIN; LUMBAR SPINE - 2-3 VIEW COMPARISON:  Rib radiographs November 08, 2018, same-day fluoroscopy, MRI December 26, 2018, December 18, 2018 FINDINGS: Fluoroscopic images appear to demonstrate a T10-L2 lumbar spinal fusion though exact levels are difficult to discern given narrow  collimation of the fluoroscope. Additional fracture metallic wires are unchanged from comparison studies. No unexpected radiopaque foreign bodies are identified. IMPRESSION: T10-L2 lumbar spinal fusion, as above. Electronically Signed   By: Lovena Le M.D.   On: 02/01/2019 19:54   Dg C-arm 1-60 Min  Result Date: 02/01/2019 CLINICAL DATA:  T10-L2 spinal fusion EXAM: DG C-ARM 1-60 MIN; LUMBAR SPINE - 2-3 VIEW COMPARISON:  Rib radiographs November 08, 2018, same-day fluoroscopy, MRI December 26, 2018, December 18, 2018 FINDINGS: Fluoroscopic images appear to demonstrate a T10-L2 lumbar spinal fusion though exact levels are difficult to discern given narrow collimation of the fluoroscope. Additional fracture metallic wires are unchanged from comparison studies. No unexpected radiopaque foreign bodies are identified. IMPRESSION: T10-L2 lumbar spinal fusion, as above. Electronically Signed   By: Lovena Le M.D.   On: 02/01/2019 19:54    Antibiotics:  Anti-infectives (From admission, onward)   Start     Dose/Rate Route Frequency Ordered Stop   02/05/19 0000  doxycycline (VIBRA-TABS) 100 MG tablet     100 mg Oral Every 12 hours 02/05/19 0819     02/04/19 1445  doxycycline (VIBRA-TABS) tablet 100 mg     100 mg Oral Every 12 hours 02/04/19 1435     02/02/19 1000  Sofosbuvir-Velpatasvir 400-100 MG TABS 1 tablet     1 tablet Oral Daily 02/01/19 2050     02/01/19 2200  ceFAZolin (ANCEF) IVPB 1 g/50 mL premix     1 g 100 mL/hr over 30 Minutes Intravenous Every 8 hours 02/01/19 2056     02/01/19 1223  bacitracin 50,000 Units in sodium chloride 0.9 % 500 mL irrigation  Status:  Discontinued       As needed 02/01/19 1223 02/01/19 1618   02/01/19 0921  ceFAZolin (ANCEF) 2-4 GM/100ML-% IVPB    Note to Pharmacy: Cordelia Pen   : cabinet override      02/01/19 0921 02/01/19 1130   02/01/19 0915  ceFAZolin (ANCEF) IVPB 2g/100 mL premix     2 g 200 mL/hr over 30 Minutes Intravenous On call to O.R. 02/01/19 0914  02/01/19 1130      Discharge Exam: Blood pressure (!) 145/94, pulse 96, temperature 97.9 F (36.6 C), temperature source Oral, resp. rate 20, height 6' (1.829 m), weight 102.6 kg, SpO2 94 %. Neurologic: Grossly normal dressing dry  Discharge Medications:   Allergies as of 02/05/2019      Reactions   Codeine Itching   Dilaudid [hydromorphone Hcl] Itching   Shellfish Allergy Diarrhea, Nausea And Vomiting      Medication List    STOP taking these medications   linezolid 600 MG tablet Commonly known as: ZYVOX     TAKE these medications   albuterol 108 (90 Base) MCG/ACT inhaler Commonly known as: VENTOLIN HFA INHALE 2 PUFFS BY MOUTH EVERY 4 HOURS AS NEEDED FOR WHEEZING AND FOR SHORTNESS OF BREATH What changed: See the new instructions.   amitriptyline 150 MG tablet Commonly known as: ELAVIL Take 1 tablet at Bedtime for Sleep & Mood What changed:   how much to take  how to take this  when to take this  additional instructions   aspirin EC 325 MG tablet Take 325 mg by mouth daily.   carisoprodol 350 MG tablet Commonly known as: SOMA Take 350 mg by mouth every 6 (six) hours as needed for muscle spasms.   cholecalciferol 25 MCG (1000 UT) tablet Commonly known as: VITAMIN D3 Take 5,000 Units by mouth daily.   citalopram 40 MG tablet Commonly known as: CELEXA Take 20 mg by mouth daily with lunch.   citalopram 10 MG tablet Commonly known as: CELEXA Take 1 tablet (10 mg total) by mouth daily.   doxycycline 100 MG tablet Commonly known as: VIBRA-TABS Take 1 tablet (100 mg total) by mouth every 12 (twelve) hours.   gabapentin 300 MG capsule Commonly known as: NEURONTIN TAKE 1 CAPSULE BY MOUTH THREE TIMES DAILY AS NEEDED FOR PAIN What changed: See the new instructions.   ibuprofen 200 MG tablet Commonly known as: ADVIL Take 800-1,200 mg by mouth daily after lunch.   Icy Hot Advanced Relief 16-11 % Crea Generic drug: Menthol-Camphor Apply 1 application  topically 4 (four) times daily as needed (pain).   lidocaine 5 % Commonly known as: Lidoderm Place 1 patch onto the skin daily. Remove & Discard patch within 12 hours or as directed by MD   lisinopril-hydrochlorothiazide 20-25 MG tablet Commonly known as: ZESTORETIC Take 1 tablet Daily for BP What changed:   how much to take  how to take this  when to take this  additional instructions   oxyCODONE-acetaminophen 10-325 MG tablet Commonly known as: PERCOCET Take 1 tablet by mouth every 6 (six) hours as needed for pain.   polyethylene glycol 17 g packet Commonly known as: MIRALAX / GLYCOLAX Take 17 g by mouth daily as needed. What changed: reasons to take this   rosuvastatin 40 MG tablet Commonly known as: Crestor Take 1/2 to 1 tablet daily or as directed for Cholesterol   Sofosbuvir-Velpatasvir 400-100 MG Tabs Commonly known  asRaeanne Gathers Take 1 tablet by mouth daily.            Durable Medical Equipment  (From admission, onward)         Start     Ordered   02/01/19 2050  DME Walker rolling  Once    Question:  Patient needs a walker to treat with the following condition  Answer:  S/P lumbar fusion   02/01/19 2050   02/01/19 2050  DME 3 n 1  Once     02/01/19 2050          Disposition: home   Final Dx: thoracolumbar decompression/ fusion  Discharge Instructions    Call MD for:  difficulty breathing, headache or visual disturbances   Complete by: As directed    Call MD for:  persistant nausea and vomiting   Complete by: As directed    Call MD for:  redness, tenderness, or signs of infection (pain, swelling, redness, odor or green/yellow discharge around incision site)   Complete by: As directed    Call MD for:  severe uncontrolled pain   Complete by: As directed    Call MD for:  temperature >100.4   Complete by: As directed    Diet - low sodium heart healthy   Complete by: As directed    Increase activity slowly   Complete by: As directed     Remove dressing in 48 hours   Complete by: As directed          Signed: Eustace Moore 02/05/2019, 8:20 AM

## 2019-02-05 NOTE — Progress Notes (Signed)
RN gave patient discharge instructions and he stated understanding. Rn answered all of pt questions, IV has been removed and prescriptions have been escribed to home pharmacy. Pt waiting for DME to be delivered and ride. Belongings packed and at bedside.

## 2019-02-05 NOTE — Progress Notes (Signed)
Patient is transferred from room 4NP05 to discharge lounge on unit [facility] at this time. Alert and in stable condition. Report given to receiving nurse Arlice Colt with all questions answered. Left unit via wheelchair with all belongings at side.

## 2019-02-05 NOTE — Progress Notes (Signed)
Physical Therapy Treatment Patient Details Name: Howard Fox MRN: [id number] DOB: 05/24/1960 Today's Date: 02/05/2019    History of Present Illness 41 male s/p decompression and fusion T10-11. PMH including Anxiety, bipolar 1 disorder, Hep C, and HTN.    PT Comments    Pt progressing well. TLSO adjusted for optimal position as patient wearing too high. Pt now with understanding of proper fitting. Pt amb 500' without AD and completed stair negotiation. Discussed with patient about d/cing today and patient in agreeance if MD agrees as well. Acute PT to cont to follow.   Follow Up Recommendations  Home health PT;Supervision/Assistance - 24 hour     Equipment Recommendations  Rolling walker with 5" wheels    Recommendations for Other Services       Precautions / Restrictions Precautions Precautions: Back Precaution Booklet Issued: Yes (comment) Precaution Comments: able to recall 2/3, required 1 visual cue to recall the 3rd Required Braces or Orthoses: Spinal Brace Spinal Brace: Thoracolumbosacral orthotic;Applied in sitting position Restrictions Weight Bearing Restrictions: No    Mobility  Bed Mobility               General bed mobility comments: pt up in chair  Transfers Overall transfer level: Modified independent Equipment used: None Transfers: Sit to/from Stand Sit to Stand: Modified independent (Device/Increase time)         General transfer comment: pt with good technique  Ambulation/Gait Ambulation/Gait assistance: Modified independent (Device/Increase time) Gait Distance (Feet): 500 Feet Assistive device: None Gait Pattern/deviations: Step-through pattern;Decreased stride length;Trunk flexed Gait velocity: decreased   General Gait Details: pt pushed IV pole for 100' and then amb without AD, no difficulty, with upright posture, no onset of fatigue   Stairs Stairs: Yes Stairs assistance: Supervision Stair Management: One rail Left;Step to  pattern;Forwards Number of Stairs: 3 General stair comments: pt with good understanding of sequencing and was steady   Wheelchair Mobility    Modified Rankin (Stroke Patients Only)       Balance Overall balance assessment: Modified Independent Sitting-balance support: No upper extremity supported;Feet supported Sitting balance-Leahy Scale: Good     Standing balance support: No upper extremity supported Standing balance-Leahy Scale: Good Standing balance comment: pt able to maintain balance during static standing and functional mobility                             Cognition Arousal/Alertness: Awake/alert Behavior During Therapy: WFL for tasks assessed/performed Overall Cognitive Status: Within Functional Limits for tasks assessed                                        Exercises      General Comments General comments (skin integrity, edema, etc.): VSS      Pertinent Vitals/Pain Pain Assessment: Faces Faces Pain Scale: Hurts a little bit Pain Location: back Pain Descriptors / Indicators: Discomfort Pain Intervention(s): Monitored during session    Home Living                      Prior Function            PT Goals (current goals can now be found in the care plan section) Progress towards PT goals: Progressing toward goals    Frequency    Min 5X/week      PT Plan Current plan remains appropriate  Co-evaluation              AM-PAC PT "6 Clicks" Mobility   Outcome Measure  Help needed turning from your back to your side while in a flat bed without using bedrails?: None Help needed moving from lying on your back to sitting on the side of a flat bed without using bedrails?: None Help needed moving to and from a bed to a chair (including a wheelchair)?: None Help needed standing up from a chair using your arms (e.g., wheelchair or bedside chair)?: None Help needed to walk in hospital room?: None Help needed  climbing 3-5 steps with a railing? : None 6 Click Score: 24    End of Session Equipment Utilized During Treatment: Back brace Activity Tolerance: Patient tolerated treatment well Patient left: in chair;with call bell/phone within reach Nurse Communication: Mobility status PT Visit Diagnosis: Pain;Difficulty in walking, not elsewhere classified (R26.2)     Time: [id number] PT Time Calculation (min) (ACUTE ONLY): 16 min  Charges:  $Gait Training: 8-22 mins                     Kittie Plater, PT, DPT Acute Rehabilitation Services Pager #: [phone] Office #: [phone]    Berline Lopes 02/05/2019, 8:17 AM

## 2019-02-05 NOTE — Progress Notes (Signed)
Patient ID: Howard Fox, male   DOB: June 16, 1960, 58 y.o.   MRN: [id number] Pain well controlled, OOB to chair, moving legs well, seems pleased, home tomorrow

## 2019-02-06 LAB — AEROBIC/ANAEROBIC CULTURE W GRAM STAIN (SURGICAL/DEEP WOUND): Culture: NO GROWTH

## 2019-02-07 ENCOUNTER — Other Ambulatory Visit: Payer: Self-pay | Admitting: Adult Health

## 2019-02-07 ENCOUNTER — Telehealth: Payer: Self-pay | Admitting: *Deleted

## 2019-02-07 DIAGNOSIS — R0789 Other chest pain: Secondary | ICD-10-CM

## 2019-02-07 NOTE — Progress Notes (Signed)
Hospital follow up  Assessment and Plan:  Discitis of thoracic region Continue doxycycline per ID; schedule follow up with Dr. Tommy Medal within the next month -     CBC with Differential/Platelet   Thoracic spinal stenosis/Fusion of spine, thoracolumbar region Has follow up scheduled with Dr. Ronnald Ramp on 9/24 for wound check and follow up Mild erythema and scant purulent discharge at base of incision; continue doxycycline, monitor closely, go to ED if any fever/chills Continue daily dressing changes and close monitoring He reports doing fairly with pain management managed by Dr. Ronnald Ramp No other post-op concerns Checking post-op CBC as this wasn't done in hospital  Chronic hepatitis C without hepatic coma ([place]) Continue epclusa; stressed daily adherence and NO SKIPPING DOSES Follow up with Dr. Lucianne Lei dam - patient will call to schedule -     COMPLETE METABOLIC PANEL WITH GFR  IVDU (intravenous drug user) He reports has not used in 6-8 months No controlled substances will be prescribed by this office; he is aware and understands  Medication management -     CBC with Differential/Platelet -     COMPLETE METABOLIC PANEL WITH GFR  Smoker unmotivated to quit Discussed risks associated with tobacco use and advised to reduce or quit Patient is not ready to do so, but advised to consider strongly Declines medications Resources and information provided Will follow up at the next visit    All medications were reviewed with patient and family and fully reconciled. All questions answered fully, and patient and family members were encouraged to call the office with any further questions or concerns. Discussed goal to avoid readmission related to this diagnosis.   Medications Discontinued During This Encounter  Medication Reason  . citalopram (CELEXA) 10 MG tablet Patient Preference  . lidocaine (LIDODERM) 5 %   . citalopram (CELEXA) 40 MG tablet Reorder   Over 40 minutes of exam, counseling,  chart review, and complex, high/moderate level critical decision making was performed this visit.   Future Appointments  Date Time Provider [place]  05/15/2019  3:30 PM Unk Pinto, MD [facility] None  10/30/2019  3:00 PM Unk Pinto, MD [facility] None    HPI BP 108/68   Pulse 83   Temp 97.9 F (36.6 C)   Wt 226 lb (102.5 kg)   SpO2 97%   BMI 30.65 kg/m   58 y.o.male presents for follow up for transition from recent hospitalization or SNIF stay. Admit date to the hospital was 02/01/19, patient was discharged from the hospital on 02/05/19 and our clinical staff contacted the office the day after discharge to set up a follow up appointment. The discharge summary, medications, and diagnostic test results were reviewed before meeting with the patient. The patient was admitted for:    Admission Diagnoses: thoracic diskitis/ stenosis             Discharge Diagnoses: thoracic diskitis/ stenosis, thoracolumbar decompression/ fusion  Per discharge note:   HPI: 58 y.o. male patient medical history significant ofhypertension, hyperlipidemia, prediabetes, polytrauma years ago, chronic pain syndrome, IV drug use, chronic hep C who was admitted in May 2020 due to discitis/osteomyelitis at T10-T12 with right paraspinal phlegmon. Blood culture showed MRSA. Anaerobic/aerobic culture also showed MRSA. ID was consulted and followed. Started  on vancomycin. ID decided to discharge him on Zyvox with plan to follow-up with ID as an outpatient. He has also been on epclusa via ID for hep C but some concern with adherence.   Hospital Course: The patient was  admitted on 02/01/2019 and taken to the operating room where the patient underwent a planned thoracolumbar decompression and fusion. The patient tolerated the procedure well and was taken to the recovery room and then to the floor in stable condition. The hospital course was routine. There were no complications. The wound remained clean  dry and intact. Pt had appropriate back soreness. No complaints of leg pain or new N/T/W. The patient remained afebrile with stable vital signs, and tolerated a regular diet. The patient continued to increase activities, and pain was well controlled with oral pain medications. He was restarted on doxycycline 100 mg BID which was held prior to surgery, continues with epclusa.  PRE-OPERATIVE DIAGNOSIS:  1.  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 Decompressive thoracic laminectomy with medial facetectomy T10 and T11  2.  Segmental posterior fixation T8-L2 using Alphatec pedicle screws.  4. Intertransverse arthrodesis T7-L2 using morcellized autograft and allograft.   Consults: ID  Component     Latest Ref Rng & Units 02/04/2019  HCV Quantitative     >50 IU/mL HCV Not Detected   Lab Results  Component Value Date   WBC 7.6 01/29/2019   HGB 13.3 01/29/2019   HCT 41.2 01/29/2019   MCV 89.4 01/29/2019   PLT 379 01/29/2019   Lab Results  Component Value Date   NA 135 01/29/2019   K 4.6 01/29/2019   CL 100 01/29/2019   CO2 25 01/29/2019   GLUCOSE 98 01/29/2019   BUN 10 01/29/2019   CREATININE 0.75 01/29/2019   CALCIUM 9.4 01/29/2019   GFRAA >60 01/29/2019   GFRNONAA >60 01/29/2019   Lab Results  Component Value Date   ALT 14 01/29/2019   AST 19 01/29/2019   ALKPHOS 80 01/29/2019   BILITOT 0.6 01/29/2019     BP 108/68   Pulse 83   Temp 97.9 F (36.6 C)   Wt 226 lb (102.5 kg)   SpO2 97%   BMI 30.65 kg/m   Follow up 02/11/2019  He reports today he is doing fairly, pain has been moderate; he is following with Dr. Ronnald Ramp his neurologist is managing due to hx of IV drug use. He is taking gabapentin 300 mg TID and percocet via neuro. Takes ibuprofen 200 mg ~4 tabs daily. He also reports he has been using CBD oil drops which he reports  has been helpful with pain and appetite.   He did well with rehab in hospital and reports wasn't recommended after discharge. He is wearing back brace and doing well, no difficulty ambulating. He is sleeping in recliner since prior to surgery but plans to start transition back to bed. He reports significantly improved ability to stand for extended periods.   He reports has been taking miralax daily, took some MOM a few days ago and has done well since.   He has been checking wound daily, mother who he lives with and has been caring for has been helping.    He was discharged on previous doxycycline, 100 mg BID, he reports on epclusa day 35, reports hasn't missed doses recently.   He reports last drug use was 6-8 months ago. He feels he has done better not being out and around, not seeing friends.   He reports he does not have scheduled appointment with Dr. Tommy Medal but has # to call and plans to do so  within the next month.   Neuro follow up with Dr. Ronnald Ramp this Thursday (02/14/2019).   he currently continues to smoke 1/2 pack a day, down from a pack/day; discussed risks associated with smoking, patient is not ready to quit ("I will smoke until the day I die")but is actively cut back; declines medication.  His blood pressure has been controlled at home, today their BP is BP: 108/68  He does not workout. He denies chest pain, shortness of breath, dizziness. Does have some fatigue, wonders about post op anemia.     Significant Diagnostic Studies:        Results for orders placed or performed during the hospital encounter of 02/01/19  Aerobic/Anaerobic Culture (surgical/deep wound)   Specimen: Soft Tissue, Other  Result Value Ref Range   Specimen Description ABSCESS    Special Requests THORACIC    Gram Stain      FEW WBC PRESENT, PREDOMINANTLY PMN NO ORGANISMS SEEN    Culture      NO GROWTH 3 DAYS NO ANAEROBES ISOLATED; CULTURE IN PROGRESS FOR 5 DAYS Performed at [facility] [address] [place], [place] [address]    Report Status PENDING   C-reactive protein  Result Value Ref Range   CRP 15.8 (H) <1.0 mg/dL    Home health is not involved.   Images while in the hospital:  Dg Lumbar Spine 2-3 Views  Result Date: 02/01/2019 CLINICAL DATA:  T10-L2 spinal fusion EXAM: DG C-ARM 1-60 MIN; LUMBAR SPINE - 2-3 VIEW COMPARISON:  Rib radiographs November 08, 2018, same-day fluoroscopy, MRI December 26, 2018, December 18, 2018 FINDINGS: Fluoroscopic images appear to demonstrate a T10-L2 lumbar spinal fusion though exact levels are difficult to discern given narrow collimation of the fluoroscope. Additional fracture metallic wires are unchanged from comparison studies. No unexpected radiopaque foreign bodies are identified. IMPRESSION: T10-L2 lumbar spinal fusion, as above. Electronically Signed   By: Lovena Le M.D.   On: 02/01/2019 19:54   Dg C-arm 1-60 Min  Result Date: 02/01/2019 CLINICAL DATA:  T10-L2 spinal fusion EXAM: DG C-ARM 1-60 MIN; LUMBAR SPINE - 2-3 VIEW COMPARISON:  Rib radiographs November 08, 2018, same-day fluoroscopy, MRI December 26, 2018, December 18, 2018 FINDINGS: Fluoroscopic images appear to demonstrate a T10-L2 lumbar spinal fusion though exact levels are difficult to discern given narrow collimation of the fluoroscope. Additional fracture metallic wires are unchanged from comparison studies. No unexpected radiopaque foreign bodies are identified. IMPRESSION: T10-L2 lumbar spinal fusion, as above. Electronically Signed   By: Lovena Le M.D.   On: 02/01/2019 19:54          Current Outpatient Medications (Cardiovascular):  .  lisinopril-hydrochlorothiazide (ZESTORETIC) 20-25 MG tablet, Take 1 tablet Daily for BP (Patient taking differently: Take 0.5 tablets by mouth daily with lunch. ) .  rosuvastatin (CRESTOR) 40 MG tablet, Take 1/2 to 1 tablet daily or as directed for Cholesterol (Patient not taking: Reported on 01/23/2019)  Current  Outpatient Medications (Respiratory):  .  albuterol (PROVENTIL HFA;VENTOLIN HFA) 108 (90 Base) MCG/ACT inhaler, INHALE 2 PUFFS BY MOUTH EVERY 4 HOURS AS NEEDED FOR WHEEZING AND FOR SHORTNESS OF BREATH (Patient taking differently: Inhale 2 puffs into the lungs every 4 (four) hours as needed for wheezing or shortness of breath. )  Current Outpatient Medications (Analgesics):  .  aspirin EC 325 MG tablet, Take 325 mg by mouth daily.  Marland Kitchen  ibuprofen (ADVIL,MOTRIN) 200 MG tablet, Take 800-1,200 mg by mouth daily after lunch.  Marland Kitchen  oxyCODONE-acetaminophen (PERCOCET) 10-325 MG tablet, Take 1 tablet by mouth every 6 (six) hours as needed for pain.   Current Outpatient Medications (Other):  .  amitriptyline (ELAVIL) 150 MG tablet, Take 1 tablet at Bedtime for Sleep & Mood (Patient taking differently: Take 150 mg by mouth at bedtime. ) .  carisoprodol (SOMA) 350 MG tablet, Take 350 mg by mouth every 6 (six) hours as needed for muscle spasms.  .  cholecalciferol (VITAMIN D3) 25 MCG (1000 UT) tablet, Take 5,000 Units by mouth daily. .  citalopram (CELEXA) 20 MG tablet, Take 1 tablet (20 mg total) by mouth daily with lunch. .  doxycycline (VIBRA-TABS) 100 MG tablet, Take 1 tablet (100 mg total) by mouth every 12 (twelve) hours. .  gabapentin (NEURONTIN) 300 MG capsule, TAKE 1 CAPSULE BY MOUTH THREE TIMES DAILY AS NEEDED FOR PAIN .  Menthol-Camphor (ICY HOT ADVANCED RELIEF) 16-11 % CREA, Apply 1 application topically 4 (four) times daily as needed (pain).  .  polyethylene glycol (MIRALAX / GLYCOLAX) 17 g packet, Take 17 g by mouth daily as needed. (Patient taking differently: Take 17 g by mouth daily as needed for mild constipation. ) .  Sofosbuvir-Velpatasvir (EPCLUSA) 400-100 MG TABS, Take 1 tablet by mouth daily.  Past Medical History:  Diagnosis Date  . Anxiety 10/25/2018  . Arthritis   . Bipolar 1 disorder ([place])   . Depression   . Fracture, ribs 1993   ruptured stomach, left lung resulted from industrial  fall  . Heart murmur    as a child  . Hepatitis C   . History of kidney stones   . Hypertension   . Kidney stones   . Vertebral osteomyelitis ([place]) 10/25/2018     Allergies  Allergen Reactions  . Codeine Itching  . Dilaudid [Hydromorphone Hcl] Itching  . Shellfish Allergy Diarrhea and Nausea And Vomiting    ROS: all negative except above.   Physical Exam: Filed Weights   02/11/19 1152  Weight: 226 lb (102.5 kg)   BP 108/68   Pulse 83   Temp 97.9 F (36.6 C)   Wt 226 lb (102.5 kg)   SpO2 97%   BMI 30.65 kg/m  General Appearance: Well nourished, in no apparent distress. Eyes: PERRLA, EOMs, conjunctiva no swelling or erythema Sinuses: No Frontal/maxillary tenderness ENT/Mouth: Ext aud canals clear, TMs without erythema, bulging. No erythema, swelling, or exudate on post pharynx.  Tonsils not swollen or erythematous. Hearing normal.  Neck: Supple, thyroid normal.  Respiratory: Respiratory effort normal, BS equal bilaterally without rales, rhonchi, wheezing or stridor.  Cardio: RRR with no MRGs. Brisk peripheral pulses without edema.  Abdomen: Soft, abdominal obesity, + BS.  Non tender, no guarding, rebound, hernias, masses. Lymphatics: Non tender without lymphadenopathy.  Musculoskeletal: Back in brace, LE strength symmetrical, normal slow/steady gait.  Skin: Warm, dry without rashes, lesions, ecchymosis. Surgical wound approx 10 inches over spine healing fairly with steri strips coming off, mild erythema at very base with scant purulent discharge at edges, no fluctuance or significant tenderness Neuro:  Normal muscle tone, Sensation intact.  Psych: Awake and oriented X 3, normal affect, Insight and Judgment appropriate.     Izora Ribas, NP 1:00 PM [facility]

## 2019-02-07 NOTE — Telephone Encounter (Signed)
Called patient on 02/07/2019 , 12:15 PM in an attempt to reach the patient for a hospital follow up.   Admit date: 02/01/19 Discharge: 02/05/19   He does not have any questions or concerns about medications from the hospital admission. The patient's medications were reviewed over the phone, they were counseled to bring in all current medications to the hospital follow up visit.   I advised the patient to call if any questions or concerns arise about the hospital admission or medications    Home health was not started in the hospital. He states he achieved his physical therapy goals in the hospital and no inhome PT needed. All questions were answered and a follow up appointment was made. Patient has an office visit with Caryl Pina Corbett,NP on 02/11/2019 at 11:30.  Prior to Admission medications   Medication Sig Start Date End Date Taking? Authorizing Provider  albuterol (PROVENTIL HFA;VENTOLIN HFA) 108 (90 Base) MCG/ACT inhaler INHALE 2 PUFFS BY MOUTH EVERY 4 HOURS AS NEEDED FOR WHEEZING AND FOR SHORTNESS OF BREATH Patient taking differently: Inhale 2 puffs into the lungs every 4 (four) hours as needed for wheezing or shortness of breath.  08/21/17   Unk Pinto, MD  amitriptyline (ELAVIL) 150 MG tablet Take 1 tablet at Bedtime for Sleep & Mood Patient taking differently: Take 150 mg by mouth at bedtime.  09/23/18   Unk Pinto, MD  aspirin EC 325 MG tablet Take 325 mg by mouth daily.     [provider]  carisoprodol (SOMA) 350 MG tablet Take 350 mg by mouth every 6 (six) hours as needed for muscle spasms.     [provider]  cholecalciferol (VITAMIN D3) 25 MCG (1000 UT) tablet Take 5,000 Units by mouth daily.    [provider]  citalopram (CELEXA) 10 MG tablet Take 1 tablet (10 mg total) by mouth daily. Patient not taking: Reported on 01/23/2019 10/16/18   Shelly Coss, MD  citalopram (CELEXA) 40 MG tablet Take 20 mg by mouth daily with lunch.    [provider]  doxycycline (VIBRA-TABS) 100 MG tablet Take 1 tablet (100 mg total) by mouth every 12 (twelve) hours. 02/05/19   Eustace Moore, MD  gabapentin (NEURONTIN) 300 MG capsule TAKE 1 CAPSULE BY MOUTH THREE TIMES DAILY AS NEEDED FOR PAIN Patient taking differently: Take 300 mg by mouth 3 (three) times daily as needed (pain).  12/05/18   Liane Comber, NP  ibuprofen (ADVIL,MOTRIN) 200 MG tablet Take 800-1,200 mg by mouth daily after lunch.     [provider]  lidocaine (LIDODERM) 5 % Place 1 patch onto the skin daily. Remove & Discard patch within 12 hours or as directed by MD Patient not taking: Reported on 01/23/2019 11/29/18   Rodell Perna A, PA-C  lisinopril-hydrochlorothiazide (ZESTORETIC) 20-25 MG tablet Take 1 tablet Daily for BP Patient taking differently: Take 0.5 tablets by mouth daily with lunch.  11/19/18   Unk Pinto, MD  Menthol-Camphor (ICY HOT ADVANCED RELIEF) 16-11 % CREA Apply 1 application topically 4 (four) times daily as needed (pain).     [provider]  oxyCODONE-acetaminophen (PERCOCET) 10-325 MG tablet Take 1 tablet by mouth every 6 (six) hours as needed for pain. 02/05/19   Eustace Moore, MD  polyethylene glycol (MIRALAX / GLYCOLAX) 17 g packet Take 17 g by mouth daily as needed. Patient taking differently: Take 17 g by mouth daily as needed for mild constipation.  10/15/18   Shelly Coss, MD  rosuvastatin ([place])  40 MG tablet Take 1/2 to 1 tablet daily or as directed for Cholesterol Patient not taking: Reported on 01/23/2019 09/01/17   Unk Pinto, MD  Sofosbuvir-Velpatasvir (EPCLUSA) 400-100 MG TABS Take 1 tablet by mouth daily. 11/06/18   Truman Hayward, MD

## 2019-02-11 ENCOUNTER — Other Ambulatory Visit: Payer: Self-pay

## 2019-02-11 ENCOUNTER — Ambulatory Visit (INDEPENDENT_AMBULATORY_CARE_PROVIDER_SITE_OTHER): Payer: Medicare Other | Admitting: Adult Health

## 2019-02-11 ENCOUNTER — Encounter: Payer: Self-pay | Admitting: Adult Health

## 2019-02-11 VITALS — BP 108/68 | HR 83 | Temp 97.9°F | Wt 226.0 lb

## 2019-02-11 DIAGNOSIS — Z79899 Other long term (current) drug therapy: Secondary | ICD-10-CM

## 2019-02-11 DIAGNOSIS — M4804 Spinal stenosis, thoracic region: Secondary | ICD-10-CM | POA: Diagnosis not present

## 2019-02-11 DIAGNOSIS — B182 Chronic viral hepatitis C: Secondary | ICD-10-CM

## 2019-02-11 DIAGNOSIS — M4644 Discitis, unspecified, thoracic region: Secondary | ICD-10-CM | POA: Diagnosis not present

## 2019-02-11 DIAGNOSIS — M4325 Fusion of spine, thoracolumbar region: Secondary | ICD-10-CM

## 2019-02-11 DIAGNOSIS — F1990 Other psychoactive substance use, unspecified, uncomplicated: Secondary | ICD-10-CM

## 2019-02-11 DIAGNOSIS — F17200 Nicotine dependence, unspecified, uncomplicated: Secondary | ICD-10-CM

## 2019-02-11 MED ORDER — CITALOPRAM HYDROBROMIDE 20 MG PO TABS: 20.0000 mg | ORAL_TABLET | Freq: Every day | ORAL | 1 refills | Status: DC

## 2019-02-11 NOTE — Patient Instructions (Addendum)
  Please schedule a follow up with Dr. Tommy Medal   Monitor blood pressure closely and call if having lots of low values call me  - Please monitor your blood pressure. If it is getting below 130/80 AND you are having fatigue with exertion, dizziness we may need to cut your blood pressure medication in half. Please call the office if this is happening.     SMOKING CESSATION  [employer]  [phone] for more information or for a free program for smoking cessation help.   You can call QUIT SMART 1-800-QUIT-NOW for free nicotine patches or replacement therapy- if they are out- keep calling  [facility] [facility] Can call for smoking cessation classes, [phone]  If you have a smart phone, please look up Smoke Free app, this will help you stay on track and give you information about money you have saved, life that you have gained back and a ton of more information.     ADVANTAGES OF QUITTING SMOKING  Within 20 minutes, blood pressure decreases. Your pulse is at normal level.  After 8 hours, carbon monoxide levels in the blood return to normal. Your oxygen level increases.  After 24 hours, the chance of having a heart attack starts to decrease. Your breath, hair, and body stop smelling like smoke.  After 48 hours, damaged nerve endings begin to recover. Your sense of taste and smell improve.  After 72 hours, the body is virtually free of nicotine. Your bronchial tubes relax and breathing becomes easier.  After 2 to 12 weeks, lungs can hold more air. Exercise becomes easier and circulation improves.  After 1 year, the risk of coronary heart disease is cut in half.  After 5 years, the risk of stroke falls to the same as a nonsmoker.  After 10 years, the risk of lung cancer is cut in half and the risk of other cancers decreases significantly.  After 15 years, the risk of coronary heart disease drops, usually to the level of a nonsmoker.  You will have extra money  to spend on things other than cigarettes.

## 2019-02-12 ENCOUNTER — Encounter: Payer: Self-pay | Admitting: Adult Health

## 2019-02-12 ENCOUNTER — Other Ambulatory Visit: Payer: Self-pay | Admitting: Adult Health

## 2019-02-12 DIAGNOSIS — E875 Hyperkalemia: Secondary | ICD-10-CM

## 2019-02-12 DIAGNOSIS — D62 Acute posthemorrhagic anemia: Secondary | ICD-10-CM | POA: Insufficient documentation

## 2019-02-12 LAB — CBC WITH DIFFERENTIAL/PLATELET
Absolute Monocytes: 767 cells/uL (ref 200–950)
Basophils Absolute: 78 cells/uL (ref 0–200)
Basophils Relative: 1.1 %
Eosinophils Absolute: 142 cells/uL (ref 15–500)
Eosinophils Relative: 2 %
HCT: 31.6 % — ABNORMAL LOW (ref 38.5–50.0)
Hemoglobin: 10.4 g/dL — ABNORMAL LOW (ref 13.2–17.1)
Lymphs Abs: 2109 cells/uL (ref 850–3900)
MCH: 28.7 pg (ref 27.0–33.0)
MCHC: 32.9 g/dL (ref 32.0–36.0)
MCV: 87.3 fL (ref 80.0–100.0)
MPV: 9.2 fL (ref 7.5–12.5)
Monocytes Relative: 10.8 %
Neutro Abs: 4004 cells/uL (ref 1500–7800)
Neutrophils Relative %: 56.4 %
Platelets: 496 10*3/uL — ABNORMAL HIGH (ref 140–400)
RBC: 3.62 10*6/uL — ABNORMAL LOW (ref 4.20–5.80)
RDW: 13.5 % (ref 11.0–15.0)
Total Lymphocyte: 29.7 %
WBC: 7.1 10*3/uL (ref 3.8–10.8)

## 2019-02-12 LAB — COMPLETE METABOLIC PANEL WITH GFR
AG Ratio: 1.1 (calc) (ref 1.0–2.5)
ALT: 9 U/L (ref 9–46)
AST: 15 U/L (ref 10–35)
Albumin: 3.8 g/dL (ref 3.6–5.1)
Alkaline phosphatase (APISO): 99 U/L (ref 35–144)
BUN: 14 mg/dL (ref 7–25)
CO2: 27 mmol/L (ref 20–32)
Calcium: 9.8 mg/dL (ref 8.6–10.3)
Chloride: 101 mmol/L (ref 98–110)
Creat: 0.85 mg/dL (ref 0.70–1.33)
GFR, Est African American: 112 mL/min/{1.73_m2} (ref >=60)
GFR, Est Non African American: 97 mL/min/{1.73_m2} (ref >=60)
Globulin: 3.6 g/dL (calc) (ref 1.9–3.7)
Glucose, Bld: 94 mg/dL (ref 65–99)
Potassium: 5.5 mmol/L — ABNORMAL HIGH (ref 3.5–5.3)
Sodium: 135 mmol/L (ref 135–146)
Total Bilirubin: 0.3 mg/dL (ref 0.2–1.2)
Total Protein: 7.4 g/dL (ref 6.1–8.1)

## 2019-02-16 ENCOUNTER — Other Ambulatory Visit: Payer: Self-pay | Admitting: Internal Medicine

## 2019-02-18 ENCOUNTER — Ambulatory Visit: Payer: Medicare Other | Admitting: Pharmacist

## 2019-02-20 ENCOUNTER — Encounter: Payer: Self-pay | Admitting: Infectious Disease

## 2019-02-20 ENCOUNTER — Ambulatory Visit (INDEPENDENT_AMBULATORY_CARE_PROVIDER_SITE_OTHER): Payer: Medicare Other | Admitting: Infectious Disease

## 2019-02-20 ENCOUNTER — Other Ambulatory Visit: Payer: Self-pay

## 2019-02-20 ENCOUNTER — Telehealth: Payer: Self-pay

## 2019-02-20 VITALS — BP 99/67 | HR 101 | Temp 98.6°F

## 2019-02-20 DIAGNOSIS — B182 Chronic viral hepatitis C: Secondary | ICD-10-CM

## 2019-02-20 DIAGNOSIS — I952 Hypotension due to drugs: Secondary | ICD-10-CM

## 2019-02-20 DIAGNOSIS — Z23 Encounter for immunization: Secondary | ICD-10-CM | POA: Diagnosis not present

## 2019-02-20 DIAGNOSIS — M4620 Osteomyelitis of vertebra, site unspecified: Secondary | ICD-10-CM | POA: Diagnosis not present

## 2019-02-20 DIAGNOSIS — F1990 Other psychoactive substance use, unspecified, uncomplicated: Secondary | ICD-10-CM | POA: Diagnosis not present

## 2019-02-20 DIAGNOSIS — B9562 Methicillin resistant Staphylococcus aureus infection as the cause of diseases classified elsewhere: Secondary | ICD-10-CM

## 2019-02-20 DIAGNOSIS — R7881 Bacteremia: Secondary | ICD-10-CM

## 2019-02-20 DIAGNOSIS — M8628 Subacute osteomyelitis, other site: Secondary | ICD-10-CM

## 2019-02-20 DIAGNOSIS — I959 Hypotension, unspecified: Secondary | ICD-10-CM

## 2019-02-20 HISTORY — DX: Hypotension, unspecified: I95.9

## 2019-02-20 MED ORDER — DOXYCYCLINE HYCLATE 100 MG PO TABS: 100.0000 mg | ORAL_TABLET | Freq: Two times a day (BID) | ORAL | 3 refills | Status: DC

## 2019-02-20 NOTE — Progress Notes (Signed)
Subjective:   Chief complaint: low back pain   Patient ID: Howard Fox, male    DOB: [birth year], 58 y.o.   MRN: [id number]  HPI  58 y.o. male with  T 10-11 diskitis, osteomyelitis and now found to have MRSA bacteremia. 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  We treated him with IV vancomycin in house. TTE was negative. TEE not done. I changed him to zyvox adjusting his celexa down at same time. He is nearing week 2 end of zyvox and doing welll Back pain improving.   We did check his CBC and it was fine on Zyvox and continued additional 2 weeks of Zyvox with plans to change him over doxycycline midst of taking the Zyvox therapy.  Started treating him for hepatitis C but his hepatitis C treatment was interrupted due to communication between himself and his neurosurgeon and misunderstanding that he needed to stop the medication.  He then restarted that viral load did come down but I am worried that he may have developed resistance to this therapy.  In the interim he underwent neurosurgery with Dr. Sherley Bounds.  .  On February 01, 2019 Dr. Wynetta Emery was operating room due to persistent T10-T11 discitis with the space destruction and kyphosis.  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  Patient underwent decompressive thoracic laminectomy and medial facetectomy Segmental posterior fixation T8-L2 using Alphatec pedicle screws.  4. Intertransverse arthrodesis T7-L2 using morcellized autograft and allograft.   He was seen by my partner Dr. Prince Rome in the hospital.  Cultures failed to yield any organism while he was in the hospital and has been continued on doxycycline since then.  Has been having a low blood pressure which I suspect is due to the fact that his hypertension was really due to pain and now that his pain  is better controlled he is now no longer need of his combination hydrochlorothiazide ACE inhibitor.  He also has had some dizziness which I suspect is due to this to although he was associating it with taking Celexa.  Turns out that his cessation of his Raeanne Gathers occurred in the context of taking multiple high-dose ibuprofen tablets today such as "20" which then caused acid reflux for which he took medications for which then led him to agglutination stop his Epclusa.  He missed about 12 days in total.  Fortunately his hep C viral load was most recently undetectable but I still have high concern for virological failure  His back pain is much improved compared to few weeks ago.   Past Medical History:  Diagnosis Date  . Anxiety 10/25/2018  . Arthritis   . Bipolar 1 disorder ([place])   . Depression   . Fracture, ribs 1993   ruptured stomach, left lung resulted from industrial fall  . Heart murmur    as a child  . Hepatitis C   . History of kidney stones   . Hypertension   . Kidney stones   . Vertebral osteomyelitis ([place]) 10/25/2018    Past Surgical History:  Procedure Laterality Date  . CHOLECYSTECTOMY    . LAMINECTOMY WITH POSTERIOR LATERAL ARTHRODESIS LEVEL 4 N/A 02/01/2019   Procedure: Laminectomy and Foraminotomy - Thoracic ten-Thoracic eleven, Instrumented fusion Thoracic eight-Lumbar two;  Surgeon: Eustace Moore, MD;  Location: [facility];  Service:  Neurosurgery;  Laterality: N/A;  . laser surgery for kidney stones Left   . LITHOTRIPSY      Family History  Problem Relation Age of Onset  . Mental illness Mother        Bipolar  . Stroke Father   . Arthritis Father       Social History   Socioeconomic History  . Marital status: Divorced    Spouse name: Not on file  . Number of children: Not on file  . Years of education: Not on file  . Highest education level: Not on file  Occupational History  . Not on file  Social Needs  . Financial resource strain: Not on file  . Food  insecurity    Worry: Not on file    Inability: Not on file  . Transportation needs    Medical: Not on file    Non-medical: Not on file  Tobacco Use  . Smoking status: Current Every Day Smoker    Packs/day: 0.50    Years: 42.00    Pack years: 21.00    Types: Cigarettes    Start date: 01/02/1977  . Smokeless tobacco: Never Used  Substance and Sexual Activity  . Alcohol use: Yes    Comment: 2 beers a day  . Drug use: Yes    Frequency: 1.0 times per week    Types: Marijuana    Comment: last time 2 weeks ago  . Sexual activity: Not on file  Lifestyle  . Physical activity    Days per week: Not on file    Minutes per session: Not on file  . Stress: Not on file  Relationships  . Social Herbalist on phone: Not on file    Gets together: Not on file    Attends religious service: Not on file    Active member of club or organization: Not on file    Attends meetings of clubs or organizations: Not on file    Relationship status: Not on file  Other Topics Concern  . Not on file  Social History Narrative  . Not on file    Allergies  Allergen Reactions  . Codeine Itching  . Dilaudid [Hydromorphone Hcl] Itching  . Shellfish Allergy Diarrhea and Nausea And Vomiting     Current Outpatient Medications:  .  albuterol (VENTOLIN HFA) 108 (90 Base) MCG/ACT inhaler, Use 2 Inhalations 15 minutes apart every 4 hours to Rescue Asthma, Disp: 48 g, Rfl: 3 .  amitriptyline (ELAVIL) 150 MG tablet, Take 1 tablet at Bedtime for Sleep & Mood (Patient taking differently: Take 150 mg by mouth at bedtime. ), Disp: 90 tablet, Rfl: 1 .  aspirin EC 325 MG tablet, Take 325 mg by mouth daily. , Disp: , Rfl:  .  carisoprodol (SOMA) 350 MG tablet, Take 350 mg by mouth every 6 (six) hours as needed for muscle spasms. , Disp: , Rfl:  .  cholecalciferol (VITAMIN D3) 25 MCG (1000 UT) tablet, Take 5,000 Units by mouth daily., Disp: , Rfl:  .  citalopram (CELEXA) 20 MG tablet, Take 1 tablet (20 mg total)  by mouth daily with lunch., Disp: 90 tablet, Rfl: 1 .  doxycycline (VIBRA-TABS) 100 MG tablet, Take 1 tablet (100 mg total) by mouth every 12 (twelve) hours., Disp: 60 tablet, Rfl: 0 .  gabapentin (NEURONTIN) 300 MG capsule, TAKE 1 CAPSULE BY MOUTH THREE TIMES DAILY AS NEEDED FOR PAIN, Disp: 90 capsule, Rfl: 0 .  ibuprofen (ADVIL,MOTRIN) 200 MG tablet,  Take 800-1,200 mg by mouth daily after lunch. , Disp: , Rfl:  .  lisinopril-hydrochlorothiazide (ZESTORETIC) 20-25 MG tablet, Take 1 tablet Daily for BP (Patient taking differently: Take 0.5 tablets by mouth daily with lunch. ), Disp: 90 tablet, Rfl: 1 .  Menthol-Camphor (ICY HOT ADVANCED RELIEF) 16-11 % CREA, Apply 1 application topically 4 (four) times daily as needed (pain). , Disp: , Rfl:  .  oxyCODONE-acetaminophen (PERCOCET) 10-325 MG tablet, Take 1 tablet by mouth every 6 (six) hours as needed for pain., Disp: 30 tablet, Rfl: 0 .  polyethylene glycol (MIRALAX / GLYCOLAX) 17 g packet, Take 17 g by mouth daily as needed. (Patient taking differently: Take 17 g by mouth daily as needed for mild constipation. ), Disp: 14 each, Rfl: 0 .  rosuvastatin (CRESTOR) 40 MG tablet, Take 1/2 to 1 tablet daily or as directed for Cholesterol (Patient not taking: Reported on 01/23/2019), Disp: 90 tablet, Rfl: 1 .  Sofosbuvir-Velpatasvir (EPCLUSA) 400-100 MG TABS, Take 1 tablet by mouth daily., Disp: 28 tablet, Rfl: 2    Review of Systems  Constitutional: Negative for chills and fever.  HENT: Negative for congestion and sore throat.   Eyes: Negative for photophobia.  Respiratory: Negative for cough, shortness of breath and wheezing.   Cardiovascular: Negative for chest pain, palpitations and leg swelling.  Gastrointestinal: Negative for abdominal pain, blood in stool, constipation, diarrhea, nausea and vomiting.  Genitourinary: Negative for dysuria, flank pain and hematuria.  Musculoskeletal: Positive for back pain. Negative for myalgias.  Skin: Negative for  rash.  Neurological: Negative for dizziness, weakness and headaches.  Hematological: Does not bruise/bleed easily.  Psychiatric/Behavioral: Negative for suicidal ideas.       Objective:   Physical Exam Constitutional:      General: He is not in acute distress.    Appearance: Normal appearance. He is well-developed. He is not ill-appearing or diaphoretic.  HENT:     Head: Normocephalic and atraumatic.     Right Ear: Hearing and external ear normal.     Left Ear: Hearing and external ear normal.     Nose: No nasal deformity or rhinorrhea.  Eyes:     General: No scleral icterus.    Extraocular Movements: Extraocular movements intact.     Conjunctiva/sclera: Conjunctivae normal.     Right eye: Right conjunctiva is not injected.     Left eye: Left conjunctiva is not injected.  Neck:     Musculoskeletal: Normal range of motion and neck supple.     Vascular: No JVD.  Cardiovascular:     Rate and Rhythm: Normal rate and regular rhythm.     Heart sounds: S1 normal and S2 normal.  Pulmonary:     Effort: Pulmonary effort is normal. No respiratory distress.     Breath sounds: No wheezing.  Abdominal:     General: There is no distension.     Palpations: Abdomen is soft.  Musculoskeletal: Normal range of motion.     Right shoulder: Normal.     Left shoulder: Normal.     Right hip: Normal.     Left hip: Normal.     Right knee: Normal.     Left knee: Normal.  Lymphadenopathy:     Head:     Right side of head: No submandibular, preauricular or posterior auricular adenopathy.     Left side of head: No submandibular, preauricular or posterior auricular adenopathy.     Cervical: No cervical adenopathy.     Right cervical: No  superficial or deep cervical adenopathy.    Left cervical: No superficial or deep cervical adenopathy.  Skin:    General: Skin is warm and dry.     Coloration: Skin is not pale.     Findings: No abrasion, bruising, ecchymosis, erythema, lesion or rash.     Nails:  There is no clubbing.   Neurological:     General: No focal deficit present.     Mental Status: He is alert and oriented to person, place, and time.     Sensory: No sensory deficit.     Coordination: Coordination normal.     Gait: Gait normal.  Psychiatric:        Attention and Perception: He is attentive.        Speech: Speech normal.        Behavior: Behavior normal. Behavior is cooperative.        Thought Content: Thought content normal.        Judgment: Judgment normal.           Assessment & Plan:   MRSA bacteremia with diskitis: T-spine with impingement on cord requiring neurosurgery  Hardware now in place  He will need protracted course of doxycycline I would treat him for a minimum of a year.     Chronic HCV without hepatic coma:   Check a viral load today and see him back in a month and recheck labs then as well we will also check hepatitis B surface antibody titer.  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  Low blood pressure: This is likely due to his antihypertensives being given in the context of pain and now the pain is better controlled and has no need for the medications have discontinued them from his list.

## 2019-02-20 NOTE — Telephone Encounter (Addendum)
Doran Heater. Frisbee left note with CMA for Dr. Tommy Medal after his office visit with provider with the following concern:   " Why did you label me a IVDU( Intravenous Drug User) when I haven't shot dope in years [place] "FORMER IVDU"   This is unacceptable and I don't understand this is the first thing whoever is treating me SEE'S DA.Marland KitchenMarland KitchenN don't mess up other people by doing that."     Lenore Cordia, CMA

## 2019-02-21 DIAGNOSIS — Z87898 Personal history of other specified conditions: Secondary | ICD-10-CM | POA: Insufficient documentation

## 2019-02-21 DIAGNOSIS — F1991 Other psychoactive substance use, unspecified, in remission: Secondary | ICD-10-CM | POA: Insufficient documentation

## 2019-02-21 NOTE — Telephone Encounter (Signed)
Howard Fox can we change the problemm to IVDU in remission. I doubt its been years btw

## 2019-02-22 NOTE — Telephone Encounter (Signed)
[facility] Patient Advocate Encounter  FYI: We received a fax from [employer] program that shows the patient applied for Epclusa to be mailed to him. The application got held up because they need the provider's signature and lab results.  He is covered by [employer] and a [employer] so this route does not make much sense and the patient would ultimately not qualify. He received three shipments from [facility] on 06/17, 07/23 & 09/03

## 2019-02-23 LAB — COMPLETE METABOLIC PANEL WITH GFR
AG Ratio: 1.1 (calc) (ref 1.0–2.5)
ALT: 8 U/L — ABNORMAL LOW (ref 9–46)
AST: 16 U/L (ref 10–35)
Albumin: 3.9 g/dL (ref 3.6–5.1)
Alkaline phosphatase (APISO): 103 U/L (ref 35–144)
BUN: 16 mg/dL (ref 7–25)
CO2: 26 mmol/L (ref 20–32)
Calcium: 9.8 mg/dL (ref 8.6–10.3)
Chloride: 101 mmol/L (ref 98–110)
Creat: 1.01 mg/dL (ref 0.70–1.33)
GFR, Est African American: 95 mL/min/{1.73_m2} (ref >=60)
GFR, Est Non African American: 82 mL/min/{1.73_m2} (ref >=60)
Globulin: 3.4 g/dL (calc) (ref 1.9–3.7)
Glucose, Bld: 94 mg/dL (ref 65–99)
Potassium: 4.5 mmol/L (ref 3.5–5.3)
Sodium: 135 mmol/L (ref 135–146)
Total Bilirubin: 0.4 mg/dL (ref 0.2–1.2)
Total Protein: 7.3 g/dL (ref 6.1–8.1)

## 2019-02-23 LAB — CBC WITH DIFFERENTIAL/PLATELET
Absolute Monocytes: 634 cells/uL (ref 200–950)
Basophils Absolute: 70 cells/uL (ref 0–200)
Basophils Relative: 0.8 %
Eosinophils Absolute: 185 cells/uL (ref 15–500)
Eosinophils Relative: 2.1 %
HCT: 32.3 % — ABNORMAL LOW (ref 38.5–50.0)
Hemoglobin: 10.7 g/dL — ABNORMAL LOW (ref 13.2–17.1)
Lymphs Abs: 2367 cells/uL (ref 850–3900)
MCH: 28.8 pg (ref 27.0–33.0)
MCHC: 33.1 g/dL (ref 32.0–36.0)
MCV: 87.1 fL (ref 80.0–100.0)
MPV: 9.6 fL (ref 7.5–12.5)
Monocytes Relative: 7.2 %
Neutro Abs: 5544 cells/uL (ref 1500–7800)
Neutrophils Relative %: 63 %
Platelets: 410 10*3/uL — ABNORMAL HIGH (ref 140–400)
RBC: 3.71 10*6/uL — ABNORMAL LOW (ref 4.20–5.80)
RDW: 13.9 % (ref 11.0–15.0)
Total Lymphocyte: 26.9 %
WBC: 8.8 10*3/uL (ref 3.8–10.8)

## 2019-02-23 LAB — C-REACTIVE PROTEIN: CRP: 24.1 mg/L — ABNORMAL HIGH (ref <8.0)

## 2019-02-23 LAB — HEPATITIS B SURFACE ANTIBODY, QUANTITATIVE: Hep B S AB Quant (Post): <5 m[IU]/mL — ABNORMAL LOW (ref >=10)

## 2019-02-23 LAB — HEPATITIS C RNA QUANTITATIVE
HCV Quantitative Log: <1.18 Log IU/mL
HCV RNA, PCR, QN: <15 IU/mL

## 2019-02-23 LAB — SEDIMENTATION RATE: Sed Rate: 70 mm/h — ABNORMAL HIGH (ref 0–20)

## 2019-02-25 ENCOUNTER — Telehealth: Payer: Self-pay | Admitting: Pharmacist

## 2019-02-25 NOTE — Telephone Encounter (Signed)
Patient recently saw Dr. Tommy Medal for vertebral osteomyelitis and chronic Hepatitis C treatment. He was prescribed 12 weeks of Epclusa but stopped after the first couple of weeks due to side effects.  He has gotten all 12 weeks from the pharmacy and at his visit last week, demanded an additional month.  I calmly explained to him that Raeanne Gathers is only approved for 12 weeks and that his insurance would definitely deny an additional month.  He demanded that he had money left on his grant, which is for co-pays only. I again explained that it is only for 12 weeks and he got angry and said he would find an additional way to get it.  We received a call today that patient reached out to [facility] and asked them to give him more medication.  He is insured, so he would not qualify for patient assistance anyway.  I told Adonis Brook to tell them to deny him any more medication right now until we assess his cure status. Sending to Dr. Tommy Medal as well.

## 2019-02-25 NOTE — Telephone Encounter (Signed)
Howard Fox

## 2019-02-26 ENCOUNTER — Ambulatory Visit: Payer: Medicare Other

## 2019-02-26 ENCOUNTER — Telehealth: Payer: Self-pay

## 2019-02-26 NOTE — Telephone Encounter (Signed)
Hep C RNA Is undetectable but we need to check labs 12 weeks AFTER he finishes treatment to know whehter he cured this or if interupption caused failure and his virus has Resistance  HIs inflammatory markers are up which is not suprising given recent bad infection

## 2019-02-26 NOTE — Telephone Encounter (Signed)
Patient called office today for lab results. Will route message to MD to review labs before relaying them to patient. [place]

## 2019-03-06 NOTE — Telephone Encounter (Signed)
This guy is unbelievable.

## 2019-03-06 NOTE — Telephone Encounter (Signed)
[facility] Patient Advocate Encounter  [employer] received a patient reported adverse event complaint today at 07:23 in relation to spinal surgery and stopping his medication because it was patient reported to be interfering with his ibuprofen and antacid. The patient wanted [employer] to know what happened and why he should receive more medication. [id number] is his profile number in the [facility] system. They wanted to know if this disruption resulted in hospitalization or a severe life-threatening adverse event. I filled out the paperwork indicating the timeline of events during his treatment and how many months he received medication and that we have informed the patient of proper protocol.

## 2019-03-06 NOTE — Telephone Encounter (Signed)
Cant he wait to see if he needs retreatment? This is not a self service restaturant. We need to make CLINICAL decisions

## 2019-03-06 NOTE — Telephone Encounter (Signed)
[facility] Patient Advocate Presenter, [employer] contacted [place] after receiving a long, involved phone call from the patient. They called to get questions answered because they said there were gaps in his story. I informed them that the patient has [employer] and [employer] available but that he has already been given his 3 months of medication from the local pharmacy. The lady at [facility] said they do have a program with Philis [employer]end[employer] that will help with copay amounts of insured patients but the presciber's approval was the ultimate deciding factor. I gave her the update that the clinic was not authorizing his personal application. She noted this information in his profile and closed the application so that the medication would not be sent from their pharmacy [facility].   Bartholomew Crews, CPhT Specialty Pharmacy Patient [facility] for [facility]ious [facility] Phone: [phone] Fax: [phone] 03/06/2019 12:09 PM

## 2019-03-07 NOTE — Telephone Encounter (Signed)
Thanks Christie!

## 2019-03-13 LAB — OPIATE, QUANTITATIVE, URINE
Codeine Urine: NEGATIVE ng/mL
Codeine: NEGATIVE
Hydrocodone: NEGATIVE
Hydrocodone: NEGATIVE ng/mL
Hydromorphone gc/ms, Urine: NEGATIVE ng/mL
Hydromorphone: NEGATIVE
Morphine, Confirm: >3000 ng/mL
Morphine: POSITIVE — AB
OXYCODONE+OXYMORPHONE UR QL SCN: NEGATIVE
Opiates: POSITIVE — AB
Oxycodone, ur: NEGATIVE ng/mL
Oxymorphone: NEGATIVE

## 2019-03-14 DIAGNOSIS — M4644 Discitis, unspecified, thoracic region: Secondary | ICD-10-CM | POA: Diagnosis not present

## 2019-03-15 ENCOUNTER — Other Ambulatory Visit: Payer: Self-pay | Admitting: Internal Medicine

## 2019-03-15 DIAGNOSIS — F3170 Bipolar disorder, currently in remission, most recent episode unspecified: Secondary | ICD-10-CM

## 2019-03-18 ENCOUNTER — Other Ambulatory Visit: Payer: Self-pay | Admitting: Adult Health

## 2019-03-18 DIAGNOSIS — R0789 Other chest pain: Secondary | ICD-10-CM

## 2019-03-25 ENCOUNTER — Ambulatory Visit: Payer: Medicare Other | Admitting: Infectious Disease

## 2019-04-04 ENCOUNTER — Encounter: Payer: Self-pay | Admitting: Adult Health

## 2019-04-04 DIAGNOSIS — I700 Atherosclerosis of aorta: Secondary | ICD-10-CM | POA: Insufficient documentation

## 2019-04-04 DIAGNOSIS — M549 Dorsalgia, unspecified: Secondary | ICD-10-CM | POA: Insufficient documentation

## 2019-04-04 NOTE — Progress Notes (Signed)
MEDICARE ANNUAL WELLNESS VISIT AND FOLLOW UP Assessment:    Encounter for annual medicare wellness visit  Atherosclerosis of aorta Per imaging, CT 09/2018 Control blood pressure, cholesterol, glucose, increase exercise.   Essential hypertension - holding meds due to hypotention, DASH diet, exercise and monitor at home. Restart 1/2 tab if consistently greater than 130/80.  - CBC with Differential/Platelet - CMP/GFR - TSH   Abnormal glucose Recent A1Cs at goal Discussed diet/exercise, weight management  Defer A1C; check CMP  Obesity - BMI 31 Obesity with co morbidities- long discussion about weight loss, diet, and exercise   Mixed hyperlipidemia -continue medications, check lipids, decrease fatty foods, increase activity.  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   Anemia  Following surgery, check iron if not improving   Over 30 minutes of exam, counseling, chart review, and critical decision making was performed Future Appointments  Date Time Provider [place]  05/15/2019  3:30 PM Unk Pinto, MD [facility] None  10/30/2019  3:00 PM Unk Pinto, MD [facility]  None    Plan:   During the course of the visit the patient was educated and counseled about appropriate screening and preventive services including:    Pneumococcal vaccine   Influenza vaccine  Prevnar 13  Td vaccine  Screening electrocardiogram  Colorectal cancer screening  Diabetes screening  Glaucoma screening  Nutrition counseling   Subjective:  Howard Fox is a 58 y.o. male who presents for Medicare Annual Wellness Visit and 3 month follow up for HTN, hyperlipidemia, prediabetes, and vitamin D Def. Has been on disability since 2008 for severe back injury.   Patient was treated for Hepatitis C in 2008-2009. He is again undergoing treatment by ID clinic on epclusa, viral down to undetectable levels at most recent check on 02/20/2019.   Has history of bipolar/mood DO, on celexa, amitriptylene, denies any bipolar manic depression episodes. He is off of xanax due to opoid prescription and IVD and cocain use.   This year he was admitted in May 2020 due to discitis/osteomyelitis at T10-T12 with right paraspinal phlegmon. Blood culture showed MRSA. Anaerobic/aerobic culture also showed MRSA. ID was consulted and followed, with iv vanc and now on protracted course of doxycycline anticipated for 1 year. He further underwent T8-L2 fusion by Dr. Ronnald Ramp in 01/2019. He is prescribed percocet by neuro.   He has hx of marijuana and cocaine use IVDU: has claimed to be clean He reports CBD use and this works well for him.   he currently continues to smoke 2-6 cigarettes a day (down from 10-12); discussed risks associated with smoking, patient is not ready to quit.   BMI is Body mass index is 31.95 kg/m., he has not been working on diet and exercise. Wt Readings from Last 3 Encounters:  04/08/19  235 lb 9.6 oz (106.9 kg)  02/11/19 226 lb (102.5 kg)  02/01/19 226 lb 3.2 oz (102.6 kg)   He has aortic atherosclerosis per Ct 10/14/2018 His blood pressure has been controlled at home,  runs 120-130/70's at home, today their BP is BP: 132/84 off of BP meds due to was running <90/60 He does workout, does yard work. He denies chest pain, shortness of breath, dizziness.   He is not on cholesterol medication (rosuvastatin 40 mg daily previously, off due to hep C treatment until recently) and denies myalgias. His cholesterol is not at goal. The cholesterol last visit was:   Lab Results  Component Value Date   CHOL 171 01/02/2019   HDL 40 01/02/2019   LDLCALC 111 (H) 01/02/2019   TRIG 101 01/02/2019   CHOLHDL 4.3 01/02/2019   He has been working on diet and exercise for hx of prediabetes (5.7% in 03/2015 and 09/2018), and denies paresthesia of the feet, polydipsia, polyuria and visual disturbances. Last A1C in the office was:  Lab Results  Component Value Date   HGBA1C 5.6 10/25/2018    He has stable CKD II monitored via this office:  Lab Results  Component Value Date   GFRNONAA 82 02/20/2019   Patient is on Vitamin D supplement.   Lab Results  Component Value Date   VD25OH 48 10/25/2018    Treated for Hep c in 2008.  Lab Results  Component Value Date   ALT 8 (L) 02/20/2019   AST 16 02/20/2019   ALKPHOS 80 01/29/2019   BILITOT 0.4 02/20/2019   He has mild anemia following surgery, taking iron supplement OTC unsure dose twice weekly with vit C:  Lab Results  Component Value Date   WBC 8.8 02/20/2019   HGB 10.7 (L) 02/20/2019   HCT 32.3 (L) 02/20/2019   MCV 87.1 02/20/2019   PLT 410 (H) 02/20/2019    Medication Review: Current Outpatient Medications on File Prior to Visit  Medication Sig Dispense Refill  . albuterol (VENTOLIN HFA) 108 (90 Base) MCG/ACT inhaler Use 2 Inhalations 15 minutes apart every 4 hours to Rescue Asthma 48 g 3  . amitriptyline (ELAVIL) 150 MG tablet TAKE 1 TABLET BY MOUTH AT BEDTIME FOR SLEEP AND MOOD 90 tablet 1  . aspirin EC 325 MG tablet Take 325 mg by mouth daily.     . carisoprodol (SOMA) 350 MG tablet Take 350 mg by mouth every 6  (six) hours as needed for muscle spasms.     . cholecalciferol (VITAMIN D3) 25 MCG (1000 UT) tablet Take 5,000 Units by mouth daily.    Marland Kitchen doxycycline (VIBRA-TABS) 100 MG tablet Take 1 tablet (100 mg total) by mouth every 12 (twelve) hours. 90 tablet 3  . gabapentin (NEURONTIN) 300 MG capsule TAKE 1 CAPSULE BY MOUTH THREE TIMES DAILY AS NEEDED FOR PAIN 90 capsule 0  . ibuprofen (ADVIL,MOTRIN) 200 MG tablet Take 800-1,200 mg by mouth daily after lunch.     . oxyCODONE-acetaminophen (PERCOCET) 10-325 MG tablet Take 1 tablet by mouth every 6 (six) hours as needed for pain. (Patient taking differently: Take 1 tablet by mouth every 6 (six) hours as needed for pain. 7.5-325 every 6 hours prn) 30 tablet 0  . polyethylene glycol (MIRALAX / GLYCOLAX) 17 g packet Take 17 g by mouth daily as needed. (Patient taking differently: Take 17 g by mouth daily as needed for mild constipation. ) 14 each 0  . Menthol-Camphor (ICY HOT ADVANCED RELIEF) 16-11 % CREA Apply  1 application topically 4 (four) times daily as needed (pain).     . rosuvastatin (CRESTOR) 40 MG tablet Take 1/2 to 1 tablet daily or as directed for Cholesterol (Patient not taking: Reported on 04/08/2019) 90 tablet 1  . Sofosbuvir-Velpatasvir (EPCLUSA) 400-100 MG TABS Take 1 tablet by mouth daily. (Patient not taking: Reported on 04/08/2019) 28 tablet 2   No current facility-administered medications on file prior to visit.     Current Problems (verified) Patient Active Problem List   Diagnosis Date Noted  . Back pain 04/04/2019  . Aortic atherosclerosis ([place]) 04/04/2019  . H/O intravenous drug use in remission 02/21/2019  . Low blood pressure 02/20/2019  . Postoperative anemia due to acute blood loss 02/12/2019  . Vertebral osteomyelitis ([place]) 10/25/2018  . Anxiety 10/25/2018  . History of cocaine use 10/24/2018  . Marijuana smoker 10/24/2018  . Diskitis 10/08/2018  . History of prediabetes 06/26/2018  . Hepatitis C, chronic ([place]) 04/01/2015   . Tobacco use disorder 04/01/2015  . Other abnormal glucose 12/10/2014  . Obesity (BMI 30.0-34.9) 12/10/2014  . Medication management 12/03/2013  . Essential hypertension 04/25/2013  . Hyperlipidemia, mixed 04/25/2013  . Vitamin D deficiency 04/25/2013  . Bipolar disorder ([place]) 02/19/2007    Screening Tests Immunization History  Administered Date(s) Administered  . DT 04/01/2015  . Influenza,inj,Quad PF,6+ Mos 02/20/2019  . Influenza-Unspecified 02/20/2019  . Td 05/23/2004    Preventative care: Last colonoscopy: never Cologard: 10/2016 due 10/2019 Stress test 2006 neg CXR 11/2018 [place] AB 2010 Ct abd/pel: 10/14/2018   Prior vaccinations: TD or Tdap: 2016  Influenza: 01/2019  Pneumococcal: N/A Prevnar13: N/A Shingles/Zostavax: N/A  Names of Other Physician/Practitioners you currently use: 1. [facility] here for primary care 2. [facility], Dr. [facility] in [place] eye doctor, last visit 06/2017 3. Dentures,  Patient Care Team: Unk Pinto, MD as PCP - General (Internal Medicine) Nickie Retort, MD as Consulting Physician (Urology)  Allergies Allergies  Allergen Reactions  . Codeine Itching  . Dilaudid [Hydromorphone Hcl] Itching  . Shellfish Allergy Diarrhea and Nausea And Vomiting    SURGICAL HISTORY He  has a past surgical history that includes Cholecystectomy; Lithotripsy; laser surgery for kidney stones (Left); and Laminectomy with posterior lateral arthrodesis level 4 (N/A, 02/01/2019). FAMILY HISTORY His family history includes Arthritis in his father; Mental illness in his mother; Stroke in his father. SOCIAL HISTORY He  reports that he has been smoking cigarettes. He started smoking about 42 years ago. He has a 21.00 pack-year smoking history. He has never used smokeless tobacco. He reports current alcohol use. He reports current drug use. Frequency: 1.00 time per week. Drug: Marijuana.  MEDICARE WELLNESS  OBJECTIVES: Physical activity: Current Exercise Habits: Home exercise routine, Type of exercise: strength training/weights;walking, Time (Minutes): 30, Frequency (Times/Week): 7, Weekly Exercise (Minutes/Week): 210, Intensity: Mild, Exercise limited by: orthopedic condition(s);neurologic condition(s) Cardiac risk factors: Cardiac Risk Factors include: advanced age (>30men, >31 women);male gender;hypertension;dyslipidemia;obesity (BMI >30kg/m2);smoking/ tobacco exposure Depression/mood screen:   Depression screen [facility] 2/9 07/01/2018  Decreased Interest 0  Down, Depressed, Hopeless 0  PHQ - 2 Score 0    ADLs:  In your present state of health, do you have any difficulty performing the following activities: 04/08/2019 02/05/2019  Hearing? N N  Vision? N N  Difficulty concentrating or making decisions? N N  Walking or climbing stairs? N Y  Dressing or bathing? N Y  Doing errands, shopping? N Y  Some recent data might be  hidden     Cognitive Testing  Alert? Yes  Normal Appearance?Yes  Oriented to person? Yes  Place? Yes   Time? Yes  Recall of three objects?  Yes  Can perform simple calculations? Yes  Displays appropriate judgment?Yes  Can read the correct time from a watch face?Yes  EOL planning: Does Patient Have a Medical Advance Directive?: Yes Type of Advance Directive: Healthcare Power of Attorney, Living will Does patient want to make changes to medical advance directive?: No - Patient declined Copy of [place] in Chart?: No - copy requested   Objective:   Today's Vitals   04/08/19 1124  BP: 132/84  Pulse: 90  Temp: (!) 97 F (36.1 C)  SpO2: 97%  Weight: 235 lb 9.6 oz (106.9 kg)  Height: 6' (1.829 m)   Body mass index is 31.95 kg/m.  General appearance: alert, no distress, WD/WN, male HEENT: normocephalic, sclerae anicteric, TMs pearly, nares patent, no discharge or erythema, pharynx normal Oral cavity: MMM, no lesions Neck: supple, no  lymphadenopathy, no thyromegaly, no masses Heart: RRR, normal S1, S2, no murmurs Lungs: Lung sounds present throughout bilaterally, with rhonchi, scattered rales, scattered faint wheezing. No stridor Abdomen: well healing vertical scar, left sided ventral hernia, obese, +bs, soft, non tender, non distended, no masses, no hepatomegaly, no splenomegaly Musculoskeletal: nontender, no swelling, no obvious deformity Extremities: no edema, no cyanosis, no clubbing,  Skin: warm/dry; well healing surgical scar midline back Pulses: 2+ symmetric, upper and lower extremities, normal cap refill Neurological: alert, oriented x 3, CN2-12 intact, strength normal upper extremities and lower extremities, sensation normal throughout, DTRs 2+ throughout, no cerebellar signs, gait antaglic Psychiatric: normal affect, behavior normal, pleasant   Medicare Attestation I have personally reviewed: The patient's medical and social history Their use of alcohol, tobacco or illicit drugs Their current medications and supplements The patient's functional ability including ADLs,fall risks, home safety risks, cognitive, and hearing and visual impairment Diet and physical activities Evidence for depression or mood disorders  The patient's weight, height, BMI, and visual acuity have been recorded in the chart.  I have made referrals, counseling, and provided education to the patient based on review of the above and I have provided the patient with a written personalized care plan for preventive services.     Izora Ribas, NP   04/08/2019

## 2019-04-08 ENCOUNTER — Encounter: Payer: Self-pay | Admitting: Adult Health

## 2019-04-08 ENCOUNTER — Ambulatory Visit (INDEPENDENT_AMBULATORY_CARE_PROVIDER_SITE_OTHER): Payer: Medicare Other | Admitting: Adult Health

## 2019-04-08 ENCOUNTER — Other Ambulatory Visit: Payer: Self-pay

## 2019-04-08 VITALS — BP 132/84 | HR 90 | Temp 97.0°F | Ht 72.0 in | Wt 235.6 lb

## 2019-04-08 DIAGNOSIS — M4620 Osteomyelitis of vertebra, site unspecified: Secondary | ICD-10-CM

## 2019-04-08 DIAGNOSIS — Z79899 Other long term (current) drug therapy: Secondary | ICD-10-CM

## 2019-04-08 DIAGNOSIS — E782 Mixed hyperlipidemia: Secondary | ICD-10-CM | POA: Diagnosis not present

## 2019-04-08 DIAGNOSIS — I700 Atherosclerosis of aorta: Secondary | ICD-10-CM | POA: Diagnosis not present

## 2019-04-08 DIAGNOSIS — G8929 Other chronic pain: Secondary | ICD-10-CM

## 2019-04-08 DIAGNOSIS — Z0000 Encounter for general adult medical examination without abnormal findings: Secondary | ICD-10-CM

## 2019-04-08 DIAGNOSIS — F419 Anxiety disorder, unspecified: Secondary | ICD-10-CM

## 2019-04-08 DIAGNOSIS — E669 Obesity, unspecified: Secondary | ICD-10-CM

## 2019-04-08 DIAGNOSIS — R7309 Other abnormal glucose: Secondary | ICD-10-CM

## 2019-04-08 DIAGNOSIS — Z87898 Personal history of other specified conditions: Secondary | ICD-10-CM

## 2019-04-08 DIAGNOSIS — F3112 Bipolar disorder, current episode manic without psychotic features, moderate: Secondary | ICD-10-CM

## 2019-04-08 DIAGNOSIS — M4644 Discitis, unspecified, thoracic region: Secondary | ICD-10-CM

## 2019-04-08 DIAGNOSIS — R6889 Other general symptoms and signs: Secondary | ICD-10-CM | POA: Diagnosis not present

## 2019-04-08 DIAGNOSIS — E559 Vitamin D deficiency, unspecified: Secondary | ICD-10-CM

## 2019-04-08 DIAGNOSIS — Z0001 Encounter for general adult medical examination with abnormal findings: Secondary | ICD-10-CM | POA: Diagnosis not present

## 2019-04-08 DIAGNOSIS — M549 Dorsalgia, unspecified: Secondary | ICD-10-CM

## 2019-04-08 DIAGNOSIS — F1491 Cocaine use, unspecified, in remission: Secondary | ICD-10-CM

## 2019-04-08 DIAGNOSIS — I10 Essential (primary) hypertension: Secondary | ICD-10-CM | POA: Diagnosis not present

## 2019-04-08 DIAGNOSIS — Z23 Encounter for immunization: Secondary | ICD-10-CM

## 2019-04-08 DIAGNOSIS — B182 Chronic viral hepatitis C: Secondary | ICD-10-CM | POA: Diagnosis not present

## 2019-04-08 DIAGNOSIS — F1290 Cannabis use, unspecified, uncomplicated: Secondary | ICD-10-CM

## 2019-04-08 DIAGNOSIS — D62 Acute posthemorrhagic anemia: Secondary | ICD-10-CM

## 2019-04-08 DIAGNOSIS — F1991 Other psychoactive substance use, unspecified, in remission: Secondary | ICD-10-CM

## 2019-04-08 DIAGNOSIS — F17200 Nicotine dependence, unspecified, uncomplicated: Secondary | ICD-10-CM

## 2019-04-08 MED ORDER — CITALOPRAM HYDROBROMIDE 40 MG PO TABS: 40.0000 mg | ORAL_TABLET | Freq: Every day | ORAL | 1 refills | Status: AC

## 2019-04-08 NOTE — Patient Instructions (Addendum)
  Mr. Kessner , Thank you for taking time to come for your Medicare Wellness Visit. I appreciate your ongoing commitment to your health goals. Please review the following plan we discussed and let me know if I can assist you in the future.   These are the goals we discussed: Goals    . Blood Pressure < 130/80    . Exercise 150 min/wk Moderate Activity    . LDL CALC < 100    . Weight (lb) < 215 lb (97.5 kg)       This is a list of the screening recommended for you and due dates:  Health Maintenance  Topic Date Due  . Cologuard (Stool DNA test)  10/27/2019  . Tetanus Vaccine  03/31/2025  . Flu Shot  Completed  .  Hepatitis C: One time screening is recommended by Center for Disease Control  (CDC) for  adults born from [birth year] through 1964.   Completed  . HIV Screening  Completed     Know what a healthy weight is for you (roughly BMI <25) and aim to maintain this  Aim for 7+ servings of fruits and vegetables daily  65-80+ fluid ounces of water or unsweet tea for healthy kidneys  Limit to max 1 drink of alcohol per day; avoid smoking/tobacco  Limit animal fats in diet for cholesterol and heart health - choose grass fed whenever available  Avoid highly processed foods, and foods high in saturated/trans fats  Aim for low stress - take time to unwind and care for your mental health  Aim for 150 min of moderate intensity exercise weekly for heart health, and weights twice weekly for bone health  Aim for 7-9 hours of sleep daily

## 2019-04-09 LAB — CBC WITH DIFFERENTIAL/PLATELET
Absolute Monocytes: 581 cells/uL (ref 200–950)
Basophils Absolute: 77 cells/uL (ref 0–200)
Basophils Relative: 1.1 %
Eosinophils Absolute: 91 cells/uL (ref 15–500)
Eosinophils Relative: 1.3 %
HCT: 42.9 % (ref 38.5–50.0)
Hemoglobin: 14.2 g/dL (ref 13.2–17.1)
Lymphs Abs: 1638 cells/uL (ref 850–3900)
MCH: 29.4 pg (ref 27.0–33.0)
MCHC: 33.1 g/dL (ref 32.0–36.0)
MCV: 88.8 fL (ref 80.0–100.0)
MPV: 10.1 fL (ref 7.5–12.5)
Monocytes Relative: 8.3 %
Neutro Abs: 4613 cells/uL (ref 1500–7800)
Neutrophils Relative %: 65.9 %
Platelets: 307 10*3/uL (ref 140–400)
RBC: 4.83 10*6/uL (ref 4.20–5.80)
RDW: 14.1 % (ref 11.0–15.0)
Total Lymphocyte: 23.4 %
WBC: 7 10*3/uL (ref 3.8–10.8)

## 2019-04-09 LAB — COMPLETE METABOLIC PANEL WITH GFR
AG Ratio: 1.2 (calc) (ref 1.0–2.5)
ALT: 12 U/L (ref 9–46)
AST: 19 U/L (ref 10–35)
Albumin: 4.4 g/dL (ref 3.6–5.1)
Alkaline phosphatase (APISO): 119 U/L (ref 35–144)
BUN: 12 mg/dL (ref 7–25)
CO2: 26 mmol/L (ref 20–32)
Calcium: 10.3 mg/dL (ref 8.6–10.3)
Chloride: 103 mmol/L (ref 98–110)
Creat: 0.77 mg/dL (ref 0.70–1.33)
GFR, Est African American: 117 mL/min/{1.73_m2} (ref >=60)
GFR, Est Non African American: 101 mL/min/{1.73_m2} (ref >=60)
Globulin: 3.6 g/dL (calc) (ref 1.9–3.7)
Glucose, Bld: 102 mg/dL — ABNORMAL HIGH (ref 65–99)
Potassium: 5.1 mmol/L (ref 3.5–5.3)
Sodium: 138 mmol/L (ref 135–146)
Total Bilirubin: 0.3 mg/dL (ref 0.2–1.2)
Total Protein: 8 g/dL (ref 6.1–8.1)

## 2019-04-09 LAB — LIPID PANEL
Cholesterol: 239 mg/dL — ABNORMAL HIGH (ref <200)
HDL: 62 mg/dL (ref >=40)
LDL Cholesterol (Calc): 158 mg/dL (calc) — ABNORMAL HIGH
Non-HDL Cholesterol (Calc): 177 mg/dL (calc) — ABNORMAL HIGH (ref <130)
Total CHOL/HDL Ratio: 3.9 (calc) (ref <5.0)
Triglycerides: 83 mg/dL (ref <150)

## 2019-04-09 LAB — TSH: TSH: 1.25 mIU/L (ref 0.40–4.50)

## 2019-04-09 LAB — MAGNESIUM: Magnesium: 2.2 mg/dL (ref 1.5–2.5)

## 2019-04-25 DIAGNOSIS — M4644 Discitis, unspecified, thoracic region: Secondary | ICD-10-CM | POA: Diagnosis not present

## 2019-04-29 ENCOUNTER — Ambulatory Visit: Payer: Medicare Other | Admitting: Infectious Disease

## 2019-05-05 ENCOUNTER — Other Ambulatory Visit: Payer: Self-pay | Admitting: Adult Health

## 2019-05-05 DIAGNOSIS — R0789 Other chest pain: Secondary | ICD-10-CM

## 2019-05-06 ENCOUNTER — Ambulatory Visit: Payer: Medicare Other | Admitting: Infectious Disease

## 2019-05-15 ENCOUNTER — Ambulatory Visit: Payer: Medicare Other | Admitting: Internal Medicine

## 2019-05-30 ENCOUNTER — Other Ambulatory Visit: Payer: Self-pay | Admitting: Internal Medicine

## 2019-05-30 DIAGNOSIS — E782 Mixed hyperlipidemia: Secondary | ICD-10-CM

## 2019-06-02 ENCOUNTER — Emergency Department (HOSPITAL_COMMUNITY): Payer: Medicare Other

## 2019-06-02 ENCOUNTER — Encounter (HOSPITAL_COMMUNITY): Payer: Self-pay | Admitting: Emergency Medicine

## 2019-06-02 ENCOUNTER — Other Ambulatory Visit: Payer: Self-pay

## 2019-06-02 ENCOUNTER — Inpatient Hospital Stay (HOSPITAL_COMMUNITY)
Admission: EM | Admit: 2019-06-02 | Discharge: 2019-06-12 | DRG: 177 | Disposition: A | Discharge status: Home Health | Payer: Medicare Other | Attending: Internal Medicine | Admitting: Internal Medicine

## 2019-06-02 DIAGNOSIS — J440 Chronic obstructive pulmonary disease with acute lower respiratory infection: Secondary | ICD-10-CM | POA: Diagnosis present

## 2019-06-02 DIAGNOSIS — R509 Fever, unspecified: Secondary | ICD-10-CM | POA: Diagnosis present

## 2019-06-02 DIAGNOSIS — Z823 Family history of stroke: Secondary | ICD-10-CM

## 2019-06-02 DIAGNOSIS — J189 Pneumonia, unspecified organism: Secondary | ICD-10-CM | POA: Diagnosis present

## 2019-06-02 DIAGNOSIS — Z8261 Family history of arthritis: Secondary | ICD-10-CM

## 2019-06-02 DIAGNOSIS — J15212 Pneumonia due to Methicillin resistant Staphylococcus aureus: Secondary | ICD-10-CM | POA: Diagnosis not present

## 2019-06-02 DIAGNOSIS — B952 Enterococcus as the cause of diseases classified elsewhere: Secondary | ICD-10-CM | POA: Diagnosis present

## 2019-06-02 DIAGNOSIS — B9562 Methicillin resistant Staphylococcus aureus infection as the cause of diseases classified elsewhere: Secondary | ICD-10-CM

## 2019-06-02 DIAGNOSIS — R000 Tachycardia, unspecified: Secondary | ICD-10-CM | POA: Diagnosis not present

## 2019-06-02 DIAGNOSIS — Z8701 Personal history of pneumonia (recurrent): Secondary | ICD-10-CM

## 2019-06-02 DIAGNOSIS — R05 Cough: Secondary | ICD-10-CM | POA: Diagnosis not present

## 2019-06-02 DIAGNOSIS — F17210 Nicotine dependence, cigarettes, uncomplicated: Secondary | ICD-10-CM | POA: Diagnosis present

## 2019-06-02 DIAGNOSIS — Z8614 Personal history of Methicillin resistant Staphylococcus aureus infection: Secondary | ICD-10-CM

## 2019-06-02 DIAGNOSIS — F319 Bipolar disorder, unspecified: Secondary | ICD-10-CM | POA: Diagnosis present

## 2019-06-02 DIAGNOSIS — Z91013 Allergy to seafood: Secondary | ICD-10-CM

## 2019-06-02 DIAGNOSIS — Z885 Allergy status to narcotic agent status: Secondary | ICD-10-CM

## 2019-06-02 DIAGNOSIS — E119 Type 2 diabetes mellitus without complications: Secondary | ICD-10-CM | POA: Diagnosis present

## 2019-06-02 DIAGNOSIS — Z79899 Other long term (current) drug therapy: Secondary | ICD-10-CM

## 2019-06-02 DIAGNOSIS — I10 Essential (primary) hypertension: Secondary | ICD-10-CM | POA: Diagnosis present

## 2019-06-02 DIAGNOSIS — Z6832 Body mass index (BMI) 32.0-32.9, adult: Secondary | ICD-10-CM

## 2019-06-02 DIAGNOSIS — M4620 Osteomyelitis of vertebra, site unspecified: Secondary | ICD-10-CM | POA: Diagnosis present

## 2019-06-02 DIAGNOSIS — Z9049 Acquired absence of other specified parts of digestive tract: Secondary | ICD-10-CM

## 2019-06-02 DIAGNOSIS — J9601 Acute respiratory failure with hypoxia: Secondary | ICD-10-CM | POA: Diagnosis not present

## 2019-06-02 DIAGNOSIS — Z87442 Personal history of urinary calculi: Secondary | ICD-10-CM

## 2019-06-02 DIAGNOSIS — Z902 Acquired absence of lung [part of]: Secondary | ICD-10-CM

## 2019-06-02 DIAGNOSIS — F419 Anxiety disorder, unspecified: Secondary | ICD-10-CM | POA: Diagnosis present

## 2019-06-02 DIAGNOSIS — R0600 Dyspnea, unspecified: Secondary | ICD-10-CM | POA: Diagnosis present

## 2019-06-02 DIAGNOSIS — R0902 Hypoxemia: Secondary | ICD-10-CM

## 2019-06-02 DIAGNOSIS — J441 Chronic obstructive pulmonary disease with (acute) exacerbation: Secondary | ICD-10-CM | POA: Diagnosis not present

## 2019-06-02 DIAGNOSIS — E782 Mixed hyperlipidemia: Secondary | ICD-10-CM | POA: Diagnosis present

## 2019-06-02 DIAGNOSIS — R7881 Bacteremia: Secondary | ICD-10-CM | POA: Diagnosis present

## 2019-06-02 DIAGNOSIS — E559 Vitamin D deficiency, unspecified: Secondary | ICD-10-CM | POA: Diagnosis present

## 2019-06-02 DIAGNOSIS — Z818 Family history of other mental and behavioral disorders: Secondary | ICD-10-CM

## 2019-06-02 DIAGNOSIS — J811 Chronic pulmonary edema: Secondary | ICD-10-CM | POA: Diagnosis not present

## 2019-06-02 DIAGNOSIS — I700 Atherosclerosis of aorta: Secondary | ICD-10-CM | POA: Diagnosis present

## 2019-06-02 DIAGNOSIS — E669 Obesity, unspecified: Secondary | ICD-10-CM | POA: Diagnosis present

## 2019-06-02 DIAGNOSIS — Z20822 Contact with and (suspected) exposure to covid-19: Secondary | ICD-10-CM | POA: Diagnosis present

## 2019-06-02 DIAGNOSIS — Z8619 Personal history of other infectious and parasitic diseases: Secondary | ICD-10-CM

## 2019-06-02 DIAGNOSIS — R059 Cough, unspecified: Secondary | ICD-10-CM

## 2019-06-02 DIAGNOSIS — Z791 Long term (current) use of non-steroidal anti-inflammatories (NSAID): Secondary | ICD-10-CM

## 2019-06-02 DIAGNOSIS — G894 Chronic pain syndrome: Secondary | ICD-10-CM | POA: Diagnosis present

## 2019-06-02 DIAGNOSIS — Z79891 Long term (current) use of opiate analgesic: Secondary | ICD-10-CM

## 2019-06-02 DIAGNOSIS — Z981 Arthrodesis status: Secondary | ICD-10-CM

## 2019-06-02 DIAGNOSIS — R0602 Shortness of breath: Secondary | ICD-10-CM | POA: Diagnosis not present

## 2019-06-02 DIAGNOSIS — Z7982 Long term (current) use of aspirin: Secondary | ICD-10-CM

## 2019-06-02 LAB — CBC WITH DIFFERENTIAL/PLATELET
Abs Immature Granulocytes: 0.04 10*3/uL (ref 0.00–0.07)
Basophils Absolute: 0.1 10*3/uL (ref 0.0–0.1)
Basophils Relative: 1 %
Eosinophils Absolute: 0.1 10*3/uL (ref 0.0–0.5)
Eosinophils Relative: 1 %
HCT: 39.4 % (ref 39.0–52.0)
Hemoglobin: 12.8 g/dL — ABNORMAL LOW (ref 13.0–17.0)
Immature Granulocytes: 0 %
Lymphocytes Relative: 6 %
Lymphs Abs: 0.6 10*3/uL — ABNORMAL LOW (ref 0.7–4.0)
MCH: 28.9 pg (ref 26.0–34.0)
MCHC: 32.5 g/dL (ref 30.0–36.0)
MCV: 88.9 fL (ref 80.0–100.0)
Monocytes Absolute: 0.6 10*3/uL (ref 0.1–1.0)
Monocytes Relative: 7 %
Neutro Abs: 8.5 10*3/uL — ABNORMAL HIGH (ref 1.7–7.7)
Neutrophils Relative %: 85 %
Platelets: 348 10*3/uL (ref 150–400)
RBC: 4.43 MIL/uL (ref 4.22–5.81)
RDW: 14 % (ref 11.5–15.5)
WBC: 9.9 10*3/uL (ref 4.0–10.5)
nRBC: 0 % (ref 0.0–0.2)

## 2019-06-02 LAB — COMPREHENSIVE METABOLIC PANEL
ALT: 21 U/L (ref 0–44)
AST: 28 U/L (ref 15–41)
Albumin: 3.1 g/dL — ABNORMAL LOW (ref 3.5–5.0)
Alkaline Phosphatase: 99 U/L (ref 38–126)
Anion gap: 11 (ref 5–15)
BUN: 9 mg/dL (ref 6–20)
CO2: 24 mmol/L (ref 22–32)
Calcium: 9.1 mg/dL (ref 8.9–10.3)
Chloride: 99 mmol/L (ref 98–111)
Creatinine, Ser: 0.82 mg/dL (ref 0.61–1.24)
GFR calc Af Amer: >60 mL/min (ref >60)
GFR calc non Af Amer: >60 mL/min (ref >60)
Glucose, Bld: 131 mg/dL — ABNORMAL HIGH (ref 70–99)
Potassium: 3.7 mmol/L (ref 3.5–5.1)
Sodium: 134 mmol/L — ABNORMAL LOW (ref 135–145)
Total Bilirubin: 0.3 mg/dL (ref 0.3–1.2)
Total Protein: 8 g/dL (ref 6.5–8.1)

## 2019-06-02 LAB — POC SARS CORONAVIRUS 2 AG -  ED: SARS Coronavirus 2 Ag: NEGATIVE

## 2019-06-02 LAB — TROPONIN I (HIGH SENSITIVITY): Troponin I (High Sensitivity): 4 ng/L (ref <18)

## 2019-06-02 LAB — LACTIC ACID, PLASMA: Lactic Acid, Venous: 1.2 mmol/L (ref 0.5–1.9)

## 2019-06-02 LAB — BRAIN NATRIURETIC PEPTIDE: B Natriuretic Peptide: 31 pg/mL (ref 0.0–100.0)

## 2019-06-02 MED ORDER — SODIUM CHLORIDE 0.9 % IV SOLN
500.0000 mg | Freq: Once | INTRAVENOUS | Status: AC
  Administered 2019-06-03: 500 mg via INTRAVENOUS
  Filled 2019-06-02: qty 500

## 2019-06-02 MED ORDER — SODIUM CHLORIDE 0.9% FLUSH
3.0000 mL | Freq: Once | INTRAVENOUS | Status: AC
  Administered 2019-06-02: 23:00:00 3 mL via INTRAVENOUS

## 2019-06-02 MED ORDER — ACETAMINOPHEN 500 MG PO TABS
1000.0000 mg | ORAL_TABLET | Freq: Once | ORAL | Status: AC
  Administered 2019-06-02: 23:00:00 1000 mg via ORAL
  Filled 2019-06-02: qty 2

## 2019-06-02 MED ORDER — ALBUTEROL SULFATE HFA 108 (90 BASE) MCG/ACT IN AERS
4.0000 | INHALATION_SPRAY | Freq: Once | RESPIRATORY_TRACT | Status: AC
  Administered 2019-06-03: 4 via RESPIRATORY_TRACT
  Filled 2019-06-02: qty 6.7

## 2019-06-02 MED ORDER — SODIUM CHLORIDE 0.9 % IV SOLN
2.0000 g | Freq: Once | INTRAVENOUS | Status: AC
  Administered 2019-06-03: 01:00:00 2 g via INTRAVENOUS
  Filled 2019-06-02: qty 20

## 2019-06-02 NOTE — ED Notes (Signed)
Pt is aware of the need for a urine sample. Urinal provided.

## 2019-06-02 NOTE — ED Triage Notes (Signed)
Pt reports having fever and shortness of breath for the last few days. Pt reports a temp of 103.3 and taken Motrin appx 4 hr ago.

## 2019-06-02 NOTE — ED Provider Notes (Signed)
[facility] DEPT Provider Note   CSN: [id number] Arrival date & time: 06/02/19  2219     History Chief Complaint  Patient presents with  . Fever  . Shortness of Breath    Howard Fox is a 59 y.o. male.  HPI     Patient presents with dyspnea and fever. Patient has a history of multiple medical issues, most notably had pneumonia earlier in the year.  (He also states that he has a family history of walking pneumonia.)  Over the past 2 days he has had fever, increased work of breathing, cough.  No pain, no weakness in any extremity, no nausea, vomiting, diarrhea, abdominal pain. He did take ibuprofen today, and oxycodone which is prescribed for recent back surgery.  He notes no new weakness in any of extremities, as above, and indeed is improved functionality since the surgery. He has been tested for Covid previously, though not in at least the past few weeks. He is a smoker, having used cigarettes since he was 78.  Past Medical History:  Diagnosis Date  . Anxiety 10/25/2018  . Arthritis   . Bipolar 1 disorder ([place])   . Depression   . Fracture, ribs 1993   ruptured stomach, left lung resulted from industrial fall  . Heart murmur    as a child  . Hepatitis C   . History of kidney stones   . Hypertension   . Kidney stones   . Low blood pressure 02/20/2019  . MRSA bacteremia 10/09/2018  . S/P lumbar fusion 02/01/2019  . Vertebral osteomyelitis ([place]) 10/25/2018    Patient Active Problem List   Diagnosis Date Noted  . Back pain 04/04/2019  . Aortic atherosclerosis ([place]) 04/04/2019  . H/O intravenous drug use in remission 02/21/2019  . Low blood pressure 02/20/2019  . Postoperative anemia due to acute blood loss 02/12/2019  . Vertebral osteomyelitis ([place]) 10/25/2018  . Anxiety 10/25/2018  . History of cocaine use 10/24/2018  . Marijuana smoker 10/24/2018  . Diskitis 10/08/2018  . History of prediabetes 06/26/2018  . Hepatitis C, chronic  ([place]) 04/01/2015  . Tobacco use disorder 04/01/2015  . Other abnormal glucose 12/10/2014  . Obesity (BMI 30.0-34.9) 12/10/2014  . Medication management 12/03/2013  . Essential hypertension 04/25/2013  . Hyperlipidemia, mixed 04/25/2013  . Vitamin D deficiency 04/25/2013  . Bipolar disorder ([place]) 02/19/2007    Past Surgical History:  Procedure Laterality Date  . CHOLECYSTECTOMY    . LAMINECTOMY WITH POSTERIOR LATERAL ARTHRODESIS LEVEL 4 N/A 02/01/2019   Procedure: Laminectomy and Foraminotomy - Thoracic ten-Thoracic eleven, Instrumented fusion Thoracic eight-Lumbar two;  Surgeon: Eustace Moore, MD;  Location: [facility];  Service: Neurosurgery;  Laterality: N/A;  . laser surgery for kidney stones Left   . LITHOTRIPSY         Family History  Problem Relation Age of Onset  . Mental illness Mother        Bipolar  . Stroke Father   . Arthritis Father     Social History   Tobacco Use  . Smoking status: Current Every Day Smoker    Packs/day: 0.50    Years: 42.00    Pack years: 21.00    Types: Cigarettes    Start date: 01/02/1977  . Smokeless tobacco: Never Used  Substance Use Topics  . Alcohol use: Yes    Comment: 2 beers a day  . Drug use: Yes    Frequency: 1.0 times per week    Types: Marijuana  Comment: last time 2 weeks ago    Home Medications Prior to Admission medications   Medication Sig Start Date End Date Taking? Authorizing Provider  albuterol (VENTOLIN HFA) 108 (90 Base) MCG/ACT inhaler Use 2 Inhalations 15 minutes apart every 4 hours to Rescue Asthma 02/16/19   Unk Pinto, MD  amitriptyline (ELAVIL) 150 MG tablet TAKE 1 TABLET BY MOUTH AT BEDTIME FOR SLEEP AND MOOD 03/15/19   Liane Comber, NP  aspirin EC 325 MG tablet Take 325 mg by mouth daily.     [provider]  carisoprodol (SOMA) 350 MG tablet Take 350 mg by mouth every 6 (six) hours as needed for muscle spasms.     [provider]  cholecalciferol (VITAMIN D3) 25 MCG (1000 UT)  tablet Take 5,000 Units by mouth daily.    [provider]  citalopram (CELEXA) 40 MG tablet Take 1 tablet (40 mg total) by mouth daily with lunch. 04/08/19   Liane Comber, NP  doxycycline (VIBRA-TABS) 100 MG tablet Take 1 tablet (100 mg total) by mouth every 12 (twelve) hours. 02/20/19   Truman Hayward, MD  gabapentin (NEURONTIN) 300 MG capsule Take 1 capsule 3 x /day if needed for Pain 05/05/19   Unk Pinto, MD  ibuprofen (ADVIL,MOTRIN) 200 MG tablet Take 800-1,200 mg by mouth daily after lunch.     [provider]  Menthol-Camphor (ICY HOT ADVANCED RELIEF) 16-11 % CREA Apply 1 application topically 4 (four) times daily as needed (pain).     [provider]  oxyCODONE-acetaminophen (PERCOCET) 10-325 MG tablet Take 1 tablet by mouth every 6 (six) hours as needed for pain. Patient taking differently: Take 1 tablet by mouth every 6 (six) hours as needed for pain. 7.5-325 every 6 hours prn 02/05/19   Eustace Moore, MD  polyethylene glycol (MIRALAX / GLYCOLAX) 17 g packet Take 17 g by mouth daily as needed. Patient taking differently: Take 17 g by mouth daily as needed for mild constipation.  10/15/18   Shelly Coss, MD  rosuvastatin (CRESTOR) 40 MG tablet TAKE 1/2 TO 1 (ONE-HALF TO ONE) TABLET ONCE DAILY OR  AS DIRECTED FOR CHOLESTEROL 05/30/19   Unk Pinto, MD  Sofosbuvir-Velpatasvir (EPCLUSA) 400-100 MG TABS Take 1 tablet by mouth daily. Patient not taking: Reported on 04/08/2019 11/06/18   Tommy Medal, Lavell Islam, MD    Allergies    Codeine, Dilaudid [hydromorphone hcl], and Shellfish allergy  Review of Systems   Review of Systems  Constitutional:       Per HPI, otherwise negative  HENT:       Per HPI, otherwise negative  Respiratory:       Per HPI, otherwise negative  Cardiovascular:       Per HPI, otherwise negative  Gastrointestinal: Negative for vomiting.  Endocrine:       Negative aside from HPI  Genitourinary:       Neg aside from HPI    Musculoskeletal:       Per HPI, otherwise negative  Skin: Negative.   Neurological: Negative for syncope.    Physical Exam Updated Vital Signs BP (!) 145/86 (BP Location: Right Arm)   Pulse (!) 119   Temp (!) 102.9 F (39.4 C) (Oral)   Resp (!) 34   Ht 6' (1.829 m)   Wt 109.8 kg   SpO2 91%   BMI 32.82 kg/m   Physical Exam Vitals and nursing note reviewed.  Constitutional:      General: He is not in  acute distress.    Appearance: He is well-developed.  HENT:     Head: Normocephalic and atraumatic.  Eyes:     Conjunctiva/sclera: Conjunctivae normal.  Cardiovascular:     Rate and Rhythm: Regular rhythm. Tachycardia present.  Pulmonary:     Effort: Tachypnea and accessory muscle usage present.     Breath sounds: Decreased breath sounds present.  Abdominal:     General: There is no distension.  Musculoskeletal:     Right lower leg: No edema.     Left lower leg: No edema.  Skin:    General: Skin is warm and dry.  Neurological:     Mental Status: He is alert and oriented to person, place, and time.  Psychiatric:        Mood and Affect: Mood normal.     ED Results / Procedures / Treatments   Labs (all labs ordered are listed, but only abnormal results are displayed) Labs Reviewed  LACTIC ACID, PLASMA  LACTIC ACID, PLASMA  COMPREHENSIVE METABOLIC PANEL  CBC WITH DIFFERENTIAL/PLATELET  URINALYSIS, ROUTINE W REFLEX MICROSCOPIC  BRAIN NATRIURETIC PEPTIDE  POC SARS CORONAVIRUS 2 AG -  ED    EKG EKG Interpretation  Date/Time:  Sunday June 02 2019 22:32:25 EST Ventricular Rate:  117 PR Interval:    QRS Duration: 108 QT Interval:  329 QTC Calculation: 459 R Axis:   104 Text Interpretation: Sinus tachycardia Right axis deviation Borderline repolarization abnormality Baseline wander in lead(s) II III aVF V1 V2 Abnormal ECG Confirmed by Carmin Muskrat [id number]) on 06/02/2019 10:35:49 PM   Radiology No results found.  Procedures Procedures (including  critical care time)  Medications Ordered in ED Medications  sodium chloride flush (NS) 0.9 % injection 3 mL (has no administration in time range)  acetaminophen (TYLENOL) tablet 1,000 mg (has no administration in time range)    ED Course  I have reviewed the triage vital signs and the nursing notes.  Pertinent labs & imaging results that were available during my care of the patient were reviewed by me and considered in my medical decision making (see chart for details).  Initial vitals notable for tachycardia, rate 115, tachypnea, 30s, hypoxia, room air saturation 91% and fever, 103 oral. With consideration of pneumonia versus Covid versus other consideration of respiratory compromise, the patient will have labs, x-ray.  Tylenol provided.  Patient improved with supplemental oxygen.   MDM Rules/Calculators/A&P                     This adult male with a long history of smoking presents with fever, tachypnea, tachycardia, hypoxia.  Concern for Covid verses PNA.  Dr. Dayna Barker will follow the patient's course.    Howard Fox was evaluated in Emergency Department on 06/02/2019 for the symptoms described in the history of present illness. He was evaluated in the context of the global COVID-19 pandemic, which necessitated consideration that the patient might be at risk for infection with the SARS-CoV-2 virus that causes COVID-19. Institutional protocols and algorithms that pertain to the evaluation of patients at risk for COVID-19 are in a state of rapid change based on information released by regulatory bodies including the CDC and federal and state organizations. These policies and algorithms were followed during the patient's care in the ED.   Final Clinical Impression(s) / ED Diagnoses Final diagnoses:  Fever in adult  Hypoxia     Carmin Muskrat, MD 06/02/19 2340

## 2019-06-03 ENCOUNTER — Telehealth: Payer: Self-pay

## 2019-06-03 DIAGNOSIS — R0600 Dyspnea, unspecified: Secondary | ICD-10-CM | POA: Diagnosis present

## 2019-06-03 DIAGNOSIS — R05 Cough: Secondary | ICD-10-CM | POA: Diagnosis not present

## 2019-06-03 DIAGNOSIS — M48061 Spinal stenosis, lumbar region without neurogenic claudication: Secondary | ICD-10-CM | POA: Diagnosis not present

## 2019-06-03 DIAGNOSIS — Z823 Family history of stroke: Secondary | ICD-10-CM | POA: Diagnosis not present

## 2019-06-03 DIAGNOSIS — J811 Chronic pulmonary edema: Secondary | ICD-10-CM | POA: Diagnosis present

## 2019-06-03 DIAGNOSIS — M542 Cervicalgia: Secondary | ICD-10-CM | POA: Diagnosis not present

## 2019-06-03 DIAGNOSIS — Z9049 Acquired absence of other specified parts of digestive tract: Secondary | ICD-10-CM | POA: Diagnosis not present

## 2019-06-03 DIAGNOSIS — Z885 Allergy status to narcotic agent status: Secondary | ICD-10-CM | POA: Diagnosis not present

## 2019-06-03 DIAGNOSIS — F419 Anxiety disorder, unspecified: Secondary | ICD-10-CM | POA: Diagnosis present

## 2019-06-03 DIAGNOSIS — Z20822 Contact with and (suspected) exposure to covid-19: Secondary | ICD-10-CM | POA: Diagnosis present

## 2019-06-03 DIAGNOSIS — R0602 Shortness of breath: Secondary | ICD-10-CM | POA: Diagnosis not present

## 2019-06-03 DIAGNOSIS — J9811 Atelectasis: Secondary | ICD-10-CM | POA: Diagnosis not present

## 2019-06-03 DIAGNOSIS — J15212 Pneumonia due to Methicillin resistant Staphylococcus aureus: Secondary | ICD-10-CM | POA: Diagnosis present

## 2019-06-03 DIAGNOSIS — Z8739 Personal history of other diseases of the musculoskeletal system and connective tissue: Secondary | ICD-10-CM | POA: Diagnosis not present

## 2019-06-03 DIAGNOSIS — Z8619 Personal history of other infectious and parasitic diseases: Secondary | ICD-10-CM | POA: Diagnosis not present

## 2019-06-03 DIAGNOSIS — Z981 Arthrodesis status: Secondary | ICD-10-CM | POA: Diagnosis not present

## 2019-06-03 DIAGNOSIS — M5124 Other intervertebral disc displacement, thoracic region: Secondary | ICD-10-CM | POA: Diagnosis not present

## 2019-06-03 DIAGNOSIS — J189 Pneumonia, unspecified organism: Secondary | ICD-10-CM | POA: Diagnosis not present

## 2019-06-03 DIAGNOSIS — F1911 Other psychoactive substance abuse, in remission: Secondary | ICD-10-CM | POA: Diagnosis not present

## 2019-06-03 DIAGNOSIS — J90 Pleural effusion, not elsewhere classified: Secondary | ICD-10-CM | POA: Diagnosis not present

## 2019-06-03 DIAGNOSIS — G894 Chronic pain syndrome: Secondary | ICD-10-CM | POA: Diagnosis present

## 2019-06-03 DIAGNOSIS — J441 Chronic obstructive pulmonary disease with (acute) exacerbation: Secondary | ICD-10-CM | POA: Diagnosis present

## 2019-06-03 DIAGNOSIS — R918 Other nonspecific abnormal finding of lung field: Secondary | ICD-10-CM | POA: Diagnosis not present

## 2019-06-03 DIAGNOSIS — M546 Pain in thoracic spine: Secondary | ICD-10-CM | POA: Diagnosis not present

## 2019-06-03 DIAGNOSIS — F3112 Bipolar disorder, current episode manic without psychotic features, moderate: Secondary | ICD-10-CM | POA: Diagnosis not present

## 2019-06-03 DIAGNOSIS — B9562 Methicillin resistant Staphylococcus aureus infection as the cause of diseases classified elsewhere: Secondary | ICD-10-CM | POA: Diagnosis not present

## 2019-06-03 DIAGNOSIS — I10 Essential (primary) hypertension: Secondary | ICD-10-CM | POA: Diagnosis not present

## 2019-06-03 DIAGNOSIS — Z87442 Personal history of urinary calculi: Secondary | ICD-10-CM | POA: Diagnosis not present

## 2019-06-03 DIAGNOSIS — K5900 Constipation, unspecified: Secondary | ICD-10-CM | POA: Diagnosis not present

## 2019-06-03 DIAGNOSIS — Z79899 Other long term (current) drug therapy: Secondary | ICD-10-CM | POA: Diagnosis not present

## 2019-06-03 DIAGNOSIS — J440 Chronic obstructive pulmonary disease with acute lower respiratory infection: Secondary | ICD-10-CM | POA: Diagnosis present

## 2019-06-03 DIAGNOSIS — R0682 Tachypnea, not elsewhere classified: Secondary | ICD-10-CM | POA: Insufficient documentation

## 2019-06-03 DIAGNOSIS — R509 Fever, unspecified: Secondary | ICD-10-CM | POA: Diagnosis not present

## 2019-06-03 DIAGNOSIS — Z8261 Family history of arthritis: Secondary | ICD-10-CM | POA: Diagnosis not present

## 2019-06-03 DIAGNOSIS — I700 Atherosclerosis of aorta: Secondary | ICD-10-CM | POA: Diagnosis not present

## 2019-06-03 DIAGNOSIS — Z91013 Allergy to seafood: Secondary | ICD-10-CM | POA: Diagnosis not present

## 2019-06-03 DIAGNOSIS — Z79891 Long term (current) use of opiate analgesic: Secondary | ICD-10-CM | POA: Diagnosis not present

## 2019-06-03 DIAGNOSIS — R7881 Bacteremia: Secondary | ICD-10-CM | POA: Diagnosis present

## 2019-06-03 DIAGNOSIS — Z7982 Long term (current) use of aspirin: Secondary | ICD-10-CM | POA: Diagnosis not present

## 2019-06-03 DIAGNOSIS — M549 Dorsalgia, unspecified: Secondary | ICD-10-CM | POA: Diagnosis not present

## 2019-06-03 DIAGNOSIS — Z818 Family history of other mental and behavioral disorders: Secondary | ICD-10-CM | POA: Diagnosis not present

## 2019-06-03 DIAGNOSIS — F17210 Nicotine dependence, cigarettes, uncomplicated: Secondary | ICD-10-CM | POA: Diagnosis present

## 2019-06-03 DIAGNOSIS — J9601 Acute respiratory failure with hypoxia: Secondary | ICD-10-CM | POA: Diagnosis present

## 2019-06-03 DIAGNOSIS — Z8614 Personal history of Methicillin resistant Staphylococcus aureus infection: Secondary | ICD-10-CM | POA: Diagnosis not present

## 2019-06-03 DIAGNOSIS — F319 Bipolar disorder, unspecified: Secondary | ICD-10-CM | POA: Diagnosis present

## 2019-06-03 DIAGNOSIS — E782 Mixed hyperlipidemia: Secondary | ICD-10-CM | POA: Diagnosis present

## 2019-06-03 DIAGNOSIS — R350 Frequency of micturition: Secondary | ICD-10-CM | POA: Diagnosis not present

## 2019-06-03 LAB — URINALYSIS, ROUTINE W REFLEX MICROSCOPIC
Bilirubin Urine: NEGATIVE
Glucose, UA: NEGATIVE mg/dL
Ketones, ur: NEGATIVE mg/dL
Leukocytes,Ua: NEGATIVE
Nitrite: NEGATIVE
Protein, ur: NEGATIVE mg/dL
Specific Gravity, Urine: 1.012 (ref 1.005–1.030)
pH: 6 (ref 5.0–8.0)

## 2019-06-03 LAB — EXPECTORATED SPUTUM ASSESSMENT W GRAM STAIN, RFLX TO RESP C

## 2019-06-03 LAB — CBC
HCT: 37.6 % — ABNORMAL LOW (ref 39.0–52.0)
Hemoglobin: 11.9 g/dL — ABNORMAL LOW (ref 13.0–17.0)
MCH: 28.5 pg (ref 26.0–34.0)
MCHC: 31.6 g/dL (ref 30.0–36.0)
MCV: 90 fL (ref 80.0–100.0)
Platelets: 345 10*3/uL (ref 150–400)
RBC: 4.18 MIL/uL — ABNORMAL LOW (ref 4.22–5.81)
RDW: 14.3 % (ref 11.5–15.5)
WBC: 9.5 10*3/uL (ref 4.0–10.5)
nRBC: 0 % (ref 0.0–0.2)

## 2019-06-03 LAB — RESPIRATORY PANEL BY PCR

## 2019-06-03 LAB — BLOOD CULTURE ID PANEL (REFLEXED)

## 2019-06-03 LAB — CBC WITH DIFFERENTIAL/PLATELET
Abs Immature Granulocytes: 0.05 10*3/uL (ref 0.00–0.07)
Basophils Absolute: 0 10*3/uL (ref 0.0–0.1)
Basophils Relative: 0 %
Eosinophils Absolute: 0 10*3/uL (ref 0.0–0.5)
Eosinophils Relative: 0 %
HCT: 39.1 % (ref 39.0–52.0)
Hemoglobin: 12.7 g/dL — ABNORMAL LOW (ref 13.0–17.0)
Immature Granulocytes: 1 %
Lymphocytes Relative: 8 %
Lymphs Abs: 0.9 10*3/uL (ref 0.7–4.0)
MCH: 28.9 pg (ref 26.0–34.0)
MCHC: 32.5 g/dL (ref 30.0–36.0)
MCV: 89.1 fL (ref 80.0–100.0)
Monocytes Absolute: 0.8 10*3/uL (ref 0.1–1.0)
Monocytes Relative: 8 %
Neutro Abs: 8.5 10*3/uL — ABNORMAL HIGH (ref 1.7–7.7)
Neutrophils Relative %: 83 %
Platelets: 353 10*3/uL (ref 150–400)
RBC: 4.39 MIL/uL (ref 4.22–5.81)
RDW: 14.3 % (ref 11.5–15.5)
WBC: 10.3 10*3/uL (ref 4.0–10.5)
nRBC: 0 % (ref 0.0–0.2)

## 2019-06-03 LAB — SARS CORONAVIRUS 2 (TAT 6-24 HRS): SARS Coronavirus 2: NEGATIVE

## 2019-06-03 LAB — BASIC METABOLIC PANEL
Anion gap: 14 (ref 5–15)
BUN: 8 mg/dL (ref 6–20)
CO2: 23 mmol/L (ref 22–32)
Calcium: 8.7 mg/dL — ABNORMAL LOW (ref 8.9–10.3)
Chloride: 99 mmol/L (ref 98–111)
Creatinine, Ser: 0.76 mg/dL (ref 0.61–1.24)
GFR calc Af Amer: >60 mL/min (ref >60)
GFR calc non Af Amer: >60 mL/min (ref >60)
Glucose, Bld: 147 mg/dL — ABNORMAL HIGH (ref 70–99)
Potassium: 3.5 mmol/L (ref 3.5–5.1)
Sodium: 136 mmol/L (ref 135–145)

## 2019-06-03 LAB — FERRITIN: Ferritin: 87 ng/mL (ref 24–336)

## 2019-06-03 LAB — LACTATE DEHYDROGENASE: LDH: 233 U/L — ABNORMAL HIGH (ref 98–192)

## 2019-06-03 LAB — TROPONIN I (HIGH SENSITIVITY): Troponin I (High Sensitivity): 7 ng/L (ref <18)

## 2019-06-03 LAB — CREATININE, SERUM
Creatinine, Ser: 0.89 mg/dL (ref 0.61–1.24)
GFR calc Af Amer: >60 mL/min (ref >60)
GFR calc non Af Amer: >60 mL/min (ref >60)

## 2019-06-03 LAB — C-REACTIVE PROTEIN: CRP: 23.6 mg/dL — ABNORMAL HIGH (ref <1.0)

## 2019-06-03 LAB — D-DIMER, QUANTITATIVE: D-Dimer, Quant: 3.75 ug/mL-FEU — ABNORMAL HIGH (ref 0.00–0.50)

## 2019-06-03 LAB — PROCALCITONIN
Procalcitonin: 0.14 ng/mL
Procalcitonin: 0.16 ng/mL

## 2019-06-03 LAB — FIBRINOGEN: Fibrinogen: >800 mg/dL — ABNORMAL HIGH (ref 210–475)

## 2019-06-03 LAB — LACTIC ACID, PLASMA: Lactic Acid, Venous: 1.1 mmol/L (ref 0.5–1.9)

## 2019-06-03 LAB — STREP PNEUMONIAE URINARY ANTIGEN: Strep Pneumo Urinary Antigen: NEGATIVE

## 2019-06-03 MED ORDER — VANCOMYCIN HCL 2000 MG/400ML IV SOLN
2000.0000 mg | Freq: Once | INTRAVENOUS | Status: AC
  Administered 2019-06-04: 2000 mg via INTRAVENOUS
  Filled 2019-06-03: qty 400

## 2019-06-03 MED ORDER — VITAMIN D 25 MCG (1000 UNIT) PO TABS
5000.0000 [IU] | ORAL_TABLET | Freq: Every day | ORAL | Status: DC
  Administered 2019-06-03 – 2019-06-11 (×9): 5000 [IU] via ORAL
  Filled 2019-06-03 (×10): qty 5

## 2019-06-03 MED ORDER — ASPIRIN EC 325 MG PO TBEC
325.0000 mg | DELAYED_RELEASE_TABLET | Freq: Every day | ORAL | Status: DC
  Administered 2019-06-03 – 2019-06-12 (×10): 325 mg via ORAL
  Filled 2019-06-03 (×10): qty 1

## 2019-06-03 MED ORDER — ENOXAPARIN SODIUM 40 MG/0.4ML ~~LOC~~ SOLN
40.0000 mg | SUBCUTANEOUS | Status: DC
  Administered 2019-06-03 – 2019-06-06 (×4): 40 mg via SUBCUTANEOUS
  Filled 2019-06-03 (×4): qty 0.4

## 2019-06-03 MED ORDER — IPRATROPIUM-ALBUTEROL 20-100 MCG/ACT IN AERS
1.0000 | INHALATION_SPRAY | Freq: Four times a day (QID) | RESPIRATORY_TRACT | Status: DC
  Administered 2019-06-03 – 2019-06-05 (×8): 1 via RESPIRATORY_TRACT
  Filled 2019-06-03: qty 4

## 2019-06-03 MED ORDER — GABAPENTIN 300 MG PO CAPS
300.0000 mg | ORAL_CAPSULE | Freq: Three times a day (TID) | ORAL | Status: DC
  Administered 2019-06-03 – 2019-06-12 (×30): 300 mg via ORAL
  Filled 2019-06-03 (×30): qty 1

## 2019-06-03 MED ORDER — SODIUM CHLORIDE 0.9 % IV SOLN
500.0000 mg | INTRAVENOUS | Status: DC
  Administered 2019-06-03: 22:00:00 500 mg via INTRAVENOUS
  Filled 2019-06-03 (×3): qty 500

## 2019-06-03 MED ORDER — OXYCODONE HCL 5 MG PO TABS
5.0000 mg | ORAL_TABLET | Freq: Four times a day (QID) | ORAL | Status: DC | PRN
  Administered 2019-06-03 – 2019-06-12 (×29): 10 mg via ORAL
  Filled 2019-06-03 (×31): qty 2
  Filled 2019-06-03: qty 1

## 2019-06-03 MED ORDER — DEXAMETHASONE SODIUM PHOSPHATE 10 MG/ML IJ SOLN
6.0000 mg | Freq: Once | INTRAMUSCULAR | Status: AC
  Administered 2019-06-03: 10:00:00 6 mg via INTRAVENOUS
  Filled 2019-06-03: qty 1

## 2019-06-03 MED ORDER — SODIUM CHLORIDE 0.9 % IV SOLN
2.0000 g | INTRAVENOUS | Status: DC
  Administered 2019-06-03: 21:00:00 2 g via INTRAVENOUS
  Filled 2019-06-03: qty 20
  Filled 2019-06-03: qty 2

## 2019-06-03 MED ORDER — CITALOPRAM HYDROBROMIDE 20 MG PO TABS
20.0000 mg | ORAL_TABLET | Freq: Every day | ORAL | Status: DC
  Administered 2019-06-03 – 2019-06-12 (×10): 20 mg via ORAL
  Filled 2019-06-03 (×8): qty 1
  Filled 2019-06-03: qty 2
  Filled 2019-06-03 (×2): qty 1

## 2019-06-03 MED ORDER — AMITRIPTYLINE HCL 25 MG PO TABS
150.0000 mg | ORAL_TABLET | Freq: Every day | ORAL | Status: DC
  Administered 2019-06-03 – 2019-06-11 (×10): 150 mg via ORAL
  Filled 2019-06-03 (×10): qty 6

## 2019-06-03 MED ORDER — ALBUTEROL SULFATE (2.5 MG/3ML) 0.083% IN NEBU: 2.5000 mg | INHALATION_SOLUTION | RESPIRATORY_TRACT | Status: DC | PRN

## 2019-06-03 MED ORDER — IBUPROFEN 800 MG PO TABS
800.0000 mg | ORAL_TABLET | Freq: Every day | ORAL | Status: DC
  Administered 2019-06-03 – 2019-06-06 (×4): 800 mg via ORAL
  Filled 2019-06-03 (×4): qty 1

## 2019-06-03 MED ORDER — ROSUVASTATIN CALCIUM 20 MG PO TABS
40.0000 mg | ORAL_TABLET | Freq: Every day | ORAL | Status: DC
  Administered 2019-06-03 – 2019-06-12 (×10): 40 mg via ORAL
  Filled 2019-06-03: qty 2
  Filled 2019-06-03: qty 4
  Filled 2019-06-03 (×3): qty 2
  Filled 2019-06-03: qty 4
  Filled 2019-06-03 (×2): qty 2
  Filled 2019-06-03 (×2): qty 4
  Filled 2019-06-03: qty 2
  Filled 2019-06-03 (×2): qty 4
  Filled 2019-06-03: qty 2
  Filled 2019-06-03: qty 1
  Filled 2019-06-03: qty 2
  Filled 2019-06-03: qty 4
  Filled 2019-06-03: qty 2
  Filled 2019-06-03: qty 4

## 2019-06-03 MED ORDER — ALBUTEROL SULFATE HFA 108 (90 BASE) MCG/ACT IN AERS
2.0000 | INHALATION_SPRAY | RESPIRATORY_TRACT | Status: DC | PRN
  Filled 2019-06-03: qty 6.7

## 2019-06-03 MED ORDER — CARISOPRODOL 350 MG PO TABS
350.0000 mg | ORAL_TABLET | Freq: Four times a day (QID) | ORAL | Status: DC | PRN
  Administered 2019-06-03 – 2019-06-12 (×18): 350 mg via ORAL
  Filled 2019-06-03 (×18): qty 1

## 2019-06-03 MED ORDER — VANCOMYCIN HCL 1250 MG/250ML IV SOLN
1250.0000 mg | Freq: Two times a day (BID) | INTRAVENOUS | Status: DC
  Administered 2019-06-04 – 2019-06-05 (×4): 1250 mg via INTRAVENOUS
  Filled 2019-06-03 (×6): qty 250

## 2019-06-03 NOTE — ED Notes (Signed)
Pt requesting to be placed on O2. Pt placed on 1L [place].

## 2019-06-03 NOTE — H&P (Signed)
History and Physical    Howard Fox [id number] DOB: [birth year] DOA: 06/02/2019  PCP: Unk Pinto, MD   Patient coming from: Home  I have personally briefly reviewed patient's old medical records in [place]  Chief Complaint: fever, short of breath  HPI: Howard Fox is a 59 y.o. 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  Reports onset of symptoms 2 days ago.  Denies any known sick contacts.  Reports associated cough which is mostly nonproductive, myalgias, headaches and fever up to as high as 103.7 at home.  He denies any abdominal pain, nausea, vomiting, diarrhea, sore throat, congestion or other complaints.  Patient reports that he had a bacterial pneumonia last year he had similar symptoms at that time.  Patient denies history of asthma or COPD.  ED Course: Febrile 102.9 F, tachypneic in the 30s to 40s initially, tachycardic 110's, oxygen saturation 91 to 94% on room air.  Labs notable for no leukocytosis, normal lactic acid, normal BNP and troponin, glucose 131.  POC COVID-19 test was negative.  Chest x-ray showed vascular congestion with bilateral perihilar and left lower lobe opacities, edema versus infection.  He was treated with albuterol and Rocephin for presumed community-acquired pneumonia, and admitted for observation to hospitalist service for further evaluation and management.  Review of Systems: As per HPI otherwise 10 point review of systems negative.     Past Medical History:  Diagnosis Date  . Anxiety 10/25/2018  . Arthritis   . Bipolar 1 disorder ([place])   . Depression   . Fracture, ribs 1993   ruptured stomach, left lung resulted from industrial fall  . Heart murmur     as a child  . Hepatitis C   . History of kidney stones   . Hypertension   . Kidney stones   . Low blood pressure 02/20/2019  . MRSA bacteremia 10/09/2018  . S/P lumbar fusion 02/01/2019  . Vertebral osteomyelitis ([place]) 10/25/2018    Past Surgical History:  Procedure Laterality Date  . CHOLECYSTECTOMY    . LAMINECTOMY WITH POSTERIOR LATERAL ARTHRODESIS LEVEL 4 N/A 02/01/2019   Procedure: Laminectomy and Foraminotomy - Thoracic ten-Thoracic eleven, Instrumented fusion Thoracic eight-Lumbar two;  Surgeon: Eustace Moore, MD;  Location: [facility];  Service: Neurosurgery;  Laterality: N/A;  . laser surgery for kidney stones Left   . LITHOTRIPSY       reports that he has been smoking cigarettes. He started smoking about 42 years ago. He has a 21.00 pack-year smoking history. He has never used smokeless tobacco. He reports current alcohol use. He reports current drug use. Frequency: 1.00 time per week. Drug: Marijuana.  Allergies  Allergen Reactions  . Codeine Itching  . Dilaudid [Hydromorphone Hcl] Itching  . Shellfish Allergy Diarrhea and Nausea And Vomiting    Family History  Problem Relation Age of Onset  . Mental illness Mother        Bipolar  . Stroke Father   . Arthritis Father     Prior to Admission medications   Medication Sig Start Date End Date Taking? Authorizing Provider  albuterol (VENTOLIN HFA) 108 (90 Base) MCG/ACT inhaler Use 2 Inhalations 15 minutes apart every 4 hours to Rescue Asthma 02/16/19   Unk Pinto, MD  amitriptyline (ELAVIL) 150  MG tablet TAKE 1 TABLET BY MOUTH AT BEDTIME FOR SLEEP AND MOOD 03/15/19   Liane Comber, NP  aspirin EC 325 MG tablet Take 325 mg by mouth daily.     [provider]  carisoprodol (SOMA) 350 MG tablet Take 350 mg by mouth every 6 (six) hours as needed for muscle spasms.     [provider]  cholecalciferol (VITAMIN D3) 25 MCG (1000 UT) tablet Take 5,000 Units by mouth daily.    [provider]    citalopram (CELEXA) 40 MG tablet Take 1 tablet (40 mg total) by mouth daily with lunch. 04/08/19   Liane Comber, NP  doxycycline (VIBRA-TABS) 100 MG tablet Take 1 tablet (100 mg total) by mouth every 12 (twelve) hours. 02/20/19   Truman Hayward, MD  gabapentin (NEURONTIN) 300 MG capsule Take 1 capsule 3 x /day if needed for Pain 05/05/19   Unk Pinto, MD  ibuprofen (ADVIL,MOTRIN) 200 MG tablet Take 800-1,200 mg by mouth daily after lunch.     [provider]  Menthol-Camphor (ICY HOT ADVANCED RELIEF) 16-11 % CREA Apply 1 application topically 4 (four) times daily as needed (pain).     [provider]  oxyCODONE-acetaminophen (PERCOCET) 10-325 MG tablet Take 1 tablet by mouth every 6 (six) hours as needed for pain. Patient taking differently: Take 1 tablet by mouth every 6 (six) hours as needed for pain. 7.5-325 every 6 hours prn 02/05/19   Eustace Moore, MD  polyethylene glycol (MIRALAX / GLYCOLAX) 17 g packet Take 17 g by mouth daily as needed. Patient taking differently: Take 17 g by mouth daily as needed for mild constipation.  10/15/18   Shelly Coss, MD  rosuvastatin (CRESTOR) 40 MG tablet TAKE 1/2 TO 1 (ONE-HALF TO ONE) TABLET ONCE DAILY OR  AS DIRECTED FOR CHOLESTEROL 05/30/19   Unk Pinto, MD  Sofosbuvir-Velpatasvir (EPCLUSA) 400-100 MG TABS Take 1 tablet by mouth daily. Patient not taking: Reported on 04/08/2019 11/06/18   Truman Hayward, MD    Physical Exam: Vitals:   06/02/19 2229 06/02/19 2232 06/02/19 2327 06/03/19 0039  BP: (!) 145/86  113/75 (!) 155/80  Pulse: (!) 119  (!) 108 (!) 110  Resp: (!) 34  (!) 23 (!) 27  Temp: (!) 102.9 F (39.4 C)   (!) 102.8 F (39.3 C)  TempSrc: Oral   Oral  SpO2: 91%  94% 94%  Weight:  109.8 kg    Height:  6' (1.829 m)      Constitutional: NAD, calm, comfortable Eyes: EOMI, lids and conjunctivae normal ENMT: Mucous membranes are moist.  Hearing grossly normal.  Neck: normal, supple, no cervical  lymphadenopathy Respiratory: Rhonchi in both bases left greater than right, occasionally wheezes, no crackles. Normal respiratory effort. No accessory muscle use.  On room air Cardiovascular: Regular rate and rhythm, no murmurs / rubs / gallops. No extremity edema. 2+ pedal pulses.  Abdomen: Protuberant abdomen, no tenderness,, no guarding or rebound. Bowel sounds positive.  Musculoskeletal: no clubbing / cyanosis. No joint deformity upper and lower extremities.  Skin: Face is flushed warm to touch, dry and intact Neurologic: CN 2-12 grossly intact.  Normal speech.  Psychiatric: Normal judgment and insight. Alert and oriented x 3. Normal mood.    Labs on Admission: I have personally reviewed following labs and imaging studies  CBC: Recent Labs  Lab 06/02/19 2235  WBC 9.9  NEUTROABS 8.5*  HGB 12.8*  HCT 39.4  MCV 88.9  PLT 348  Basic Metabolic Panel: Recent Labs  Lab 06/02/19 2235  NA 134*  K 3.7  CL 99  CO2 24  GLUCOSE 131*  BUN 9  CREATININE 0.82  CALCIUM 9.1   GFR: Estimated Creatinine Clearance: 127.2 mL/min (by C-G formula based on SCr of 0.82 mg/dL). Liver Function Tests: Recent Labs  Lab 06/02/19 2235  AST 28  ALT 21  ALKPHOS 99  BILITOT 0.3  PROT 8.0  ALBUMIN 3.1*   No results for input(s): LIPASE, AMYLASE in the last 168 hours. No results for input(s): AMMONIA in the last 168 hours. Coagulation Profile: No results for input(s): INR, PROTIME in the last 168 hours. Cardiac Enzymes: No results for input(s): CKTOTAL, CKMB, CKMBINDEX, TROPONINI in the last 168 hours. BNP (last 3 results) No results for input(s): PROBNP in the last 8760 hours. HbA1C: No results for input(s): HGBA1C in the last 72 hours. CBG: No results for input(s): GLUCAP in the last 168 hours. Lipid Profile: No results for input(s): CHOL, HDL, LDLCALC, TRIG, CHOLHDL, LDLDIRECT in the last 72 hours. Thyroid Function Tests: No results for input(s): TSH, T4TOTAL, FREET4, T3FREE,  THYROIDAB in the last 72 hours. Anemia Panel: No results for input(s): VITAMINB12, FOLATE, FERRITIN, TIBC, IRON, RETICCTPCT in the last 72 hours. Urine analysis:    Component Value Date/Time   COLORURINE COLORLESS (A) 10/28/2017 1125   APPEARANCEUR CLEAR 10/28/2017 1125   LABSPEC 1.001 (L) 10/28/2017 1125   PHURINE 6.0 10/28/2017 1125   GLUCOSEU NEGATIVE 10/28/2017 1125   HGBUR SMALL (A) 10/28/2017 1125   BILIRUBINUR NEGATIVE 10/28/2017 1125   KETONESUR NEGATIVE 10/28/2017 1125   PROTEINUR NEGATIVE 10/28/2017 1125   UROBILINOGEN 0.2 08/26/2012 1453   NITRITE NEGATIVE 10/28/2017 1125   LEUKOCYTESUR NEGATIVE 10/28/2017 1125    Radiological Exams on Admission: DG Chest Port 1 View  Result Date: 06/02/2019 CLINICAL DATA:  Fever, shortness of breath EXAM: PORTABLE CHEST 1 VIEW COMPARISON:  11/25/2018 FINDINGS: Heart is borderline in size. Mild vascular congestion. Bilateral perihilar opacities. Left basilar opacities. Findings could reflect edema or infection. No visible significant effusions or acute bony abnormality. IMPRESSION: Borderline heart size. Vascular congestion with bilateral perihilar and left lower lobe opacities, edema versus infection. Electronically Signed   By: Rolm Baptise M.D.   On: 06/02/2019 22:58    EKG: Independently reviewed.  Sinus tachycardia, 117 bpm, right axis deviation, wandering baseline, no ST segment elevation  Assessment/Plan Principal Problem:   Community acquired pneumonia Active Problems:   Fever   Dyspnea   Essential hypertension   Bipolar disorder (HCC)   Hyperlipidemia, mixed   Vitamin D deficiency   Chronic pain syndrome   Vertebral osteomyelitis ([place])  Community acquired pneumonia Fever -secondary to above Dyspnea -secondary to above Patient's rapid Covid test was negative in the ED, PCR COVID-19 test is pending.  Chest x-ray did show bilateral infiltrates versus vascular congestion.  Treated with Rocephin in the ED.  No hypoxia upon  admission. --Rocephin and azithromycin --Follow-up COVID-19 PCR test, if positive, add remdesivir and steroid if hypoxic or wheezing --Will go ahead and get baseline inflammatory markers tonight --Sputum culture --Legionella and strep pneumo urinary antigens --Supplemental oxygen as needed --Checking procalcitonin to guide therapy --Follow-up blood cultures --Incentive spirometry and flutter valve --Pulse ox with vital signs   Chronic comorbidities: Essential hypertension Bipolar disorder (HCC) Hyperlipidemia, mixed Vitamin D deficiency Chronic pain syndrome Vertebral osteomyelitis ([place]) -patient has been on doxycycline for MRSA osteomyelitis, but quit taking it 1 week ago due to bothersome side effects.  --  will continue home medications for chronic conditions once med rec is completed   DVT prophylaxis: Lovenox  Code Status: Full  Family Communication: None at bedside, patient requested to call his mother in the morning Disposition Plan: Expect discharge home in 24 to 48 hours, pending clinical improvement Consults called: None  Admission status: obs   Ezekiel Slocumb, DO [facility]   If 7PM-7AM, please contact night-coverage [email or web address] Password [email or web address]  06/03/2019, 1:08 AM

## 2019-06-03 NOTE — Progress Notes (Signed)
PHARMACY - PHYSICIAN COMMUNICATION CRITICAL VALUE ALERT - BLOOD CULTURE IDENTIFICATION (BCID)  Howard Fox is an 59 y.o. male who presented to [facility] on 06/02/2019 with a chief complaint of PNA, hx MRSA infections  Assessment:  MRSA detected  Name of physician (or Provider) Contacted: [facility]  Current antibiotics: Rocephin/Zithromax  Changes to prescribed antibiotics recommended:  Add vancomycin  Results for orders placed or performed during the hospital encounter of 10/07/18  Blood Culture ID Panel (Reflexed) (Collected: 10/08/2018  8:54 AM)  Result Value Ref Range   Enterococcus species NOT DETECTED NOT DETECTED   Listeria monocytogenes NOT DETECTED NOT DETECTED   Staphylococcus species DETECTED (A) NOT DETECTED   Staphylococcus aureus (BCID) DETECTED (A) NOT DETECTED   Methicillin resistance DETECTED (A) NOT DETECTED   Streptococcus species NOT DETECTED NOT DETECTED   Streptococcus agalactiae NOT DETECTED NOT DETECTED   Streptococcus pneumoniae NOT DETECTED NOT DETECTED   Streptococcus pyogenes NOT DETECTED NOT DETECTED   Acinetobacter baumannii NOT DETECTED NOT DETECTED   Enterobacteriaceae species NOT DETECTED NOT DETECTED   Enterobacter cloacae complex NOT DETECTED NOT DETECTED   Escherichia coli NOT DETECTED NOT DETECTED   Klebsiella oxytoca NOT DETECTED NOT DETECTED   Klebsiella pneumoniae NOT DETECTED NOT DETECTED   Proteus species NOT DETECTED NOT DETECTED   Serratia marcescens NOT DETECTED NOT DETECTED   Haemophilus influenzae NOT DETECTED NOT DETECTED   Neisseria meningitidis NOT DETECTED NOT DETECTED   Pseudomonas aeruginosa NOT DETECTED NOT DETECTED   Candida albicans NOT DETECTED NOT DETECTED   Candida glabrata NOT DETECTED NOT DETECTED   Candida krusei NOT DETECTED NOT DETECTED   Candida parapsilosis NOT DETECTED NOT DETECTED   Candida tropicalis NOT DETECTED NOT DETECTED    Kara Mead 06/03/2019  10:02 PM

## 2019-06-03 NOTE — Progress Notes (Signed)
Pharmacy Antibiotic Note  Howard Fox is a 59 y.o. male admitted on 06/02/2019 with MRSA bacteremia.  Pharmacy has been consulted for vanc dosing.  Plan: Vanc 2g x 1 then 1250mg  IV q12 - goal AUC 400-550  Height: 6' (182.9 cm) Weight: 242 lb (109.8 kg) IBW/kg (Calculated) : 77.6  Temp (24hrs), Avg:100 F (37.8 C), Min:97.8 F (36.6 C), Max:102.9 F (39.4 C)  Recent Labs  Lab 06/02/19 2235 06/03/19 0035 06/03/19 0212 06/03/19 0613  WBC 9.9  --  9.5 10.3  CREATININE 0.82  --  0.89 0.76  LATICACIDVEN 1.2 1.1  --   --     Estimated Creatinine Clearance: 130.4 mL/min (by C-G formula based on SCr of 0.76 mg/dL).    Allergies  Allergen Reactions  . Codeine Itching  . Dilaudid [Hydromorphone Hcl] Itching  . Shellfish Allergy Diarrhea and Nausea And Vomiting     Thank you for allowing pharmacy to be a part of this patient's care.  Kara Mead 06/03/2019 10:12 PM

## 2019-06-03 NOTE — Telephone Encounter (Signed)
Patient called to cancel appointment for tomorrow with Dr. Tommy Medal due to currently being in the hospital and would like Dr. Lucianne Lei to know that its not covid related. "Possible pneumonia." Howard Fox

## 2019-06-03 NOTE — ED Notes (Signed)
Attempted to call report with no answer

## 2019-06-03 NOTE — ED Provider Notes (Signed)
12:37 AM Assumed care from Dr. Vanita Panda, please see their note for full history, physical and decision making until this point. In brief this is a 59 y.o. year old male who presented to the ED tonight with Fever and Shortness of Breath     Patient here with fever, tachypnea mild hypoxia and tachycardia consistent with sepsis.  At this time patient overall appears well besides the significant tachypnea.  No respiratory distress.  Not requiring oxygen.  Pending Covid test and the rest of work-up prior to admission.  Work-up as below.  Covid negative we will add on the 6-24-hour PCR test.  Patient on my exam still tachypneic, diminished breath sounds with wheezing.  His heart rate has improved to the low 100s.  His respiratory rate still 20-30 range but is speaking in full sentences.  Patient overall appears well.  Order for blood cultures to be drawn prior to Rocephin and azithromycin for [facility] pneumonia.  Ordered albuterol to see if that helps with his breathing and his diminished breath sounds.  Will hold on prednisone until his next Covid test comes back.    Discussed with hospitalist for admission.  Labs, studies and imaging reviewed by myself and considered in medical decision making if ordered. Imaging interpreted by radiology.  Labs Reviewed  COMPREHENSIVE METABOLIC PANEL - Abnormal; Notable for the following components:      Result Value   Sodium 134 (*)    Glucose, Bld 131 (*)    Albumin 3.1 (*)    All other components within normal limits  CBC WITH DIFFERENTIAL/PLATELET - Abnormal; Notable for the following components:   Hemoglobin 12.8 (*)    Neutro Abs 8.5 (*)    Lymphs Abs 0.6 (*)    All other components within normal limits  CULTURE, BLOOD (ROUTINE X 2)  CULTURE, BLOOD (ROUTINE X 2)  SARS CORONAVIRUS 2 (TAT 6-24 HRS)  LACTIC ACID, PLASMA  BRAIN NATRIURETIC PEPTIDE  LACTIC ACID, PLASMA  URINALYSIS, ROUTINE W REFLEX MICROSCOPIC  POC SARS CORONAVIRUS 2 AG -  ED  TROPONIN  I (HIGH SENSITIVITY)    DG Chest Port 1 View  Final Result      No follow-ups on file.    Dannel Rafter, Corene Cornea, MD 06/03/19 [phone]

## 2019-06-03 NOTE — Progress Notes (Signed)
PHARMACY - PHYSICIAN COMMUNICATION CRITICAL VALUE ALERT - BLOOD CULTURE IDENTIFICATION (BCID)  Howard Fox is an 59 y.o. male who presented to [facility] on 06/02/2019 with a chief complaint of fever, SOB  Assessment:  SARS-2 test was negative. Pt on empiric antibiotics of ceftriaxone + azithromycin for suspected PNA.  BCID + 1/4 (anaerobic bottle) GPC in clusters. No BCID  Panel ran at this time.  Name of physician (or Provider) Contacted: Dr. Earnest Conroy  Current antibiotics: Azithromycin 500 mg IV daily + ceftriaxone 2 g IV daily  Changes to prescribed antibiotics: No changes at this time, likely contaminant.  Howard Fox, PharmD 06/03/2019  6:17 PM

## 2019-06-03 NOTE — Progress Notes (Signed)
PROGRESS NOTE    JAROM ROSENSTOCK  [id number]  DOB: Feb 10, 1961  PCP: Unk Pinto, MD  59 y.o. 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  Reports onset of symptoms 2 days ago.  Denies any known sick contacts.  Reports associated cough which is mostly nonproductive, myalgias, headaches and fever up to as high as 103.7 at home.  Patient reports that he had a bacterial pneumonia last year he had similar symptoms at that time. ED Course: Febrile 102.9 F, tachypneic in the 30s to 40s initially, tachycardic 110's, oxygen saturation 91 to 94% on room air.  Labs notable for no leukocytosis, normal lactic acid, normal BNP and troponin, glucose 131.  POC COVID-19 test was negative.  Chest x-ray showed vascular congestion with bilateral perihilar and left lower lobe opacities, edema versus infection.  He was treated with albuterol and Rocephin for presumed community-acquired pneumonia, and admitted for observation to hospitalist service for further evaluation and management.  Subjective:  Patient resting comfortably when seen in ED, received decadron 6mg  IV x1 , COVID PCR just came back -ve. Feels better after he got some sleep. Willing to get off O2 and be monitored. States stopped taking chronic po doxycycline (Follows Dr Tommy Medal)  few days back per PCP advise as he developed photosensitivity   Objective: Vitals:   06/03/19 0200 06/03/19 0230 06/03/19 0611 06/03/19 1030  BP: 128/80 (!) 158/84 134/81 128/72  Pulse: (!) 108 (!) 107 88 84  Resp: (!) 24 19 (!) 22 (!) 24  Temp:   98.4 F (36.9 C)   TempSrc:   Oral   SpO2: 91% 94% 94% 94%  Weight:      Height:        Intake/Output Summary (Last 24 hours) at  06/03/2019 1129 Last data filed at 06/03/2019 0447 Gross per 24 hour  Intake 350 ml  Output 320 ml  Net 30 ml   Filed Weights   06/02/19 2232  Weight: 109.8 kg    Physical Examination:  General exam: Appears calm and comfortable  Respiratory system: Mild scattered wheezing. Respiratory effort normal. Cardiovascular system: S1 & S2 heard, RRR. No JVD, murmurs, rubs, gallops or clicks. No pedal edema. Gastrointestinal system: Abdomen is nondistended, soft and nontender. Normal bowel sounds heard. Central nervous system: Alert and oriented. No new focal neurological deficits. Extremities: No contractures, edema or joint deformities.  Skin: No rashes, lesions or ulcers Psychiatry: Judgement and insight appear normal. Mood & affect appropriate.   Data Reviewed: I have personally reviewed following labs and imaging studies  CBC: Recent Labs  Lab 06/02/19 2235 06/03/19 0212 06/03/19 0613  WBC 9.9 9.5 10.3  NEUTROABS 8.5*  --  8.5*  HGB 12.8* 11.9* 12.7*  HCT 39.4 37.6* 39.1  MCV 88.9 90.0 89.1  PLT 348 345 0000000   Basic Metabolic Panel: Recent Labs  Lab 06/02/19 2235 06/03/19 0212 06/03/19 0613  NA 134*  --  136  K 3.7  --  3.5  CL 99  --  99  CO2 24  --  23  GLUCOSE 131*  --  147*  BUN 9  --  8  CREATININE 0.82 0.89 0.76  CALCIUM 9.1  --  8.7*   GFR: Estimated Creatinine Clearance: 130.4 mL/min (by C-G formula  based on SCr of 0.76 mg/dL). Liver Function Tests: Recent Labs  Lab 06/02/19 2235  AST 28  ALT 21  ALKPHOS 99  BILITOT 0.3  PROT 8.0  ALBUMIN 3.1*   No results for input(s): LIPASE, AMYLASE in the last 168 hours. No results for input(s): AMMONIA in the last 168 hours. Coagulation Profile: No results for input(s): INR, PROTIME in the last 168 hours. Cardiac Enzymes: No results for input(s): CKTOTAL, CKMB, CKMBINDEX, TROPONINI in the last 168 hours. BNP (last 3 results) No results for input(s): PROBNP in the last 8760 hours. HbA1C: No results for  input(s): HGBA1C in the last 72 hours. CBG: No results for input(s): GLUCAP in the last 168 hours. Lipid Profile: No results for input(s): CHOL, HDL, LDLCALC, TRIG, CHOLHDL, LDLDIRECT in the last 72 hours. Thyroid Function Tests: No results for input(s): TSH, T4TOTAL, FREET4, T3FREE, THYROIDAB in the last 72 hours. Anemia Panel: Recent Labs    06/03/19 0113  FERRITIN 87   Sepsis Labs: Recent Labs  Lab 06/02/19 2235 06/03/19 0035 06/03/19 0212 06/03/19 0613  PROCALCITON  --   --  0.14 0.16  LATICACIDVEN 1.2 1.1  --   --     Recent Results (from the past 240 hour(s))  Blood culture (routine x 2)     Status: None (Preliminary result)   Collection Time: 06/02/19 10:45 PM   Specimen: BLOOD  Result Value Ref Range Status   Specimen Description   Final    BLOOD LEFT ANTECUBITAL Performed at [facility], [place] [address], [place], [place] [address]    Special Requests   Final    BOTTLES DRAWN AEROBIC AND ANAEROBIC Blood Culture adequate volume Performed at [facility], [place] [address], [place], [place] [address]    Culture PENDING  Incomplete   Report Status PENDING  Incomplete  Blood culture (routine x 2)     Status: None (Preliminary result)   Collection Time: 06/03/19 12:05 AM   Specimen: BLOOD  Result Value Ref Range Status   Specimen Description   Final    BLOOD RIGHT ANTECUBITAL Performed at [facility], [place] [address] [place], [place] [address]    Special Requests   Final    BOTTLES DRAWN AEROBIC AND ANAEROBIC Blood Culture adequate volume Performed at [place] [address], [place], [place] [address]    Culture PENDING  Incomplete   Report Status PENDING  Incomplete  SARS CORONAVIRUS 2 (TAT 6-24 HRS) Nasopharyngeal Nasopharyngeal Swab     Status: None   Collection Time: 06/03/19 12:35 AM   Specimen: Nasopharyngeal Swab  Result Value Ref Range Status   SARS Coronavirus 2 NEGATIVE NEGATIVE Final     Comment: (NOTE) SARS-CoV-2 target nucleic acids are NOT DETECTED. The SARS-CoV-2 RNA is generally detectable in upper and lower respiratory specimens during the acute phase of infection. Negative results do not preclude SARS-CoV-2 infection, do not rule out co-infections with other pathogens, and should not be used as the sole basis for treatment or other patient management decisions. Negative results must be combined with clinical observations, patient history, and epidemiological information. The expected result is Negative. Fact Sheet for Patients: SugarRoll.be Fact Sheet for Healthcare Providers: https://www.woods-mathews.com/ This test is not yet approved or cleared by the [place] FDA and  has been authorized for detection and/or diagnosis of SARS-CoV-2 by FDA under an Emergency Use Authorization (EUA). This EUA will remain  in effect (meaning this test can be used) for  the duration of the COVID-19 declaration under Section 56 4(b)(1) of the Act, 21 U.S.C. section 360bbb-3(b)(1), unless the authorization is terminated or revoked sooner. Performed at [facility], [place] [address] [place], [place] [address]   Expectorated sputum assessment w rflx to resp cult     Status: None   Collection Time: 06/03/19  4:41 AM   Specimen: Expectorated Sputum  Result Value Ref Range Status   Specimen Description EXPECTORATED SPUTUM  Final   Special Requests NONE  Final   Sputum evaluation   Final    Sputum specimen not acceptable for testing.  Please recollect.   NOFIFIED [facility] AT [id number] ON 06/03/19 BY A,MOHAMED Performed at [facility], [place] [address] [place],  [address]    Report Status 06/03/2019 FINAL  Final      Radiology Studies: DG Chest Port 1 View  Result Date: 06/02/2019 CLINICAL DATA:  Fever, shortness of breath EXAM: PORTABLE CHEST 1 VIEW COMPARISON:  11/25/2018 FINDINGS: Heart is borderline in  size. Mild vascular congestion. Bilateral perihilar opacities. Left basilar opacities. Findings could reflect edema or infection. No visible significant effusions or acute bony abnormality. IMPRESSION: Borderline heart size. Vascular congestion with bilateral perihilar and left lower lobe opacities, edema versus infection. Electronically Signed   By: Rolm Baptise M.D.   On: 06/02/2019 22:58        Scheduled Meds: . amitriptyline  150 mg Oral QHS  . aspirin EC  325 mg Oral Daily  . citalopram  20 mg Oral Q lunch  . enoxaparin (LOVENOX) injection  40 mg Subcutaneous Q24H  . gabapentin  300 mg Oral TID  . ibuprofen  800 mg Oral QPC lunch  . Ipratropium-Albuterol  1 puff Inhalation Q6H  . rosuvastatin  40 mg Oral Daily  . cholecalciferol  5,000 Units Oral Daily   Continuous Infusions: . azithromycin    . cefTRIAXone (ROCEPHIN)  IV      Assessment & Plan:   Taper off O2 and monitor COVID PCR -ve but has significantly elevated inflammtory markers (could be related to recent vertebral infection/intervention as well) Will check resp virus panel and change precautions to droplet/contact until then  Continue empiric abx. Gave 1 dose decadron earlier today.  Will touch base with DR Drucilla Schmidt regarding chronic abx therapy     LOS: 0 days    Time spent:     Guilford Shi, MD [facility] Pager [phone]  If 7PM-7AM, please contact night-coverage [email or web address] Password [email or web address] 06/03/2019, 11:29 AM

## 2019-06-04 ENCOUNTER — Inpatient Hospital Stay (HOSPITAL_COMMUNITY): Payer: Medicare Other

## 2019-06-04 ENCOUNTER — Ambulatory Visit: Payer: Medicare Other | Admitting: Infectious Disease

## 2019-06-04 DIAGNOSIS — F319 Bipolar disorder, unspecified: Secondary | ICD-10-CM

## 2019-06-04 DIAGNOSIS — F17210 Nicotine dependence, cigarettes, uncomplicated: Secondary | ICD-10-CM

## 2019-06-04 DIAGNOSIS — K5900 Constipation, unspecified: Secondary | ICD-10-CM

## 2019-06-04 DIAGNOSIS — G894 Chronic pain syndrome: Secondary | ICD-10-CM

## 2019-06-04 DIAGNOSIS — R7881 Bacteremia: Secondary | ICD-10-CM

## 2019-06-04 DIAGNOSIS — Z8614 Personal history of Methicillin resistant Staphylococcus aureus infection: Secondary | ICD-10-CM

## 2019-06-04 DIAGNOSIS — Z981 Arthrodesis status: Secondary | ICD-10-CM

## 2019-06-04 DIAGNOSIS — Z8739 Personal history of other diseases of the musculoskeletal system and connective tissue: Secondary | ICD-10-CM

## 2019-06-04 DIAGNOSIS — F1911 Other psychoactive substance abuse, in remission: Secondary | ICD-10-CM

## 2019-06-04 DIAGNOSIS — B9562 Methicillin resistant Staphylococcus aureus infection as the cause of diseases classified elsewhere: Secondary | ICD-10-CM

## 2019-06-04 DIAGNOSIS — R350 Frequency of micturition: Secondary | ICD-10-CM

## 2019-06-04 LAB — CREATININE, SERUM
Creatinine, Ser: 0.82 mg/dL (ref 0.61–1.24)
GFR calc Af Amer: >60 mL/min (ref >60)
GFR calc non Af Amer: >60 mL/min (ref >60)

## 2019-06-04 LAB — PROCALCITONIN: Procalcitonin: <0.1 ng/mL

## 2019-06-04 LAB — ECHOCARDIOGRAM COMPLETE
Height: 72 in
Weight: 3872 oz

## 2019-06-04 LAB — MRSA PCR SCREENING: MRSA by PCR: NEGATIVE

## 2019-06-04 MED ORDER — POLYETHYLENE GLYCOL 3350 17 G PO PACK
17.0000 g | PACK | Freq: Every day | ORAL | Status: DC
  Administered 2019-06-04 – 2019-06-12 (×9): 17 g via ORAL
  Filled 2019-06-04 (×9): qty 1

## 2019-06-04 NOTE — Progress Notes (Signed)
*  2D Echocardiogram has been performed.  Howard Fox 06/04/2019, 3:53 PM

## 2019-06-04 NOTE — Progress Notes (Signed)
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  Symptoms have been present for the past couple of days, progressive, and came to the hospital.  His symptoms remind him of his pneumonia from last year.  In the ED he was febrile, tachypneic, hypoxic requiring supplemental oxygen.  COVID-19 was negative.  He was admitted to the hospital and placed on broad-spectrum antibiotics for Pneumonia and after admission his blood cultures showed MRSA bacteremia.  Subjective / 24h Interval events: He is feeling a little bit better this morning.  He tells me he self discontinued his doxycycline about 3 weeks ago due to skin rash, and per patient try to contact Dr. Drucilla Schmidt but they played "phone tag" and he was unable to get in touch with him.  Feels like his rash is improved after stopping the doxycycline.  He complains of a dry cough and mild shortness of breath currently.  Assessment & Plan: Principal Problem Acute hypoxic respiratory failure due to pneumonia -Patient initially started on ceftriaxone and azithromycin, continue, defer narrowing to ID, it is also possible that he has MRSA pneumonia versus septic emboli in the setting of bacteremia -supportive treatment, wean off oxygen as tolerated  Active Problems Sepsis due to MRSA bacteremia, history of T-spine discitis -Patient was supposed to be on prolonged course of doxycycline however self discontinued this medication about 3 weeks ago. 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 -Defer to ID for the treatment  History of hepatitis C -Status post treatment, tested negative  Hyperlipidemia -Continue statin  Chronic pain syndrome -Continue home medications  History of IVDU -Patient upset about this being his medical chart and has not used in a "long long time"   Scheduled Meds: . amitriptyline  150 mg Oral QHS  . aspirin EC  325 mg Oral Daily  . citalopram  20 mg Oral Q lunch  . enoxaparin (LOVENOX) injection  40 mg Subcutaneous Q24H  . gabapentin  300 mg Oral TID  . ibuprofen  800 mg Oral QPC lunch  . Ipratropium-Albuterol  1 puff Inhalation Q6H  . polyethylene glycol  17 g Oral Daily  . rosuvastatin  40 mg Oral Daily  . cholecalciferol  5,000 Units Oral Daily   Continuous Infusions: . azithromycin 500 mg (06/03/19 2209)  . cefTRIAXone (ROCEPHIN)  IV 2 g (06/03/19 2125)  . vancomycin 1,250 mg (06/04/19 0935)   PRN Meds:.albuterol, carisoprodol, oxyCODONE  DVT prophylaxis: Lovenox Code Status: Full code Family Communication: no family at bedside  Disposition Plan: home when ready   Consultants:  ID  Procedures:  2D echo: pending  Microbiology  Blood cultures 1/11-staph aureus, BC ID shows methicillin resistance SARS-CoV-2 1/11-negative  Antimicrobials: Ceftriaxone / Azithromycin 1/11 >> Vancomycin 1/12 >>    Objective: Vitals:   06/04/19 0607 06/04/19 0629 06/04/19 0814 06/04/19 1210  BP: 130/71   132/75  Pulse: 98   95  Resp: 16   20  Temp: (!) 100.8 F (38.2 C) 100.3 F (37.9 C)  99.1 F (37.3 C)  TempSrc: Oral   Oral  SpO2: Marland Kitchen)  88% 90% (!) 84% (!) 87%  Weight:      Height:        Intake/Output Summary (Last 24 hours) at 06/04/2019 1259 Last data filed at 06/04/2019 1100 Gross per 24 hour  Intake 1080 ml  Output 1700 ml  Net -620 ml   Filed Weights   06/02/19 2232  Weight: 109.8 kg    Examination:  Constitutional: NAD Eyes: no scleral icterus ENMT: Mucous membranes are moist.  Neck: normal,  supple Respiratory: Faint bibasilar rhonchi, no wheezing Cardiovascular: Regular rate and rhythm, no murmurs / rubs / gallops. No LE edema.  Abdomen: non distended, no tenderness. Bowel sounds positive.  Musculoskeletal: no clubbing / cyanosis.  Skin: no rashes Neurologic: CN 2-12 grossly intact. Strength 5/5 in all 4.  Psychiatric: Normal judgment and insight. Alert and oriented x 3. Normal mood.    Data Reviewed: I have independently reviewed following labs and imaging studies   CBC: Recent Labs  Lab 06/02/19 2235 06/03/19 0212 06/03/19 0613  WBC 9.9 9.5 10.3  NEUTROABS 8.5*  --  8.5*  HGB 12.8* 11.9* 12.7*  HCT 39.4 37.6* 39.1  MCV 88.9 90.0 89.1  PLT 348 345 0000000   Basic Metabolic Panel: Recent Labs  Lab 06/02/19 2235 06/03/19 0212 06/03/19 0613 06/04/19 0519  NA 134*  --  136  --   K 3.7  --  3.5  --   CL 99  --  99  --   CO2 24  --  23  --   GLUCOSE 131*  --  147*  --   BUN 9  --  8  --   CREATININE 0.82 0.89 0.76 0.82  CALCIUM 9.1  --  8.7*  --    Liver Function Tests: Recent Labs  Lab 06/02/19 2235  AST 28  ALT 21  ALKPHOS 99  BILITOT 0.3  PROT 8.0  ALBUMIN 3.1*   Coagulation Profile: No results for input(s): INR, PROTIME in the last 168 hours. HbA1C: No results for input(s): HGBA1C in the last 72 hours. CBG: No results for input(s): GLUCAP in the last 168 hours.  Recent Results (from the past 240 hour(s))  Blood culture (routine x 2)     Status: None (Preliminary result)   Collection Time: 06/02/19 10:45 PM   Specimen: BLOOD  Result Value Ref Range Status   Specimen Description   Final    BLOOD LEFT ANTECUBITAL Performed at [facility] [address] [place], [place] [address]    Special Requests   Final    BOTTLES DRAWN AEROBIC AND ANAEROBIC Blood Culture adequate volume   Culture  Setup Time   Final    GRAM POSITIVE COCCI IN CLUSTERS IN BOTH AEROBIC AND ANAEROBIC BOTTLES CRITICAL RESULT CALLED TO, READ BACK BY  AND VERIFIED WITH: Shelda Jakes PHARMD 1640 06/03/19 A BROWNING Performed at [facility], [place] [address], [place], [place] [address]    Culture GRAM POSITIVE COCCI  Final   Report Status PENDING  Incomplete  Blood culture (routine x 2)     Status: Abnormal (Preliminary result)   Collection Time: 06/03/19 12:05 AM   Specimen: BLOOD  Result Value Ref Range Status   Specimen Description   Final    BLOOD RIGHT ANTECUBITAL Performed at [facility], [place] [address] [place], [place] [address]    Special Requests   Final    BOTTLES DRAWN AEROBIC AND ANAEROBIC Blood Culture adequate volume Performed at [facility], [place] [address] [place], [place] [address]    Culture  Setup Time   Final    GRAM POSITIVE COCCI IN CLUSTERS IN BOTH AEROBIC AND ANAEROBIC BOTTLES CRITICAL RESULT CALLED TO, READ BACK BY AND VERIFIED WITH: J LEGGE PHARMD 2200 06/03/19 A BROWNING Performed at [facility], [place] [address], [place], [place] [address]    Culture STAPHYLOCOCCUS AUREUS (A)  Final   Report Status PENDING  Incomplete  Blood Culture ID Panel (Reflexed)     Status: Abnormal   Collection Time: 06/03/19 12:05 AM  Result Value Ref Range Status   Enterococcus species NOT DETECTED NOT DETECTED Final   Listeria monocytogenes NOT DETECTED NOT DETECTED Final   Staphylococcus species DETECTED (A) NOT DETECTED Final    Comment: CRITICAL RESULT CALLED TO, READ BACK BY AND VERIFIED WITH: J LEGGE PHARMD 2200 06/03/19 A BROWNING    Staphylococcus aureus (BCID) DETECTED (A) NOT DETECTED Final    Comment: Methicillin (oxacillin)-resistant Staphylococcus aureus (MRSA). MRSA is predictably resistant to beta-lactam antibiotics (except ceftaroline). Preferred therapy is vancomycin unless clinically contraindicated. Patient requires contact precautions if  hospitalized. CRITICAL RESULT CALLED TO, READ BACK BY AND VERIFIED WITH: J LEGGE PHARMD 2200 06/03/19 A BROWNING    Methicillin  resistance DETECTED (A) NOT DETECTED Final    Comment: CRITICAL RESULT CALLED TO, READ BACK BY AND VERIFIED WITH: J LEGGE PHARMD 2200 06/03/19 A BROWNING    Streptococcus species NOT DETECTED NOT DETECTED Final   Streptococcus agalactiae NOT DETECTED NOT DETECTED Final   Streptococcus pneumoniae NOT DETECTED NOT DETECTED Final   Streptococcus pyogenes NOT DETECTED NOT DETECTED Final   Acinetobacter baumannii NOT DETECTED NOT DETECTED Final   Enterobacteriaceae species NOT DETECTED NOT DETECTED Final   Enterobacter cloacae complex NOT DETECTED NOT DETECTED Final   Escherichia coli NOT DETECTED NOT DETECTED Final   Klebsiella oxytoca NOT DETECTED NOT DETECTED Final   Klebsiella pneumoniae NOT DETECTED NOT DETECTED Final   Proteus species NOT DETECTED NOT DETECTED Final   Serratia marcescens NOT DETECTED NOT DETECTED Final   Haemophilus influenzae NOT DETECTED NOT DETECTED Final   Neisseria meningitidis NOT DETECTED NOT DETECTED Final   Pseudomonas aeruginosa NOT DETECTED NOT DETECTED Final   Candida albicans NOT DETECTED NOT DETECTED Final   Candida glabrata NOT DETECTED NOT DETECTED Final   Candida krusei NOT DETECTED NOT DETECTED Final   Candida parapsilosis NOT DETECTED NOT DETECTED Final   Candida tropicalis NOT DETECTED NOT DETECTED Final    Comment: Performed at [facility], [place]. [address] [place], [place] [address]  SARS CORONAVIRUS 2 (TAT 6-24 HRS) Nasopharyngeal Nasopharyngeal Swab     Status: None   Collection Time: 06/03/19 12:35 AM   Specimen: Nasopharyngeal Swab  Result Value Ref Range Status   SARS Coronavirus 2 NEGATIVE NEGATIVE Final    Comment: (NOTE) SARS-CoV-2 target nucleic acids are NOT DETECTED. The SARS-CoV-2 RNA is generally detectable in upper and lower respiratory specimens during the acute phase of infection. Negative results do not preclude SARS-CoV-2 infection, do not rule out co-infections with other pathogens, and should not be used as the sole  basis for treatment or other patient management decisions. Negative results must be combined with clinical observations, patient history, and epidemiological information. The expected result is Negative. Fact Sheet for Patients: SugarRoll.be Fact Sheet for Healthcare Providers: https://www.woods-mathews.com/ This test is not yet approved or cleared by the [place] FDA and  has been authorized for detection and/or diagnosis of SARS-CoV-2  by FDA under an Emergency Use Authorization (EUA). This EUA will remain  in effect (meaning this test can be used) for the duration of the COVID-19 declaration under Section 56 4(b)(1) of the Act, 21 U.S.C. section 360bbb-3(b)(1), unless the authorization is terminated or revoked sooner. Performed at [facility], [place] [address], [place], [place] [address]   Expectorated sputum assessment w rflx to resp cult     Status: None   Collection Time: 06/03/19  4:41 AM   Specimen: Expectorated Sputum  Result Value Ref Range Status   Specimen Description EXPECTORATED SPUTUM  Final   Special Requests NONE  Final   Sputum evaluation   Final    Sputum specimen not acceptable for testing.  Please recollect.   NOFIFIED [facility] ER AT [id number] ON 06/03/19 BY A,MOHAMED Performed at [facility], [place] [address], [place], [place] [address]    Report Status 06/03/2019 FINAL  Final  Respiratory Panel by PCR     Status: None   Collection Time: 06/03/19 10:12 AM   Specimen: Nasopharyngeal Swab; Respiratory  Result Value Ref Range Status   Adenovirus NOT DETECTED NOT DETECTED Final   Coronavirus 229E NOT DETECTED NOT DETECTED Final    Comment: (NOTE) The Coronavirus on the Respiratory Panel, DOES NOT test for the novel  Coronavirus (2019 nCoV)    Coronavirus HKU1 NOT DETECTED NOT DETECTED Final   Coronavirus NL63 NOT DETECTED NOT DETECTED Final   Coronavirus OC43 NOT DETECTED NOT DETECTED Final    Metapneumovirus NOT DETECTED NOT DETECTED Final   Rhinovirus / Enterovirus NOT DETECTED NOT DETECTED Final   Influenza A NOT DETECTED NOT DETECTED Final   Influenza B NOT DETECTED NOT DETECTED Final   Parainfluenza Virus 1 NOT DETECTED NOT DETECTED Final   Parainfluenza Virus 2 NOT DETECTED NOT DETECTED Final   Parainfluenza Virus 3 NOT DETECTED NOT DETECTED Final   Parainfluenza Virus 4 NOT DETECTED NOT DETECTED Final   Respiratory Syncytial Virus NOT DETECTED NOT DETECTED Final   Bordetella pertussis NOT DETECTED NOT DETECTED Final   Chlamydophila pneumoniae NOT DETECTED NOT DETECTED Final   Mycoplasma pneumoniae NOT DETECTED NOT DETECTED Final    Comment: Performed at [facility], [place]. [address] [place], [place] [address]  Culture, respiratory     Status: None (Preliminary result)   Collection Time: 06/03/19  4:40 PM   Specimen: SPU  Result Value Ref Range Status   Specimen Description   Final    SPUTUM Performed at [place] [address], [place], [place] [address]    Special Requests   Final    NONE Performed at [facility], [place] [address] [place], [place] [address]    Gram Stain   Final    RARE WBC PRESENT, PREDOMINANTLY PMN RARE GRAM POSITIVE COCCI IN PAIRS RARE BUDDING YEAST SEEN    Culture   Final    CULTURE REINCUBATED FOR BETTER GROWTH Performed at [facility], [place] [address], [place], [place] [address]    Report Status PENDING  Incomplete  MRSA PCR Screening     Status: None   Collection Time: 06/03/19  8:11 PM   Specimen: Nasopharyngeal  Result Value Ref Range Status   MRSA by PCR NEGATIVE NEGATIVE Final    Comment:        The GeneXpert MRSA Assay (FDA approved for NASAL specimens only), is one component of a comprehensive MRSA colonization surveillance program. It is not intended  to diagnose MRSA infection nor to guide or monitor treatment for MRSA infections. Performed at [facility], [place] [address], [place], [place] [address]      Radiology Studies: No results found.  Marzetta Board, MD, PhD [facility]  Between 7 am - 7 pm I am available, please contact me via Amion or Securechat  Between 7 pm - 7 am I am not available, please contact night coverage MD/APP via Amion

## 2019-06-04 NOTE — Telephone Encounter (Signed)
It looks like he has a Staph aureus bacteremia so our team will be seeing him for sure

## 2019-06-04 NOTE — Consult Note (Addendum)
[facility]    Date of Admission:  06/02/2019   Total days of antibiotics: 2        ceftriaxone/azithro/vanco               Reason for Consult: Staph aureus/MRSA bacteremia    Referring Provider: CHAMP!   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   Thank you so much for this interesting consult,  Principal Problem:   Community acquired pneumonia Active Problems:   Bipolar disorder ([place])   Essential hypertension   Hyperlipidemia, mixed   Vitamin D deficiency   Vertebral osteomyelitis (H[facility])   Fever   Dyspnea   Chronic pain syndrome   Pneumonia   . amitriptyline  150 mg Oral QHS  . aspirin EC  325 mg Oral Daily  . citalopram  20 mg Oral Q lunch  . enoxaparin (LOVENOX) injection  40 mg Subcutaneous Q24H  . gabapentin  300 mg Oral TID  . ibuprofen  800 mg Oral QPC lunch  . Ipratropium-Albuterol  1 puff Inhalation Q6H  . polyethylene glycol  17 g Oral Daily  . rosuvastatin  40 mg Oral Daily  . cholecalciferol  5,000 Units Oral Daily    HPI: Howard Fox is a 59 y.o. male with hx of IVDA, prev T10-11 osteo/diskitis and MRSA bacteremia (10-08-18) on chronic doxy (stopped 3 weeks prior due to photsensitivity), comes to hospital on 1-11 with temps to 103.7, SOB and chills at home. He was noted to have tachypnea, mild hypoxia (91% on RA) and bilateral perihilar and LLL opacities (edema vs infection). He was started on anbx for CAP.  He is now found to have 4/4 BCx+ for MRSA.  TTE   He had T8-L2 fusion 02-01-19 due to back pain. His Cx were negative at that time.   Review of Systems: Review of Systems  Constitutional: Positive for fever.  Respiratory: Negative for cough.     Gastrointestinal: Positive for constipation. Negative for diarrhea.  Genitourinary: Positive for frequency. Negative for dysuria.  Neurological: Negative for sensory change.  Please see HPI. All other systems reviewed and negative.   Past Medical History:  Diagnosis Date  . Anxiety 10/25/2018  . Arthritis   . Bipolar 1 disorder ([place])   . Depression   . Fracture, ribs 1993   ruptured stomach, left lung resulted from industrial fall  . Heart murmur    as a child  . Hepatitis C   . History of kidney stones   . Hypertension   . Kidney stones   . Low blood pressure 02/20/2019  . MRSA bacteremia 10/09/2018  . S/P lumbar fusion 02/01/2019  . Vertebral osteomyelitis ([place]) 10/25/2018    Social History   Tobacco Use  . Smoking status: Current Every Day Smoker    Packs/day: 0.50    Years: 42.00    Pack years: 21.00    Types: Cigarettes    Start date: 01/02/1977  . Smokeless tobacco: Never Used  Substance Use Topics  . Alcohol use: Yes    Comment: 2 beers a day  . Drug use: Yes    Frequency: 1.0 times per week    Types: Marijuana    Comment: last time  2 weeks ago    Family History  Problem Relation Age of Onset  . Mental illness Mother        Bipolar  . Stroke Father   . Arthritis Father      Medications:  Scheduled: . amitriptyline  150 mg Oral QHS  . aspirin EC  325 mg Oral Daily  . citalopram  20 mg Oral Q lunch  . enoxaparin (LOVENOX) injection  40 mg Subcutaneous Q24H  . gabapentin  300 mg Oral TID  . ibuprofen  800 mg Oral QPC lunch  . Ipratropium-Albuterol  1 puff Inhalation Q6H  . polyethylene glycol  17 g Oral Daily  . rosuvastatin  40 mg Oral Daily  . cholecalciferol  5,000 Units Oral Daily    Abtx:  Anti-infectives (From admission, onward)   Start     Dose/Rate Route Frequency Ordered Stop   06/04/19 1000  vancomycin (VANCOREADY) IVPB 1250 mg/250 mL     1,250 mg 166.7 mL/hr over 90 Minutes Intravenous Every 12 hours 06/03/19 2214     06/03/19 2300   vancomycin (VANCOREADY) IVPB 2000 mg/400 mL     2,000 mg 200 mL/hr over 120 Minutes Intravenous  Once 06/03/19 2214 06/04/19 0941   06/03/19 2200  cefTRIAXone (ROCEPHIN) 2 g in sodium chloride 0.9 % 100 mL IVPB  Status:  Discontinued    Note to Pharmacy: Please time according to dose given in ED   2 g 200 mL/hr over 30 Minutes Intravenous Every 24 hours 06/03/19 0037 06/04/19 1650   06/03/19 2200  azithromycin (ZITHROMAX) 500 mg in sodium chloride 0.9 % 250 mL IVPB  Status:  Discontinued    Note to Pharmacy: Please time according to dose given in ED   500 mg 250 mL/hr over 60 Minutes Intravenous Every 24 hours 06/03/19 0037 06/04/19 1650   06/03/19 0000  cefTRIAXone (ROCEPHIN) 2 g in sodium chloride 0.9 % 100 mL IVPB     2 g 200 mL/hr over 30 Minutes Intravenous  Once 06/02/19 2359 06/03/19 0110   06/03/19 0000  azithromycin (ZITHROMAX) 500 mg in sodium chloride 0.9 % 250 mL IVPB     500 mg 250 mL/hr over 60 Minutes Intravenous  Once 06/02/19 2359 06/03/19 0233        OBJECTIVE: Blood pressure 132/75, pulse 95, temperature 99.1 F (37.3 C), temperature source Oral, resp. rate 20, height 6' (1.829 m), weight 109.8 kg, SpO2 91 %.  Physical Exam Constitutional:      General: He is not in acute distress.    Appearance: He is well-developed. He is obese. He is not toxic-appearing.  HENT:     Mouth/Throat:     Mouth: Mucous membranes are moist.     Pharynx: No oropharyngeal exudate.  Eyes:     Extraocular Movements: Extraocular movements intact.     Pupils: Pupils are equal, round, and reactive to light.  Cardiovascular:     Rate and Rhythm: Normal rate and regular rhythm.  Pulmonary:     Effort: Pulmonary effort is normal.     Breath sounds: Wheezing present.  Abdominal:     General: Bowel sounds are normal. There is no distension.     Palpations: Abdomen is soft.     Tenderness: There is no abdominal tenderness.  Musculoskeletal:     Cervical back: Normal range of motion  and neck supple.     Right lower leg: No edema.     Left lower leg: No edema.  Neurological:  General: No focal deficit present.     Mental Status: He is alert.  Psychiatric:        Mood and Affect: Mood normal.     Lab Results Results for orders placed or performed during the hospital encounter of 06/02/19 (from the past 48 hour(s))  Lactic acid, plasma     Status: None   Collection Time: 06/02/19 10:35 PM  Result Value Ref Range   Lactic Acid, Venous 1.2 0.5 - 1.9 mmol/L    Comment: Performed at [facility], [place] [address], [place], [place] [address]  Comprehensive metabolic panel     Status: Abnormal   Collection Time: 06/02/19 10:35 PM  Result Value Ref Range   Sodium 134 (L) 135 - 145 mmol/L   Potassium 3.7 3.5 - 5.1 mmol/L   Chloride 99 98 - 111 mmol/L   CO2 24 22 - 32 mmol/L   Glucose, Bld 131 (H) 70 - 99 mg/dL   BUN 9 6 - 20 mg/dL   Creatinine, Ser 0.82 0.61 - 1.24 mg/dL   Calcium 9.1 8.9 - 10.3 mg/dL   Total Protein 8.0 6.5 - 8.1 g/dL   Albumin 3.1 (L) 3.5 - 5.0 g/dL   AST 28 15 - 41 U/L   ALT 21 0 - 44 U/L   Alkaline Phosphatase 99 38 - 126 U/L   Total Bilirubin 0.3 0.3 - 1.2 mg/dL   GFR calc non Af Amer >60 >60 mL/min   GFR calc Af Amer >60 >60 mL/min   Anion gap 11 5 - 15    Comment: Performed at [facility], [place] [address] [place], [place] [address]  CBC with Differential     Status: Abnormal   Collection Time: 06/02/19 10:35 PM  Result Value Ref Range   WBC 9.9 4.0 - 10.5 K/uL   RBC 4.43 4.22 - 5.81 MIL/uL   Hemoglobin 12.8 (L) 13.0 - 17.0 g/dL   HCT 39.4 39.0 - 52.0 %   MCV 88.9 80.0 - 100.0 fL   MCH 28.9 26.0 - 34.0 pg   MCHC 32.5 30.0 - 36.0 g/dL   RDW 14.0 11.5 - 15.5 %   Platelets 348 150 - 400 K/uL   nRBC 0.0 0.0 - 0.2 %   Neutrophils Relative % 85 %   Neutro Abs 8.5 (H) 1.7 - 7.7 K/uL   Lymphocytes Relative 6 %   Lymphs Abs 0.6 (L) 0.7 - 4.0 K/uL   Monocytes Relative 7 %   Monocytes Absolute 0.6  0.1 - 1.0 K/uL   Eosinophils Relative 1 %   Eosinophils Absolute 0.1 0.0 - 0.5 K/uL   Basophils Relative 1 %   Basophils Absolute 0.1 0.0 - 0.1 K/uL   Immature Granulocytes 0 %   Abs Immature Granulocytes 0.04 0.00 - 0.07 K/uL    Comment: Performed at [facility], [place] [address], [place], [place] [address]  Troponin I (High Sensitivity)     Status: None   Collection Time: 06/02/19 10:35 PM  Result Value Ref Range   Troponin I (High Sensitivity) 4 <18 ng/L    Comment: (NOTE) Elevated high sensitivity troponin I (hsTnI) values and significant  changes across serial measurements may suggest ACS but many other  chronic and acute conditions are known to elevate hsTnI results.  Refer to the "Links" section for chest pain algorithms and additional  guidance. Performed at [facility], [place] [address], [place], [place] [address]   Brain natriuretic  peptide     Status: None   Collection Time: 06/02/19 10:37 PM  Result Value Ref Range   B Natriuretic Peptide 31.0 0.0 - 100.0 pg/mL    Comment: Performed at [facility], [place] [address], [place], [place] [address]  Blood culture (routine x 2)     Status: Abnormal (Preliminary result)   Collection Time: 06/02/19 10:45 PM   Specimen: BLOOD  Result Value Ref Range   Specimen Description      BLOOD LEFT ANTECUBITAL Performed at [facility], [place] [address], [place], [place] [address]    Special Requests      BOTTLES DRAWN AEROBIC AND ANAEROBIC Blood Culture adequate volume   Culture  Setup Time      GRAM POSITIVE COCCI IN CLUSTERS IN BOTH AEROBIC AND ANAEROBIC BOTTLES CRITICAL RESULT CALLED TO, READ BACK BY AND VERIFIED WITH: Shelda Jakes PHARMD 1640 06/03/19 A BROWNING Performed at [facility], [place] [address] [place], [place] [address]    Culture STAPHYLOCOCCUS AUREUS (A)    Report Status PENDING   POC SARS Coronavirus 2 Ag-ED - Nasal Swab (BD Veritor Kit)      Status: None   Collection Time: 06/02/19 11:36 PM  Result Value Ref Range   SARS Coronavirus 2 Ag NEGATIVE NEGATIVE    Comment: (NOTE) SARS-CoV-2 antigen NOT DETECTED.  Negative results are presumptive.  Negative results do not preclude SARS-CoV-2 infection and should not be used as the sole basis for treatment or other patient management decisions, including infection  control decisions, particularly in the presence of clinical signs and  symptoms consistent with COVID-19, or in those who have been in contact with the virus.  Negative results must be combined with clinical observations, patient history, and epidemiological information. The expected result is Negative. Fact Sheet for Patients: PodPark.tn Fact Sheet for Healthcare Providers: GiftContent.is This test is not yet approved or cleared by the [place] FDA and  has been authorized for detection and/or diagnosis of SARS-CoV-2 by FDA under an Emergency Use Authorization (EUA).  This EUA will remain in effect (meaning this test can be used) for the duration of  the COVID-19 de claration under Section 564(b)(1) of the Act, 21 U.S.C. section 360bbb-3(b)(1), unless the authorization is terminated or revoked sooner.   Blood culture (routine x 2)     Status: Abnormal (Preliminary result)   Collection Time: 06/03/19 12:05 AM   Specimen: BLOOD  Result Value Ref Range   Specimen Description      BLOOD RIGHT ANTECUBITAL Performed at [facility], [place] [address], [place], [place] [address]    Special Requests      BOTTLES DRAWN AEROBIC AND ANAEROBIC Blood Culture adequate volume Performed at [facility] [place] [address], [place], [place] [address]    Culture  Setup Time      GRAM POSITIVE COCCI IN CLUSTERS IN BOTH AEROBIC AND ANAEROBIC BOTTLES CRITICAL RESULT CALLED TO, READ BACK BY AND VERIFIED WITH: J LEGGE PHARMD 2200 06/03/19 A  BROWNING Performed at [facility], [place] [address] [place], [place] [address]    Culture STAPHYLOCOCCUS AUREUS (A)    Report Status PENDING   Blood Culture ID Panel (Reflexed)     Status: Abnormal   Collection Time: 06/03/19 12:05 AM  Result Value Ref Range   Enterococcus species NOT DETECTED NOT DETECTED   Listeria monocytogenes NOT DETECTED NOT DETECTED   Staphylococcus species DETECTED (A) NOT DETECTED  Comment: CRITICAL RESULT CALLED TO, READ BACK BY AND VERIFIED WITH: J LEGGE PHARMD 2200 06/03/19 A BROWNING    Staphylococcus aureus (BCID) DETECTED (A) NOT DETECTED    Comment: Methicillin (oxacillin)-resistant Staphylococcus aureus (MRSA). MRSA is predictably resistant to beta-lactam antibiotics (except ceftaroline). Preferred therapy is vancomycin unless clinically contraindicated. Patient requires contact precautions if  hospitalized. CRITICAL RESULT CALLED TO, READ BACK BY AND VERIFIED WITH: J LEGGE PHARMD 2200 06/03/19 A BROWNING    Methicillin resistance DETECTED (A) NOT DETECTED    Comment: CRITICAL RESULT CALLED TO, READ BACK BY AND VERIFIED WITH: J LEGGE PHARMD 2200 06/03/19 A BROWNING    Streptococcus species NOT DETECTED NOT DETECTED   Streptococcus agalactiae NOT DETECTED NOT DETECTED   Streptococcus pneumoniae NOT DETECTED NOT DETECTED   Streptococcus pyogenes NOT DETECTED NOT DETECTED   Acinetobacter baumannii NOT DETECTED NOT DETECTED   Enterobacteriaceae species NOT DETECTED NOT DETECTED   Enterobacter cloacae complex NOT DETECTED NOT DETECTED   Escherichia coli NOT DETECTED NOT DETECTED   Klebsiella oxytoca NOT DETECTED NOT DETECTED   Klebsiella pneumoniae NOT DETECTED NOT DETECTED   Proteus species NOT DETECTED NOT DETECTED   Serratia marcescens NOT DETECTED NOT DETECTED   Haemophilus influenzae NOT DETECTED NOT DETECTED   Neisseria meningitidis NOT DETECTED NOT DETECTED   Pseudomonas aeruginosa NOT DETECTED NOT DETECTED   Candida albicans NOT  DETECTED NOT DETECTED   Candida glabrata NOT DETECTED NOT DETECTED   Candida krusei NOT DETECTED NOT DETECTED   Candida parapsilosis NOT DETECTED NOT DETECTED   Candida tropicalis NOT DETECTED NOT DETECTED    Comment: Performed at [facility] [address], [place], [place] [address]  Lactic acid, plasma     Status: None   Collection Time: 06/03/19 12:35 AM  Result Value Ref Range   Lactic Acid, Venous 1.1 0.5 - 1.9 mmol/L    Comment: Performed at [facility], [place] [address], [place], [place] [address]  Urinalysis, Routine w reflex microscopic     Status: Abnormal   Collection Time: 06/03/19 12:35 AM  Result Value Ref Range   Color, Urine YELLOW YELLOW   APPearance CLEAR CLEAR   Specific Gravity, Urine 1.012 1.005 - 1.030   pH 6.0 5.0 - 8.0   Glucose, UA NEGATIVE NEGATIVE mg/dL   Hgb urine dipstick MODERATE (A) NEGATIVE   Bilirubin Urine NEGATIVE NEGATIVE   Ketones, ur NEGATIVE NEGATIVE mg/dL   Protein, ur NEGATIVE NEGATIVE mg/dL   Nitrite NEGATIVE NEGATIVE   Leukocytes,Ua NEGATIVE NEGATIVE   RBC / HPF 11-20 0 - 5 RBC/hpf   WBC, UA 0-5 0 - 5 WBC/hpf   Bacteria, UA RARE (A) NONE SEEN   Mucus PRESENT    Budding Yeast PRESENT     Comment: Performed at [facility] [place], [place] [address], [place], [place] [address]  SARS CORONAVIRUS 2 (TAT 6-24 HRS) Nasopharyngeal Nasopharyngeal Swab     Status: None   Collection Time: 06/03/19 12:35 AM   Specimen: Nasopharyngeal Swab  Result Value Ref Range   SARS Coronavirus 2 NEGATIVE NEGATIVE    Comment: (NOTE) SARS-CoV-2 target nucleic acids are NOT DETECTED. The SARS-CoV-2 RNA is generally detectable in upper and lower respiratory specimens during the acute phase of infection. Negative results do not preclude SARS-CoV-2 infection, do not rule out co-infections with other pathogens, and should not be used as the sole basis for treatment or other patient management decisions. Negative results must be  combined with clinical observations, patient history, and  epidemiological information. The expected result is Negative. Fact Sheet for Patients: SugarRoll.be Fact Sheet for Healthcare Providers: https://www.woods-mathews.com/ This test is not yet approved or cleared by the [place] FDA and  has been authorized for detection and/or diagnosis of SARS-CoV-2 by FDA under an Emergency Use Authorization (EUA). This EUA will remain  in effect (meaning this test can be used) for the duration of the COVID-19 declaration under Section 56 4(b)(1) of the Act, 21 U.S.C. section 360bbb-3(b)(1), unless the authorization is terminated or revoked sooner. Performed at [facility], [place] [address] [place], [place] [address]   Strep pneumoniae urinary antigen     Status: None   Collection Time: 06/03/19 12:37 AM  Result Value Ref Range   Strep Pneumo Urinary Antigen NEGATIVE NEGATIVE    Comment:        Infection due to S. pneumoniae cannot be absolutely ruled out since the antigen present may be below the detection limit of the test. Performed at [facility], [place] [address], [place], [place] [address]   C-reactive protein     Status: Abnormal   Collection Time: 06/03/19  1:13 AM  Result Value Ref Range   CRP 23.6 (H) <1.0 mg/dL    Comment: Performed at [facility], [place] [address] [place], [place] [address]  Ferritin     Status: None   Collection Time: 06/03/19  1:13 AM  Result Value Ref Range   Ferritin 87 24 - 336 ng/mL    Comment: Performed at [facility], [place] [address], [place], [place] [address]  CBC     Status: Abnormal   Collection Time: 06/03/19  2:12 AM  Result Value Ref Range   WBC 9.5 4.0 - 10.5 K/uL   RBC 4.18 (L) 4.22 - 5.81 MIL/uL   Hemoglobin 11.9 (L) 13.0 - 17.0 g/dL   HCT 37.6 (L) 39.0 - 52.0 %   MCV 90.0 80.0 - 100.0 fL   MCH 28.5 26.0 - 34.0 pg   MCHC 31.6 30.0 - 36.0  g/dL   RDW 14.3 11.5 - 15.5 %   Platelets 345 150 - 400 K/uL   nRBC 0.0 0.0 - 0.2 %    Comment: Performed at [facility], [place] [address], [place], [place] [address]  Creatinine, serum     Status: None   Collection Time: 06/03/19  2:12 AM  Result Value Ref Range   Creatinine, Ser 0.89 0.61 - 1.24 mg/dL   GFR calc non Af Amer >60 >60 mL/min   GFR calc Af Amer >60 >60 mL/min    Comment: Performed at [facility], [place] [address] [place],  [address]  Troponin I (High Sensitivity)     Status: None   Collection Time: 06/03/19  2:12 AM  Result Value Ref Range   Troponin I (High Sensitivity) 7 <18 ng/L    Comment: (NOTE) Elevated high sensitivity troponin I (hsTnI) values and significant  changes across serial measurements may suggest ACS but many other  chronic and acute conditions are known to elevate hsTnI results.  Refer to the "Links" section for chest pain algorithms and additional  guidance. Performed at [facility], [place] [address] [place], [place] [address]   Lactate dehydrogenase     Status: Abnormal   Collection Time: 06/03/19  2:12 AM  Result Value Ref Range   LDH 233 (H) 98 - 192 U/L    Comment: Performed at [facility], [place] [address], [place], [place] [address]  D-dimer, quantitative (not at [facility])     Status: Abnormal   Collection Time: 06/03/19  2:12 AM  Result Value Ref Range   D-Dimer, Quant 3.75 (H) 0.00 - 0.50 ug/mL-FEU    Comment: (NOTE) At the manufacturer cut-off of 0.50 ug/mL FEU, this assay has been documented to exclude PE with a sensitivity and negative predictive value of 97 to 99%.  At this time, this assay has not been approved by the FDA to exclude DVT/VTE. Results should be correlated with clinical presentation. Performed at [facility], [place] [address], [place], [place] [address]   Fibrinogen     Status: Abnormal   Collection Time: 06/03/19   2:12 AM  Result Value Ref Range   Fibrinogen >800 (H) 210 - 475 mg/dL    Comment: Performed at [facility], [place] [address] [place], [place] [address]  Procalcitonin - Baseline     Status: None   Collection Time: 06/03/19  2:12 AM  Result Value Ref Range   Procalcitonin 0.14 ng/mL    Comment:        Interpretation: PCT (Procalcitonin) <= 0.5 ng/mL: Systemic infection (sepsis) is not likely. Local bacterial infection is possible. (NOTE)       Sepsis PCT Algorithm           Lower Respiratory Tract                                      Infection PCT Algorithm    ----------------------------     ----------------------------         PCT < 0.25 ng/mL                PCT < 0.10 ng/mL         Strongly encourage             Strongly discourage   discontinuation of antibiotics    initiation of antibiotics    ----------------------------     -----------------------------       PCT 0.25 - 0.50 ng/mL            PCT 0.10 - 0.25 ng/mL               OR       >80% decrease in PCT            Discourage initiation of                                            antibiotics      Encourage discontinuation           of antibiotics    ----------------------------     -----------------------------         PCT >= 0.50 ng/mL              PCT 0.26 - 0.50 ng/mL               AND        <80% decrease in PCT             Encourage initiation of  antibiotics       Encourage continuation           of antibiotics    ----------------------------     -----------------------------        PCT >= 0.50 ng/mL                  PCT > 0.50 ng/mL               AND         increase in PCT                  Strongly encourage                                      initiation of antibiotics    Strongly encourage escalation           of antibiotics                                     -----------------------------                                           PCT <= 0.25  ng/mL                                                 OR                                        > 80% decrease in PCT                                     Discontinue / Do not initiate                                             antibiotics Performed at [place] [address], [place], [place] [address]   Expectorated sputum assessment w rflx to resp cult     Status: None   Collection Time: 06/03/19  4:41 AM   Specimen: Expectorated Sputum  Result Value Ref Range   Specimen Description EXPECTORATED SPUTUM    Special Requests NONE    Sputum evaluation      Sputum specimen not acceptable for testing.  Please recollect.   NOFIFIED [facility] AT 9518 ON 06/03/19 BY A,MOHAMED Performed at [facility], [place] [address], [place], [place] [address]    Report Status 06/03/2019 FINAL   Procalcitonin     Status: None   Collection Time: 06/03/19  6:13 AM  Result Value Ref Range   Procalcitonin 0.16 ng/mL    Comment:        Interpretation: PCT (Procalcitonin) <= 0.5 ng/mL: Systemic infection (sepsis) is not likely. Local bacterial infection is possible. (NOTE)       Sepsis  PCT Algorithm           Lower Respiratory Tract                                      Infection PCT Algorithm    ----------------------------     ----------------------------         PCT < 0.25 ng/mL                PCT < 0.10 ng/mL         Strongly encourage             Strongly discourage   discontinuation of antibiotics    initiation of antibiotics    ----------------------------     -----------------------------       PCT 0.25 - 0.50 ng/mL            PCT 0.10 - 0.25 ng/mL               OR       >80% decrease in PCT            Discourage initiation of                                            antibiotics      Encourage discontinuation           of antibiotics    ----------------------------     -----------------------------         PCT >= 0.50 ng/mL              PCT 0.26 -  0.50 ng/mL               AND        <80% decrease in PCT             Encourage initiation of                                             antibiotics       Encourage continuation           of antibiotics    ----------------------------     -----------------------------        PCT >= 0.50 ng/mL                  PCT > 0.50 ng/mL               AND         increase in PCT                  Strongly encourage                                      initiation of antibiotics    Strongly encourage escalation           of antibiotics                                     -----------------------------  PCT <= 0.25 ng/mL                                                 OR                                        > 80% decrease in PCT                                     Discontinue / Do not initiate                                             antibiotics Performed at [place] [address] [place], [place] [address]   CBC with Differential/Platelet     Status: Abnormal   Collection Time: 06/03/19  6:13 AM  Result Value Ref Range   WBC 10.3 4.0 - 10.5 K/uL   RBC 4.39 4.22 - 5.81 MIL/uL   Hemoglobin 12.7 (L) 13.0 - 17.0 g/dL   HCT 39.1 39.0 - 52.0 %   MCV 89.1 80.0 - 100.0 fL   MCH 28.9 26.0 - 34.0 pg   MCHC 32.5 30.0 - 36.0 g/dL   RDW 14.3 11.5 - 15.5 %   Platelets 353 150 - 400 K/uL   nRBC 0.0 0.0 - 0.2 %   Neutrophils Relative % 83 %   Neutro Abs 8.5 (H) 1.7 - 7.7 K/uL   Lymphocytes Relative 8 %   Lymphs Abs 0.9 0.7 - 4.0 K/uL   Monocytes Relative 8 %   Monocytes Absolute 0.8 0.1 - 1.0 K/uL   Eosinophils Relative 0 %   Eosinophils Absolute 0.0 0.0 - 0.5 K/uL   Basophils Relative 0 %   Basophils Absolute 0.0 0.0 - 0.1 K/uL   Immature Granulocytes 1 %   Abs Immature Granulocytes 0.05 0.00 - 0.07 K/uL    Comment: Performed at [facility], [place] [address], [place], [place] [address]  Basic metabolic panel      Status: Abnormal   Collection Time: 06/03/19  6:13 AM  Result Value Ref Range   Sodium 136 135 - 145 mmol/L   Potassium 3.5 3.5 - 5.1 mmol/L   Chloride 99 98 - 111 mmol/L   CO2 23 22 - 32 mmol/L   Glucose, Bld 147 (H) 70 - 99 mg/dL   BUN 8 6 - 20 mg/dL   Creatinine, Ser 0.76 0.61 - 1.24 mg/dL   Calcium 8.7 (L) 8.9 - 10.3 mg/dL   GFR calc non Af Amer >60 >60 mL/min   GFR calc Af Amer >60 >60 mL/min   Anion gap 14 5 - 15    Comment: Performed at [facility], [place] [address], [place], [place] [address]  Respiratory Panel by PCR     Status: None   Collection Time: 06/03/19 10:12 AM   Specimen: Nasopharyngeal Swab; Respiratory  Result Value Ref Range   Adenovirus NOT DETECTED NOT DETECTED   Coronavirus 229E NOT DETECTED NOT DETECTED    Comment: (NOTE) The Coronavirus on the  Respiratory Panel, DOES NOT test for the novel  Coronavirus (2019 nCoV)    Coronavirus HKU1 NOT DETECTED NOT DETECTED   Coronavirus NL63 NOT DETECTED NOT DETECTED   Coronavirus OC43 NOT DETECTED NOT DETECTED   Metapneumovirus NOT DETECTED NOT DETECTED   Rhinovirus / Enterovirus NOT DETECTED NOT DETECTED   Influenza A NOT DETECTED NOT DETECTED   Influenza B NOT DETECTED NOT DETECTED   Parainfluenza Virus 1 NOT DETECTED NOT DETECTED   Parainfluenza Virus 2 NOT DETECTED NOT DETECTED   Parainfluenza Virus 3 NOT DETECTED NOT DETECTED   Parainfluenza Virus 4 NOT DETECTED NOT DETECTED   Respiratory Syncytial Virus NOT DETECTED NOT DETECTED   Bordetella pertussis NOT DETECTED NOT DETECTED   Chlamydophila pneumoniae NOT DETECTED NOT DETECTED   Mycoplasma pneumoniae NOT DETECTED NOT DETECTED    Comment: Performed at [facility], [place] [address] [place], [place] [address]  Culture, respiratory     Status: None (Preliminary result)   Collection Time: 06/03/19  4:40 PM   Specimen: SPU  Result Value Ref Range   Specimen Description      SPUTUM Performed at [facility], [place] [address] [place], [place] [address]    Special Requests      NONE Performed at [facility], [place] [address], [place], [place] [address]    Gram Stain      RARE WBC PRESENT, PREDOMINANTLY PMN RARE GRAM POSITIVE COCCI IN PAIRS RARE BUDDING YEAST SEEN    Culture      CULTURE REINCUBATED FOR BETTER GROWTH Performed at [facility], [place] [address], [place], [place] [address]    Report Status PENDING   MRSA PCR Screening     Status: None   Collection Time: 06/03/19  8:11 PM   Specimen: Nasopharyngeal  Result Value Ref Range   MRSA by PCR NEGATIVE NEGATIVE    Comment:        The GeneXpert MRSA Assay (FDA approved for NASAL specimens only), is one component of a comprehensive MRSA colonization surveillance program. It is not intended to diagnose MRSA infection nor to guide or monitor treatment for MRSA infections. Performed at [facility], [place] [address], [place], [place] [address]   Procalcitonin     Status: None   Collection Time: 06/04/19  5:19 AM  Result Value Ref Range   Procalcitonin <0.10 ng/mL    Comment:        Interpretation: PCT (Procalcitonin) <= 0.5 ng/mL: Systemic infection (sepsis) is not likely. Local bacterial infection is possible. (NOTE)       Sepsis PCT Algorithm           Lower Respiratory Tract                                      Infection PCT Algorithm    ----------------------------     ----------------------------         PCT < 0.25 ng/mL                PCT < 0.10 ng/mL         Strongly encourage             Strongly discourage   discontinuation of antibiotics    initiation of antibiotics    ----------------------------     -----------------------------       PCT 0.25 - 0.50 ng/mL  PCT 0.10 - 0.25 ng/mL               OR       >80% decrease in PCT            Discourage initiation of                                            antibiotics      Encourage discontinuation           of  antibiotics    ----------------------------     -----------------------------         PCT >= 0.50 ng/mL              PCT 0.26 - 0.50 ng/mL               AND        <80% decrease in PCT             Encourage initiation of                                             antibiotics       Encourage continuation           of antibiotics    ----------------------------     -----------------------------        PCT >= 0.50 ng/mL                  PCT > 0.50 ng/mL               AND         increase in PCT                  Strongly encourage                                      initiation of antibiotics    Strongly encourage escalation           of antibiotics                                     -----------------------------                                           PCT <= 0.25 ng/mL                                                 OR                                        > 80% decrease in PCT  Discontinue / Do not initiate                                             antibiotics Performed at [place] [address], [place], [place] [address]   Creatinine, serum     Status: None   Collection Time: 06/04/19  5:19 AM  Result Value Ref Range   Creatinine, Ser 0.82 0.61 - 1.24 mg/dL   GFR calc non Af Amer >60 >60 mL/min   GFR calc Af Amer >60 >60 mL/min    Comment: Performed at [facility], [place] [address] [place], [place] [address]      Component Value Date/Time   SDES  06/03/2019 1640    SPUTUM Performed at [facility], [place] [address], [place], [place] [address]    SPECREQUEST  06/03/2019 1640    NONE Performed at [facility], [place] [address] [place], [place] [address]    CULT  06/03/2019 1640    CULTURE REINCUBATED FOR BETTER GROWTH Performed at [place] [address], [place], [place] [address]    REPTSTATUS PENDING 06/03/2019 1640   DG Chest Port 1  View  Result Date: 06/02/2019 CLINICAL DATA:  Fever, shortness of breath EXAM: PORTABLE CHEST 1 VIEW COMPARISON:  11/25/2018 FINDINGS: Heart is borderline in size. Mild vascular congestion. Bilateral perihilar opacities. Left basilar opacities. Findings could reflect edema or infection. No visible significant effusions or acute bony abnormality. IMPRESSION: Borderline heart size. Vascular congestion with bilateral perihilar and left lower lobe opacities, edema versus infection. Electronically Signed   By: Rolm Baptise M.D.   On: 06/02/2019 22:58   ECHOCARDIOGRAM COMPLETE  Result Date: 06/04/2019   ECHOCARDIOGRAM REPORT   Patient Name:   Howard Fox Pixley Date of Exam: 06/04/2019 Medical Rec #:  [id number]      Height:       72.0 in Accession #:    [id number]     Weight:       242.0 lb Date of Birth:  1960/09/08      BSA:          2.31 m Patient Age:    59 years       BP:           132/75 mmHg Patient Gender: M              HR:           95 bpm. Exam Location:  Inpatient Procedure: 2D Echo, Cardiac Doppler and Color Doppler Indications:    Bacteremia  History:        Patient has prior history of Echocardiogram examinations, most                 recent 10/10/2018. Signs/Symptoms:Bacteremia; Risk                 Factors:Hypertension, Dyslipidemia and Diabetes. Hep. C, IVDU,                 MRSA, T-spine discitis.  Sonographer:    Dustin Flock Referring Phys: [place]  1. Left ventricular ejection fraction, by visual estimation, is 55 to 60%. The left ventricle has normal function. There is no left ventricular hypertrophy.  2. Left ventricular diastolic parameters are indeterminate.  3. The left ventricle has no regional wall  motion abnormalities.  4. Global right ventricle has normal systolic function.The right ventricular size is normal. No increase in right ventricular wall thickness.  5. Left atrial size was normal.  6. Right atrial size was normal.  7. The mitral valve is normal in  structure. No evidence of mitral valve regurgitation. No evidence of mitral stenosis.  8. The tricuspid valve is normal in structure.  9. The aortic valve is normal in structure. Aortic valve regurgitation is not visualized. No evidence of aortic valve sclerosis or stenosis. 10. The pulmonic valve was normal in structure. Pulmonic valve regurgitation is not visualized. 11. The inferior vena cava is normal in size with greater than 50% respiratory variability, suggesting right atrial pressure of 3 mmHg. FINDINGS  Left Ventricle: Left ventricular ejection fraction, by visual estimation, is 55 to 60%. The left ventricle has normal function. The left ventricle has no regional wall motion abnormalities. There is no left ventricular hypertrophy. Left ventricular diastolic parameters are indeterminate. Indeterminate filling pressures. Right Ventricle: The right ventricular size is normal. No increase in right ventricular wall thickness. Global RV systolic function is has normal systolic function. Left Atrium: Left atrial size was normal in size. Right Atrium: Right atrial size was normal in size Pericardium: There is no evidence of pericardial effusion. Mitral Valve: The mitral valve is normal in structure. No evidence of mitral valve regurgitation. No evidence of mitral valve stenosis by observation. Tricuspid Valve: The tricuspid valve is normal in structure. Tricuspid valve regurgitation is not demonstrated. Aortic Valve: The aortic valve is normal in structure. Aortic valve regurgitation is not visualized. The aortic valve is structurally normal, with no evidence of sclerosis or stenosis. Pulmonic Valve: The pulmonic valve was normal in structure. Pulmonic valve regurgitation is not visualized. Pulmonic regurgitation is not visualized. Aorta: The aortic root, ascending aorta and aortic arch are all structurally normal, with no evidence of dilitation or obstruction. Venous: The inferior vena cava is normal in size with  greater than 50% respiratory variability, suggesting right atrial pressure of 3 mmHg. IAS/Shunts: No atrial level shunt detected by color flow Doppler. There is no evidence of a patent foramen ovale. No ventricular septal defect is seen or detected. There is no evidence of an atrial septal defect. Additional Comments: No vegetations are seen. Consider TEE if the suspicion for endocarditis is high.  LEFT VENTRICLE PLAX 2D LVIDd:         5.21 cm  Diastology LVIDs:         3.38 cm  LV e' lateral:   7.94 cm/s LV PW:         1.09 cm  LV E/e' lateral: 8.6 LV IVS:        1.13 cm  LV e' medial:    5.22 cm/s LVOT diam:     2.50 cm  LV E/e' medial:  13.1 LV SV:         83 ml LV SV Index:   34.81 LVOT Area:     4.91 cm  RIGHT VENTRICLE RV Basal diam:  2.69 cm RV S prime:     13.20 cm/s TAPSE (M-mode): 2.9 cm LEFT ATRIUM             Index       RIGHT ATRIUM           Index LA diam:        3.50 cm 1.51 cm/m  RA Area:     14.20 cm LA Vol (A2C):   48.1 ml  20.82 ml/m RA Volume:   40.60 ml  17.57 ml/m LA Vol (A4C):   51.2 ml 22.16 ml/m LA Biplane Vol: 51.8 ml 22.42 ml/m  AORTIC VALVE LVOT Vmax:   99.80 cm/s LVOT Vmean:  65.700 cm/s LVOT VTI:    0.206 m  AORTA Ao Root diam: 3.70 cm MITRAL VALVE MV Area (PHT): 5.66 cm             SHUNTS MV PHT:        38.86 msec           Systemic VTI:  0.21 m MV Decel Time: 134 msec             Systemic Diam: 2.50 cm MV E velocity: 68.40 cm/s 103 cm/s MV A velocity: 65.50 cm/s 70.3 cm/s MV E/A ratio:  1.04       1.5  Mihai Croitoru MD Electronically signed by Sanda Klein MD Signature Date/Time: 06/04/2019/6:09:26 PM    Final    Recent Results (from the past 240 hour(s))  Blood culture (routine x 2)     Status: Abnormal (Preliminary result)   Collection Time: 06/02/19 10:45 PM   Specimen: BLOOD  Result Value Ref Range Status   Specimen Description   Final    BLOOD LEFT ANTECUBITAL Performed at [facility], [place] [address] [place], [place] [address]    Special  Requests   Final    BOTTLES DRAWN AEROBIC AND ANAEROBIC Blood Culture adequate volume   Culture  Setup Time   Final    GRAM POSITIVE COCCI IN CLUSTERS IN BOTH AEROBIC AND ANAEROBIC BOTTLES CRITICAL RESULT CALLED TO, READ BACK BY AND VERIFIED WITH: Shelda Jakes PHARMD 1640 06/03/19 A BROWNING Performed at [facility], [place] [address], [place], [place] [address]    Culture STAPHYLOCOCCUS AUREUS (A)  Final   Report Status PENDING  Incomplete  Blood culture (routine x 2)     Status: Abnormal (Preliminary result)   Collection Time: 06/03/19 12:05 AM   Specimen: BLOOD  Result Value Ref Range Status   Specimen Description   Final    BLOOD RIGHT ANTECUBITAL Performed at [facility], [place] [address], [place], [place] [address]    Special Requests   Final    BOTTLES DRAWN AEROBIC AND ANAEROBIC Blood Culture adequate volume Performed at [place] [address] [place], [place] [address]    Culture  Setup Time   Final    GRAM POSITIVE COCCI IN CLUSTERS IN BOTH AEROBIC AND ANAEROBIC BOTTLES CRITICAL RESULT CALLED TO, READ BACK BY AND VERIFIED WITH: J LEGGE PHARMD 2200 06/03/19 A BROWNING Performed at [facility], [place] [address], [place],  [address]    Culture STAPHYLOCOCCUS AUREUS (A)  Final   Report Status PENDING  Incomplete  Blood Culture ID Panel (Reflexed)     Status: Abnormal   Collection Time: 06/03/19 12:05 AM  Result Value Ref Range Status   Enterococcus species NOT DETECTED NOT DETECTED Final   Listeria monocytogenes NOT DETECTED NOT DETECTED Final   Staphylococcus species DETECTED (A) NOT DETECTED Final    Comment: CRITICAL RESULT CALLED TO, READ BACK BY AND VERIFIED WITH: J LEGGE PHARMD 2200 06/03/19 A BROWNING    Staphylococcus aureus (BCID) DETECTED (A) NOT DETECTED Final    Comment: Methicillin (oxacillin)-resistant Staphylococcus aureus (MRSA). MRSA is predictably resistant to beta-lactam antibiotics (except ceftaroline).  Preferred therapy is vancomycin unless clinically contraindicated. Patient requires contact precautions if  hospitalized.  CRITICAL RESULT CALLED TO, READ BACK BY AND VERIFIED WITH: J LEGGE PHARMD 2200 06/03/19 A BROWNING    Methicillin resistance DETECTED (A) NOT DETECTED Final    Comment: CRITICAL RESULT CALLED TO, READ BACK BY AND VERIFIED WITH: J LEGGE PHARMD 2200 06/03/19 A BROWNING    Streptococcus species NOT DETECTED NOT DETECTED Final   Streptococcus agalactiae NOT DETECTED NOT DETECTED Final   Streptococcus pneumoniae NOT DETECTED NOT DETECTED Final   Streptococcus pyogenes NOT DETECTED NOT DETECTED Final   Acinetobacter baumannii NOT DETECTED NOT DETECTED Final   Enterobacteriaceae species NOT DETECTED NOT DETECTED Final   Enterobacter cloacae complex NOT DETECTED NOT DETECTED Final   Escherichia coli NOT DETECTED NOT DETECTED Final   Klebsiella oxytoca NOT DETECTED NOT DETECTED Final   Klebsiella pneumoniae NOT DETECTED NOT DETECTED Final   Proteus species NOT DETECTED NOT DETECTED Final   Serratia marcescens NOT DETECTED NOT DETECTED Final   Haemophilus influenzae NOT DETECTED NOT DETECTED Final   Neisseria meningitidis NOT DETECTED NOT DETECTED Final   Pseudomonas aeruginosa NOT DETECTED NOT DETECTED Final   Candida albicans NOT DETECTED NOT DETECTED Final   Candida glabrata NOT DETECTED NOT DETECTED Final   Candida krusei NOT DETECTED NOT DETECTED Final   Candida parapsilosis NOT DETECTED NOT DETECTED Final   Candida tropicalis NOT DETECTED NOT DETECTED Final    Comment: Performed at [facility], [place] [address] [place], [place] [address]  SARS CORONAVIRUS 2 (TAT 6-24 HRS) Nasopharyngeal Nasopharyngeal Swab     Status: None   Collection Time: 06/03/19 12:35 AM   Specimen: Nasopharyngeal Swab  Result Value Ref Range Status   SARS Coronavirus 2 NEGATIVE NEGATIVE Final    Comment: (NOTE) SARS-CoV-2 target nucleic acids are NOT DETECTED. The SARS-CoV-2 RNA is  generally detectable in upper and lower respiratory specimens during the acute phase of infection. Negative results do not preclude SARS-CoV-2 infection, do not rule out co-infections with other pathogens, and should not be used as the sole basis for treatment or other patient management decisions. Negative results must be combined with clinical observations, patient history, and epidemiological information. The expected result is Negative. Fact Sheet for Patients: SugarRoll.be Fact Sheet for Healthcare Providers: https://www.woods-mathews.com/ This test is not yet approved or cleared by the [place] FDA and  has been authorized for detection and/or diagnosis of SARS-CoV-2 by FDA under an Emergency Use Authorization (EUA). This EUA will remain  in effect (meaning this test can be used) for the duration of the COVID-19 declaration under Section 56 4(b)(1) of the Act, 21 U.S.C. section 360bbb-3(b)(1), unless the authorization is terminated or revoked sooner. Performed at [facility], [place] [address] [place], [place] [address]   Expectorated sputum assessment w rflx to resp cult     Status: None   Collection Time: 06/03/19  4:41 AM   Specimen: Expectorated Sputum  Result Value Ref Range Status   Specimen Description EXPECTORATED SPUTUM  Final   Special Requests NONE  Final   Sputum evaluation   Final    Sputum specimen not acceptable for testing.  Please recollect.   NOFIFIED [facility] ER AT 1448 ON 06/03/19 BY A,MOHAMED Performed at [facility] [place] [address], [place], [place] [address]    Report Status 06/03/2019 FINAL  Final  Respiratory Panel by PCR     Status: None   Collection Time: 06/03/19 10:12 AM   Specimen: Nasopharyngeal Swab; Respiratory  Result Value Ref Range Status   Adenovirus NOT  DETECTED NOT DETECTED Final   Coronavirus 229E NOT DETECTED NOT DETECTED Final    Comment: (NOTE) The Coronavirus on  the Respiratory Panel, DOES NOT test for the novel  Coronavirus (2019 nCoV)    Coronavirus HKU1 NOT DETECTED NOT DETECTED Final   Coronavirus NL63 NOT DETECTED NOT DETECTED Final   Coronavirus OC43 NOT DETECTED NOT DETECTED Final   Metapneumovirus NOT DETECTED NOT DETECTED Final   Rhinovirus / Enterovirus NOT DETECTED NOT DETECTED Final   Influenza A NOT DETECTED NOT DETECTED Final   Influenza B NOT DETECTED NOT DETECTED Final   Parainfluenza Virus 1 NOT DETECTED NOT DETECTED Final   Parainfluenza Virus 2 NOT DETECTED NOT DETECTED Final   Parainfluenza Virus 3 NOT DETECTED NOT DETECTED Final   Parainfluenza Virus 4 NOT DETECTED NOT DETECTED Final   Respiratory Syncytial Virus NOT DETECTED NOT DETECTED Final   Bordetella pertussis NOT DETECTED NOT DETECTED Final   Chlamydophila pneumoniae NOT DETECTED NOT DETECTED Final   Mycoplasma pneumoniae NOT DETECTED NOT DETECTED Final    Comment: Performed at [facility], [place] [address] [place], [place] [address]  Culture, respiratory     Status: None (Preliminary result)   Collection Time: 06/03/19  4:40 PM   Specimen: SPU  Result Value Ref Range Status   Specimen Description   Final    SPUTUM Performed at [place] [address] [place], [place] [address]    Special Requests   Final    NONE Performed at [facility], [place] [address], [place], [place] [address]    Gram Stain   Final    RARE WBC PRESENT, PREDOMINANTLY PMN RARE GRAM POSITIVE COCCI IN PAIRS RARE BUDDING YEAST SEEN    Culture   Final    CULTURE REINCUBATED FOR BETTER GROWTH Performed at [facility], [place] [address], [place], [place] [address]    Report Status PENDING  Incomplete  MRSA PCR Screening     Status: None   Collection Time: 06/03/19  8:11 PM   Specimen: Nasopharyngeal  Result Value Ref Range Status   MRSA by PCR NEGATIVE NEGATIVE Final    Comment:        The GeneXpert MRSA Assay (FDA approved for  NASAL specimens only), is one component of a comprehensive MRSA colonization surveillance program. It is not intended to diagnose MRSA infection nor to guide or monitor treatment for MRSA infections. Performed at [facility], [place] [address] [place], [place] [address]     Microbiology: Recent Results (from the past 240 hour(s))  Blood culture (routine x 2)     Status: Abnormal (Preliminary result)   Collection Time: 06/02/19 10:45 PM   Specimen: BLOOD  Result Value Ref Range Status   Specimen Description   Final    BLOOD LEFT ANTECUBITAL Performed at [place] [address], [place], [place] [address]    Special Requests   Final    BOTTLES DRAWN AEROBIC AND ANAEROBIC Blood Culture adequate volume   Culture  Setup Time   Final    GRAM POSITIVE COCCI IN CLUSTERS IN BOTH AEROBIC AND ANAEROBIC BOTTLES CRITICAL RESULT CALLED TO, READ BACK BY AND VERIFIED WITH: Shelda Jakes PHARMD 1640 06/03/19 A BROWNING Performed at [facility], [place] [address], [place], [place] [address]    Culture STAPHYLOCOCCUS AUREUS (A)  Final   Report Status PENDING  Incomplete  Blood culture (routine x 2)     Status:  Abnormal (Preliminary result)   Collection Time: 06/03/19 12:05 AM   Specimen: BLOOD  Result Value Ref Range Status   Specimen Description   Final    BLOOD RIGHT ANTECUBITAL Performed at [facility], [place] [address] [place], [place] [address]    Special Requests   Final    BOTTLES DRAWN AEROBIC AND ANAEROBIC Blood Culture adequate volume Performed at [place] [address], [place], [place] [address]    Culture  Setup Time   Final    GRAM POSITIVE COCCI IN CLUSTERS IN BOTH AEROBIC AND ANAEROBIC BOTTLES CRITICAL RESULT CALLED TO, READ BACK BY AND VERIFIED WITH: J LEGGE PHARMD 2200 06/03/19 A BROWNING Performed at [facility], [place] [address] [place], [place] [address]    Culture STAPHYLOCOCCUS AUREUS (A)   Final   Report Status PENDING  Incomplete  Blood Culture ID Panel (Reflexed)     Status: Abnormal   Collection Time: 06/03/19 12:05 AM  Result Value Ref Range Status   Enterococcus species NOT DETECTED NOT DETECTED Final   Listeria monocytogenes NOT DETECTED NOT DETECTED Final   Staphylococcus species DETECTED (A) NOT DETECTED Final    Comment: CRITICAL RESULT CALLED TO, READ BACK BY AND VERIFIED WITH: J LEGGE PHARMD 2200 06/03/19 A BROWNING    Staphylococcus aureus (BCID) DETECTED (A) NOT DETECTED Final    Comment: Methicillin (oxacillin)-resistant Staphylococcus aureus (MRSA). MRSA is predictably resistant to beta-lactam antibiotics (except ceftaroline). Preferred therapy is vancomycin unless clinically contraindicated. Patient requires contact precautions if  hospitalized. CRITICAL RESULT CALLED TO, READ BACK BY AND VERIFIED WITH: J LEGGE PHARMD 2200 06/03/19 A BROWNING    Methicillin resistance DETECTED (A) NOT DETECTED Final    Comment: CRITICAL RESULT CALLED TO, READ BACK BY AND VERIFIED WITH: J LEGGE PHARMD 2200 06/03/19 A BROWNING    Streptococcus species NOT DETECTED NOT DETECTED Final   Streptococcus agalactiae NOT DETECTED NOT DETECTED Final   Streptococcus pneumoniae NOT DETECTED NOT DETECTED Final   Streptococcus pyogenes NOT DETECTED NOT DETECTED Final   Acinetobacter baumannii NOT DETECTED NOT DETECTED Final   Enterobacteriaceae species NOT DETECTED NOT DETECTED Final   Enterobacter cloacae complex NOT DETECTED NOT DETECTED Final   Escherichia coli NOT DETECTED NOT DETECTED Final   Klebsiella oxytoca NOT DETECTED NOT DETECTED Final   Klebsiella pneumoniae NOT DETECTED NOT DETECTED Final   Proteus species NOT DETECTED NOT DETECTED Final   Serratia marcescens NOT DETECTED NOT DETECTED Final   Haemophilus influenzae NOT DETECTED NOT DETECTED Final   Neisseria meningitidis NOT DETECTED NOT DETECTED Final   Pseudomonas aeruginosa NOT DETECTED NOT DETECTED Final   Candida  albicans NOT DETECTED NOT DETECTED Final   Candida glabrata NOT DETECTED NOT DETECTED Final   Candida krusei NOT DETECTED NOT DETECTED Final   Candida parapsilosis NOT DETECTED NOT DETECTED Final   Candida tropicalis NOT DETECTED NOT DETECTED Final    Comment: Performed at [facility], [place]. [address], [place], [place] [address]  SARS CORONAVIRUS 2 (TAT 6-24 HRS) Nasopharyngeal Nasopharyngeal Swab     Status: None   Collection Time: 06/03/19 12:35 AM   Specimen: Nasopharyngeal Swab  Result Value Ref Range Status   SARS Coronavirus 2 NEGATIVE NEGATIVE Final    Comment: (NOTE) SARS-CoV-2 target nucleic acids are NOT DETECTED. The SARS-CoV-2 RNA is generally detectable in upper and lower respiratory specimens during the acute phase of infection. Negative results do not preclude SARS-CoV-2 infection, do not rule out co-infections with other  pathogens, and should not be used as the sole basis for treatment or other patient management decisions. Negative results must be combined with clinical observations, patient history, and epidemiological information. The expected result is Negative. Fact Sheet for Patients: SugarRoll.be Fact Sheet for Healthcare Providers: https://www.woods-mathews.com/ This test is not yet approved or cleared by the [place] FDA and  has been authorized for detection and/or diagnosis of SARS-CoV-2 by FDA under an Emergency Use Authorization (EUA). This EUA will remain  in effect (meaning this test can be used) for the duration of the COVID-19 declaration under Section 56 4(b)(1) of the Act, 21 U.S.C. section 360bbb-3(b)(1), unless the authorization is terminated or revoked sooner. Performed at [facility], [place] [address], [place], [place] [address]   Expectorated sputum assessment w rflx to resp cult     Status: None   Collection Time: 06/03/19  4:41 AM   Specimen: Expectorated Sputum  Result Value Ref  Range Status   Specimen Description EXPECTORATED SPUTUM  Final   Special Requests NONE  Final   Sputum evaluation   Final    Sputum specimen not acceptable for testing.  Please recollect.   NOFIFIED [facility] ER AT 3559 ON 06/03/19 BY A,MOHAMED Performed at [facility], [place] [address] [place], [place] [address]    Report Status 06/03/2019 FINAL  Final  Respiratory Panel by PCR     Status: None   Collection Time: 06/03/19 10:12 AM   Specimen: Nasopharyngeal Swab; Respiratory  Result Value Ref Range Status   Adenovirus NOT DETECTED NOT DETECTED Final   Coronavirus 229E NOT DETECTED NOT DETECTED Final    Comment: (NOTE) The Coronavirus on the Respiratory Panel, DOES NOT test for the novel  Coronavirus (2019 nCoV)    Coronavirus HKU1 NOT DETECTED NOT DETECTED Final   Coronavirus NL63 NOT DETECTED NOT DETECTED Final   Coronavirus OC43 NOT DETECTED NOT DETECTED Final   Metapneumovirus NOT DETECTED NOT DETECTED Final   Rhinovirus / Enterovirus NOT DETECTED NOT DETECTED Final   Influenza A NOT DETECTED NOT DETECTED Final   Influenza B NOT DETECTED NOT DETECTED Final   Parainfluenza Virus 1 NOT DETECTED NOT DETECTED Final   Parainfluenza Virus 2 NOT DETECTED NOT DETECTED Final   Parainfluenza Virus 3 NOT DETECTED NOT DETECTED Final   Parainfluenza Virus 4 NOT DETECTED NOT DETECTED Final   Respiratory Syncytial Virus NOT DETECTED NOT DETECTED Final   Bordetella pertussis NOT DETECTED NOT DETECTED Final   Chlamydophila pneumoniae NOT DETECTED NOT DETECTED Final   Mycoplasma pneumoniae NOT DETECTED NOT DETECTED Final    Comment: Performed at [facility], [place]. [address], [place], [place] [address]  Culture, respiratory     Status: None (Preliminary result)   Collection Time: 06/03/19  4:40 PM   Specimen: SPU  Result Value Ref Range Status   Specimen Description   Final    SPUTUM Performed at [place] [address], [place], [place]  [address]    Special Requests   Final    NONE Performed at [facility], [place] [address] [place], [place] [address]    Gram Stain   Final    RARE WBC PRESENT, PREDOMINANTLY PMN RARE GRAM POSITIVE COCCI IN PAIRS RARE BUDDING YEAST SEEN    Culture   Final    CULTURE REINCUBATED FOR BETTER GROWTH Performed at [facility], [place] [address], [place], [place] [address]    Report Status PENDING  Incomplete  MRSA PCR Screening     Status: None   Collection Time: 06/03/19  8:11 PM   Specimen: Nasopharyngeal  Result Value Ref Range Status   MRSA by PCR NEGATIVE NEGATIVE Final    Comment:        The GeneXpert MRSA Assay (FDA approved for NASAL specimens only), is one component of a comprehensive MRSA colonization surveillance program. It is not intended to diagnose MRSA infection nor to guide or monitor treatment for MRSA infections. Performed at [facility], [place] [address] [place], [place] [address]     Radiographs and labs were personally reviewed by me.   Bobby Rumpf, MD [facility] [phone] 06/04/2019, 6:20 PM

## 2019-06-04 NOTE — Progress Notes (Signed)
Pt continues to refuse O2 via nasal cannula. RN educated pt on importance of maintaining adequate oxygenation. O2 sats remain 87-89% on RA at rest.

## 2019-06-05 ENCOUNTER — Inpatient Hospital Stay (HOSPITAL_COMMUNITY): Payer: Medicare Other

## 2019-06-05 DIAGNOSIS — F3112 Bipolar disorder, current episode manic without psychotic features, moderate: Secondary | ICD-10-CM

## 2019-06-05 LAB — COMPREHENSIVE METABOLIC PANEL
ALT: 24 U/L (ref 0–44)
AST: 27 U/L (ref 15–41)
Albumin: 2.8 g/dL — ABNORMAL LOW (ref 3.5–5.0)
Alkaline Phosphatase: 103 U/L (ref 38–126)
Anion gap: 12 (ref 5–15)
BUN: 15 mg/dL (ref 6–20)
CO2: 23 mmol/L (ref 22–32)
Calcium: 8.9 mg/dL (ref 8.9–10.3)
Chloride: 100 mmol/L (ref 98–111)
Creatinine, Ser: 0.86 mg/dL (ref 0.61–1.24)
GFR calc Af Amer: >60 mL/min (ref >60)
GFR calc non Af Amer: >60 mL/min (ref >60)
Glucose, Bld: 126 mg/dL — ABNORMAL HIGH (ref 70–99)
Potassium: 4.4 mmol/L (ref 3.5–5.1)
Sodium: 135 mmol/L (ref 135–145)
Total Bilirubin: 0.4 mg/dL (ref 0.3–1.2)
Total Protein: 7.3 g/dL (ref 6.5–8.1)

## 2019-06-05 LAB — CULTURE, BLOOD (ROUTINE X 2)
Special Requests: ADEQUATE
Special Requests: ADEQUATE

## 2019-06-05 LAB — CBC
HCT: 38.2 % — ABNORMAL LOW (ref 39.0–52.0)
Hemoglobin: 12.2 g/dL — ABNORMAL LOW (ref 13.0–17.0)
MCH: 28.8 pg (ref 26.0–34.0)
MCHC: 31.9 g/dL (ref 30.0–36.0)
MCV: 90.3 fL (ref 80.0–100.0)
Platelets: 387 10*3/uL (ref 150–400)
RBC: 4.23 MIL/uL (ref 4.22–5.81)
RDW: 14.6 % (ref 11.5–15.5)
WBC: 14.3 10*3/uL — ABNORMAL HIGH (ref 4.0–10.5)
nRBC: 0 % (ref 0.0–0.2)

## 2019-06-05 LAB — LEGIONELLA PNEUMOPHILA SEROGP 1 UR AG: L. pneumophila Serogp 1 Ur Ag: NEGATIVE

## 2019-06-05 MED ORDER — IPRATROPIUM-ALBUTEROL 20-100 MCG/ACT IN AERS
2.0000 | INHALATION_SPRAY | Freq: Four times a day (QID) | RESPIRATORY_TRACT | Status: DC
  Administered 2019-06-05: 2 via RESPIRATORY_TRACT

## 2019-06-05 MED ORDER — IPRATROPIUM-ALBUTEROL 20-100 MCG/ACT IN AERS: 2.0000 | INHALATION_SPRAY | Freq: Four times a day (QID) | RESPIRATORY_TRACT | Status: DC | PRN

## 2019-06-05 MED ORDER — IPRATROPIUM-ALBUTEROL 0.5-2.5 (3) MG/3ML IN SOLN
3.0000 mL | Freq: Four times a day (QID) | RESPIRATORY_TRACT | Status: DC
  Administered 2019-06-05 – 2019-06-08 (×13): 3 mL via RESPIRATORY_TRACT
  Filled 2019-06-05 (×13): qty 3

## 2019-06-05 MED ORDER — SODIUM CHLORIDE 0.9 % IV SOLN: INTRAVENOUS | Status: DC

## 2019-06-05 NOTE — Progress Notes (Signed)
    [facility] has been requested to perform a transesophageal echocardiogram on 06/06/2019 for bacteremia.  After careful review of history and examination, the risks and benefits of transesophageal echocardiogram have been explained including risks of esophageal damage, perforation (1:10,000 risk), bleeding, pharyngeal hematoma as well as other potential complications associated with conscious sedation including aspiration, arrhythmia, respiratory failure and death. Alternatives to treatment were discussed, questions were answered. Patient is willing to proceed.   TEE scheduled for 06/06/2019 at 12:30pm with Dr. Audie Box.  Roby Lofts, PA-C 06/05/2019 4:39 PM

## 2019-06-05 NOTE — Progress Notes (Signed)
MRI T and L spines WITHOUT contrast completed. Pt became too uncomfortable and heated to complete exam - Pt requesting to return if gad is still wanted after w/o exam are dictated.  Lynelle Doctor RT,R,MR

## 2019-06-05 NOTE — Progress Notes (Signed)
RT in to see pt. for scheduled inhaler found on room air sats 77%, stressed importance to keep Oxygen on, placed to 6 lpm n/c to get saturations 90% per order, RN notified.

## 2019-06-05 NOTE — Progress Notes (Signed)
INFECTIOUS DISEASE PROGRESS NOTE  ID: Howard Fox is a 59 y.o. male with  Principal Problem:   Community acquired pneumonia Active Problems:   Bipolar disorder ([place])   Essential hypertension   Hyperlipidemia, mixed   Vitamin D deficiency   Vertebral osteomyelitis (HCC)   Fever   Dyspnea   Chronic pain syndrome   Pneumonia  Subjective: Complains of headache, cough. Sputum production (grey)  Abtx:  Anti-infectives (From admission, onward)   Start     Dose/Rate Route Frequency Ordered Stop   06/04/19 1000  vancomycin (VANCOREADY) IVPB 1250 mg/250 mL     1,250 mg 166.7 mL/hr over 90 Minutes Intravenous Every 12 hours 06/03/19 2214     06/03/19 2300  vancomycin (VANCOREADY) IVPB 2000 mg/400 mL     2,000 mg 200 mL/hr over 120 Minutes Intravenous  Once 06/03/19 2214 06/04/19 0941   06/03/19 2200  cefTRIAXone (ROCEPHIN) 2 g in sodium chloride 0.9 % 100 mL IVPB  Status:  Discontinued    Note to Pharmacy: Please time according to dose given in ED   2 g 200 mL/hr over 30 Minutes Intravenous Every 24 hours 06/03/19 0037 06/04/19 1650   06/03/19 2200  azithromycin (ZITHROMAX) 500 mg in sodium chloride 0.9 % 250 mL IVPB  Status:  Discontinued    Note to Pharmacy: Please time according to dose given in ED   500 mg 250 mL/hr over 60 Minutes Intravenous Every 24 hours 06/03/19 0037 06/04/19 1650   06/03/19 0000  cefTRIAXone (ROCEPHIN) 2 g in sodium chloride 0.9 % 100 mL IVPB     2 g 200 mL/hr over 30 Minutes Intravenous  Once 06/02/19 2359 06/03/19 0110   06/03/19 0000  azithromycin (ZITHROMAX) 500 mg in sodium chloride 0.9 % 250 mL IVPB     500 mg 250 mL/hr over 60 Minutes Intravenous  Once 06/02/19 2359 06/03/19 0233      Medications:  Scheduled: . amitriptyline  150 mg Oral QHS  . aspirin EC  325 mg Oral Daily  . citalopram  20 mg Oral Q lunch  . enoxaparin (LOVENOX) injection  40 mg Subcutaneous Q24H  . gabapentin  300 mg Oral TID  . ibuprofen  800 mg Oral QPC lunch  .  ipratropium-albuterol  3 mL Nebulization QID  . polyethylene glycol  17 g Oral Daily  . rosuvastatin  40 mg Oral Daily  . cholecalciferol  5,000 Units Oral Daily    Objective: Vital signs in last 24 hours: Temp:  [99.1 F (37.3 C)-100.5 F (38.1 C)] 100.5 F (38.1 C) (01/13 0648) Pulse Rate:  [91-102] 91 (01/13 0648) Resp:  [20] 20 (01/13 0648) BP: (127-132)/(63-75) 128/71 (01/13 0648) SpO2:  [87 %-93 %] 90 % (01/13 0822)   General appearance: alert, cooperative and no distress Resp: rhonchi anterior - bilateral Cardio: regular rate and rhythm GI: normal findings: bowel sounds normal and soft, non-tender  Lab Results Recent Labs    06/03/19 0613 06/04/19 0519 06/05/19 0616  WBC 10.3  --  14.3*  HGB 12.7*  --  12.2*  HCT 39.1  --  38.2*  NA 136  --  135  K 3.5  --  4.4  CL 99  --  100  CO2 23  --  23  BUN 8  --  15  CREATININE 0.76 0.82 0.86   Liver Panel Recent Labs    06/02/19 2235 06/05/19 0616  PROT 8.0 7.3  ALBUMIN 3.1* 2.8*  AST 28 27  ALT  21 24  ALKPHOS 99 103  BILITOT 0.3 0.4   Sedimentation Rate No results for input(s): ESRSEDRATE in the last 72 hours. C-Reactive Protein Recent Labs    06/03/19 0113  CRP 23.6*    Microbiology: Recent Results (from the past 240 hour(s))  Blood culture (routine x 2)     Status: Abnormal   Collection Time: 06/02/19 10:45 PM   Specimen: BLOOD  Result Value Ref Range Status   Specimen Description   Final    BLOOD LEFT ANTECUBITAL Performed at [facility] [address] [place], [place] [address]    Special Requests   Final    BOTTLES DRAWN AEROBIC AND ANAEROBIC Blood Culture adequate volume   Culture  Setup Time   Final    GRAM POSITIVE COCCI IN CLUSTERS IN BOTH AEROBIC AND ANAEROBIC BOTTLES CRITICAL RESULT CALLED TO, READ BACK BY AND VERIFIED WITH: Shelda Jakes PHARMD 1640 06/03/19 A BROWNING    Culture (A)  Final    STAPHYLOCOCCUS AUREUS SUSCEPTIBILITIES PERFORMED ON PREVIOUS CULTURE  WITHIN THE LAST 5 DAYS. Performed at [facility], [place] [address], [place], [place] [address]    Report Status 06/05/2019 FINAL  Final  Blood culture (routine x 2)     Status: Abnormal   Collection Time: 06/03/19 12:05 AM   Specimen: BLOOD  Result Value Ref Range Status   Specimen Description   Final    BLOOD RIGHT ANTECUBITAL Performed at [facility], [place] [address] [place], [place] [address]    Special Requests   Final    BOTTLES DRAWN AEROBIC AND ANAEROBIC Blood Culture adequate volume Performed at [facility] [address] [place], [place] [address]    Culture  Setup Time   Final    GRAM POSITIVE COCCI IN CLUSTERS IN BOTH AEROBIC AND ANAEROBIC BOTTLES CRITICAL RESULT CALLED TO, READ BACK BY AND VERIFIED WITH: J LEGGE PHARMD 2200 06/03/19 A BROWNING Performed at [facility], [place] [address], [place], [place] [address]    Culture METHICILLIN RESISTANT STAPHYLOCOCCUS AUREUS (A)  Final   Report Status 06/05/2019 FINAL  Final   Organism ID, Bacteria METHICILLIN RESISTANT STAPHYLOCOCCUS AUREUS  Final      Susceptibility   Methicillin resistant staphylococcus aureus - MIC*    CIPROFLOXACIN >=8 RESISTANT Resistant     ERYTHROMYCIN >=8 RESISTANT Resistant     GENTAMICIN <=0.5 SENSITIVE Sensitive     OXACILLIN >=4 RESISTANT Resistant     TETRACYCLINE <=1 SENSITIVE Sensitive     VANCOMYCIN 1 SENSITIVE Sensitive     TRIMETH/SULFA <=10 SENSITIVE Sensitive     CLINDAMYCIN <=0.25 SENSITIVE Sensitive     RIFAMPIN <=0.5 SENSITIVE Sensitive     Inducible Clindamycin NEGATIVE Sensitive     * METHICILLIN RESISTANT STAPHYLOCOCCUS AUREUS  Blood Culture ID Panel (Reflexed)     Status: Abnormal   Collection Time: 06/03/19 12:05 AM  Result Value Ref Range Status   Enterococcus species NOT DETECTED NOT DETECTED Final   Listeria monocytogenes NOT DETECTED NOT DETECTED Final   Staphylococcus species DETECTED (A) NOT DETECTED Final    Comment: CRITICAL  RESULT CALLED TO, READ BACK BY AND VERIFIED WITH: J LEGGE PHARMD 2200 06/03/19 A BROWNING    Staphylococcus aureus (BCID) DETECTED (A) NOT DETECTED Final    Comment: Methicillin (oxacillin)-resistant Staphylococcus aureus (MRSA). MRSA is predictably resistant to beta-lactam antibiotics (except ceftaroline). Preferred therapy is vancomycin unless clinically contraindicated. Patient requires contact precautions if  hospitalized. CRITICAL RESULT  CALLED TO, READ BACK BY AND VERIFIED WITH: J LEGGE PHARMD 2200 06/03/19 A BROWNING    Methicillin resistance DETECTED (A) NOT DETECTED Final    Comment: CRITICAL RESULT CALLED TO, READ BACK BY AND VERIFIED WITH: J LEGGE PHARMD 2200 06/03/19 A BROWNING    Streptococcus species NOT DETECTED NOT DETECTED Final   Streptococcus agalactiae NOT DETECTED NOT DETECTED Final   Streptococcus pneumoniae NOT DETECTED NOT DETECTED Final   Streptococcus pyogenes NOT DETECTED NOT DETECTED Final   Acinetobacter baumannii NOT DETECTED NOT DETECTED Final   Enterobacteriaceae species NOT DETECTED NOT DETECTED Final   Enterobacter cloacae complex NOT DETECTED NOT DETECTED Final   Escherichia coli NOT DETECTED NOT DETECTED Final   Klebsiella oxytoca NOT DETECTED NOT DETECTED Final   Klebsiella pneumoniae NOT DETECTED NOT DETECTED Final   Proteus species NOT DETECTED NOT DETECTED Final   Serratia marcescens NOT DETECTED NOT DETECTED Final   Haemophilus influenzae NOT DETECTED NOT DETECTED Final   Neisseria meningitidis NOT DETECTED NOT DETECTED Final   Pseudomonas aeruginosa NOT DETECTED NOT DETECTED Final   Candida albicans NOT DETECTED NOT DETECTED Final   Candida glabrata NOT DETECTED NOT DETECTED Final   Candida krusei NOT DETECTED NOT DETECTED Final   Candida parapsilosis NOT DETECTED NOT DETECTED Final   Candida tropicalis NOT DETECTED NOT DETECTED Final    Comment: Performed at [facility], [place]. [address] [place], [place] [address]  SARS CORONAVIRUS 2  (TAT 6-24 HRS) Nasopharyngeal Nasopharyngeal Swab     Status: None   Collection Time: 06/03/19 12:35 AM   Specimen: Nasopharyngeal Swab  Result Value Ref Range Status   SARS Coronavirus 2 NEGATIVE NEGATIVE Final    Comment: (NOTE) SARS-CoV-2 target nucleic acids are NOT DETECTED. The SARS-CoV-2 RNA is generally detectable in upper and lower respiratory specimens during the acute phase of infection. Negative results do not preclude SARS-CoV-2 infection, do not rule out co-infections with other pathogens, and should not be used as the sole basis for treatment or other patient management decisions. Negative results must be combined with clinical observations, patient history, and epidemiological information. The expected result is Negative. Fact Sheet for Patients: SugarRoll.be Fact Sheet for Healthcare Providers: https://www.woods-mathews.com/ This test is not yet approved or cleared by the [place] FDA and  has been authorized for detection and/or diagnosis of SARS-CoV-2 by FDA under an Emergency Use Authorization (EUA). This EUA will remain  in effect (meaning this test can be used) for the duration of the COVID-19 declaration under Section 56 4(b)(1) of the Act, 21 U.S.C. section 360bbb-3(b)(1), unless the authorization is terminated or revoked sooner. Performed at [facility], [place] [address], [place], [place] [address]   Expectorated sputum assessment w rflx to resp cult     Status: None   Collection Time: 06/03/19  4:41 AM   Specimen: Expectorated Sputum  Result Value Ref Range Status   Specimen Description EXPECTORATED SPUTUM  Final   Special Requests NONE  Final   Sputum evaluation   Final    Sputum specimen not acceptable for testing.  Please recollect.   NOFIFIED [facility] AT [id number] ON 06/03/19 BY A,MOHAMED Performed at [facility], [place] [address], [place],  [address]    Report Status 06/03/2019  FINAL  Final  Respiratory Panel by PCR     Status: None   Collection Time: 06/03/19 10:12 AM   Specimen: Nasopharyngeal Swab; Respiratory  Result Value Ref Range Status   Adenovirus NOT DETECTED  NOT DETECTED Final   Coronavirus 229E NOT DETECTED NOT DETECTED Final    Comment: (NOTE) The Coronavirus on the Respiratory Panel, DOES NOT test for the novel  Coronavirus (2019 nCoV)    Coronavirus HKU1 NOT DETECTED NOT DETECTED Final   Coronavirus NL63 NOT DETECTED NOT DETECTED Final   Coronavirus OC43 NOT DETECTED NOT DETECTED Final   Metapneumovirus NOT DETECTED NOT DETECTED Final   Rhinovirus / Enterovirus NOT DETECTED NOT DETECTED Final   Influenza A NOT DETECTED NOT DETECTED Final   Influenza B NOT DETECTED NOT DETECTED Final   Parainfluenza Virus 1 NOT DETECTED NOT DETECTED Final   Parainfluenza Virus 2 NOT DETECTED NOT DETECTED Final   Parainfluenza Virus 3 NOT DETECTED NOT DETECTED Final   Parainfluenza Virus 4 NOT DETECTED NOT DETECTED Final   Respiratory Syncytial Virus NOT DETECTED NOT DETECTED Final   Bordetella pertussis NOT DETECTED NOT DETECTED Final   Chlamydophila pneumoniae NOT DETECTED NOT DETECTED Final   Mycoplasma pneumoniae NOT DETECTED NOT DETECTED Final    Comment: Performed at [facility], [place] [address] [place], [place] [address]  Culture, respiratory     Status: None (Preliminary result)   Collection Time: 06/03/19  4:40 PM   Specimen: SPU  Result Value Ref Range Status   Specimen Description   Final    SPUTUM Performed at [facility] [address] [place], [place] [address]    Special Requests   Final    NONE Performed at [facility], [place] [address] [place], [place] [address]    Gram Stain   Final    RARE WBC PRESENT, PREDOMINANTLY PMN RARE GRAM POSITIVE COCCI IN PAIRS RARE BUDDING YEAST SEEN    Culture   Final    FEW YEAST IDENTIFICATION TO FOLLOW CULTURE REINCUBATED FOR BETTER GROWTH Performed  at [facility], [place] [address], [place], [place] [address]    Report Status PENDING  Incomplete  MRSA PCR Screening     Status: None   Collection Time: 06/03/19  8:11 PM   Specimen: Nasopharyngeal  Result Value Ref Range Status   MRSA by PCR NEGATIVE NEGATIVE Final    Comment:        The GeneXpert MRSA Assay (FDA approved for NASAL specimens only), is one component of a comprehensive MRSA colonization surveillance program. It is not intended to diagnose MRSA infection nor to guide or monitor treatment for MRSA infections. Performed at [facility], [place] [address] [place], [place] [address]     Studies/Results: ECHOCARDIOGRAM COMPLETE  Result Date: 06/04/2019   ECHOCARDIOGRAM REPORT   Patient Name:   Howard Fox Date of Exam: 06/04/2019 Medical Rec #:  [id number]      Height:       72.0 in Accession #:    [id number]     Weight:       242.0 lb Date of Birth:  1961-02-19      BSA:          2.31 m Patient Age:    62 years       BP:           132/75 mmHg Patient Gender: M              HR:           95 bpm. Exam Location:  Inpatient Procedure: 2D Echo, Cardiac Doppler and Color Doppler Indications:    Bacteremia  History:  Patient has prior history of Echocardiogram examinations, most                 recent 10/10/2018. Signs/Symptoms:Bacteremia; Risk                 Factors:Hypertension, Dyslipidemia and Diabetes. Hep. C, IVDU,                 MRSA, T-spine discitis.  Sonographer:    Dustin Flock Referring Phys: Nucla  1. Left ventricular ejection fraction, by visual estimation, is 55 to 60%. The left ventricle has normal function. There is no left ventricular hypertrophy.  2. Left ventricular diastolic parameters are indeterminate.  3. The left ventricle has no regional wall motion abnormalities.  4. Global right ventricle has normal systolic function.The right ventricular size is normal. No increase in right ventricular wall  thickness.  5. Left atrial size was normal.  6. Right atrial size was normal.  7. The mitral valve is normal in structure. No evidence of mitral valve regurgitation. No evidence of mitral stenosis.  8. The tricuspid valve is normal in structure.  9. The aortic valve is normal in structure. Aortic valve regurgitation is not visualized. No evidence of aortic valve sclerosis or stenosis. 10. The pulmonic valve was normal in structure. Pulmonic valve regurgitation is not visualized. 11. The inferior vena cava is normal in size with greater than 50% respiratory variability, suggesting right atrial pressure of 3 mmHg. FINDINGS  Left Ventricle: Left ventricular ejection fraction, by visual estimation, is 55 to 60%. The left ventricle has normal function. The left ventricle has no regional wall motion abnormalities. There is no left ventricular hypertrophy. Left ventricular diastolic parameters are indeterminate. Indeterminate filling pressures. Right Ventricle: The right ventricular size is normal. No increase in right ventricular wall thickness. Global RV systolic function is has normal systolic function. Left Atrium: Left atrial size was normal in size. Right Atrium: Right atrial size was normal in size Pericardium: There is no evidence of pericardial effusion. Mitral Valve: The mitral valve is normal in structure. No evidence of mitral valve regurgitation. No evidence of mitral valve stenosis by observation. Tricuspid Valve: The tricuspid valve is normal in structure. Tricuspid valve regurgitation is not demonstrated. Aortic Valve: The aortic valve is normal in structure. Aortic valve regurgitation is not visualized. The aortic valve is structurally normal, with no evidence of sclerosis or stenosis. Pulmonic Valve: The pulmonic valve was normal in structure. Pulmonic valve regurgitation is not visualized. Pulmonic regurgitation is not visualized. Aorta: The aortic root, ascending aorta and aortic arch are all  structurally normal, with no evidence of dilitation or obstruction. Venous: The inferior vena cava is normal in size with greater than 50% respiratory variability, suggesting right atrial pressure of 3 mmHg. IAS/Shunts: No atrial level shunt detected by color flow Doppler. There is no evidence of a patent foramen ovale. No ventricular septal defect is seen or detected. There is no evidence of an atrial septal defect. Additional Comments: No vegetations are seen. Consider TEE if the suspicion for endocarditis is high.  LEFT VENTRICLE PLAX 2D LVIDd:         5.21 cm  Diastology LVIDs:         3.38 cm  LV e' lateral:   7.94 cm/s LV PW:         1.09 cm  LV E/e' lateral: 8.6 LV IVS:        1.13 cm  LV e' medial:    5.22  cm/s LVOT diam:     2.50 cm  LV E/e' medial:  13.1 LV SV:         83 ml LV SV Index:   34.81 LVOT Area:     4.91 cm  RIGHT VENTRICLE RV Basal diam:  2.69 cm RV S prime:     13.20 cm/s TAPSE (M-mode): 2.9 cm LEFT ATRIUM             Index       RIGHT ATRIUM           Index LA diam:        3.50 cm 1.51 cm/m  RA Area:     14.20 cm LA Vol (A2C):   48.1 ml 20.82 ml/m RA Volume:   40.60 ml  17.57 ml/m LA Vol (A4C):   51.2 ml 22.16 ml/m LA Biplane Vol: 51.8 ml 22.42 ml/m  AORTIC VALVE LVOT Vmax:   99.80 cm/s LVOT Vmean:  65.700 cm/s LVOT VTI:    0.206 m  AORTA Ao Root diam: 3.70 cm MITRAL VALVE MV Area (PHT): 5.66 cm             SHUNTS MV PHT:        38.86 msec           Systemic VTI:  0.21 m MV Decel Time: 134 msec             Systemic Diam: 2.50 cm MV E velocity: 68.40 cm/s 103 cm/s MV A velocity: 65.50 cm/s 70.3 cm/s MV E/A ratio:  1.04       1.5  Mihai Croitoru MD Electronically signed by Sanda Klein MD Signature Date/Time: 06/04/2019/6:09:26 PM    Final      Assessment/Plan: 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  Would suggest TEE  (cardsmaster notified and will eval) Repeat BCx pending as of this AM.  Strongly consider Imaging of his spine.  Hypoxia noted off O2 however he is comfortable in room on RA. If this changes, would consider CT chest.           Bobby Rumpf MD, FACP Infectious Diseases (pager) [phone] [email or web address] 06/05/2019, 10:14 AM  LOS: 2 days

## 2019-06-05 NOTE — Progress Notes (Signed)
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  Symptoms have been present for the past couple of days, progressive, and came to the hospital.  His symptoms remind him of his pneumonia from last year.  In the ED he was febrile, tachypneic, hypoxic requiring supplemental oxygen.  COVID-19 was negative.  He was admitted to the hospital and placed on broad-spectrum antibiotics for Pneumonia and after admission his blood cultures showed MRSA bacteremia.  06/05/2019-he is resting in bed anxious to go home.  Saturation dropped to 77% overnight when he took off his oxygen accidentally in his sleep.  Currently he is on 6 L of oxygen to keep saturation above 92%.  Assessment & Plan:   Principal Problem:   Community acquired pneumonia Active Problems:   Bipolar disorder ([place])   Essential hypertension   Hyperlipidemia, mixed   Vitamin D deficiency   Vertebral osteomyelitis (H[facility])   Fever   Dyspnea   Chronic pain syndrome   Pneumonia  #1 acute hypoxic respiratory failure secondary to pneumonia versus edema.  Patient was initially treated with Rocephin azithromycin and Vanco. T-max 100.5 Leukocytosis worse today Defer imaging to ID ID has been consulted and narrowed antibiotics down to vancomycin.  Echo shows normal ejection fraction.Left ventricular ejection fraction, by visual estimation, is 55 to 60%. The left ventricle has normal function. There is no left ventricular hypertrophy.  Left ventricular diastolic parameters are indeterminate.  The left ventricle has no regional wall motion abnormalities.  Global right  ventricle has normal systolic function.The right ventricular size is normal. No increase in right ventricular wall thickness.  Left atrial size was normal.  Right atrial size was normal.  The mitral valve is normal in structure. No evidence of mitral valve regurgitation. No evidence of mitral stenosis.  The tricuspid valve is normal in structure.  The aortic valve is normal in structure. Aortic valve regurgitation is not visualized. No evidence of aortic valve sclerosis or stenosis. The pulmonic valve was normal in structure. Pulmonic valve regurgitation is not visualized.  The inferior vena cava is normal in size with greater than 50% respiratory variability, suggesting right atrial pressure of 3 mmHg.   However he is still hypoxic I will give him 1 dose of Lasix.  #2 MRSA bacteremia with history of T-spine discitis-patient stopped doxy as an outpatient.  Now on IV Vanco per ID.  #3 history of hepatitis C patient received treatment and tested negative.  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   Weight as of this encounter: 109.8 kg.  DVT prophylaxis: Lovenox Code Status: Full code  family Communication: Discussed with patient Disposition Plan patient is being treated with vancomycin for MRSA bacteremia still with ongoing work-up still with leukocytosis and significant hypoxia and fever Consultants:   ID  Procedures: None  Antimicrobials: Vancomycin  Subjective: He is resting in bed refuses to wear oxygen  denies nausea vomiting feels his breathing is okay  Lives with his mother  Objective: Vitals:   06/05/19 0154 06/05/19 0648 06/05/19 0822 06/05/19 1036  BP:  128/71    Pulse:  91    Resp:  20    Temp:  (!) 100.5 F (38.1 C)      TempSrc:  Oral    SpO2: 90% 90% 90% 90%  Weight:      Height:        Intake/Output Summary (Last 24 hours) at 06/05/2019 1039 Last data filed at 06/04/2019 2249 Gross per 24 hour  Intake 1555.97 ml  Output 1000 ml  Net 555.97 ml   Filed Weights   06/02/19 2232  Weight: 109.8 kg    Examination:  General exam: Appears calm and comfortable  Respiratory system: Scattered rhonchi both lung fields to auscultation. Respiratory effort normal. Cardiovascular system: S1 & S2 heard, RRR. No JVD, murmurs, rubs, gallops or clicks. No pedal edema. Gastrointestinal system: Abdomen is distended, soft and nontender. No organomegaly or masses felt. Normal bowel sounds heard. Central nervous system: Alert and oriented. No focal neurological deficits. Extremities: Symmetric 5 x 5 power. Skin: No rashes, lesions or ulcers Psychiatry: Judgement and insight appear normal. Mood & affect appropriate.     Data Reviewed: I have personally reviewed following labs and imaging studies  CBC: Recent Labs  Lab 06/02/19 2235 06/03/19 0212 06/03/19 0613 06/05/19 0616  WBC 9.9 9.5 10.3 14.3*  NEUTROABS 8.5*  --  8.5*  --   HGB 12.8* 11.9* 12.7* 12.2*  HCT 39.4 37.6* 39.1 38.2*  MCV 88.9 90.0 89.1 90.3  PLT 348 345 353 [id number]   Basic Metabolic Panel: Recent Labs  Lab 06/02/19 2235 06/03/19 0212 06/03/19 0613 06/04/19 0519 06/05/19 0616  NA 134*  --  136  --  135  K 3.7  --  3.5  --  4.4  CL 99  --  99  --  100  CO2 24  --  23  --  23  GLUCOSE 131*  --  147*  --  126*  BUN 9  --  8  --  15  CREATININE 0.82 0.89 0.76 0.82 0.86  CALCIUM 9.1  --  8.7*  --  8.9   GFR: Estimated Creatinine Clearance: 121.3 mL/min (by C-G formula based on SCr of 0.86 mg/dL). Liver Function Tests: Recent Labs  Lab 06/02/19 2235 06/05/19 0616  AST 28 27  ALT 21 24  ALKPHOS 99 103  BILITOT 0.3 0.4  PROT 8.0 7.3  ALBUMIN 3.1* 2.8*   No results for input(s): LIPASE, AMYLASE in the last 168 hours. No  results for input(s): AMMONIA in the last 168 hours. Coagulation Profile: No results for input(s): INR, PROTIME in the last 168 hours. Cardiac Enzymes: No results for input(s): CKTOTAL, CKMB, CKMBINDEX, TROPONINI in the last 168 hours. BNP (last 3 results) No results for input(s): PROBNP in the last 8760 hours. HbA1C: No results for input(s): HGBA1C in the last 72 hours. CBG: No results for input(s): GLUCAP in the last 168 hours. Lipid Profile: No results for input(s): CHOL, HDL, LDLCALC, TRIG, CHOLHDL, LDLDIRECT in the last 72 hours. Thyroid Function Tests: No results for input(s): TSH, T4TOTAL, FREET4, T3FREE, THYROIDAB in the last 72 hours. Anemia Panel: Recent Labs    06/03/19 0113  FERRITIN 87   Sepsis Labs: Recent Labs  Lab 06/02/19 2235 06/03/19 0035 06/03/19 0212 06/03/19 0613 06/04/19 0519  PROCALCITON  --   --  0.14  0.16 <0.10  LATICACIDVEN 1.2 1.1  --   --   --     Recent Results (from the past 240 hour(s))  Blood culture (routine x 2)     Status: Abnormal   Collection Time: 06/02/19 10:45 PM   Specimen: BLOOD  Result Value Ref Range Status   Specimen Description   Final    BLOOD LEFT ANTECUBITAL Performed at [facility] [address] [place] [address], [place], [place] [address]    Special Requests   Final    BOTTLES DRAWN AEROBIC AND ANAEROBIC Blood Culture adequate volume   Culture  Setup Time   Final    GRAM POSITIVE COCCI IN CLUSTERS IN BOTH AEROBIC AND ANAEROBIC BOTTLES CRITICAL RESULT CALLED TO, READ BACK BY AND VERIFIED WITH: Shelda Jakes PHARMD 1640 06/03/19 A BROWNING    Culture (A)  Final    STAPHYLOCOCCUS AUREUS SUSCEPTIBILITIES PERFORMED ON PREVIOUS CULTURE WITHIN THE LAST 5 DAYS. Performed at [facility], [place] [address], [place], [place] [address]    Report Status 06/05/2019 FINAL  Final  Blood culture (routine x 2)     Status: Abnormal   Collection Time: 06/03/19 12:05 AM   Specimen: BLOOD  Result Value Ref Range Status    Specimen Description   Final    BLOOD RIGHT ANTECUBITAL Performed at [facility], [place] [address], [place], [place] [address]    Special Requests   Final    BOTTLES DRAWN AEROBIC AND ANAEROBIC Blood Culture adequate volume Performed at [facility] [address], [place], [place] [address]    Culture  Setup Time   Final    GRAM POSITIVE COCCI IN CLUSTERS IN BOTH AEROBIC AND ANAEROBIC BOTTLES CRITICAL RESULT CALLED TO, READ BACK BY AND VERIFIED WITH: J LEGGE PHARMD 2200 06/03/19 A BROWNING Performed at [facility], [place] [address], [place], [place] [address]    Culture METHICILLIN RESISTANT STAPHYLOCOCCUS AUREUS (A)  Final   Report Status 06/05/2019 FINAL  Final   Organism ID, Bacteria METHICILLIN RESISTANT STAPHYLOCOCCUS AUREUS  Final      Susceptibility   Methicillin resistant staphylococcus aureus - MIC*    CIPROFLOXACIN >=8 RESISTANT Resistant     ERYTHROMYCIN >=8 RESISTANT Resistant     GENTAMICIN <=0.5 SENSITIVE Sensitive     OXACILLIN >=4 RESISTANT Resistant     TETRACYCLINE <=1 SENSITIVE Sensitive     VANCOMYCIN 1 SENSITIVE Sensitive     TRIMETH/SULFA <=10 SENSITIVE Sensitive     CLINDAMYCIN <=0.25 SENSITIVE Sensitive     RIFAMPIN <=0.5 SENSITIVE Sensitive     Inducible Clindamycin NEGATIVE Sensitive     * METHICILLIN RESISTANT STAPHYLOCOCCUS AUREUS  Blood Culture ID Panel (Reflexed)     Status: Abnormal   Collection Time: 06/03/19 12:05 AM  Result Value Ref Range Status   Enterococcus species NOT DETECTED NOT DETECTED Final   Listeria monocytogenes NOT DETECTED NOT DETECTED Final   Staphylococcus species DETECTED (A) NOT DETECTED Final    Comment: CRITICAL RESULT CALLED TO, READ BACK BY AND VERIFIED WITH: J LEGGE PHARMD 2200 06/03/19 A BROWNING    Staphylococcus aureus (BCID) DETECTED (A) NOT DETECTED Final    Comment: Methicillin (oxacillin)-resistant Staphylococcus aureus (MRSA). MRSA is predictably resistant to beta-lactam antibiotics  (except ceftaroline). Preferred therapy is vancomycin unless clinically contraindicated. Patient requires contact precautions if  hospitalized. CRITICAL RESULT CALLED TO, READ BACK BY AND VERIFIED WITH: J LEGGE PHARMD 2200 06/03/19 A BROWNING    Methicillin resistance DETECTED (A)  NOT DETECTED Final    Comment: CRITICAL RESULT CALLED TO, READ BACK BY AND VERIFIED WITH: J LEGGE PHARMD 2200 06/03/19 A BROWNING    Streptococcus species NOT DETECTED NOT DETECTED Final   Streptococcus agalactiae NOT DETECTED NOT DETECTED Final   Streptococcus pneumoniae NOT DETECTED NOT DETECTED Final   Streptococcus pyogenes NOT DETECTED NOT DETECTED Final   Acinetobacter baumannii NOT DETECTED NOT DETECTED Final   Enterobacteriaceae species NOT DETECTED NOT DETECTED Final   Enterobacter cloacae complex NOT DETECTED NOT DETECTED Final   Escherichia coli NOT DETECTED NOT DETECTED Final   Klebsiella oxytoca NOT DETECTED NOT DETECTED Final   Klebsiella pneumoniae NOT DETECTED NOT DETECTED Final   Proteus species NOT DETECTED NOT DETECTED Final   Serratia marcescens NOT DETECTED NOT DETECTED Final   Haemophilus influenzae NOT DETECTED NOT DETECTED Final   Neisseria meningitidis NOT DETECTED NOT DETECTED Final   Pseudomonas aeruginosa NOT DETECTED NOT DETECTED Final   Candida albicans NOT DETECTED NOT DETECTED Final   Candida glabrata NOT DETECTED NOT DETECTED Final   Candida krusei NOT DETECTED NOT DETECTED Final   Candida parapsilosis NOT DETECTED NOT DETECTED Final   Candida tropicalis NOT DETECTED NOT DETECTED Final    Comment: Performed at [facility], [place]. [address] [place], [place] [address]  SARS CORONAVIRUS 2 (TAT 6-24 HRS) Nasopharyngeal Nasopharyngeal Swab     Status: None   Collection Time: 06/03/19 12:35 AM   Specimen: Nasopharyngeal Swab  Result Value Ref Range Status   SARS Coronavirus 2 NEGATIVE NEGATIVE Final    Comment: (NOTE) SARS-CoV-2 target nucleic acids are NOT DETECTED. The  SARS-CoV-2 RNA is generally detectable in upper and lower respiratory specimens during the acute phase of infection. Negative results do not preclude SARS-CoV-2 infection, do not rule out co-infections with other pathogens, and should not be used as the sole basis for treatment or other patient management decisions. Negative results must be combined with clinical observations, patient history, and epidemiological information. The expected result is Negative. Fact Sheet for Patients: SugarRoll.be Fact Sheet for Healthcare Providers: https://www.woods-.com/ This test is not yet approved or cleared by the [place] FDA and  has been authorized for detection and/or diagnosis of SARS-CoV-2 by FDA under an Emergency Use Authorization (EUA). This EUA will remain  in effect (meaning this test can be used) for the duration of the COVID-19 declaration under Section 56 4(b)(1) of the Act, 21 U.S.C. section 360bbb-3(b)(1), unless the authorization is terminated or revoked sooner. Performed at [facility], [place] [address], [place], [place] [address]   Expectorated sputum assessment w rflx to resp cult     Status: None   Collection Time: 06/03/19  4:41 AM   Specimen: Expectorated Sputum  Result Value Ref Range Status   Specimen Description EXPECTORATED SPUTUM  Final   Special Requests NONE  Final   Sputum evaluation   Final    Sputum specimen not acceptable for testing.  Please recollect.   NOFIFIED [facility] AT [id number] ON 06/03/19 BY A,MOHAMED Performed at [facility], [place] [address], [place], [place] [address]    Report Status 06/03/2019 FINAL  Final  Respiratory Panel by PCR     Status: None   Collection Time: 06/03/19 10:12 AM   Specimen: Nasopharyngeal Swab; Respiratory  Result Value Ref Range Status   Adenovirus NOT DETECTED NOT DETECTED Final   Coronavirus 229E NOT DETECTED NOT DETECTED Final    Comment:  (NOTE) The Coronavirus on the Respiratory  Panel, DOES NOT test for the novel  Coronavirus (2019 nCoV)    Coronavirus HKU1 NOT DETECTED NOT DETECTED Final   Coronavirus NL63 NOT DETECTED NOT DETECTED Final   Coronavirus OC43 NOT DETECTED NOT DETECTED Final   Metapneumovirus NOT DETECTED NOT DETECTED Final   Rhinovirus / Enterovirus NOT DETECTED NOT DETECTED Final   Influenza A NOT DETECTED NOT DETECTED Final   Influenza B NOT DETECTED NOT DETECTED Final   Parainfluenza Virus 1 NOT DETECTED NOT DETECTED Final   Parainfluenza Virus 2 NOT DETECTED NOT DETECTED Final   Parainfluenza Virus 3 NOT DETECTED NOT DETECTED Final   Parainfluenza Virus 4 NOT DETECTED NOT DETECTED Final   Respiratory Syncytial Virus NOT DETECTED NOT DETECTED Final   Bordetella pertussis NOT DETECTED NOT DETECTED Final   Chlamydophila pneumoniae NOT DETECTED NOT DETECTED Final   Mycoplasma pneumoniae NOT DETECTED NOT DETECTED Final    Comment: Performed at [facility], [place] [address] [place], [place] [address]  Culture, respiratory     Status: None (Preliminary result)   Collection Time: 06/03/19  4:40 PM   Specimen: SPU  Result Value Ref Range Status   Specimen Description   Final    SPUTUM Performed at [facility] [address], [place], [place] [address]    Special Requests   Final    NONE Performed at [facility], [place] [address] [place], [place] [address]    Gram Stain   Final    RARE WBC PRESENT, PREDOMINANTLY PMN RARE GRAM POSITIVE COCCI IN PAIRS RARE BUDDING YEAST SEEN    Culture   Final    FEW YEAST IDENTIFICATION TO FOLLOW CULTURE REINCUBATED FOR BETTER GROWTH Performed at [facility], [place] [address], [place], [place] [address]    Report Status PENDING  Incomplete  MRSA PCR Screening     Status: None   Collection Time: 06/03/19  8:11 PM   Specimen: Nasopharyngeal  Result Value Ref Range Status   MRSA by PCR NEGATIVE NEGATIVE Final     Comment:        The GeneXpert MRSA Assay (FDA approved for NASAL specimens only), is one component of a comprehensive MRSA colonization surveillance program. It is not intended to diagnose MRSA infection nor to guide or monitor treatment for MRSA infections. Performed at [facility], [place] [address] [place], [place] [address]          Radiology Studies: ECHOCARDIOGRAM COMPLETE  Result Date: 06/04/2019   ECHOCARDIOGRAM REPORT   Patient Name:   ROCH DRUIN Packard Date of Exam: 06/04/2019 Medical Rec #:  [id number]      Height:       72.0 in Accession #:    [id number]     Weight:       242.0 lb Date of Birth:  Jul 25, 1960      BSA:          2.31 m Patient Age:    63 years       BP:           132/75 mmHg Patient Gender: M              HR:           95 bpm. Exam Location:  Inpatient Procedure: 2D Echo, Cardiac Doppler and Color Doppler Indications:    Bacteremia  History:        Patient has prior history of Echocardiogram examinations, most  recent 10/10/2018. Signs/Symptoms:Bacteremia; Risk                 Factors:Hypertension, Dyslipidemia and Diabetes. Hep. C, IVDU,                 MRSA, T-spine discitis.  Sonographer:    Dustin Flock Referring Phys: [place]  1. Left ventricular ejection fraction, by visual estimation, is 55 to 60%. The left ventricle has normal function. There is no left ventricular hypertrophy.  2. Left ventricular diastolic parameters are indeterminate.  3. The left ventricle has no regional wall motion abnormalities.  4. Global right ventricle has normal systolic function.The right ventricular size is normal. No increase in right ventricular wall thickness.  5. Left atrial size was normal.  6. Right atrial size was normal.  7. The mitral valve is normal in structure. No evidence of mitral valve regurgitation. No evidence of mitral stenosis.  8. The tricuspid valve is normal in structure.  9. The aortic valve is normal  in structure. Aortic valve regurgitation is not visualized. No evidence of aortic valve sclerosis or stenosis. 10. The pulmonic valve was normal in structure. Pulmonic valve regurgitation is not visualized. 11. The inferior vena cava is normal in size with greater than 50% respiratory variability, suggesting right atrial pressure of 3 mmHg. FINDINGS  Left Ventricle: Left ventricular ejection fraction, by visual estimation, is 55 to 60%. The left ventricle has normal function. The left ventricle has no regional wall motion abnormalities. There is no left ventricular hypertrophy. Left ventricular diastolic parameters are indeterminate. Indeterminate filling pressures. Right Ventricle: The right ventricular size is normal. No increase in right ventricular wall thickness. Global RV systolic function is has normal systolic function. Left Atrium: Left atrial size was normal in size. Right Atrium: Right atrial size was normal in size Pericardium: There is no evidence of pericardial effusion. Mitral Valve: The mitral valve is normal in structure. No evidence of mitral valve regurgitation. No evidence of mitral valve stenosis by observation. Tricuspid Valve: The tricuspid valve is normal in structure. Tricuspid valve regurgitation is not demonstrated. Aortic Valve: The aortic valve is normal in structure. Aortic valve regurgitation is not visualized. The aortic valve is structurally normal, with no evidence of sclerosis or stenosis. Pulmonic Valve: The pulmonic valve was normal in structure. Pulmonic valve regurgitation is not visualized. Pulmonic regurgitation is not visualized. Aorta: The aortic root, ascending aorta and aortic arch are all structurally normal, with no evidence of dilitation or obstruction. Venous: The inferior vena cava is normal in size with greater than 50% respiratory variability, suggesting right atrial pressure of 3 mmHg. IAS/Shunts: No atrial level shunt detected by color flow Doppler. There is no  evidence of a patent foramen ovale. No ventricular septal defect is seen or detected. There is no evidence of an atrial septal defect. Additional Comments: No vegetations are seen. Consider TEE if the suspicion for endocarditis is high.  LEFT VENTRICLE PLAX 2D LVIDd:         5.21 cm  Diastology LVIDs:         3.38 cm  LV e' lateral:   7.94 cm/s LV PW:         1.09 cm  LV E/e' lateral: 8.6 LV IVS:        1.13 cm  LV e' medial:    5.22 cm/s LVOT diam:     2.50 cm  LV E/e' medial:  13.1 LV SV:  83 ml LV SV Index:   34.81 LVOT Area:     4.91 cm  RIGHT VENTRICLE RV Basal diam:  2.69 cm RV S prime:     13.20 cm/s TAPSE (M-mode): 2.9 cm LEFT ATRIUM             Index       RIGHT ATRIUM           Index LA diam:        3.50 cm 1.51 cm/m  RA Area:     14.20 cm LA Vol (A2C):   48.1 ml 20.82 ml/m RA Volume:   40.60 ml  17.57 ml/m LA Vol (A4C):   51.2 ml 22.16 ml/m LA Biplane Vol: 51.8 ml 22.42 ml/m  AORTIC VALVE LVOT Vmax:   99.80 cm/s LVOT Vmean:  65.700 cm/s LVOT VTI:    0.206 m  AORTA Ao Root diam: 3.70 cm MITRAL VALVE MV Area (PHT): 5.66 cm             SHUNTS MV PHT:        38.86 msec           Systemic VTI:  0.21 m MV Decel Time: 134 msec             Systemic Diam: 2.50 cm MV E velocity: 68.40 cm/s 103 cm/s MV A velocity: 65.50 cm/s 70.3 cm/s MV E/A ratio:  1.04       1.5  Mihai Croitoru MD Electronically signed by Sanda Klein MD Signature Date/Time: 06/04/2019/6:09:26 PM    Final         Scheduled Meds: . amitriptyline  150 mg Oral QHS  . aspirin EC  325 mg Oral Daily  . citalopram  20 mg Oral Q lunch  . enoxaparin (LOVENOX) injection  40 mg Subcutaneous Q24H  . gabapentin  300 mg Oral TID  . ibuprofen  800 mg Oral QPC lunch  . Ipratropium-Albuterol  2 puff Inhalation Q6H  . polyethylene glycol  17 g Oral Daily  . rosuvastatin  40 mg Oral Daily  . cholecalciferol  5,000 Units Oral Daily   Continuous Infusions: . vancomycin 1,250 mg (06/05/19 0922)     LOS: 2 days     Georgette Shell, MD [facility]  If 7PM-7AM, please contact night-coverage [email or web address] Password [facility] 06/05/2019, 10:39 AM

## 2019-06-06 ENCOUNTER — Encounter (HOSPITAL_COMMUNITY)
Admission: EM | Disposition: A | Discharge status: Home Health | Payer: Self-pay | Source: Home / Self Care | Attending: Internal Medicine

## 2019-06-06 ENCOUNTER — Encounter (HOSPITAL_COMMUNITY): Payer: Self-pay | Admitting: Critical Care Medicine

## 2019-06-06 ENCOUNTER — Inpatient Hospital Stay: Payer: Self-pay

## 2019-06-06 ENCOUNTER — Inpatient Hospital Stay (HOSPITAL_COMMUNITY): Payer: Medicare Other

## 2019-06-06 ENCOUNTER — Encounter (HOSPITAL_COMMUNITY): Payer: Self-pay | Admitting: Internal Medicine

## 2019-06-06 DIAGNOSIS — M542 Cervicalgia: Secondary | ICD-10-CM

## 2019-06-06 DIAGNOSIS — J189 Pneumonia, unspecified organism: Secondary | ICD-10-CM

## 2019-06-06 DIAGNOSIS — M549 Dorsalgia, unspecified: Secondary | ICD-10-CM

## 2019-06-06 LAB — CREATININE, SERUM
Creatinine, Ser: 0.78 mg/dL (ref 0.61–1.24)
GFR calc Af Amer: >60 mL/min (ref >60)
GFR calc non Af Amer: >60 mL/min (ref >60)

## 2019-06-06 LAB — CBC
HCT: 38 % — ABNORMAL LOW (ref 39.0–52.0)
Hemoglobin: 12.4 g/dL — ABNORMAL LOW (ref 13.0–17.0)
MCH: 28.8 pg (ref 26.0–34.0)
MCHC: 32.6 g/dL (ref 30.0–36.0)
MCV: 88.2 fL (ref 80.0–100.0)
Platelets: 398 10*3/uL (ref 150–400)
RBC: 4.31 MIL/uL (ref 4.22–5.81)
RDW: 14.3 % (ref 11.5–15.5)
WBC: 18.9 10*3/uL — ABNORMAL HIGH (ref 4.0–10.5)
nRBC: 0 % (ref 0.0–0.2)

## 2019-06-06 LAB — VANCOMYCIN, TROUGH: Vancomycin Tr: 8 ug/mL — ABNORMAL LOW (ref 15–20)

## 2019-06-06 LAB — VANCOMYCIN, PEAK: Vancomycin Pk: 35 ug/mL (ref 30–40)

## 2019-06-06 SURGERY — ECHOCARDIOGRAM, TRANSESOPHAGEAL
Anesthesia: Monitor Anesthesia Care

## 2019-06-06 MED ORDER — SODIUM CHLORIDE (PF) 0.9 % IJ SOLN
INTRAMUSCULAR | Status: AC
  Filled 2019-06-06: qty 50

## 2019-06-06 MED ORDER — VANCOMYCIN HCL IN DEXTROSE 750-5 MG/150ML-% IV SOLN
750.0000 mg | Freq: Three times a day (TID) | INTRAVENOUS | Status: DC
  Administered 2019-06-06 – 2019-06-12 (×18): 750 mg via INTRAVENOUS
  Filled 2019-06-06 (×20): qty 150

## 2019-06-06 MED ORDER — POTASSIUM CHLORIDE CRYS ER 10 MEQ PO TBCR
10.0000 meq | EXTENDED_RELEASE_TABLET | Freq: Two times a day (BID) | ORAL | Status: DC
  Administered 2019-06-06 – 2019-06-12 (×13): 10 meq via ORAL
  Filled 2019-06-06 (×13): qty 1

## 2019-06-06 MED ORDER — FUROSEMIDE 10 MG/ML IJ SOLN
20.0000 mg | Freq: Two times a day (BID) | INTRAMUSCULAR | Status: DC
  Administered 2019-06-06 – 2019-06-09 (×7): 20 mg via INTRAVENOUS
  Filled 2019-06-06 (×7): qty 2

## 2019-06-06 MED ORDER — IOHEXOL 300 MG/ML  SOLN
75.0000 mL | Freq: Once | INTRAMUSCULAR | Status: AC | PRN
  Administered 2019-06-06: 10:00:00 75 mL via INTRAVENOUS

## 2019-06-06 NOTE — Progress Notes (Signed)
INFECTIOUS DISEASE PROGRESS NOTE  ID: Howard Fox is a 59 y.o. male with  Principal Problem:   Community acquired pneumonia Active Problems:   Bipolar disorder ([place])   Essential hypertension   Hyperlipidemia, mixed   Vitamin D deficiency   MRSA bacteremia   Vertebral osteomyelitis (HCC)   Fever   Dyspnea   Chronic pain syndrome   Pneumonia  Subjective: C/o neck, back pain.   Abtx:  Anti-infectives (From admission, onward)   Start     Dose/Rate Route Frequency Ordered Stop   06/04/19 1000  vancomycin (VANCOREADY) IVPB 1250 mg/250 mL     1,250 mg 166.7 mL/hr over 90 Minutes Intravenous Every 12 hours 06/03/19 2214     06/03/19 2300  vancomycin (VANCOREADY) IVPB 2000 mg/400 mL     2,000 mg 200 mL/hr over 120 Minutes Intravenous  Once 06/03/19 2214 06/04/19 0941   06/03/19 2200  cefTRIAXone (ROCEPHIN) 2 g in sodium chloride 0.9 % 100 mL IVPB  Status:  Discontinued    Note to Pharmacy: Please time according to dose given in ED   2 g 200 mL/hr over 30 Minutes Intravenous Every 24 hours 06/03/19 0037 06/04/19 1650   06/03/19 2200  azithromycin (ZITHROMAX) 500 mg in sodium chloride 0.9 % 250 mL IVPB  Status:  Discontinued    Note to Pharmacy: Please time according to dose given in ED   500 mg 250 mL/hr over 60 Minutes Intravenous Every 24 hours 06/03/19 0037 06/04/19 1650   06/03/19 0000  cefTRIAXone (ROCEPHIN) 2 g in sodium chloride 0.9 % 100 mL IVPB     2 g 200 mL/hr over 30 Minutes Intravenous  Once 06/02/19 2359 06/03/19 0110   06/03/19 0000  azithromycin (ZITHROMAX) 500 mg in sodium chloride 0.9 % 250 mL IVPB     500 mg 250 mL/hr over 60 Minutes Intravenous  Once 06/02/19 2359 06/03/19 0233      Medications:  Scheduled: . amitriptyline  150 mg Oral QHS  . aspirin EC  325 mg Oral Daily  . citalopram  20 mg Oral Q lunch  . enoxaparin (LOVENOX) injection  40 mg Subcutaneous Q24H  . furosemide  20 mg Intravenous BID  . gabapentin  300 mg Oral TID  . ibuprofen   800 mg Oral QPC lunch  . ipratropium-albuterol  3 mL Nebulization QID  . polyethylene glycol  17 g Oral Daily  . potassium chloride  10 mEq Oral BID  . rosuvastatin  40 mg Oral Daily  . sodium chloride (PF)      . cholecalciferol  5,000 Units Oral Daily    Objective: Vital signs in last 24 hours: Temp:  [98.7 F (37.1 C)-100 F (37.8 C)] 100 F (37.8 C) (01/14 0643) Pulse Rate:  [90-100] 100 (01/14 0643) Resp:  [18-20] 20 (01/14 0643) BP: (118-132)/(68-81) 132/81 (01/14 0643) SpO2:  [88 %-90 %] 90 % (01/14 0643)   General appearance: alert, cooperative and no distress Resp: clear to auscultation bilaterally Cardio: regular rate and rhythm GI: normal findings: bowel sounds normal and soft, non-tender and abnormal findings:  distended  Lab Results Recent Labs    06/05/19 0616 06/06/19 0032  WBC 14.3*  --   HGB 12.2*  --   HCT 38.2*  --   NA 135  --   K 4.4  --   CL 100  --   CO2 23  --   BUN 15  --   CREATININE 0.86 0.78   Liver Panel Recent  Labs    06/05/19 0616  PROT 7.3  ALBUMIN 2.8*  AST 27  ALT 24  ALKPHOS 103  BILITOT 0.4   Sedimentation Rate No results for input(s): ESRSEDRATE in the last 72 hours. C-Reactive Protein No results for input(s): CRP in the last 72 hours.  Microbiology: Recent Results (from the past 240 hour(s))  Blood culture (routine x 2)     Status: Abnormal   Collection Time: 06/02/19 10:45 PM   Specimen: BLOOD  Result Value Ref Range Status   Specimen Description   Final    BLOOD LEFT ANTECUBITAL Performed at [facility] [address], [place], [place] [address]    Special Requests   Final    BOTTLES DRAWN AEROBIC AND ANAEROBIC Blood Culture adequate volume   Culture  Setup Time   Final    GRAM POSITIVE COCCI IN CLUSTERS IN BOTH AEROBIC AND ANAEROBIC BOTTLES CRITICAL RESULT CALLED TO, READ BACK BY AND VERIFIED WITH: Shelda Jakes PHARMD 1640 06/03/19 A BROWNING    Culture (A)  Final    STAPHYLOCOCCUS  AUREUS SUSCEPTIBILITIES PERFORMED ON PREVIOUS CULTURE WITHIN THE LAST 5 DAYS. Performed at [facility], [place] [address], [place], [place] [address]    Report Status 06/05/2019 FINAL  Final  Blood culture (routine x 2)     Status: Abnormal   Collection Time: 06/03/19 12:05 AM   Specimen: BLOOD  Result Value Ref Range Status   Specimen Description   Final    BLOOD RIGHT ANTECUBITAL Performed at [facility], [place] [address], [place], [place] [address]    Special Requests   Final    BOTTLES DRAWN AEROBIC AND ANAEROBIC Blood Culture adequate volume Performed at [facility] [address] [place], [place] [address]    Culture  Setup Time   Final    GRAM POSITIVE COCCI IN CLUSTERS IN BOTH AEROBIC AND ANAEROBIC BOTTLES CRITICAL RESULT CALLED TO, READ BACK BY AND VERIFIED WITH: J LEGGE PHARMD 2200 06/03/19 A BROWNING Performed at [facility], [place] [address], [place], [place] [address]    Culture METHICILLIN RESISTANT STAPHYLOCOCCUS AUREUS (A)  Final   Report Status 06/05/2019 FINAL  Final   Organism ID, Bacteria METHICILLIN RESISTANT STAPHYLOCOCCUS AUREUS  Final      Susceptibility   Methicillin resistant staphylococcus aureus - MIC*    CIPROFLOXACIN >=8 RESISTANT Resistant     ERYTHROMYCIN >=8 RESISTANT Resistant     GENTAMICIN <=0.5 SENSITIVE Sensitive     OXACILLIN >=4 RESISTANT Resistant     TETRACYCLINE <=1 SENSITIVE Sensitive     VANCOMYCIN 1 SENSITIVE Sensitive     TRIMETH/SULFA <=10 SENSITIVE Sensitive     CLINDAMYCIN <=0.25 SENSITIVE Sensitive     RIFAMPIN <=0.5 SENSITIVE Sensitive     Inducible Clindamycin NEGATIVE Sensitive     * METHICILLIN RESISTANT STAPHYLOCOCCUS AUREUS  Blood Culture ID Panel (Reflexed)     Status: Abnormal   Collection Time: 06/03/19 12:05 AM  Result Value Ref Range Status   Enterococcus species NOT DETECTED NOT DETECTED Final   Listeria monocytogenes NOT DETECTED NOT DETECTED Final   Staphylococcus species  DETECTED (A) NOT DETECTED Final    Comment: CRITICAL RESULT CALLED TO, READ BACK BY AND VERIFIED WITH: J LEGGE PHARMD 2200 06/03/19 A BROWNING    Staphylococcus aureus (BCID) DETECTED (A) NOT DETECTED Final    Comment: Methicillin (oxacillin)-resistant Staphylococcus aureus (MRSA). MRSA is predictably resistant to beta-lactam antibiotics (except ceftaroline). Preferred therapy is vancomycin  unless clinically contraindicated. Patient requires contact precautions if  hospitalized. CRITICAL RESULT CALLED TO, READ BACK BY AND VERIFIED WITH: J LEGGE PHARMD 2200 06/03/19 A BROWNING    Methicillin resistance DETECTED (A) NOT DETECTED Final    Comment: CRITICAL RESULT CALLED TO, READ BACK BY AND VERIFIED WITH: J LEGGE PHARMD 2200 06/03/19 A BROWNING    Streptococcus species NOT DETECTED NOT DETECTED Final   Streptococcus agalactiae NOT DETECTED NOT DETECTED Final   Streptococcus pneumoniae NOT DETECTED NOT DETECTED Final   Streptococcus pyogenes NOT DETECTED NOT DETECTED Final   Acinetobacter baumannii NOT DETECTED NOT DETECTED Final   Enterobacteriaceae species NOT DETECTED NOT DETECTED Final   Enterobacter cloacae complex NOT DETECTED NOT DETECTED Final   Escherichia coli NOT DETECTED NOT DETECTED Final   Klebsiella oxytoca NOT DETECTED NOT DETECTED Final   Klebsiella pneumoniae NOT DETECTED NOT DETECTED Final   Proteus species NOT DETECTED NOT DETECTED Final   Serratia marcescens NOT DETECTED NOT DETECTED Final   Haemophilus influenzae NOT DETECTED NOT DETECTED Final   Neisseria meningitidis NOT DETECTED NOT DETECTED Final   Pseudomonas aeruginosa NOT DETECTED NOT DETECTED Final   Candida albicans NOT DETECTED NOT DETECTED Final   Candida glabrata NOT DETECTED NOT DETECTED Final   Candida krusei NOT DETECTED NOT DETECTED Final   Candida parapsilosis NOT DETECTED NOT DETECTED Final   Candida tropicalis NOT DETECTED NOT DETECTED Final    Comment: Performed at [facility], [place].  [address], [place], [place] [address]  SARS CORONAVIRUS 2 (TAT 6-24 HRS) Nasopharyngeal Nasopharyngeal Swab     Status: None   Collection Time: 06/03/19 12:35 AM   Specimen: Nasopharyngeal Swab  Result Value Ref Range Status   SARS Coronavirus 2 NEGATIVE NEGATIVE Final    Comment: (NOTE) SARS-CoV-2 target nucleic acids are NOT DETECTED. The SARS-CoV-2 RNA is generally detectable in upper and lower respiratory specimens during the acute phase of infection. Negative results do not preclude SARS-CoV-2 infection, do not rule out co-infections with other pathogens, and should not be used as the sole basis for treatment or other patient management decisions. Negative results must be combined with clinical observations, patient history, and epidemiological information. The expected result is Negative. Fact Sheet for Patients: SugarRoll.be Fact Sheet for Healthcare Providers: https://www.woods-mathews.com/ This test is not yet approved or cleared by the [place] FDA and  has been authorized for detection and/or diagnosis of SARS-CoV-2 by FDA under an Emergency Use Authorization (EUA). This EUA will remain  in effect (meaning this test can be used) for the duration of the COVID-19 declaration under Section 56 4(b)(1) of the Act, 21 U.S.C. section 360bbb-3(b)(1), unless the authorization is terminated or revoked sooner. Performed at [facility], [place] [address] [place], [place] [address]   Expectorated sputum assessment w rflx to resp cult     Status: None   Collection Time: 06/03/19  4:41 AM   Specimen: Expectorated Sputum  Result Value Ref Range Status   Specimen Description EXPECTORATED SPUTUM  Final   Special Requests NONE  Final   Sputum evaluation   Final    Sputum specimen not acceptable for testing.  Please recollect.   NOFIFIED [facility] AT [id number] ON 06/03/19 BY A,MOHAMED Performed at [facility], [place] [address]  [place], [place] [address]    Report Status 06/03/2019 FINAL  Final  Respiratory Panel by PCR     Status: None   Collection Time: 06/03/19 10:12 AM   Specimen: Nasopharyngeal Swab;  Respiratory  Result Value Ref Range Status   Adenovirus NOT DETECTED NOT DETECTED Final   Coronavirus 229E NOT DETECTED NOT DETECTED Final    Comment: (NOTE) The Coronavirus on the Respiratory Panel, DOES NOT test for the novel  Coronavirus (2019 nCoV)    Coronavirus HKU1 NOT DETECTED NOT DETECTED Final   Coronavirus NL63 NOT DETECTED NOT DETECTED Final   Coronavirus OC43 NOT DETECTED NOT DETECTED Final   Metapneumovirus NOT DETECTED NOT DETECTED Final   Rhinovirus / Enterovirus NOT DETECTED NOT DETECTED Final   Influenza A NOT DETECTED NOT DETECTED Final   Influenza B NOT DETECTED NOT DETECTED Final   Parainfluenza Virus 1 NOT DETECTED NOT DETECTED Final   Parainfluenza Virus 2 NOT DETECTED NOT DETECTED Final   Parainfluenza Virus 3 NOT DETECTED NOT DETECTED Final   Parainfluenza Virus 4 NOT DETECTED NOT DETECTED Final   Respiratory Syncytial Virus NOT DETECTED NOT DETECTED Final   Bordetella pertussis NOT DETECTED NOT DETECTED Final   Chlamydophila pneumoniae NOT DETECTED NOT DETECTED Final   Mycoplasma pneumoniae NOT DETECTED NOT DETECTED Final    Comment: Performed at [facility], [place] [address], [place], [place] [address]  Culture, respiratory     Status: None (Preliminary result)   Collection Time: 06/03/19  4:40 PM   Specimen: SPU  Result Value Ref Range Status   Specimen Description   Final    SPUTUM Performed at [facility] [address], [place], [place] [address]    Special Requests   Final    NONE Performed at [facility], [place] [address] [place], [place] [address]    Gram Stain   Final    RARE WBC PRESENT, PREDOMINANTLY PMN RARE GRAM POSITIVE COCCI IN PAIRS RARE BUDDING YEAST SEEN    Culture   Final    FEW YEAST IDENTIFICATION TO  FOLLOW CULTURE REINCUBATED FOR BETTER GROWTH Performed at [facility], [place] [address] [place], [place] [address]    Report Status PENDING  Incomplete  MRSA PCR Screening     Status: None   Collection Time: 06/03/19  8:11 PM   Specimen: Nasopharyngeal  Result Value Ref Range Status   MRSA by PCR NEGATIVE NEGATIVE Final    Comment:        The GeneXpert MRSA Assay (FDA approved for NASAL specimens only), is one component of a comprehensive MRSA colonization surveillance program. It is not intended to diagnose MRSA infection nor to guide or monitor treatment for MRSA infections. Performed at [facility], [place] [address], [place], [place] [address]     Studies/Results: MR THORACIC SPINE WO CONTRAST  Result Date: 06/05/2019 CLINICAL DATA:  Mid back pain. Febrile. Prior fusion from T8-L2. History of vertebral osteomyelitis. EXAM: MRI THORACIC AND LUMBAR SPINE WITHOUT CONTRAST TECHNIQUE: Multiplanar and multiecho pulse sequences of the thoracic and lumbar spine were obtained without intravenous contrast. The patient refused to continue because he got hot. He said he would return at another time for contrast. COMPARISON:  Chest x-ray dated 06/02/2019 and thoracic radiographs dated 05/23/2019 and thoracic MRI dated 09/30/2018 FINDINGS: MRI THORACIC SPINE FINDINGS Alignment: Accentuated lower thoracic kyphosis secondary to previous bone destruction at T10-11. Vertebrae: Hardware obscures some detail from T8 distally. No visible discitis or osteomyelitis or other significant acute bone abnormality. Cord: The visualized portion of the thoracic spinal cord appears normal. Hardware obscures the spinal cord at T10 extending distally. Paraspinal and other soft tissues: There is an abnormal  appearance of both lungs, left worse than right. Recent chest x-ray of 06/02/2019 did not demonstrate significant abnormalities. This appearance may be artifactual. Disc levels: There are tiny  disc bulges at T2-3 and T3-4 without neural impingement. The discs from C7-T1 through T6-7 are otherwise normal. No spinal or foraminal stenoses. T7-8: Tiny disc bulge to the right of midline with no neural impingement. T8-9: Negative. T9-10: Negative. T10-11: The details of the T10 and T11 vertebral bodies is obscured by hardware. The appearance suggests that those vertebral bodies have fused with Ace secondary anterior wedge deformity due to the previous osteomyelitis. T11-12: No disc bulging or protrusion. T12-L1: No visible abnormality. MRI LUMBAR SPINE FINDINGS Segmentation:  Standard. Alignment:  2 mm retrolisthesis of L4 on L5. Vertebrae: Pedicle screws extend from T8-L2. No acute abnormality of the lumbar spine. Conus medullaris and cauda equina: Conus extends to the L1-2 level. Conus and cauda equina appear normal. Paraspinal and other soft tissues: Negative. Disc levels: T12-L1 and L1-2: Normal disc. Pedicle screws. L2-3: Small broad-based disc bulge without neural impingement. No facet arthritis. Pedicle screws in L2. L3-4: Small broad-based disc bulge asymmetric into the left neural foramen. Moderate bilateral facet arthritis with ligamentum flavum hypertrophy creating mild spinal stenosis with slight left foraminal stenosis. However, the left L3 nerve exits without impingement. Nearing of both lateral recesses. L4-5: Small disc bulge slightly asymmetric to the right. Moderate hypertrophy and arthritis of the right facet joint. No neural impingement. L5-S1: Marked disc space narrowing with degenerative changes of the vertebral endplates. Small broad-based disc protrusion with accompanying osteophytes extending into both neural foramina without severe foraminal stenosis. No focal neural impingement. Minimal degenerative changes of the right facet joint. IMPRESSION: MR THORACIC SPINE IMPRESSION No discrete osteomyelitis or discitis in the thoracic spine. Detail is limited at the level of the previous  discitis and osteomyelitis at T10-11. 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 The thoracic spine otherwise demonstrates no significant abnormalities. MR LUMBAR SPINE IMPRESSION 1. 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 2. Moderate right facet arthritis at L4-5. Electronically Signed   By: Lorriane Shire M.D.   On: 06/05/2019 14:57   MR LUMBAR SPINE WO CONTRAST  Result Date: 06/05/2019 CLINICAL DATA:  Mid back pain. Febrile. Prior fusion from T8-L2. History of vertebral osteomyelitis. EXAM: MRI THORACIC AND LUMBAR SPINE WITHOUT CONTRAST TECHNIQUE: Multiplanar and multiecho pulse sequences of the thoracic and lumbar spine were obtained without intravenous contrast. The patient refused to continue because he got hot. He said he would return at another time for contrast. COMPARISON:  Chest x-ray dated 06/02/2019 and thoracic radiographs dated 05/23/2019 and thoracic MRI dated 09/30/2018 FINDINGS: MRI THORACIC SPINE FINDINGS Alignment: Accentuated lower thoracic kyphosis secondary to previous bone destruction at T10-11. Vertebrae: Hardware obscures some detail from T8 distally. No visible discitis or osteomyelitis or other significant acute bone abnormality. Cord: The visualized portion of the thoracic spinal cord appears normal. Hardware obscures the spinal cord at T10 extending distally. Paraspinal and other soft tissues: There is an abnormal appearance of both lungs, left worse than right. Recent chest x-ray of 06/02/2019 did not demonstrate significant abnormalities. This appearance may be artifactual. Disc levels: There are tiny disc bulges at T2-3 and T3-4 without neural impingement. The discs from C7-T1 through T6-7 are otherwise normal. No spinal or foraminal stenoses. T7-8: Tiny disc bulge to the right of midline with no neural impingement. T8-9: Negative. T9-10: Negative. T10-11: The  details of the T10 and T11 vertebral bodies  is obscured by hardware. The appearance suggests that those vertebral bodies have fused with Ace secondary anterior wedge deformity due to the previous osteomyelitis. T11-12: No disc bulging or protrusion. T12-L1: No visible abnormality. MRI LUMBAR SPINE FINDINGS Segmentation:  Standard. Alignment:  2 mm retrolisthesis of L4 on L5. Vertebrae: Pedicle screws extend from T8-L2. No acute abnormality of the lumbar spine. Conus medullaris and cauda equina: Conus extends to the L1-2 level. Conus and cauda equina appear normal. Paraspinal and other soft tissues: Negative. Disc levels: T12-L1 and L1-2: Normal disc. Pedicle screws. L2-3: Small broad-based disc bulge without neural impingement. No facet arthritis. Pedicle screws in L2. L3-4: Small broad-based disc bulge asymmetric into the left neural foramen. Moderate bilateral facet arthritis with ligamentum flavum hypertrophy creating mild spinal stenosis with slight left foraminal stenosis. However, the left L3 nerve exits without impingement. Nearing of both lateral recesses. L4-5: Small disc bulge slightly asymmetric to the right. Moderate hypertrophy and arthritis of the right facet joint. No neural impingement. L5-S1: Marked disc space narrowing with degenerative changes of the vertebral endplates. Small broad-based disc protrusion with accompanying osteophytes extending into both neural foramina without severe foraminal stenosis. No focal neural impingement. Minimal degenerative changes of the right facet joint. IMPRESSION: MR THORACIC SPINE IMPRESSION No discrete osteomyelitis or discitis in the thoracic spine. Detail is limited at the level of the previous discitis and osteomyelitis at T10-11. 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 The thoracic spine otherwise demonstrates no significant abnormalities. MR LUMBAR SPINE IMPRESSION 1. 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 2. Moderate  right facet arthritis at L4-5. Electronically Signed   By: Lorriane Shire M.D.   On: 06/05/2019 14:57   ECHOCARDIOGRAM COMPLETE  Result Date: 06/04/2019   ECHOCARDIOGRAM REPORT   Patient Name:   BAILOR DARGIN Criger Date of Exam: 06/04/2019 Medical Rec #:  [id number]      Height:       72.0 in Accession #:    [id number]     Weight:       242.0 lb Date of Birth:  [birth year]      BSA:          2.31 m Patient Age:    46 years       BP:           132/75 mmHg Patient Gender: M              HR:           95 bpm. Exam Location:  Inpatient Procedure: 2D Echo, Cardiac Doppler and Color Doppler Indications:    Bacteremia  History:        Patient has prior history of Echocardiogram examinations, most                 recent 10/10/2018. Signs/Symptoms:Bacteremia; Risk                 Factors:Hypertension, Dyslipidemia and Diabetes. Hep. C, IVDU,                 MRSA, T-spine discitis.  Sonographer:    Dustin Flock Referring Phys: [place]  1. Left ventricular ejection fraction, by visual estimation, is 55 to 60%. The left ventricle has normal function. There is no left ventricular hypertrophy.  2. Left ventricular diastolic parameters are indeterminate.  3. The left ventricle has no regional  wall motion abnormalities.  4. Global right ventricle has normal systolic function.The right ventricular size is normal. No increase in right ventricular wall thickness.  5. Left atrial size was normal.  6. Right atrial size was normal.  7. The mitral valve is normal in structure. No evidence of mitral valve regurgitation. No evidence of mitral stenosis.  8. The tricuspid valve is normal in structure.  9. The aortic valve is normal in structure. Aortic valve regurgitation is not visualized. No evidence of aortic valve sclerosis or stenosis. 10. The pulmonic valve was normal in structure. Pulmonic valve regurgitation is not visualized. 11. The inferior vena cava is normal in size with greater than 50% respiratory  variability, suggesting right atrial pressure of 3 mmHg. FINDINGS  Left Ventricle: Left ventricular ejection fraction, by visual estimation, is 55 to 60%. The left ventricle has normal function. The left ventricle has no regional wall motion abnormalities. There is no left ventricular hypertrophy. Left ventricular diastolic parameters are indeterminate. Indeterminate filling pressures. Right Ventricle: The right ventricular size is normal. No increase in right ventricular wall thickness. Global RV systolic function is has normal systolic function. Left Atrium: Left atrial size was normal in size. Right Atrium: Right atrial size was normal in size Pericardium: There is no evidence of pericardial effusion. Mitral Valve: The mitral valve is normal in structure. No evidence of mitral valve regurgitation. No evidence of mitral valve stenosis by observation. Tricuspid Valve: The tricuspid valve is normal in structure. Tricuspid valve regurgitation is not demonstrated. Aortic Valve: The aortic valve is normal in structure. Aortic valve regurgitation is not visualized. The aortic valve is structurally normal, with no evidence of sclerosis or stenosis. Pulmonic Valve: The pulmonic valve was normal in structure. Pulmonic valve regurgitation is not visualized. Pulmonic regurgitation is not visualized. Aorta: The aortic root, ascending aorta and aortic arch are all structurally normal, with no evidence of dilitation or obstruction. Venous: The inferior vena cava is normal in size with greater than 50% respiratory variability, suggesting right atrial pressure of 3 mmHg. IAS/Shunts: No atrial level shunt detected by color flow Doppler. There is no evidence of a patent foramen ovale. No ventricular septal defect is seen or detected. There is no evidence of an atrial septal defect. Additional Comments: No vegetations are seen. Consider TEE if the suspicion for endocarditis is high.  LEFT VENTRICLE PLAX 2D LVIDd:         5.21 cm   Diastology LVIDs:         3.38 cm  LV e' lateral:   7.94 cm/s LV PW:         1.09 cm  LV E/e' lateral: 8.6 LV IVS:        1.13 cm  LV e' medial:    5.22 cm/s LVOT diam:     2.50 cm  LV E/e' medial:  13.1 LV SV:         83 ml LV SV Index:   34.81 LVOT Area:     4.91 cm  RIGHT VENTRICLE RV Basal diam:  2.69 cm RV S prime:     13.20 cm/s TAPSE (M-mode): 2.9 cm LEFT ATRIUM             Index       RIGHT ATRIUM           Index LA diam:        3.50 cm 1.51 cm/m  RA Area:     14.20 cm LA Vol (A2C):   48.1  ml 20.82 ml/m RA Volume:   40.60 ml  17.57 ml/m LA Vol (A4C):   51.2 ml 22.16 ml/m LA Biplane Vol: 51.8 ml 22.42 ml/m  AORTIC VALVE LVOT Vmax:   99.80 cm/s LVOT Vmean:  65.700 cm/s LVOT VTI:    0.206 m  AORTA Ao Root diam: 3.70 cm MITRAL VALVE MV Area (PHT): 5.66 cm             SHUNTS MV PHT:        38.86 msec           Systemic VTI:  0.21 m MV Decel Time: 134 msec             Systemic Diam: 2.50 cm MV E velocity: 68.40 cm/s 103 cm/s MV A velocity: 65.50 cm/s 70.3 cm/s MV E/A ratio:  1.04       1.5  Mihai Croitoru MD Electronically signed by Sanda Klein MD Signature Date/Time: 06/04/2019/6:09:26 PM    Final      Assessment/Plan: 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  Will order PIC.  Repeat BCx 1-13 ngtd     Bobby Rumpf MD, FACP Infectious Diseases (pager) [phone] [email or web address] 06/06/2019, 8:52 AM  LOS: 3 days

## 2019-06-06 NOTE — Progress Notes (Signed)
Pharmacy Antibiotic Note  Howard Fox is a 59 y.o. male admitted on 06/02/2019 with MRSA bacteremia.  Pharmacy has been consulted for vanc dosing.  Current ABX:  Vancomycin 1250 mg IV Q12H  Today, 06/06/2019:   Scr wnl  WBC trending up 14.3>18.9  Tmax 100F; taking ibuprofen 800 mg once daily  Vanc peak 35, Vanc trough 8, both drawn appropriately, calculated AUC 497.6  Plan:  Adjust Vancomycin 750mg  IV Q8H, calculated AUC: 447  Monitor Scr, CBC, and clinical picture  Height: 6' (182.9 cm) Weight: 242 lb (109.8 kg) IBW/kg (Calculated) : 77.6  Temp (24hrs), Avg:99.3 F (37.4 C), Min:98.7 F (37.1 C), Max:100 F (37.8 C)  Recent Labs  Lab 06/02/19 2235 06/02/19 2235 06/03/19 0035 06/03/19 0212 06/03/19 [id number] 06/04/19 0519 06/05/19 0616 06/06/19 0032 06/06/19 0944  WBC 9.9  --   --  9.5 10.3  --  14.3*  --  18.9*  CREATININE 0.82   < >  --  0.89 0.76 0.82 0.86 0.78  --   LATICACIDVEN 1.2  --  1.1  --   --   --   --   --   --   VANCOTROUGH  --   --   --   --   --   --   --   --  8*  VANCOPEAK  --   --   --   --   --   --   --  35  --    < > = values in this interval not displayed.    Estimated Creatinine Clearance: 130.4 mL/min (by C-G formula based on SCr of 0.78 mg/dL).    Allergies  Allergen Reactions  . Codeine Itching  . Dilaudid [Hydromorphone Hcl] Itching  . Shellfish Allergy Diarrhea and Nausea And Vomiting   Antimicrobials this admission: 1/12 Vanc>> 1/11 Azithromycin >1/11 1/11 CTX > 1/11  Dose adjustments this admission: Vancomycin 750mg  IV Q8H, calculated AUC: 447  Microbiology results: 1/11 BCx: 4/4 MRSA bacteremia 1/13 BCx: NG <24h  Thank you for allowing pharmacy to be a part of this patient's care.  Lanny Lipkin 06/06/2019 11:41 AM

## 2019-06-06 NOTE — Progress Notes (Signed)
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 His symptoms remind him of his pneumonia from last year. In the ED he was febrile, tachypneic, hypoxic requiring supplemental oxygen. COVID-19 was negative. He was admitted to the hospital and placed on broad-spectrum antibiotics for Pneumonia and after admission his blood cultures showed MRSA bacteremia.  06/05/2019-he is resting in bed anxious to go home.  Saturation dropped to 77% overnight when he took off his oxygen accidentally in his sleep.  Currently he is on 6 L of oxygen to keep saturation above 92%.  Assessment & Plan:   Principal Problem:   Community acquired pneumonia Active Problems:   Bipolar disorder ([facility])   Essential hypertension   Hyperlipidemia, mixed   Vitamin D deficiency   MRSA bacteremia   Vertebral osteomyelitis (HCC)   Fever   Dyspnea   Chronic pain syndrome   Pneumonia  #1 acute hypoxic respiratory failure secondary to pneumonia versus edema.  Patient was initially treated with Rocephin azithromycin and Vanco. T-max 100 trending down. Leukocytosis worse today WBC is trending up to 18.9 he is not on any steroids. Chest CT with groundglass opacities edema versus inflammation versus infection. ID has been consulted and narrowed antibiotics down to vancomycin. Lasix 20 mg twice a day. TEE was canceled for today as he is not stable and remains hypoxic.  Echo shows normal ejection fraction.Left ventricular ejection fraction, by visual  estimation, is 55 to 60%. The left ventricle has normal function. There is no left ventricular hypertrophy.  Left ventricular diastolic parameters are indeterminate.  The left ventricle has no regional wall motion abnormalities.  Global right ventricle has normal systolic function.The right ventricular size is normal. No increase in right ventricular wall thickness.  Left atrial size was normal.  Right atrial size was normal.  The mitral valve is normal in structure. No evidence of mitral valve regurgitation. No evidence of mitral stenosis. The tricuspid valve is normal in structure.  The aortic valve is normal in structure. Aortic valve regurgitation is not visualized. No evidence of aortic valve sclerosis or stenosis. The pulmonic valve was normal in structure. Pulmonic valve regurgitation is not visualized.  The inferior vena cava is normal in size with greater than 50% respiratory variability, suggesting right atrial pressure of 3 mmHg.  #2 MRSA bacteremia with history of T-spine discitis-patient stopped doxy as an outpatient.  Now on IV Vanco per ID. MRI of the thoracolumbar spine shows no evidence of osteomyelitis.  #3 history of hepatitis C patient received treatment and tested negative.  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   Weight as of this encounter: 109.8 kg.  DVT prophylaxis: Lovenox Code Status: Full code  family Communication: Discussed with patient Disposition Plan patient is being treated with vancomycin for MRSA bacteremia still with ongoing work-up still with leukocytosis  and significant hypoxia and fever Consultants:   ID, cardiology  Procedures: None  Antimicrobials: Vancomycin Subjective: He is laying flat in bed  on 6 L of oxygen does not appear to be using any accessory muscles Objective: Vitals:   06/06/19 0858 06/06/19 0905 06/06/19 1209 06/06/19 1220  BP:      Pulse:      Resp:      Temp:      TempSrc:      SpO2: 91% 91% (!) 85% (!) 86%  Weight:      Height:        Intake/Output Summary (Last 24 hours) at 06/06/2019 1303 Last data filed at 06/06/2019 0002 Gross per 24 hour  Intake 720 ml  Output 650 ml  Net 70 ml   Filed Weights   06/02/19 2232  Weight: 109.8 kg    Examination:  General exam: Appears calm and comfortable  Respiratory system: Bilateral scattered rhonchi to auscultation. Respiratory effort normal. Cardiovascular system: S1 & S2 heard, RRR. No JVD, murmurs, rubs, gallops or clicks. No pedal edema. Gastrointestinal system: Abdomen is nondistended, soft and nontender. No organomegaly or masses felt. Normal bowel sounds heard. Central nervous system: Alert and oriented. No focal neurological deficits. Extremities: Symmetric 5 x 5 power. Skin: No rashes, lesions or ulcers Psychiatry: Judgement and insight appear normal. Mood & affect appropriate.     Data Reviewed: I have personally reviewed following labs and imaging studies  CBC: Recent Labs  Lab 06/02/19 2235 06/03/19 0212 06/03/19 0613 06/05/19 0616 06/06/19 0944  WBC 9.9 9.5 10.3 14.3* 18.9*  NEUTROABS 8.5*  --  8.5*  --   --   HGB 12.8* 11.9* 12.7* 12.2* 12.4*  HCT 39.4 37.6* 39.1 38.2* 38.0*  MCV 88.9 90.0 89.1 90.3 88.2  PLT 348 345 353 [id number]   Basic Metabolic Panel: Recent Labs  Lab 06/02/19 2235 06/02/19 2235 06/03/19 0212 06/03/19 0613 06/04/19 0519 06/05/19 0616 06/06/19 0032  NA 134*  --   --  136  --  135  --   K 3.7  --   --  3.5  --  4.4  --   CL 99  --   --  99  --  100  --   CO2 24  --   --  23  --  23  --   GLUCOSE 131*  --   --  147*  --  126*  --   BUN 9  --   --  8  --  15  --   CREATININE 0.82   < > 0.89 0.76 0.82 0.86 0.78  CALCIUM 9.1  --   --  8.7*  --  8.9  --      < > = values in this interval not displayed.   GFR: Estimated Creatinine Clearance: 130.4 mL/min (by C-G formula based on SCr of 0.78 mg/dL). Liver Function Tests: Recent Labs  Lab 06/02/19 2235 06/05/19 0616  AST 28 27  ALT 21 24  ALKPHOS 99 103  BILITOT 0.3 0.4  PROT 8.0 7.3  ALBUMIN 3.1* 2.8*   No results for input(s): LIPASE, AMYLASE in the last 168 hours. No results for input(s): AMMONIA in the last 168 hours. Coagulation Profile: No results for input(s): INR, PROTIME in the last 168 hours. Cardiac Enzymes: No results for input(s): CKTOTAL, CKMB, CKMBINDEX, TROPONINI in the last 168 hours. BNP (last 3 results) No results for input(s): PROBNP in the last 8760 hours. HbA1C: No results for  input(s): HGBA1C in the last 72 hours. CBG: No results for input(s): GLUCAP in the last 168 hours. Lipid Profile: No results for input(s): CHOL, HDL, LDLCALC, TRIG, CHOLHDL, LDLDIRECT in the last 72 hours. Thyroid Function Tests: No results for input(s): TSH, T4TOTAL, FREET4, T3FREE, THYROIDAB in the last 72 hours. Anemia Panel: No results for input(s): VITAMINB12, FOLATE, FERRITIN, TIBC, IRON, RETICCTPCT in the last 72 hours. Sepsis Labs: Recent Labs  Lab 06/02/19 2235 06/03/19 0035 06/03/19 0212 06/03/19 0613 06/04/19 0519  PROCALCITON  --   --  0.14 0.16 <0.10  LATICACIDVEN 1.2 1.1  --   --   --     Recent Results (from the past 240 hour(s))  Blood culture (routine x 2)     Status: Abnormal   Collection Time: 06/02/19 10:45 PM   Specimen: BLOOD  Result Value Ref Range Status   Specimen Description   Final    BLOOD LEFT ANTECUBITAL Performed at [facility] [address], [place], [place] [address]    Special Requests   Final    BOTTLES DRAWN AEROBIC AND ANAEROBIC Blood Culture adequate volume   Culture  Setup Time   Final    GRAM POSITIVE COCCI IN CLUSTERS IN BOTH AEROBIC AND ANAEROBIC BOTTLES CRITICAL RESULT CALLED TO, READ BACK BY AND  VERIFIED WITH: Shelda Jakes PHARMD 1640 06/03/19 A BROWNING    Culture (A)  Final    STAPHYLOCOCCUS AUREUS SUSCEPTIBILITIES PERFORMED ON PREVIOUS CULTURE WITHIN THE LAST 5 DAYS. Performed at [facility], [place] [address], [place], [place] [address]    Report Status 06/05/2019 FINAL  Final  Blood culture (routine x 2)     Status: Abnormal   Collection Time: 06/03/19 12:05 AM   Specimen: BLOOD  Result Value Ref Range Status   Specimen Description   Final    BLOOD RIGHT ANTECUBITAL Performed at [facility], [place] [address], [place], [place] [address]    Special Requests   Final    BOTTLES DRAWN AEROBIC AND ANAEROBIC Blood Culture adequate volume Performed at [facility] [address] [place], [place] [address]    Culture  Setup Time   Final    GRAM POSITIVE COCCI IN CLUSTERS IN BOTH AEROBIC AND ANAEROBIC BOTTLES CRITICAL RESULT CALLED TO, READ BACK BY AND VERIFIED WITH: J LEGGE PHARMD 2200 06/03/19 A BROWNING Performed at [facility], [place] [address], [place],  [address]    Culture METHICILLIN RESISTANT STAPHYLOCOCCUS AUREUS (A)  Final   Report Status 06/05/2019 FINAL  Final   Organism ID, Bacteria METHICILLIN RESISTANT STAPHYLOCOCCUS AUREUS  Final      Susceptibility   Methicillin resistant staphylococcus aureus - MIC*    CIPROFLOXACIN >=8 RESISTANT Resistant     ERYTHROMYCIN >=8 RESISTANT Resistant     GENTAMICIN <=0.5 SENSITIVE Sensitive     OXACILLIN >=4 RESISTANT Resistant     TETRACYCLINE <=1 SENSITIVE Sensitive     VANCOMYCIN 1 SENSITIVE Sensitive     TRIMETH/SULFA <=10 SENSITIVE Sensitive     CLINDAMYCIN <=0.25 SENSITIVE Sensitive     RIFAMPIN <=0.5 SENSITIVE Sensitive     Inducible Clindamycin NEGATIVE Sensitive     * METHICILLIN RESISTANT STAPHYLOCOCCUS AUREUS  Blood Culture ID Panel (Reflexed)     Status: Abnormal   Collection Time: 06/03/19 12:05 AM  Result Value Ref Range Status   Enterococcus species NOT DETECTED NOT  DETECTED Final   Listeria monocytogenes NOT DETECTED NOT DETECTED Final   Staphylococcus  species DETECTED (A) NOT DETECTED Final    Comment: CRITICAL RESULT CALLED TO, READ BACK BY AND VERIFIED WITH: J LEGGE PHARMD 2200 06/03/19 A BROWNING    Staphylococcus aureus (BCID) DETECTED (A) NOT DETECTED Final    Comment: Methicillin (oxacillin)-resistant Staphylococcus aureus (MRSA). MRSA is predictably resistant to beta-lactam antibiotics (except ceftaroline). Preferred therapy is vancomycin unless clinically contraindicated. Patient requires contact precautions if  hospitalized. CRITICAL RESULT CALLED TO, READ BACK BY AND VERIFIED WITH: J LEGGE PHARMD 2200 06/03/19 A BROWNING    Methicillin resistance DETECTED (A) NOT DETECTED Final    Comment: CRITICAL RESULT CALLED TO, READ BACK BY AND VERIFIED WITH: J LEGGE PHARMD 2200 06/03/19 A BROWNING    Streptococcus species NOT DETECTED NOT DETECTED Final   Streptococcus agalactiae NOT DETECTED NOT DETECTED Final   Streptococcus pneumoniae NOT DETECTED NOT DETECTED Final   Streptococcus pyogenes NOT DETECTED NOT DETECTED Final   Acinetobacter baumannii NOT DETECTED NOT DETECTED Final   Enterobacteriaceae species NOT DETECTED NOT DETECTED Final   Enterobacter cloacae complex NOT DETECTED NOT DETECTED Final   Escherichia coli NOT DETECTED NOT DETECTED Final   Klebsiella oxytoca NOT DETECTED NOT DETECTED Final   Klebsiella pneumoniae NOT DETECTED NOT DETECTED Final   Proteus species NOT DETECTED NOT DETECTED Final   Serratia marcescens NOT DETECTED NOT DETECTED Final   Haemophilus influenzae NOT DETECTED NOT DETECTED Final   Neisseria meningitidis NOT DETECTED NOT DETECTED Final   Pseudomonas aeruginosa NOT DETECTED NOT DETECTED Final   Candida albicans NOT DETECTED NOT DETECTED Final   Candida glabrata NOT DETECTED NOT DETECTED Final   Candida krusei NOT DETECTED NOT DETECTED Final   Candida parapsilosis NOT DETECTED NOT DETECTED Final   Candida  tropicalis NOT DETECTED NOT DETECTED Final    Comment: Performed at [facility], [place]. [address] [place], [place] [address]  SARS CORONAVIRUS 2 (TAT 6-24 HRS) Nasopharyngeal Nasopharyngeal Swab     Status: None   Collection Time: 06/03/19 12:35 AM   Specimen: Nasopharyngeal Swab  Result Value Ref Range Status   SARS Coronavirus 2 NEGATIVE NEGATIVE Final    Comment: (NOTE) SARS-CoV-2 target nucleic acids are NOT DETECTED. The SARS-CoV-2 RNA is generally detectable in upper and lower respiratory specimens during the acute phase of infection. Negative results do not preclude SARS-CoV-2 infection, do not rule out co-infections with other pathogens, and should not be used as the sole basis for treatment or other patient management decisions. Negative results must be combined with clinical observations, patient history, and epidemiological information. The expected result is Negative. Fact Sheet for Patients: SugarRoll.be Fact Sheet for Healthcare Providers: https://www.woods-.com/ This test is not yet approved or cleared by the [place] FDA and  has been authorized for detection and/or diagnosis of SARS-CoV-2 by FDA under an Emergency Use Authorization (EUA). This EUA will remain  in effect (meaning this test can be used) for the duration of the COVID-19 declaration under Section 56 4(b)(1) of the Act, 21 U.S.C. section 360bbb-3(b)(1), unless the authorization is terminated or revoked sooner. Performed at [facility], [place] [address], [place], [place] [address]   Expectorated sputum assessment w rflx to resp cult     Status: None   Collection Time: 06/03/19  4:41 AM   Specimen: Expectorated Sputum  Result Value Ref Range Status   Specimen Description EXPECTORATED SPUTUM  Final   Special Requests NONE  Final   Sputum evaluation   Final    Sputum specimen not acceptable for testing.  Please recollect.   NOFIFIED [facility]  AT [id number] ON 06/03/19 BY A,MOHAMED Performed at [facility], [place] [address] [place], [place] [address]    Report Status 06/03/2019 FINAL  Final  Respiratory Panel by PCR     Status: None   Collection Time: 06/03/19 10:12 AM   Specimen: Nasopharyngeal Swab; Respiratory  Result Value Ref Range Status   Adenovirus NOT DETECTED NOT DETECTED Final   Coronavirus 229E NOT DETECTED NOT DETECTED Final    Comment: (NOTE) The Coronavirus on the Respiratory Panel, DOES NOT test for the novel  Coronavirus (2019 nCoV)    Coronavirus HKU1 NOT DETECTED NOT DETECTED Final   Coronavirus NL63 NOT DETECTED NOT DETECTED Final   Coronavirus OC43 NOT DETECTED NOT DETECTED Final   Metapneumovirus NOT DETECTED NOT DETECTED Final   Rhinovirus / Enterovirus NOT DETECTED NOT DETECTED Final   Influenza A NOT DETECTED NOT DETECTED Final   Influenza B NOT DETECTED NOT DETECTED Final   Parainfluenza Virus 1 NOT DETECTED NOT DETECTED Final   Parainfluenza Virus 2 NOT DETECTED NOT DETECTED Final   Parainfluenza Virus 3 NOT DETECTED NOT DETECTED Final   Parainfluenza Virus 4 NOT DETECTED NOT DETECTED Final   Respiratory Syncytial Virus NOT DETECTED NOT DETECTED Final   Bordetella pertussis NOT DETECTED NOT DETECTED Final   Chlamydophila pneumoniae NOT DETECTED NOT DETECTED Final   Mycoplasma pneumoniae NOT DETECTED NOT DETECTED Final    Comment: Performed at [facility], [place]. [address] [place], [place] [address]  Culture, respiratory     Status: None (Preliminary result)   Collection Time: 06/03/19  4:40 PM   Specimen: SPU  Result Value Ref Range Status   Specimen Description   Final    SPUTUM Performed at [facility] [address], [place], [place] [address]    Special Requests   Final    NONE Performed at [facility], [place] [address] [place], [place] [address]    Gram Stain   Final    RARE WBC PRESENT, PREDOMINANTLY PMN RARE GRAM POSITIVE  COCCI IN PAIRS RARE BUDDING YEAST SEEN    Culture   Final    FEW YEAST IDENTIFICATION TO FOLLOW Performed at [facility], [place] [address] [place], [place] [address]    Report Status PENDING  Incomplete  MRSA PCR Screening     Status: None   Collection Time: 06/03/19  8:11 PM   Specimen: Nasopharyngeal  Result Value Ref Range Status   MRSA by PCR NEGATIVE NEGATIVE Final    Comment:        The GeneXpert MRSA Assay (FDA approved for NASAL specimens only), is one component of a comprehensive MRSA colonization surveillance program. It is not intended to diagnose MRSA infection nor to guide or monitor treatment for MRSA infections. Performed at [facility], [place] [address], [place], [place] [address]   Culture, blood (Routine X 2) w Reflex to ID Panel     Status: None (Preliminary result)   Collection Time: 06/05/19 10:43 AM   Specimen: BLOOD RIGHT HAND  Result Value Ref Range Status   Specimen Description   Final    BLOOD RIGHT HAND Performed at [facility] [address] [place], [place] [address]    Special Requests   Final    BOTTLES DRAWN AEROBIC AND ANAEROBIC Blood Culture adequate volume Performed at [facility] [address], [place],  [address]  Culture   Final    NO GROWTH < 24 HOURS Performed at [facility], [place] [address] [place], [place] [address]    Report Status PENDING  Incomplete  Culture, blood (Routine X 2) w Reflex to ID Panel     Status: None (Preliminary result)   Collection Time: 06/05/19 10:43 AM   Specimen: BLOOD LEFT HAND  Result Value Ref Range Status   Specimen Description   Final    BLOOD LEFT HAND Performed at [facility] [address] [place], [place] [address]    Special Requests   Final    BOTTLES DRAWN AEROBIC AND ANAEROBIC Blood Culture adequate volume Performed at [facility] [address]  [place], [place] [address]    Culture   Final    NO GROWTH < 24 HOURS Performed at [facility] [address] [place],  [address]    Report Status PENDING  Incomplete         Radiology Studies: DG Chest 1 View  Result Date: 06/06/2019 CLINICAL DATA:  Fever, shortness of breath, cough EXAM: CHEST  1 VIEW COMPARISON:  06/02/2019 FINDINGS: Interval worsening of diffuse bilateral interstitial airspace opacity. Unchanged cardiomegaly. Postoperative findings of posterior left chest wall resection. IMPRESSION: Interval worsening of diffuse bilateral interstitial airspace opacity, consistent with worsening edema and/or infection. Electronically Signed   By: Eddie Candle M.D.   On: 06/06/2019 09:56   CT CHEST W CONTRAST  Result Date: 06/06/2019 CLINICAL DATA:  59 year old male with history of hypoxemia. EXAM: CT CHEST WITH CONTRAST TECHNIQUE: Multidetector CT imaging of the chest was performed during intravenous contrast administration. CONTRAST:  71mL OMNIPAQUE IOHEXOL 300 MG/ML  SOLN COMPARISON:  Chest CTA 10/08/2018. FINDINGS: Cardiovascular: Heart size is normal. There is no significant pericardial fluid, thickening or pericardial calcification. There is aortic atherosclerosis, as well as atherosclerosis of the great vessels of the mediastinum and the coronary arteries, including calcified atherosclerotic plaque in the left main, left anterior descending, left circumflex and right coronary arteries. Mediastinum/Nodes: Prominent but nonenlarged mediastinal and hilar lymph nodes, measuring up to 1.1 cm in short axis in the prevascular nodal station, nonspecific. Esophagus is unremarkable in appearance. No axillary lymphadenopathy. Lungs/Pleura: Status post left thoracotomy for left lower lobectomy. Compensatory hyperexpansion of the left upper lobe. Widespread areas of ground-glass attenuation and septal thickening are noted throughout the lungs bilaterally, most evident throughout the mid to  upper lungs. Several areas demonstrate relative sparing of the subpleural lung. No pleural effusions. No definite suspicious appearing pulmonary nodules or masses. Upper Abdomen: Aortic atherosclerosis.  Status post cholecystectomy. Musculoskeletal: Postoperative changes of left-sided thoracotomy and chest wall resection. New posterior rod and screw fixation device starting at T8 and extending below the lower margin of the images, traversing chronic compression fractures of T10 and T11 which both demonstrate greater than 90% loss of anterior vertebral body height resultant in an acute kyphotic deformity at the level of T10/T11. There are no aggressive appearing lytic or blastic lesions noted in the visualized portions of the skeleton. IMPRESSION: 1. Widespread areas of ground-glass attenuation and septal thickening most evident throughout the mid to upper lungs, with some areas of subpleural sparing. Findings are nonspecific, favored to be of infectious or inflammatory etiology. 2. Aortic atherosclerosis, in addition to left main and 3 vessel coronary artery disease. Please note that although the presence of coronary artery calcium documents the presence of coronary artery disease, the severity of this disease and any  potential stenosis cannot be assessed on this non-gated CT examination. Assessment for potential risk factor modification, dietary therapy or pharmacologic therapy may be warranted, if clinically indicated. 3. Postoperative changes, as above. Aortic Atherosclerosis (ICD10-I70.0). Electronically Signed   By: Vinnie Langton M.D.   On: 06/06/2019 10:49   MR THORACIC SPINE WO CONTRAST  Result Date: 06/05/2019 CLINICAL DATA:  Mid back pain. Febrile. Prior fusion from T8-L2. History of vertebral osteomyelitis. EXAM: MRI THORACIC AND LUMBAR SPINE WITHOUT CONTRAST TECHNIQUE: Multiplanar and multiecho pulse sequences of the thoracic and lumbar spine were obtained without intravenous contrast. The patient  refused to continue because he got hot. He said he would return at another time for contrast. COMPARISON:  Chest x-ray dated 06/02/2019 and thoracic radiographs dated 05/23/2019 and thoracic MRI dated 09/30/2018 FINDINGS: MRI THORACIC SPINE FINDINGS Alignment: Accentuated lower thoracic kyphosis secondary to previous bone destruction at T10-11. Vertebrae: Hardware obscures some detail from T8 distally. No visible discitis or osteomyelitis or other significant acute bone abnormality. Cord: The visualized portion of the thoracic spinal cord appears normal. Hardware obscures the spinal cord at T10 extending distally. Paraspinal and other soft tissues: There is an abnormal appearance of both lungs, left worse than right. Recent chest x-ray of 06/02/2019 did not demonstrate significant abnormalities. This appearance may be artifactual. Disc levels: There are tiny disc bulges at T2-3 and T3-4 without neural impingement. The discs from C7-T1 through T6-7 are otherwise normal. No spinal or foraminal stenoses. T7-8: Tiny disc bulge to the right of midline with no neural impingement. T8-9: Negative. T9-10: Negative. T10-11: The details of the T10 and T11 vertebral bodies is obscured by hardware. The appearance suggests that those vertebral bodies have fused with Ace secondary anterior wedge deformity due to the previous osteomyelitis. T11-12: No disc bulging or protrusion. T12-L1: No visible abnormality. MRI LUMBAR SPINE FINDINGS Segmentation:  Standard. Alignment:  2 mm retrolisthesis of L4 on L5. Vertebrae: Pedicle screws extend from T8-L2. No acute abnormality of the lumbar spine. Conus medullaris and cauda equina: Conus extends to the L1-2 level. Conus and cauda equina appear normal. Paraspinal and other soft tissues: Negative. Disc levels: T12-L1 and L1-2: Normal disc. Pedicle screws. L2-3: Small broad-based disc bulge without neural impingement. No facet arthritis. Pedicle screws in L2. L3-4: Small broad-based disc  bulge asymmetric into the left neural foramen. Moderate bilateral facet arthritis with ligamentum flavum hypertrophy creating mild spinal stenosis with slight left foraminal stenosis. However, the left L3 nerve exits without impingement. Nearing of both lateral recesses. L4-5: Small disc bulge slightly asymmetric to the right. Moderate hypertrophy and arthritis of the right facet joint. No neural impingement. L5-S1: Marked disc space narrowing with degenerative changes of the vertebral endplates. Small broad-based disc protrusion with accompanying osteophytes extending into both neural foramina without severe foraminal stenosis. No focal neural impingement. Minimal degenerative changes of the right facet joint. IMPRESSION: MR THORACIC SPINE IMPRESSION No discrete osteomyelitis or discitis in the thoracic spine. Detail is limited at the level of the previous discitis and osteomyelitis at T10-11. 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 The thoracic spine otherwise demonstrates no significant abnormalities. MR LUMBAR SPINE IMPRESSION 1. 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 2. Moderate right facet arthritis at L4-5. Electronically Signed   By: Lorriane Shire M.D.   On: 06/05/2019 14:57   MR LUMBAR SPINE WO CONTRAST  Result Date: 06/05/2019 CLINICAL DATA:  Mid back pain. Febrile. Prior fusion from  T8-L2. History of vertebral osteomyelitis. EXAM: MRI THORACIC AND LUMBAR SPINE WITHOUT CONTRAST TECHNIQUE: Multiplanar and multiecho pulse sequences of the thoracic and lumbar spine were obtained without intravenous contrast. The patient refused to continue because he got hot. He said he would return at another time for contrast. COMPARISON:  Chest x-ray dated 06/02/2019 and thoracic radiographs dated 05/23/2019 and thoracic MRI dated 09/30/2018 FINDINGS: MRI THORACIC SPINE FINDINGS Alignment: Accentuated lower thoracic kyphosis secondary to  previous bone destruction at T10-11. Vertebrae: Hardware obscures some detail from T8 distally. No visible discitis or osteomyelitis or other significant acute bone abnormality. Cord: The visualized portion of the thoracic spinal cord appears normal. Hardware obscures the spinal cord at T10 extending distally. Paraspinal and other soft tissues: There is an abnormal appearance of both lungs, left worse than right. Recent chest x-ray of 06/02/2019 did not demonstrate significant abnormalities. This appearance may be artifactual. Disc levels: There are tiny disc bulges at T2-3 and T3-4 without neural impingement. The discs from C7-T1 through T6-7 are otherwise normal. No spinal or foraminal stenoses. T7-8: Tiny disc bulge to the right of midline with no neural impingement. T8-9: Negative. T9-10: Negative. T10-11: The details of the T10 and T11 vertebral bodies is obscured by hardware. The appearance suggests that those vertebral bodies have fused with Ace secondary anterior wedge deformity due to the previous osteomyelitis. T11-12: No disc bulging or protrusion. T12-L1: No visible abnormality. MRI LUMBAR SPINE FINDINGS Segmentation:  Standard. Alignment:  2 mm retrolisthesis of L4 on L5. Vertebrae: Pedicle screws extend from T8-L2. No acute abnormality of the lumbar spine. Conus medullaris and cauda equina: Conus extends to the L1-2 level. Conus and cauda equina appear normal. Paraspinal and other soft tissues: Negative. Disc levels: T12-L1 and L1-2: Normal disc. Pedicle screws. L2-3: Small broad-based disc bulge without neural impingement. No facet arthritis. Pedicle screws in L2. L3-4: Small broad-based disc bulge asymmetric into the left neural foramen. Moderate bilateral facet arthritis with ligamentum flavum hypertrophy creating mild spinal stenosis with slight left foraminal stenosis. However, the left L3 nerve exits without impingement. Nearing of both lateral recesses. L4-5: Small disc bulge slightly asymmetric  to the right. Moderate hypertrophy and arthritis of the right facet joint. No neural impingement. L5-S1: Marked disc space narrowing with degenerative changes of the vertebral endplates. Small broad-based disc protrusion with accompanying osteophytes extending into both neural foramina without severe foraminal stenosis. No focal neural impingement. Minimal degenerative changes of the right facet joint. IMPRESSION: MR THORACIC SPINE IMPRESSION No discrete osteomyelitis or discitis in the thoracic spine. Detail is limited at the level of the previous discitis and osteomyelitis at T10-11. 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 The thoracic spine otherwise demonstrates no significant abnormalities. MR LUMBAR SPINE IMPRESSION 1. 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 2. Moderate right facet arthritis at L4-5. Electronically Signed   By: Lorriane Shire M.D.   On: 06/05/2019 14:57   ECHOCARDIOGRAM COMPLETE  Result Date: 06/04/2019   ECHOCARDIOGRAM REPORT   Patient Name:   Howard Fox Feild Date of Exam: 06/04/2019 Medical Rec #:  [id number]      Height:       72.0 in Accession #:    [id number]     Weight:       242.0 lb Date of Birth:  1961/04/25      BSA:          2.31 m Patient Age:    20  years       BP:           132/75 mmHg Patient Gender: M              HR:           95 bpm. Exam Location:  Inpatient Procedure: 2D Echo, Cardiac Doppler and Color Doppler Indications:    Bacteremia  History:        Patient has prior history of Echocardiogram examinations, most                 recent 10/10/2018. Signs/Symptoms:Bacteremia; Risk                 Factors:Hypertension, Dyslipidemia and Diabetes. Hep. C, IVDU,                 MRSA, T-spine discitis.  Sonographer:    Dustin Flock Referring Phys: [place]  1. Left ventricular ejection fraction, by visual estimation, is 55 to 60%. The left ventricle has normal function.  There is no left ventricular hypertrophy.  2. Left ventricular diastolic parameters are indeterminate.  3. The left ventricle has no regional wall motion abnormalities.  4. Global right ventricle has normal systolic function.The right ventricular size is normal. No increase in right ventricular wall thickness.  5. Left atrial size was normal.  6. Right atrial size was normal.  7. The mitral valve is normal in structure. No evidence of mitral valve regurgitation. No evidence of mitral stenosis.  8. The tricuspid valve is normal in structure.  9. The aortic valve is normal in structure. Aortic valve regurgitation is not visualized. No evidence of aortic valve sclerosis or stenosis. 10. The pulmonic valve was normal in structure. Pulmonic valve regurgitation is not visualized. 11. The inferior vena cava is normal in size with greater than 50% respiratory variability, suggesting right atrial pressure of 3 mmHg. FINDINGS  Left Ventricle: Left ventricular ejection fraction, by visual estimation, is 55 to 60%. The left ventricle has normal function. The left ventricle has no regional wall motion abnormalities. There is no left ventricular hypertrophy. Left ventricular diastolic parameters are indeterminate. Indeterminate filling pressures. Right Ventricle: The right ventricular size is normal. No increase in right ventricular wall thickness. Global RV systolic function is has normal systolic function. Left Atrium: Left atrial size was normal in size. Right Atrium: Right atrial size was normal in size Pericardium: There is no evidence of pericardial effusion. Mitral Valve: The mitral valve is normal in structure. No evidence of mitral valve regurgitation. No evidence of mitral valve stenosis by observation. Tricuspid Valve: The tricuspid valve is normal in structure. Tricuspid valve regurgitation is not demonstrated. Aortic Valve: The aortic valve is normal in structure. Aortic valve regurgitation is not visualized. The  aortic valve is structurally normal, with no evidence of sclerosis or stenosis. Pulmonic Valve: The pulmonic valve was normal in structure. Pulmonic valve regurgitation is not visualized. Pulmonic regurgitation is not visualized. Aorta: The aortic root, ascending aorta and aortic arch are all structurally normal, with no evidence of dilitation or obstruction. Venous: The inferior vena cava is normal in size with greater than 50% respiratory variability, suggesting right atrial pressure of 3 mmHg. IAS/Shunts: No atrial level shunt detected by color flow Doppler. There is no evidence of a patent foramen ovale. No ventricular septal defect is seen or detected. There is no evidence of an atrial septal defect. Additional Comments: No vegetations are seen. Consider TEE if the suspicion for endocarditis  is high.  LEFT VENTRICLE PLAX 2D LVIDd:         5.21 cm  Diastology LVIDs:         3.38 cm  LV e' lateral:   7.94 cm/s LV PW:         1.09 cm  LV E/e' lateral: 8.6 LV IVS:        1.13 cm  LV e' medial:    5.22 cm/s LVOT diam:     2.50 cm  LV E/e' medial:  13.1 LV SV:         83 ml LV SV Index:   34.81 LVOT Area:     4.91 cm  RIGHT VENTRICLE RV Basal diam:  2.69 cm RV S prime:     13.20 cm/s TAPSE (M-mode): 2.9 cm LEFT ATRIUM             Index       RIGHT ATRIUM           Index LA diam:        3.50 cm 1.51 cm/m  RA Area:     14.20 cm LA Vol (A2C):   48.1 ml 20.82 ml/m RA Volume:   40.60 ml  17.57 ml/m LA Vol (A4C):   51.2 ml 22.16 ml/m LA Biplane Vol: 51.8 ml 22.42 ml/m  AORTIC VALVE LVOT Vmax:   99.80 cm/s LVOT Vmean:  65.700 cm/s LVOT VTI:    0.206 m  AORTA Ao Root diam: 3.70 cm MITRAL VALVE MV Area (PHT): 5.66 cm             SHUNTS MV PHT:        38.86 msec           Systemic VTI:  0.21 m MV Decel Time: 134 msec             Systemic Diam: 2.50 cm MV E velocity: 68.40 cm/s 103 cm/s MV A velocity: 65.50 cm/s 70.3 cm/s MV E/A ratio:  1.04       1.5  Mihai Croitoru MD Electronically signed by Sanda Klein MD  Signature Date/Time: 06/04/2019/6:09:26 PM    Final    [place] EKG SITE RITE  Result Date: 06/06/2019 If Site Rite image not attached, placement could not be confirmed due to current cardiac rhythm.       Scheduled Meds: . amitriptyline  150 mg Oral QHS  . aspirin EC  325 mg Oral Daily  . citalopram  20 mg Oral Q lunch  . enoxaparin (LOVENOX) injection  40 mg Subcutaneous Q24H  . furosemide  20 mg Intravenous BID  . gabapentin  300 mg Oral TID  . ibuprofen  800 mg Oral QPC lunch  . ipratropium-albuterol  3 mL Nebulization QID  . polyethylene glycol  17 g Oral Daily  . potassium chloride  10 mEq Oral BID  . rosuvastatin  40 mg Oral Daily  . sodium chloride (PF)      . cholecalciferol  5,000 Units Oral Daily   Continuous Infusions: . sodium chloride 50 mL/hr at 06/06/19 0809  . vancomycin Stopped (06/06/19 0809)     LOS: 3 days     Georgette Shell, MD [facility]  If 7PM-7AM, please contact night-coverage [email or web address] Password [facility] 06/06/2019, 1:04 PM

## 2019-06-06 NOTE — Progress Notes (Signed)
Discussed O2 requirement with Dr. Zigmund Daniel of hospital medicine. The patient is on 6L with saturations ~90%. He has significant desaturations into the 80s on RA. I expressed my concerns of proceeding with TEE at this time. We will plan to hold until his respiratory status is optimized and requirements are down.   [place] T. Audie Box, [place]  [address], [place], [place] [address] [phone]  8:54 AM

## 2019-06-06 NOTE — Progress Notes (Signed)
Per Dr Audie Box the pt's TEE is cancelled today due to pt's current oxygenation demands. Pamala Hurry, RN made aware of cancellation and [facility] transport also cancelled. Jobe Igo, RN

## 2019-06-07 LAB — CULTURE, RESPIRATORY W GRAM STAIN

## 2019-06-07 LAB — CREATININE, SERUM
Creatinine, Ser: 0.7 mg/dL (ref 0.61–1.24)
GFR calc Af Amer: >60 mL/min (ref >60)
GFR calc non Af Amer: >60 mL/min (ref >60)

## 2019-06-07 MED ORDER — CHLORHEXIDINE GLUCONATE CLOTH 2 % EX PADS
6.0000 | MEDICATED_PAD | Freq: Every day | CUTANEOUS | Status: DC
  Administered 2019-06-07 – 2019-06-12 (×6): 6 via TOPICAL

## 2019-06-07 MED ORDER — SODIUM CHLORIDE 0.9% FLUSH
10.0000 mL | Freq: Two times a day (BID) | INTRAVENOUS | Status: DC
  Administered 2019-06-07 – 2019-06-11 (×5): 10 mL

## 2019-06-07 MED ORDER — METHYLPREDNISOLONE SODIUM SUCC 125 MG IJ SOLR
60.0000 mg | INTRAMUSCULAR | Status: AC
  Administered 2019-06-07: 08:00:00 60 mg via INTRAVENOUS
  Filled 2019-06-07: qty 2

## 2019-06-07 MED ORDER — ALPRAZOLAM 0.5 MG PO TABS
0.5000 mg | ORAL_TABLET | Freq: Three times a day (TID) | ORAL | Status: DC | PRN
  Administered 2019-06-07 – 2019-06-12 (×12): 0.5 mg via ORAL
  Filled 2019-06-07 (×12): qty 1

## 2019-06-07 MED ORDER — SODIUM CHLORIDE 0.9% FLUSH: 10.0000 mL | INTRAVENOUS | Status: DC | PRN

## 2019-06-07 MED ORDER — METHYLPREDNISOLONE SODIUM SUCC 125 MG IJ SOLR
60.0000 mg | Freq: Three times a day (TID) | INTRAMUSCULAR | Status: DC
  Administered 2019-06-07 – 2019-06-11 (×12): 60 mg via INTRAVENOUS
  Filled 2019-06-07 (×12): qty 2

## 2019-06-07 NOTE — Progress Notes (Signed)
PROGRESS NOTE    Howard Fox  [id number] DOB: [birth year] DOA: 06/02/2019 PCP: Unk Pinto, MD  Brief Narrative: 58 year old male with history of HTN, HLD, diabetes, chronic pain syndrome, remote history of IVDU, history of vertebral osteomyelitis on chronic suppressive doxycycline, depression, hep C status post recent who came to the ED for persistent and worsening shortness of breath and associated fever&chills.Symptoms have been present for the past couple of days, progressive, and came to the hospital. His symptoms remind him of his pneumonia from last year. In the ED he was febrile, tachypneic, hypoxic requiring supplemental oxygen. COVID-19 was negative. He was admitted to the hospital and placed on broad-spectrum antibiotics for Pneumonia and after admission his blood cultures showed MRSA bacteremia.  06/05/2019-he is resting in bed anxious to go home.Saturation dropped to 77% overnight when he took off his oxygen accidentally in his sleep. Currently he is on 6 L of oxygen to keep saturation above 92%.  06/07/2019 patient's oxygen requirement went up to 15 L overnight.   Assessment & Plan:   Principal Problem:   Community acquired pneumonia Active Problems:   Bipolar disorder ([place])   Essential hypertension   Hyperlipidemia, mixed   Vitamin D deficiency   MRSA bacteremia   Vertebral osteomyelitis (HCC)   Fever   Dyspnea   Chronic pain syndrome   Pneumonia #1 acute hypoxic respiratory failure secondary to pneumonia versus edema and COPD exacerbation.  Patient smoked for over 40 years. Patient was initially treated with Rocephin azithromycin and Vanco. He improved with 1 dose of Solu-Medrol.  I will place him on Solu-Medrol every 8.  60 mg. Chest CT with groundglass opacities edema versus inflammation versus infection. ID has been consulted and narrowed antibiotics down to vancomycin. Lasix 20 mg twice a day.  Continue. TEE was canceled for today as he is not  stable and remains hypoxic.  Echo shows normal ejection fraction.Left ventricular ejection fraction, by visual estimation, is 55 to 60%. The left ventricle has normal function. There is no left ventricular hypertrophy. Left ventricular diastolic parameters are indeterminate. The left ventricle has no regional wall motion abnormalities. Global right ventricle has normal systolic function.The right ventricular size is normal. No increase in right ventricular wall thickness. Left atrial size was normal. Right atrial size was normal. The mitral valve is normal in structure. No evidence of mitral valve regurgitation. No evidence of mitral stenosis. The tricuspid valve is normal in structure. The aortic valve is normal in structure. Aortic valve regurgitation is not visualized. No evidence of aortic valve sclerosis or stenosis. The pulmonic valve was normal in structure. Pulmonic valve regurgitation is not visualized. The inferior vena cava is normal in size with greater than 50% respiratory variability, suggesting right atrial pressure of 3 mmHg.  #2 MRSA bacteremia with history of T-spine discitis-patient stopped doxy as an outpatient. Now on IV Vanco per ID. MRI of the thoracolumbar spine shows no evidence of osteomyelitis.  PICC line placed today.  #3 history of hepatitis C patient received treatment and tested negative.  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   Weight as of this encounter: 109.8 kg.  DVT prophylaxis:Lovenox Code Status:Full code  family Communication:Discussed with patient Disposition Planpatient is being treated with vancomycin  for MRSA bacteremia still with ongoing work-up still with leukocytosis and significant  hypoxia and fever Consultants:  ID, cardiology  Procedures:None  Antimicrobials:Vancomycin Subjective: His oxygen requirement went up to 15 L overnight.  He was given a stat dose of Solu-Medrol with improvement in oxygen saturation he is now down to 10 L over 92%.  Objective: Vitals:   06/07/19 0758 06/07/19 1149 06/07/19 1154 06/07/19 1405  BP:  138/71  133/79  Pulse:  98  91  Resp:  (!) 22  (!) 24  Temp:    98.3 F (36.8 C)  TempSrc:    Oral  SpO2: 96% 91% 90% 92%  Weight:      Height:        Intake/Output Summary (Last 24 hours) at 06/07/2019 1504 Last data filed at 06/07/2019 1406 Gross per 24 hour  Intake 869 ml  Output 550 ml  Net 319 ml   Filed Weights   06/02/19 2232  Weight: 109.8 kg    Examination:  General exam: Appears calm and comfortable  Respiratory system: Diffuse wheezing bilaterally to auscultation. Respiratory effort normal. Cardiovascular system: S1 & S2 heard, RRR. No JVD, murmurs, rubs, gallops or clicks. No pedal edema. Gastrointestinal system: Abdomen is nondistended, soft and nontender. No organomegaly or masses felt. Normal bowel sounds heard. Central nervous system: Alert and oriented. No focal neurological deficits. Extremities: Symmetric 5 x 5 power. Skin: No rashes, lesions or ulcers Psychiatry: Judgement and insight appear normal. Mood & affect appropriate.     Data Reviewed: I have personally reviewed following labs and imaging studies  CBC: Recent Labs  Lab 06/02/19 2235 06/03/19 0212 06/03/19 0613 06/05/19 0616 06/06/19 0944  WBC 9.9 9.5 10.3 14.3* 18.9*  NEUTROABS 8.5*  --  8.5*  --   --   HGB 12.8* 11.9* 12.7* 12.2* 12.4*  HCT 39.4 37.6* 39.1 38.2* 38.0*  MCV 88.9 90.0 89.1 90.3 88.2  PLT 348 [phone] [id number]   Basic Metabolic Panel: Recent Labs  Lab 06/02/19 2235 06/03/19 0212 06/03/19 0613 06/04/19 0519 06/05/19 0616 06/06/19 0032 06/07/19 0536  NA 134*  --  136  --  135  --   --   K 3.7  --  3.5  --   4.4  --   --   CL 99  --  99  --  100  --   --   CO2 24  --  23  --  23  --   --   GLUCOSE 131*  --  147*  --  126*  --   --   BUN 9  --  8  --  15  --   --   CREATININE 0.82   < > 0.76 0.82 0.86 0.78 0.70  CALCIUM 9.1  --  8.7*  --  8.9  --   --    < > = values in this interval not displayed.   GFR: Estimated Creatinine Clearance: 130.4 mL/min (by C-G formula based on SCr of 0.7 mg/dL). Liver Function Tests: Recent Labs  Lab 06/02/19 2235 06/05/19 0616  AST 28 27  ALT 21 24  ALKPHOS 99 103  BILITOT 0.3 0.4  PROT 8.0 7.3  ALBUMIN 3.1* 2.8*   No results for input(s): LIPASE, AMYLASE in the last 168 hours. No results for input(s): AMMONIA in the last 168 hours. Coagulation Profile: No results for input(s): INR, PROTIME in the last 168 hours. Cardiac Enzymes: No results for input(s): CKTOTAL, CKMB, CKMBINDEX, TROPONINI in  the last 168 hours. BNP (last 3 results) No results for input(s): PROBNP in the last 8760 hours. HbA1C: No results for input(s): HGBA1C in the last 72 hours. CBG: No results for input(s): GLUCAP in the last 168 hours. Lipid Profile: No results for input(s): CHOL, HDL, LDLCALC, TRIG, CHOLHDL, LDLDIRECT in the last 72 hours. Thyroid Function Tests: No results for input(s): TSH, T4TOTAL, FREET4, T3FREE, THYROIDAB in the last 72 hours. Anemia Panel: No results for input(s): VITAMINB12, FOLATE, FERRITIN, TIBC, IRON, RETICCTPCT in the last 72 hours. Sepsis Labs: Recent Labs  Lab 06/02/19 2235 06/03/19 0035 06/03/19 0212 06/03/19 0613 06/04/19 0519  PROCALCITON  --   --  0.14 0.16 <0.10  LATICACIDVEN 1.2 1.1  --   --   --     Recent Results (from the past 240 hour(s))  Blood culture (routine x 2)     Status: Abnormal   Collection Time: 06/02/19 10:45 PM   Specimen: BLOOD  Result Value Ref Range Status   Specimen Description   Final    BLOOD LEFT ANTECUBITAL Performed at [facility] [address], [place], [place] [address]      Special Requests   Final    BOTTLES DRAWN AEROBIC AND ANAEROBIC Blood Culture adequate volume   Culture  Setup Time   Final    GRAM POSITIVE COCCI IN CLUSTERS IN BOTH AEROBIC AND ANAEROBIC BOTTLES CRITICAL RESULT CALLED TO, READ BACK BY AND VERIFIED WITH: Shelda Jakes PHARMD 1640 06/03/19 A BROWNING    Culture (A)  Final    STAPHYLOCOCCUS AUREUS SUSCEPTIBILITIES PERFORMED ON PREVIOUS CULTURE WITHIN THE LAST 5 DAYS. Performed at [facility], [place] [address] [place], [place] [address]    Report Status 06/05/2019 FINAL  Final  Blood culture (routine x 2)     Status: Abnormal   Collection Time: 06/03/19 12:05 AM   Specimen: BLOOD  Result Value Ref Range Status   Specimen Description   Final    BLOOD RIGHT ANTECUBITAL Performed at [facility], [place] [address] [place], [place] [address]    Special Requests   Final    BOTTLES DRAWN AEROBIC AND ANAEROBIC Blood Culture adequate volume Performed at [facility] [address], [place], [place] [address]    Culture  Setup Time   Final    GRAM POSITIVE COCCI IN CLUSTERS IN BOTH AEROBIC AND ANAEROBIC BOTTLES CRITICAL RESULT CALLED TO, READ BACK BY AND VERIFIED WITH: J LEGGE PHARMD 2200 06/03/19 A BROWNING Performed at [facility], [place] [address] [place],  [address]    Culture METHICILLIN RESISTANT STAPHYLOCOCCUS AUREUS (A)  Final   Report Status 06/05/2019 FINAL  Final   Organism ID, Bacteria METHICILLIN RESISTANT STAPHYLOCOCCUS AUREUS  Final      Susceptibility   Methicillin resistant staphylococcus aureus - MIC*    CIPROFLOXACIN >=8 RESISTANT Resistant     ERYTHROMYCIN >=8 RESISTANT Resistant     GENTAMICIN <=0.5 SENSITIVE Sensitive     OXACILLIN >=4 RESISTANT Resistant     TETRACYCLINE <=1 SENSITIVE Sensitive     VANCOMYCIN 1 SENSITIVE Sensitive     TRIMETH/SULFA <=10 SENSITIVE Sensitive     CLINDAMYCIN <=0.25 SENSITIVE Sensitive     RIFAMPIN <=0.5 SENSITIVE Sensitive     Inducible  Clindamycin NEGATIVE Sensitive     * METHICILLIN RESISTANT STAPHYLOCOCCUS AUREUS  Blood Culture ID Panel (Reflexed)     Status: Abnormal   Collection Time: 06/03/19 12:05 AM  Result Value Ref  Range Status   Enterococcus species NOT DETECTED NOT DETECTED Final   Listeria monocytogenes NOT DETECTED NOT DETECTED Final   Staphylococcus species DETECTED (A) NOT DETECTED Final    Comment: CRITICAL RESULT CALLED TO, READ BACK BY AND VERIFIED WITH: J LEGGE PHARMD 2200 06/03/19 A BROWNING    Staphylococcus aureus (BCID) DETECTED (A) NOT DETECTED Final    Comment: Methicillin (oxacillin)-resistant Staphylococcus aureus (MRSA). MRSA is predictably resistant to beta-lactam antibiotics (except ceftaroline). Preferred therapy is vancomycin unless clinically contraindicated. Patient requires contact precautions if  hospitalized. CRITICAL RESULT CALLED TO, READ BACK BY AND VERIFIED WITH: J LEGGE PHARMD 2200 06/03/19 A BROWNING    Methicillin resistance DETECTED (A) NOT DETECTED Final    Comment: CRITICAL RESULT CALLED TO, READ BACK BY AND VERIFIED WITH: J LEGGE PHARMD 2200 06/03/19 A BROWNING    Streptococcus species NOT DETECTED NOT DETECTED Final   Streptococcus agalactiae NOT DETECTED NOT DETECTED Final   Streptococcus pneumoniae NOT DETECTED NOT DETECTED Final   Streptococcus pyogenes NOT DETECTED NOT DETECTED Final   Acinetobacter baumannii NOT DETECTED NOT DETECTED Final   Enterobacteriaceae species NOT DETECTED NOT DETECTED Final   Enterobacter cloacae complex NOT DETECTED NOT DETECTED Final   Escherichia coli NOT DETECTED NOT DETECTED Final   Klebsiella oxytoca NOT DETECTED NOT DETECTED Final   Klebsiella pneumoniae NOT DETECTED NOT DETECTED Final   Proteus species NOT DETECTED NOT DETECTED Final   Serratia marcescens NOT DETECTED NOT DETECTED Final   Haemophilus influenzae NOT DETECTED NOT DETECTED Final   Neisseria meningitidis NOT DETECTED NOT DETECTED Final   Pseudomonas aeruginosa NOT  DETECTED NOT DETECTED Final   Candida albicans NOT DETECTED NOT DETECTED Final   Candida glabrata NOT DETECTED NOT DETECTED Final   Candida krusei NOT DETECTED NOT DETECTED Final   Candida parapsilosis NOT DETECTED NOT DETECTED Final   Candida tropicalis NOT DETECTED NOT DETECTED Final    Comment: Performed at [facility], [place] [address], [place], [place] [address]  SARS CORONAVIRUS 2 (TAT 6-24 HRS) Nasopharyngeal Nasopharyngeal Swab     Status: None   Collection Time: 06/03/19 12:35 AM   Specimen: Nasopharyngeal Swab  Result Value Ref Range Status   SARS Coronavirus 2 NEGATIVE NEGATIVE Final    Comment: (NOTE) SARS-CoV-2 target nucleic acids are NOT DETECTED. The SARS-CoV-2 RNA is generally detectable in upper and lower respiratory specimens during the acute phase of infection. Negative results do not preclude SARS-CoV-2 infection, do not rule out co-infections with other pathogens, and should not be used as the sole basis for treatment or other patient management decisions. Negative results must be combined with clinical observations, patient history, and epidemiological information. The expected result is Negative. Fact Sheet for Patients: SugarRoll.be Fact Sheet for Healthcare Providers: https://www.woods-.com/ This test is not yet approved or cleared by the [place] FDA and  has been authorized for detection and/or diagnosis of SARS-CoV-2 by FDA under an Emergency Use Authorization (EUA). This EUA will remain  in effect (meaning this test can be used) for the duration of the COVID-19 declaration under Section 56 4(b)(1) of the Act, 21 U.S.C. section 360bbb-3(b)(1), unless the authorization is terminated or revoked sooner. Performed at [facility], [place] [address] [place], [place] [address]   Expectorated sputum assessment w rflx to resp cult     Status: None   Collection Time: 06/03/19  4:41 AM   Specimen:  Expectorated Sputum  Result Value Ref Range Status   Specimen Description EXPECTORATED SPUTUM  Final   Special Requests NONE  Final   Sputum evaluation   Final    Sputum specimen not acceptable for testing.  Please recollect.   NOFIFIED [facility] AT [id number] ON 06/03/19 BY A,MOHAMED Performed at [facility], [place] [address] [place], [place] [address]    Report Status 06/03/2019 FINAL  Final  Respiratory Panel by PCR     Status: None   Collection Time: 06/03/19 10:12 AM   Specimen: Nasopharyngeal Swab; Respiratory  Result Value Ref Range Status   Adenovirus NOT DETECTED NOT DETECTED Final   Coronavirus 229E NOT DETECTED NOT DETECTED Final    Comment: (NOTE) The Coronavirus on the Respiratory Panel, DOES NOT test for the novel  Coronavirus (2019 nCoV)    Coronavirus HKU1 NOT DETECTED NOT DETECTED Final   Coronavirus NL63 NOT DETECTED NOT DETECTED Final   Coronavirus OC43 NOT DETECTED NOT DETECTED Final   Metapneumovirus NOT DETECTED NOT DETECTED Final   Rhinovirus / Enterovirus NOT DETECTED NOT DETECTED Final   Influenza A NOT DETECTED NOT DETECTED Final   Influenza B NOT DETECTED NOT DETECTED Final   Parainfluenza Virus 1 NOT DETECTED NOT DETECTED Final   Parainfluenza Virus 2 NOT DETECTED NOT DETECTED Final   Parainfluenza Virus 3 NOT DETECTED NOT DETECTED Final   Parainfluenza Virus 4 NOT DETECTED NOT DETECTED Final   Respiratory Syncytial Virus NOT DETECTED NOT DETECTED Final   Bordetella pertussis NOT DETECTED NOT DETECTED Final   Chlamydophila pneumoniae NOT DETECTED NOT DETECTED Final   Mycoplasma pneumoniae NOT DETECTED NOT DETECTED Final    Comment: Performed at [facility], [place]. [address], [place], [place] [address]  Culture, respiratory     Status: None   Collection Time: 06/03/19  4:40 PM   Specimen: SPU  Result Value Ref Range Status   Specimen Description   Final    SPUTUM Performed at [place] [address]  [place], [place] [address]    Special Requests   Final    NONE Performed at [facility], [place] [address] [place], [place] [address]    Gram Stain   Final    RARE WBC PRESENT, PREDOMINANTLY PMN RARE GRAM POSITIVE COCCI IN PAIRS RARE BUDDING YEAST SEEN Performed at [facility], [place] [address] [place], [place] [address]    Culture   Final    FEW CANDIDA TROPICALIS RARE ENTEROCOCCUS FAECALIS    Report Status 06/07/2019 FINAL  Final   Organism ID, Bacteria ENTEROCOCCUS FAECALIS  Final      Susceptibility   Enterococcus faecalis - MIC*    AMPICILLIN <=2 SENSITIVE Sensitive     VANCOMYCIN 1 SENSITIVE Sensitive     GENTAMICIN SYNERGY SENSITIVE Sensitive     * RARE ENTEROCOCCUS FAECALIS  MRSA PCR Screening     Status: None   Collection Time: 06/03/19  8:11 PM   Specimen: Nasopharyngeal  Result Value Ref Range Status   MRSA by PCR NEGATIVE NEGATIVE Final    Comment:        The GeneXpert MRSA Assay (FDA approved for NASAL specimens only), is one component of a comprehensive MRSA colonization surveillance program. It is not intended to diagnose MRSA infection nor to guide or monitor treatment for MRSA infections. Performed at [facility], [place] [address] [place], [place] [address]   Culture, blood (Routine X 2) w Reflex to ID Panel     Status: None (Preliminary result)   Collection Time:  06/05/19 10:43 AM   Specimen: BLOOD RIGHT HAND  Result Value Ref Range Status   Specimen Description   Final    BLOOD RIGHT HAND Performed at [facility], [place] [address], [place], [place] [address]    Special Requests   Final    BOTTLES DRAWN AEROBIC AND ANAEROBIC Blood Culture adequate volume Performed at [place] [address], [place], [place] [address]    Culture   Final    NO GROWTH 1 DAY Performed at [facility], [place] [address], [place], [place] [address]    Report Status PENDING   Incomplete  Culture, blood (Routine X 2) w Reflex to ID Panel     Status: None (Preliminary result)   Collection Time: 06/05/19 10:43 AM   Specimen: BLOOD LEFT HAND  Result Value Ref Range Status   Specimen Description   Final    BLOOD LEFT HAND Performed at [facility] [address] [place], [place] [address]    Special Requests   Final    BOTTLES DRAWN AEROBIC AND ANAEROBIC Blood Culture adequate volume Performed at [facility] [address] [place], [place] [address]    Culture   Final    NO GROWTH 1 DAY Performed at [facility], [place] [address], [place], [place] [address]    Report Status PENDING  Incomplete         Radiology Studies: DG Chest 1 View  Result Date: 06/06/2019 CLINICAL DATA:  Fever, shortness of breath, cough EXAM: CHEST  1 VIEW COMPARISON:  06/02/2019 FINDINGS: Interval worsening of diffuse bilateral interstitial airspace opacity. Unchanged cardiomegaly. Postoperative findings of posterior left chest wall resection. IMPRESSION: Interval worsening of diffuse bilateral interstitial airspace opacity, consistent with worsening edema and/or infection. Electronically Signed   By: Eddie Candle M.D.   On: 06/06/2019 09:56   CT CHEST W CONTRAST  Result Date: 06/06/2019 CLINICAL DATA:  59 year old male with history of hypoxemia. EXAM: CT CHEST WITH CONTRAST TECHNIQUE: Multidetector CT imaging of the chest was performed during intravenous contrast administration. CONTRAST:  10mL OMNIPAQUE IOHEXOL 300 MG/ML  SOLN COMPARISON:  Chest CTA 10/08/2018. FINDINGS: Cardiovascular: Heart size is normal. There is no significant pericardial fluid, thickening or pericardial calcification. There is aortic atherosclerosis, as well as atherosclerosis of the great vessels of the mediastinum and the coronary arteries, including calcified atherosclerotic plaque in the left main, left anterior descending, left circumflex and right coronary  arteries. Mediastinum/Nodes: Prominent but nonenlarged mediastinal and hilar lymph nodes, measuring up to 1.1 cm in short axis in the prevascular nodal station, nonspecific. Esophagus is unremarkable in appearance. No axillary lymphadenopathy. Lungs/Pleura: Status post left thoracotomy for left lower lobectomy. Compensatory hyperexpansion of the left upper lobe. Widespread areas of ground-glass attenuation and septal thickening are noted throughout the lungs bilaterally, most evident throughout the mid to upper lungs. Several areas demonstrate relative sparing of the subpleural lung. No pleural effusions. No definite suspicious appearing pulmonary nodules or masses. Upper Abdomen: Aortic atherosclerosis.  Status post cholecystectomy. Musculoskeletal: Postoperative changes of left-sided thoracotomy and chest wall resection. New posterior rod and screw fixation device starting at T8 and extending below the lower margin of the images, traversing chronic compression fractures of T10 and T11 which both demonstrate greater than 90% loss of anterior vertebral body height resultant in an acute kyphotic deformity at the level of T10/T11. There are no aggressive appearing lytic or blastic lesions noted in the visualized portions of  the skeleton. IMPRESSION: 1. Widespread areas of ground-glass attenuation and septal thickening most evident throughout the mid to upper lungs, with some areas of subpleural sparing. Findings are nonspecific, favored to be of infectious or inflammatory etiology. 2. Aortic atherosclerosis, in addition to left main and 3 vessel coronary artery disease. Please note that although the presence of coronary artery calcium documents the presence of coronary artery disease, the severity of this disease and any potential stenosis cannot be assessed on this non-gated CT examination. Assessment for potential risk factor modification, dietary therapy or pharmacologic therapy may be warranted, if clinically  indicated. 3. Postoperative changes, as above. Aortic Atherosclerosis (ICD10-I70.0). Electronically Signed   By: Vinnie Langton M.D.   On: 06/06/2019 10:49   [place] EKG SITE RITE  Result Date: 06/06/2019 If Site Rite image not attached, placement could not be confirmed due to current cardiac rhythm.       Scheduled Meds: . amitriptyline  150 mg Oral QHS  . aspirin EC  325 mg Oral Daily  . Chlorhexidine Gluconate Cloth  6 each Topical Daily  . citalopram  20 mg Oral Q lunch  . furosemide  20 mg Intravenous BID  . gabapentin  300 mg Oral TID  . ipratropium-albuterol  3 mL Nebulization QID  . polyethylene glycol  17 g Oral Daily  . potassium chloride  10 mEq Oral BID  . rosuvastatin  40 mg Oral Daily  . sodium chloride flush  10-40 mL Intracatheter Q12H  . cholecalciferol  5,000 Units Oral Daily   Continuous Infusions: . Vancomycin 750 mg (06/07/19 0525)     LOS: 4 days      Georgette Shell, MD [facility]  If 7PM-7AM, please contact night-coverage [email or web address] Password [facility] 06/07/2019, 3:04 PM

## 2019-06-07 NOTE — Progress Notes (Signed)
Peripherally Inserted Central Catheter/Midline Placement  The IV Nurse has discussed with the patient and/or persons authorized to consent for the patient, the purpose of this procedure and the potential benefits and risks involved with this procedure.  The benefits include less needle sticks, lab draws from the catheter, and the patient may be discharged home with the catheter. Risks include, but not limited to, infection, bleeding, blood clot (thrombus formation), and puncture of an artery; nerve damage and irregular heartbeat and possibility to perform a PICC exchange if needed/ordered by physician.  Alternatives to this procedure were also discussed.  Bard Power PICC patient education guide, fact sheet on infection prevention and patient information card has been provided to patient /or left at bedside.    PICC/Midline Placement Documentation  PICC Single Lumen [id number] PICC Right Basilic 43 cm 0 cm (Active)  Indication for Insertion or Continuance of Line Home intravenous therapies (PICC only) 06/07/19 1200  Exposed Catheter (cm) 0 cm 06/07/19 1200  Site Assessment Clean;Dry;Intact 06/07/19 1200  Line Status Flushed;Blood return noted 06/07/19 1200  Dressing Type Transparent 06/07/19 1200  Dressing Status Clean;Dry;Intact;Antimicrobial disc in place 06/07/19 1200  Dressing Change Due 06/14/19 06/07/19 1200       Howard Fox 06/07/2019, 12:50 PM

## 2019-06-07 NOTE — Progress Notes (Signed)
Patient requested that we call his mother Philip Tisby. Called Mrs. Verda Cumins and talked w/ her about patient condition. I informed her that there is no mention of a possible discharge date. I informed her that the biggest thing right now is to bring patient oxygen demand down. I informed her that we are encouraging patient to rest as much as possible b/c when he gets up and walks he gets ShOB very easily. I encouraged her to encourage patient to rest as much as possible as well. Pt mother verbalized understanding.

## 2019-06-07 NOTE — Progress Notes (Signed)
INFECTIOUS DISEASE PROGRESS NOTE  ID: Howard Fox is a 59 y.o. male with  Principal Problem:   Community acquired pneumonia Active Problems:   Bipolar disorder ([place])   Essential hypertension   Hyperlipidemia, mixed   Vitamin D deficiency   MRSA bacteremia   Vertebral osteomyelitis (HCC)   Fever   Dyspnea   Chronic pain syndrome   Pneumonia  Subjective: Continued cough, doe  Abtx:  Anti-infectives (From admission, onward)   Start     Dose/Rate Route Frequency Ordered Stop   06/06/19 1430  vancomycin (VANCOCIN) IVPB 750 mg/150 ml premix     750 mg 150 mL/hr over 60 Minutes Intravenous Every 8 hours 06/06/19 1347     06/04/19 1000  vancomycin (VANCOREADY) IVPB 1250 mg/250 mL  Status:  Discontinued     1,250 mg 166.7 mL/hr over 90 Minutes Intravenous Every 12 hours 06/03/19 2214 06/06/19 1347   06/03/19 2300  vancomycin (VANCOREADY) IVPB 2000 mg/400 mL     2,000 mg 200 mL/hr over 120 Minutes Intravenous  Once 06/03/19 2214 06/04/19 0941   06/03/19 2200  cefTRIAXone (ROCEPHIN) 2 g in sodium chloride 0.9 % 100 mL IVPB  Status:  Discontinued    Note to Pharmacy: Please time according to dose given in ED   2 g 200 mL/hr over 30 Minutes Intravenous Every 24 hours 06/03/19 0037 06/04/19 1650   06/03/19 2200  azithromycin (ZITHROMAX) 500 mg in sodium chloride 0.9 % 250 mL IVPB  Status:  Discontinued    Note to Pharmacy: Please time according to dose given in ED   500 mg 250 mL/hr over 60 Minutes Intravenous Every 24 hours 06/03/19 0037 06/04/19 1650   06/03/19 0000  cefTRIAXone (ROCEPHIN) 2 g in sodium chloride 0.9 % 100 mL IVPB     2 g 200 mL/hr over 30 Minutes Intravenous  Once 06/02/19 2359 06/03/19 0110   06/03/19 0000  azithromycin (ZITHROMAX) 500 mg in sodium chloride 0.9 % 250 mL IVPB     500 mg 250 mL/hr over 60 Minutes Intravenous  Once 06/02/19 2359 06/03/19 0233      Medications:  Scheduled: . amitriptyline  150 mg Oral QHS  . aspirin EC  325 mg Oral Daily    . citalopram  20 mg Oral Q lunch  . furosemide  20 mg Intravenous BID  . gabapentin  300 mg Oral TID  . ipratropium-albuterol  3 mL Nebulization QID  . polyethylene glycol  17 g Oral Daily  . potassium chloride  10 mEq Oral BID  . rosuvastatin  40 mg Oral Daily  . cholecalciferol  5,000 Units Oral Daily    Objective: Vital signs in last 24 hours: Temp:  [98.3 F (36.8 C)-98.4 F (36.9 C)] 98.4 F (36.9 C) (01/15 0523) Pulse Rate:  [84-90] 90 (01/15 0523) Resp:  [14-24] 24 (01/15 0523) BP: (125-132)/(73-83) 132/83 (01/15 0523) SpO2:  [85 %-96 %] 96 % (01/15 0758)   General appearance: alert, cooperative and no distress Resp: rhonchi anterior - bilateral and tachypnea Cardio: regular rate and rhythm GI: normal findings: bowel sounds normal and soft, non-tender  Lab Results Recent Labs    06/05/19 0616 06/05/19 0616 06/06/19 0032 06/06/19 0944 06/07/19 0536  WBC 14.3*  --   --  18.9*  --   HGB 12.2*  --   --  12.4*  --   HCT 38.2*  --   --  38.0*  --   NA 135  --   --   --   --  K 4.4  --   --   --   --   CL 100  --   --   --   --   CO2 23  --   --   --   --   BUN 15  --   --   --   --   CREATININE 0.86   < > 0.78  --  0.70   < > = values in this interval not displayed.   Liver Panel Recent Labs    06/05/19 0616  PROT 7.3  ALBUMIN 2.8*  AST 27  ALT 24  ALKPHOS 103  BILITOT 0.4   Sedimentation Rate No results for input(s): ESRSEDRATE in the last 72 hours. C-Reactive Protein No results for input(s): CRP in the last 72 hours.  Microbiology: Recent Results (from the past 240 hour(s))  Blood culture (routine x 2)     Status: Abnormal   Collection Time: 06/02/19 10:45 PM   Specimen: BLOOD  Result Value Ref Range Status   Specimen Description   Final    BLOOD LEFT ANTECUBITAL Performed at [facility] [address] [place], [place] [address]    Special Requests   Final    BOTTLES DRAWN AEROBIC AND ANAEROBIC Blood Culture adequate  volume   Culture  Setup Time   Final    GRAM POSITIVE COCCI IN CLUSTERS IN BOTH AEROBIC AND ANAEROBIC BOTTLES CRITICAL RESULT CALLED TO, READ BACK BY AND VERIFIED WITH: Shelda Jakes PHARMD 1640 06/03/19 A BROWNING    Culture (A)  Final    STAPHYLOCOCCUS AUREUS SUSCEPTIBILITIES PERFORMED ON PREVIOUS CULTURE WITHIN THE LAST 5 DAYS. Performed at [facility], [place] [address], [place], [place] [address]    Report Status 06/05/2019 FINAL  Final  Blood culture (routine x 2)     Status: Abnormal   Collection Time: 06/03/19 12:05 AM   Specimen: BLOOD  Result Value Ref Range Status   Specimen Description   Final    BLOOD RIGHT ANTECUBITAL Performed at [facility], [place] [address] [place], [place] [address]    Special Requests   Final    BOTTLES DRAWN AEROBIC AND ANAEROBIC Blood Culture adequate volume Performed at [facility] [address] [place], [place] [address]    Culture  Setup Time   Final    GRAM POSITIVE COCCI IN CLUSTERS IN BOTH AEROBIC AND ANAEROBIC BOTTLES CRITICAL RESULT CALLED TO, READ BACK BY AND VERIFIED WITH: J LEGGE PHARMD 2200 06/03/19 A BROWNING Performed at [facility], [place] [address] [place], [place] [address]    Culture METHICILLIN RESISTANT STAPHYLOCOCCUS AUREUS (A)  Final   Report Status 06/05/2019 FINAL  Final   Organism ID, Bacteria METHICILLIN RESISTANT STAPHYLOCOCCUS AUREUS  Final      Susceptibility   Methicillin resistant staphylococcus aureus - MIC*    CIPROFLOXACIN >=8 RESISTANT Resistant     ERYTHROMYCIN >=8 RESISTANT Resistant     GENTAMICIN <=0.5 SENSITIVE Sensitive     OXACILLIN >=4 RESISTANT Resistant     TETRACYCLINE <=1 SENSITIVE Sensitive     VANCOMYCIN 1 SENSITIVE Sensitive     TRIMETH/SULFA <=10 SENSITIVE Sensitive     CLINDAMYCIN <=0.25 SENSITIVE Sensitive     RIFAMPIN <=0.5 SENSITIVE Sensitive     Inducible Clindamycin NEGATIVE Sensitive     * METHICILLIN RESISTANT STAPHYLOCOCCUS AUREUS  Blood  Culture ID Panel (Reflexed)     Status: Abnormal   Collection Time: 06/03/19 12:05 AM  Result Value Ref Range Status   Enterococcus species NOT DETECTED NOT DETECTED Final   Listeria monocytogenes NOT DETECTED NOT DETECTED Final   Staphylococcus species DETECTED (A) NOT DETECTED Final    Comment: CRITICAL RESULT CALLED TO, READ BACK BY AND VERIFIED WITH: J LEGGE PHARMD 2200 06/03/19 A BROWNING    Staphylococcus aureus (BCID) DETECTED (A) NOT DETECTED Final    Comment: Methicillin (oxacillin)-resistant Staphylococcus aureus (MRSA). MRSA is predictably resistant to beta-lactam antibiotics (except ceftaroline). Preferred therapy is vancomycin unless clinically contraindicated. Patient requires contact precautions if  hospitalized. CRITICAL RESULT CALLED TO, READ BACK BY AND VERIFIED WITH: J LEGGE PHARMD 2200 06/03/19 A BROWNING    Methicillin resistance DETECTED (A) NOT DETECTED Final    Comment: CRITICAL RESULT CALLED TO, READ BACK BY AND VERIFIED WITH: J LEGGE PHARMD 2200 06/03/19 A BROWNING    Streptococcus species NOT DETECTED NOT DETECTED Final   Streptococcus agalactiae NOT DETECTED NOT DETECTED Final   Streptococcus pneumoniae NOT DETECTED NOT DETECTED Final   Streptococcus pyogenes NOT DETECTED NOT DETECTED Final   Acinetobacter baumannii NOT DETECTED NOT DETECTED Final   Enterobacteriaceae species NOT DETECTED NOT DETECTED Final   Enterobacter cloacae complex NOT DETECTED NOT DETECTED Final   Escherichia coli NOT DETECTED NOT DETECTED Final   Klebsiella oxytoca NOT DETECTED NOT DETECTED Final   Klebsiella pneumoniae NOT DETECTED NOT DETECTED Final   Proteus species NOT DETECTED NOT DETECTED Final   Serratia marcescens NOT DETECTED NOT DETECTED Final   Haemophilus influenzae NOT DETECTED NOT DETECTED Final   Neisseria meningitidis NOT DETECTED NOT DETECTED Final   Pseudomonas aeruginosa NOT DETECTED NOT DETECTED Final   Candida albicans NOT DETECTED NOT DETECTED Final   Candida  glabrata NOT DETECTED NOT DETECTED Final   Candida krusei NOT DETECTED NOT DETECTED Final   Candida parapsilosis NOT DETECTED NOT DETECTED Final   Candida tropicalis NOT DETECTED NOT DETECTED Final    Comment: Performed at [facility], [place]. [address], [place], [place] [address]  SARS CORONAVIRUS 2 (TAT 6-24 HRS) Nasopharyngeal Nasopharyngeal Swab     Status: None   Collection Time: 06/03/19 12:35 AM   Specimen: Nasopharyngeal Swab  Result Value Ref Range Status   SARS Coronavirus 2 NEGATIVE NEGATIVE Final    Comment: (NOTE) SARS-CoV-2 target nucleic acids are NOT DETECTED. The SARS-CoV-2 RNA is generally detectable in upper and lower respiratory specimens during the acute phase of infection. Negative results do not preclude SARS-CoV-2 infection, do not rule out co-infections with other pathogens, and should not be used as the sole basis for treatment or other patient management decisions. Negative results must be combined with clinical observations, patient history, and epidemiological information. The expected result is Negative. Fact Sheet for Patients: SugarRoll.be Fact Sheet for Healthcare Providers: https://www.woods-mathews.com/ This test is not yet approved or cleared by the [place] FDA and  has been authorized for detection and/or diagnosis of SARS-CoV-2 by FDA under an Emergency Use Authorization (EUA). This EUA will remain  in effect (meaning this test can be used) for the duration of the COVID-19 declaration under Section 56 4(b)(1) of the Act, 21 U.S.C. section 360bbb-3(b)(1), unless the authorization is terminated or revoked sooner. Performed at [facility], [place] [address], [place], [place] [address]   Expectorated sputum assessment w rflx to resp cult     Status: None   Collection Time: 06/03/19  4:41 AM   Specimen: Expectorated Sputum  Result Value Ref Range Status   Specimen Description EXPECTORATED  SPUTUM  Final   Special Requests NONE  Final   Sputum evaluation   Final    Sputum specimen not acceptable for testing.  Please recollect.   NOFIFIED [facility] AT [id number] ON 06/03/19 BY A,MOHAMED Performed at [facility], [place] [address] [place], [place] [address]    Report Status 06/03/2019 FINAL  Final  Respiratory Panel by PCR     Status: None   Collection Time: 06/03/19 10:12 AM   Specimen: Nasopharyngeal Swab; Respiratory  Result Value Ref Range Status   Adenovirus NOT DETECTED NOT DETECTED Final   Coronavirus 229E NOT DETECTED NOT DETECTED Final    Comment: (NOTE) The Coronavirus on the Respiratory Panel, DOES NOT test for the novel  Coronavirus (2019 nCoV)    Coronavirus HKU1 NOT DETECTED NOT DETECTED Final   Coronavirus NL63 NOT DETECTED NOT DETECTED Final   Coronavirus OC43 NOT DETECTED NOT DETECTED Final   Metapneumovirus NOT DETECTED NOT DETECTED Final   Rhinovirus / Enterovirus NOT DETECTED NOT DETECTED Final   Influenza A NOT DETECTED NOT DETECTED Final   Influenza B NOT DETECTED NOT DETECTED Final   Parainfluenza Virus 1 NOT DETECTED NOT DETECTED Final   Parainfluenza Virus 2 NOT DETECTED NOT DETECTED Final   Parainfluenza Virus 3 NOT DETECTED NOT DETECTED Final   Parainfluenza Virus 4 NOT DETECTED NOT DETECTED Final   Respiratory Syncytial Virus NOT DETECTED NOT DETECTED Final   Bordetella pertussis NOT DETECTED NOT DETECTED Final   Chlamydophila pneumoniae NOT DETECTED NOT DETECTED Final   Mycoplasma pneumoniae NOT DETECTED NOT DETECTED Final    Comment: Performed at [facility], [place]. [address], [place], [place] [address]  Culture, respiratory     Status: None   Collection Time: 06/03/19  4:40 PM   Specimen: SPU  Result Value Ref Range Status   Specimen Description   Final    SPUTUM Performed at [facility] [address] [place], [place] [address]    Special Requests   Final    NONE Performed at [facility], [place] [address], [place], [place] [address]    Gram Stain   Final    RARE WBC PRESENT, PREDOMINANTLY PMN RARE GRAM POSITIVE COCCI IN PAIRS RARE BUDDING YEAST SEEN Performed at [facility], [place] [address] [place], [place] [address]    Culture   Final    FEW CANDIDA TROPICALIS RARE ENTEROCOCCUS FAECALIS    Report Status 06/07/2019 FINAL  Final   Organism ID, Bacteria ENTEROCOCCUS FAECALIS  Final      Susceptibility   Enterococcus faecalis - MIC*    AMPICILLIN <=2 SENSITIVE Sensitive     VANCOMYCIN 1 SENSITIVE Sensitive     GENTAMICIN SYNERGY SENSITIVE Sensitive     * RARE ENTEROCOCCUS FAECALIS  MRSA PCR Screening     Status: None   Collection Time: 06/03/19  8:11 PM   Specimen: Nasopharyngeal  Result Value Ref Range Status   MRSA by PCR NEGATIVE NEGATIVE Final    Comment:        The GeneXpert MRSA Assay (FDA approved for NASAL specimens only), is one component of a comprehensive MRSA colonization surveillance program. It is not intended to diagnose MRSA infection nor to guide or monitor treatment for MRSA infections. Performed at [facility], [place] [address] [place], [place] [address]   Culture, blood (Routine X 2) w Reflex to ID Panel     Status: None (Preliminary result)  Collection Time: 06/05/19 10:43 AM   Specimen: BLOOD RIGHT HAND  Result Value Ref Range Status   Specimen Description   Final    BLOOD RIGHT HAND Performed at [facility] [address], [place], [place] [address]    Special Requests   Final    BOTTLES DRAWN AEROBIC AND ANAEROBIC Blood Culture adequate volume Performed at [facility] [address] [place], [place] [address]    Culture   Final    NO GROWTH 1 DAY Performed at [facility], [place] [address], [place], [place] [address]    Report Status PENDING  Incomplete  Culture, blood (Routine X 2) w Reflex to ID Panel     Status: None (Preliminary  result)   Collection Time: 06/05/19 10:43 AM   Specimen: BLOOD LEFT HAND  Result Value Ref Range Status   Specimen Description   Final    BLOOD LEFT HAND Performed at [facility] [address] [place], [place] [address]    Special Requests   Final    BOTTLES DRAWN AEROBIC AND ANAEROBIC Blood Culture adequate volume Performed at [facility] [address] [place], [place] [address]    Culture   Final    NO GROWTH 1 DAY Performed at [facility], [place] [address], [place], [place] [address]    Report Status PENDING  Incomplete    Studies/Results: DG Chest 1 View  Result Date: 06/06/2019 CLINICAL DATA:  Fever, shortness of breath, cough EXAM: CHEST  1 VIEW COMPARISON:  06/02/2019 FINDINGS: Interval worsening of diffuse bilateral interstitial airspace opacity. Unchanged cardiomegaly. Postoperative findings of posterior left chest wall resection. IMPRESSION: Interval worsening of diffuse bilateral interstitial airspace opacity, consistent with worsening edema and/or infection. Electronically Signed   By: Eddie Candle M.D.   On: 06/06/2019 09:56   CT CHEST W CONTRAST  Result Date: 06/06/2019 CLINICAL DATA:  59 year old male with history of hypoxemia. EXAM: CT CHEST WITH CONTRAST TECHNIQUE: Multidetector CT imaging of the chest was performed during intravenous contrast administration. CONTRAST:  64mL OMNIPAQUE IOHEXOL 300 MG/ML  SOLN COMPARISON:  Chest CTA 10/08/2018. FINDINGS: Cardiovascular: Heart size is normal. There is no significant pericardial fluid, thickening or pericardial calcification. There is aortic atherosclerosis, as well as atherosclerosis of the great vessels of the mediastinum and the coronary arteries, including calcified atherosclerotic plaque in the left main, left anterior descending, left circumflex and right coronary arteries. Mediastinum/Nodes: Prominent but nonenlarged mediastinal and hilar lymph nodes, measuring up to 1.1  cm in short axis in the prevascular nodal station, nonspecific. Esophagus is unremarkable in appearance. No axillary lymphadenopathy. Lungs/Pleura: Status post left thoracotomy for left lower lobectomy. Compensatory hyperexpansion of the left upper lobe. Widespread areas of ground-glass attenuation and septal thickening are noted throughout the lungs bilaterally, most evident throughout the mid to upper lungs. Several areas demonstrate relative sparing of the subpleural lung. No pleural effusions. No definite suspicious appearing pulmonary nodules or masses. Upper Abdomen: Aortic atherosclerosis.  Status post cholecystectomy. Musculoskeletal: Postoperative changes of left-sided thoracotomy and chest wall resection. New posterior rod and screw fixation device starting at T8 and extending below the lower margin of the images, traversing chronic compression fractures of T10 and T11 which both demonstrate greater than 90% loss of anterior vertebral body height resultant in an acute kyphotic deformity at the level of T10/T11. There are no aggressive appearing lytic or blastic lesions noted in the visualized portions of the skeleton. IMPRESSION: 1.  Widespread areas of ground-glass attenuation and septal thickening most evident throughout the mid to upper lungs, with some areas of subpleural sparing. Findings are nonspecific, favored to be of infectious or inflammatory etiology. 2. Aortic atherosclerosis, in addition to left main and 3 vessel coronary artery disease. Please note that although the presence of coronary artery calcium documents the presence of coronary artery disease, the severity of this disease and any potential stenosis cannot be assessed on this non-gated CT examination. Assessment for potential risk factor modification, dietary therapy or pharmacologic therapy may be warranted, if clinically indicated. 3. Postoperative changes, as above. Aortic Atherosclerosis (ICD10-I70.0). Electronically Signed   By:  Vinnie Langton M.D.   On: 06/06/2019 10:49   MR THORACIC SPINE WO CONTRAST  Result Date: 06/05/2019 CLINICAL DATA:  Mid back pain. Febrile. Prior fusion from T8-L2. History of vertebral osteomyelitis. EXAM: MRI THORACIC AND LUMBAR SPINE WITHOUT CONTRAST TECHNIQUE: Multiplanar and multiecho pulse sequences of the thoracic and lumbar spine were obtained without intravenous contrast. The patient refused to continue because he got hot. He said he would return at another time for contrast. COMPARISON:  Chest x-ray dated 06/02/2019 and thoracic radiographs dated 05/23/2019 and thoracic MRI dated 09/30/2018 FINDINGS: MRI THORACIC SPINE FINDINGS Alignment: Accentuated lower thoracic kyphosis secondary to previous bone destruction at T10-11. Vertebrae: Hardware obscures some detail from T8 distally. No visible discitis or osteomyelitis or other significant acute bone abnormality. Cord: The visualized portion of the thoracic spinal cord appears normal. Hardware obscures the spinal cord at T10 extending distally. Paraspinal and other soft tissues: There is an abnormal appearance of both lungs, left worse than right. Recent chest x-ray of 06/02/2019 did not demonstrate significant abnormalities. This appearance may be artifactual. Disc levels: There are tiny disc bulges at T2-3 and T3-4 without neural impingement. The discs from C7-T1 through T6-7 are otherwise normal. No spinal or foraminal stenoses. T7-8: Tiny disc bulge to the right of midline with no neural impingement. T8-9: Negative. T9-10: Negative. T10-11: The details of the T10 and T11 vertebral bodies is obscured by hardware. The appearance suggests that those vertebral bodies have fused with Ace secondary anterior wedge deformity due to the previous osteomyelitis. T11-12: No disc bulging or protrusion. T12-L1: No visible abnormality. MRI LUMBAR SPINE FINDINGS Segmentation:  Standard. Alignment:  2 mm retrolisthesis of L4 on L5. Vertebrae: Pedicle screws extend  from T8-L2. No acute abnormality of the lumbar spine. Conus medullaris and cauda equina: Conus extends to the L1-2 level. Conus and cauda equina appear normal. Paraspinal and other soft tissues: Negative. Disc levels: T12-L1 and L1-2: Normal disc. Pedicle screws. L2-3: Small broad-based disc bulge without neural impingement. No facet arthritis. Pedicle screws in L2. L3-4: Small broad-based disc bulge asymmetric into the left neural foramen. Moderate bilateral facet arthritis with ligamentum flavum hypertrophy creating mild spinal stenosis with slight left foraminal stenosis. However, the left L3 nerve exits without impingement. Nearing of both lateral recesses. L4-5: Small disc bulge slightly asymmetric to the right. Moderate hypertrophy and arthritis of the right facet joint. No neural impingement. L5-S1: Marked disc space narrowing with degenerative changes of the vertebral endplates. Small broad-based disc protrusion with accompanying osteophytes extending into both neural foramina without severe foraminal stenosis. No focal neural impingement. Minimal degenerative changes of the right facet joint. IMPRESSION: MR THORACIC SPINE IMPRESSION No discrete osteomyelitis or discitis in the thoracic spine. Detail is limited at the level of the previous discitis and osteomyelitis at T10-11. Wedge deformity of the now fused T10 and T11  vertebral bodies. The thoracic spine otherwise demonstrates no significant abnormalities. MR LUMBAR SPINE IMPRESSION 1. 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 2. Moderate right facet arthritis at L4-5. Electronically Signed   By: Lorriane Shire M.D.   On: 06/05/2019 14:57   MR LUMBAR SPINE WO CONTRAST  Result Date: 06/05/2019 CLINICAL DATA:  Mid back pain. Febrile. Prior fusion from T8-L2. History of vertebral osteomyelitis. EXAM: MRI THORACIC AND LUMBAR SPINE WITHOUT CONTRAST TECHNIQUE: Multiplanar and multiecho pulse sequences  of the thoracic and lumbar spine were obtained without intravenous contrast. The patient refused to continue because he got hot. He said he would return at another time for contrast. COMPARISON:  Chest x-ray dated 06/02/2019 and thoracic radiographs dated 05/23/2019 and thoracic MRI dated 09/30/2018 FINDINGS: MRI THORACIC SPINE FINDINGS Alignment: Accentuated lower thoracic kyphosis secondary to previous bone destruction at T10-11. Vertebrae: Hardware obscures some detail from T8 distally. No visible discitis or osteomyelitis or other significant acute bone abnormality. Cord: The visualized portion of the thoracic spinal cord appears normal. Hardware obscures the spinal cord at T10 extending distally. Paraspinal and other soft tissues: There is an abnormal appearance of both lungs, left worse than right. Recent chest x-ray of 06/02/2019 did not demonstrate significant abnormalities. This appearance may be artifactual. Disc levels: There are tiny disc bulges at T2-3 and T3-4 without neural impingement. The discs from C7-T1 through T6-7 are otherwise normal. No spinal or foraminal stenoses. T7-8: Tiny disc bulge to the right of midline with no neural impingement. T8-9: Negative. T9-10: Negative. T10-11: The details of the T10 and T11 vertebral bodies is obscured by hardware. The appearance suggests that those vertebral bodies have fused with Ace secondary anterior wedge deformity due to the previous osteomyelitis. T11-12: No disc bulging or protrusion. T12-L1: No visible abnormality. MRI LUMBAR SPINE FINDINGS Segmentation:  Standard. Alignment:  2 mm retrolisthesis of L4 on L5. Vertebrae: Pedicle screws extend from T8-L2. No acute abnormality of the lumbar spine. Conus medullaris and cauda equina: Conus extends to the L1-2 level. Conus and cauda equina appear normal. Paraspinal and other soft tissues: Negative. Disc levels: T12-L1 and L1-2: Normal disc. Pedicle screws. L2-3: Small broad-based disc bulge without neural  impingement. No facet arthritis. Pedicle screws in L2. L3-4: Small broad-based disc bulge asymmetric into the left neural foramen. Moderate bilateral facet arthritis with ligamentum flavum hypertrophy creating mild spinal stenosis with slight left foraminal stenosis. However, the left L3 nerve exits without impingement. Nearing of both lateral recesses. L4-5: Small disc bulge slightly asymmetric to the right. Moderate hypertrophy and arthritis of the right facet joint. No neural impingement. L5-S1: Marked disc space narrowing with degenerative changes of the vertebral endplates. Small broad-based disc protrusion with accompanying osteophytes extending into both neural foramina without severe foraminal stenosis. No focal neural impingement. Minimal degenerative changes of the right facet joint. IMPRESSION: MR THORACIC SPINE IMPRESSION No discrete osteomyelitis or discitis in the thoracic spine. Detail is limited at the level of the previous discitis and osteomyelitis at T10-11. 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 The thoracic spine otherwise demonstrates no significant abnormalities. MR LUMBAR SPINE IMPRESSION 1. 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 2. Moderate right facet arthritis at L4-5. Electronically Signed   By: Lorriane Shire M.D.   On: 06/05/2019 14:57   [place] EKG SITE RITE  Result Date: 06/06/2019 If Site Rite image not attached, placement could not be  confirmed due to current cardiac rhythm.    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  No osteo on MRI of T-L spine. PIC pending Repeat BCx 1-13 ngtd (Dr Doristine Devoid) Available if  questions over the weekend.          Bobby Rumpf MD, FACP Infectious Diseases (pager) [phone] [email or web address] 06/07/2019, 10:19 AM  LOS: 4 days

## 2019-06-08 LAB — CBC
HCT: 35.7 % — ABNORMAL LOW (ref 39.0–52.0)
Hemoglobin: 11.6 g/dL — ABNORMAL LOW (ref 13.0–17.0)
MCH: 28.6 pg (ref 26.0–34.0)
MCHC: 32.5 g/dL (ref 30.0–36.0)
MCV: 88.1 fL (ref 80.0–100.0)
Platelets: 497 10*3/uL — ABNORMAL HIGH (ref 150–400)
RBC: 4.05 MIL/uL — ABNORMAL LOW (ref 4.22–5.81)
RDW: 14.3 % (ref 11.5–15.5)
WBC: 17.4 10*3/uL — ABNORMAL HIGH (ref 4.0–10.5)
nRBC: 0 % (ref 0.0–0.2)

## 2019-06-08 LAB — BASIC METABOLIC PANEL
Anion gap: 12 (ref 5–15)
BUN: 23 mg/dL — ABNORMAL HIGH (ref 6–20)
CO2: 26 mmol/L (ref 22–32)
Calcium: 9.2 mg/dL (ref 8.9–10.3)
Chloride: 96 mmol/L — ABNORMAL LOW (ref 98–111)
Creatinine, Ser: 0.75 mg/dL (ref 0.61–1.24)
GFR calc Af Amer: >60 mL/min (ref >60)
GFR calc non Af Amer: >60 mL/min (ref >60)
Glucose, Bld: 160 mg/dL — ABNORMAL HIGH (ref 70–99)
Potassium: 4.1 mmol/L (ref 3.5–5.1)
Sodium: 134 mmol/L — ABNORMAL LOW (ref 135–145)

## 2019-06-08 LAB — VANCOMYCIN, TROUGH: Vancomycin Tr: 12 ug/mL — ABNORMAL LOW (ref 15–20)

## 2019-06-08 LAB — VANCOMYCIN, PEAK: Vancomycin Pk: 26 ug/mL — ABNORMAL LOW (ref 30–40)

## 2019-06-08 MED ORDER — IPRATROPIUM-ALBUTEROL 20-100 MCG/ACT IN AERS
2.0000 | INHALATION_SPRAY | Freq: Two times a day (BID) | RESPIRATORY_TRACT | Status: DC
  Administered 2019-06-08 – 2019-06-12 (×8): 2 via RESPIRATORY_TRACT

## 2019-06-08 MED ORDER — ALUM & MAG HYDROXIDE-SIMETH 200-200-20 MG/5ML PO SUSP
30.0000 mL | Freq: Once | ORAL | Status: AC
  Administered 2019-06-09: 30 mL via ORAL
  Filled 2019-06-08: qty 30

## 2019-06-08 NOTE — Progress Notes (Signed)
PROGRESS NOTE    Howard Fox  [id number] DOB: December 31, 1960 DOA: 06/02/2019 PCP: Unk Pinto, MD    Brief Narrative: 59 year old male with history of HTN, HLD, diabetes, chronic pain syndrome, remote history of IVDU, history of vertebral osteomyelitis on chronic suppressive doxycycline, depression, hep C status post recent who came to the ED for persistent and worsening shortness of breath and associated fever&chills.Symptoms have been present for the past couple of days, progressive, and came to the hospital. His symptoms remind him of his pneumonia from last year. In the ED he was febrile, tachypneic, hypoxic requiring supplemental oxygen. COVID-19 was negative. He was admitted to the hospital and placed on broad-spectrum antibiotics for Pneumonia and after admission his blood cultures showed MRSA bacteremia.  06/05/2019-he is resting in bed anxious to go home.Saturation dropped to 77% overnight when he took off his oxygen accidentally in his sleep. Currently he is on 6 L of oxygen to keep saturation above 92%.  06/07/2019 patient's oxygen requirement went up to 15 L overnight.   06/08/2019 patient on 13 L of high flow nasal cannula.  Reports that he slept well.  Does not appear to be in any distress.  Assessment & Plan:   Principal Problem:   Community acquired pneumonia Active Problems:   Bipolar disorder ([place])   Essential hypertension   Hyperlipidemia, mixed   Vitamin D deficiency   MRSA bacteremia   Vertebral osteomyelitis (HCC)   Fever   Dyspnea   Chronic pain syndrome   Pneumonia   #1 acute hypoxic respiratory failure secondary to pneumonia versus edema and COPD exacerbation.  Patient smoked for over 40 years. Patient was initially treated with Rocephin azithromycin and Vanco. Continue IV Solu-Medrol without taper today. He is on 12 L high flow nasal cannula saturating 92%. Chest CT with groundglass opacities edema versus inflammation versus  infection. ID has been consulted and narrowed antibiotics down to vancomycin. Lasix 20 mg twice a day.  Continue.  Positive by 800 cc. TEE was canceled as he is not stable and remains hypoxic.  Echo shows normal ejection fraction.Left ventricular ejection fraction, by visual estimation, is 55 to 60%. The left ventricle has normal function. There is no left ventricular hypertrophy. Left ventricular diastolic parameters are indeterminate. The left ventricle has no regional wall motion abnormalities. Global right ventricle has normal systolic function.The right ventricular size is normal. No increase in right ventricular wall thickness. Left atrial size was normal. Right atrial size was normal. The mitral valve is normal in structure. No evidence of mitral valve regurgitation. No evidence of mitral stenosis. The tricuspid valve is normal in structure. The aortic valve is normal in structure. Aortic valve regurgitation is not visualized. No evidence of aortic valve sclerosis or stenosis. The pulmonic valve was normal in structure. Pulmonic valve regurgitation is not visualized. The inferior vena cava is normal in size with greater than 50% respiratory variability, suggesting right atrial pressure of 3 mmHg.  #2 MRSA bacteremia with history of T-spine discitis-patient stopped doxy as an outpatient. Now on IV Vanco per ID. MRI of the thoracolumbar spine shows no evidence of osteomyelitis.  PICC line placed 06/07/2019 right upper extremity.  #3 history of hepatitis C patient received treatment and tested negative.  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   Weight as of this encounter: 109.8 kg.  DVT prophylaxis:Lovenox Code Status:Full code   family Communication:Discussed with patient Disposition Planpatient is being treated with vancomycin for MRSA bacteremia still with ongoing work-up still with leukocytosis and significant hypoxia and fever Consultants:  ID, cardiology  Procedures:None  Antimicrobials:Vancomycin   Subjective:  Objective: Vitals:   06/07/19 2227 06/08/19 0621 06/08/19 0841 06/08/19 1212  BP: 126/78 135/87    Pulse: 99 97    Resp: 20 (!) 21    Temp: 98.3 F (36.8 C) 98 F (36.7 C)    TempSrc: Oral Oral    SpO2: 90% 92% 91% 92%  Weight:      Height:        Intake/Output Summary (Last 24 hours) at 06/08/2019 1216 Last data filed at 06/08/2019 1145 Gross per 24 hour  Intake 1563 ml  Output 1300 ml  Net 263 ml   Filed Weights   06/02/19 2232  Weight: 109.8 kg    Examination:  General exam: Appears calm and comfortable  Respiratory system: Bilateral bibasilar Rales to auscultation. Respiratory effort normal. Cardiovascular system: S1 & S2 heard, RRR. No JVD, murmurs, rubs, gallops or clicks. No pedal edema. Gastrointestinal system: Abdomen is nondistended, soft and nontender. No organomegaly or masses felt. Normal bowel sounds heard. Central nervous system: Alert and oriented. No focal neurological deficits. Extremities: Right upper extremity PICC in place Skin: No rashes, lesions or ulcers Psychiatry: Judgement and insight appear normal. Mood & affect appropriate.     Data Reviewed: I have personally reviewed following labs and imaging studies  CBC: Recent Labs  Lab 06/02/19 2235 06/02/19 2235 06/03/19 0212 06/03/19 [id number] 06/05/19 0616 06/06/19 0944 06/08/19 0001  WBC 9.9   < > 9.5 10.3 14.3* 18.9* 17.4*  NEUTROABS 8.5*  --   --  8.5*  --   --   --   HGB 12.8*   < > 11.9* 12.7* 12.2* 12.4* 11.6*  HCT 39.4   < > 37.6* 39.1 38.2* 38.0* 35.7*  MCV 88.9   < > 90.0 89.1 90.3 88.2 88.1  PLT 348   < > 345 353 387 398 497*   < > = values in this interval not displayed.    Basic Metabolic Panel: Recent Labs  Lab 06/02/19 2235 06/03/19 0212 06/03/19 [id number] 06/03/19 [id number] 06/04/19 0519 06/05/19 0616 06/06/19 0032 06/07/19 0536 06/08/19 0001  NA 134*  --  136  --   --  135  --   --  134*  K 3.7  --  3.5  --   --  4.4  --   --  4.1  CL 99  --  99  --   --  100  --   --  96*  CO2 24  --  23  --   --  23  --   --  26  GLUCOSE 131*  --  147*  --   --  126*  --   --  160*  BUN 9  --  8  --   --  15  --   --  23*  CREATININE 0.82   < > 0.76   < > 0.82 0.86 0.78 0.70 0.75  CALCIUM 9.1  --  8.7*  --   --  8.9  --   --  9.2   < > = values in this interval not displayed.   GFR: Estimated Creatinine Clearance: 130.4 mL/min (by C-G  formula based on SCr of 0.75 mg/dL). Liver Function Tests: Recent Labs  Lab 06/02/19 2235 06/05/19 0616  AST 28 27  ALT 21 24  ALKPHOS 99 103  BILITOT 0.3 0.4  PROT 8.0 7.3  ALBUMIN 3.1* 2.8*   No results for input(s): LIPASE, AMYLASE in the last 168 hours. No results for input(s): AMMONIA in the last 168 hours. Coagulation Profile: No results for input(s): INR, PROTIME in the last 168 hours. Cardiac Enzymes: No results for input(s): CKTOTAL, CKMB, CKMBINDEX, TROPONINI in the last 168 hours. BNP (last 3 results) No results for input(s): PROBNP in the last 8760 hours. HbA1C: No results for input(s): HGBA1C in the last 72 hours. CBG: No results for input(s): GLUCAP in the last 168 hours. Lipid Profile: No results for input(s): CHOL, HDL, LDLCALC, TRIG, CHOLHDL, LDLDIRECT in the last 72 hours. Thyroid Function Tests: No results for input(s): TSH, T4TOTAL, FREET4, T3FREE, THYROIDAB in the last 72 hours. Anemia Panel: No results for input(s): VITAMINB12, FOLATE, FERRITIN, TIBC, IRON, RETICCTPCT in the last 72 hours. Sepsis Labs: Recent Labs  Lab 06/02/19 2235 06/03/19 0035 06/03/19 0212 06/03/19 0613 06/04/19 0519  PROCALCITON  --   --  0.14 0.16 <0.10  LATICACIDVEN 1.2 1.1  --   --   --     Recent Results  (from the past 240 hour(s))  Blood culture (routine x 2)     Status: Abnormal   Collection Time: 06/02/19 10:45 PM   Specimen: BLOOD  Result Value Ref Range Status   Specimen Description   Final    BLOOD LEFT ANTECUBITAL Performed at [facility] [address], [place], [place] [address]    Special Requests   Final    BOTTLES DRAWN AEROBIC AND ANAEROBIC Blood Culture adequate volume   Culture  Setup Time   Final    GRAM POSITIVE COCCI IN CLUSTERS IN BOTH AEROBIC AND ANAEROBIC BOTTLES CRITICAL RESULT CALLED TO, READ BACK BY AND VERIFIED WITH: Shelda Jakes PHARMD 1640 06/03/19 A BROWNING    Culture (A)  Final    STAPHYLOCOCCUS AUREUS SUSCEPTIBILITIES PERFORMED ON PREVIOUS CULTURE WITHIN THE LAST 5 DAYS. Performed at [facility], [place] [address], [place], [place] [address]    Report Status 06/05/2019 FINAL  Final  Blood culture (routine x 2)     Status: Abnormal   Collection Time: 06/03/19 12:05 AM   Specimen: BLOOD  Result Value Ref Range Status   Specimen Description   Final    BLOOD RIGHT ANTECUBITAL Performed at [facility], [place] [address], [place], [place] [address]    Special Requests   Final    BOTTLES DRAWN AEROBIC AND ANAEROBIC Blood Culture adequate volume Performed at [facility] [address] [place], [place] [address]    Culture  Setup Time   Final    GRAM POSITIVE COCCI IN CLUSTERS IN BOTH AEROBIC AND ANAEROBIC BOTTLES CRITICAL RESULT CALLED TO, READ BACK BY AND VERIFIED WITH: J LEGGE PHARMD 2200 06/03/19 A BROWNING Performed at [facility], [place] [address], [place], [place] [address]    Culture METHICILLIN RESISTANT STAPHYLOCOCCUS AUREUS (A)  Final   Report Status 06/05/2019 FINAL  Final   Organism ID, Bacteria METHICILLIN RESISTANT STAPHYLOCOCCUS AUREUS  Final      Susceptibility   Methicillin resistant staphylococcus aureus - MIC*    CIPROFLOXACIN >=8 RESISTANT Resistant     ERYTHROMYCIN >=8 RESISTANT  Resistant     GENTAMICIN <=0.5 SENSITIVE Sensitive  OXACILLIN >=4 RESISTANT Resistant     TETRACYCLINE <=1 SENSITIVE Sensitive     VANCOMYCIN 1 SENSITIVE Sensitive     TRIMETH/SULFA <=10 SENSITIVE Sensitive     CLINDAMYCIN <=0.25 SENSITIVE Sensitive     RIFAMPIN <=0.5 SENSITIVE Sensitive     Inducible Clindamycin NEGATIVE Sensitive     * METHICILLIN RESISTANT STAPHYLOCOCCUS AUREUS  Blood Culture ID Panel (Reflexed)     Status: Abnormal   Collection Time: 06/03/19 12:05 AM  Result Value Ref Range Status   Enterococcus species NOT DETECTED NOT DETECTED Final   Listeria monocytogenes NOT DETECTED NOT DETECTED Final   Staphylococcus species DETECTED (A) NOT DETECTED Final    Comment: CRITICAL RESULT CALLED TO, READ BACK BY AND VERIFIED WITH: J LEGGE PHARMD 2200 06/03/19 A BROWNING    Staphylococcus aureus (BCID) DETECTED (A) NOT DETECTED Final    Comment: Methicillin (oxacillin)-resistant Staphylococcus aureus (MRSA). MRSA is predictably resistant to beta-lactam antibiotics (except ceftaroline). Preferred therapy is vancomycin unless clinically contraindicated. Patient requires contact precautions if  hospitalized. CRITICAL RESULT CALLED TO, READ BACK BY AND VERIFIED WITH: J LEGGE PHARMD 2200 06/03/19 A BROWNING    Methicillin resistance DETECTED (A) NOT DETECTED Final    Comment: CRITICAL RESULT CALLED TO, READ BACK BY AND VERIFIED WITH: J LEGGE PHARMD 2200 06/03/19 A BROWNING    Streptococcus species NOT DETECTED NOT DETECTED Final   Streptococcus agalactiae NOT DETECTED NOT DETECTED Final   Streptococcus pneumoniae NOT DETECTED NOT DETECTED Final   Streptococcus pyogenes NOT DETECTED NOT DETECTED Final   Acinetobacter baumannii NOT DETECTED NOT DETECTED Final   Enterobacteriaceae species NOT DETECTED NOT DETECTED Final   Enterobacter cloacae complex NOT DETECTED NOT DETECTED Final   Escherichia coli NOT DETECTED NOT DETECTED Final   Klebsiella oxytoca NOT DETECTED NOT DETECTED  Final   Klebsiella pneumoniae NOT DETECTED NOT DETECTED Final   Proteus species NOT DETECTED NOT DETECTED Final   Serratia marcescens NOT DETECTED NOT DETECTED Final   Haemophilus influenzae NOT DETECTED NOT DETECTED Final   Neisseria meningitidis NOT DETECTED NOT DETECTED Final   Pseudomonas aeruginosa NOT DETECTED NOT DETECTED Final   Candida albicans NOT DETECTED NOT DETECTED Final   Candida glabrata NOT DETECTED NOT DETECTED Final   Candida krusei NOT DETECTED NOT DETECTED Final   Candida parapsilosis NOT DETECTED NOT DETECTED Final   Candida tropicalis NOT DETECTED NOT DETECTED Final    Comment: Performed at [facility], [place]. [address], [place], [place] [address]  SARS CORONAVIRUS 2 (TAT 6-24 HRS) Nasopharyngeal Nasopharyngeal Swab     Status: None   Collection Time: 06/03/19 12:35 AM   Specimen: Nasopharyngeal Swab  Result Value Ref Range Status   SARS Coronavirus 2 NEGATIVE NEGATIVE Final    Comment: (NOTE) SARS-CoV-2 target nucleic acids are NOT DETECTED. The SARS-CoV-2 RNA is generally detectable in upper and lower respiratory specimens during the acute phase of infection. Negative results do not preclude SARS-CoV-2 infection, do not rule out co-infections with other pathogens, and should not be used as the sole basis for treatment or other patient management decisions. Negative results must be combined with clinical observations, patient history, and epidemiological information. The expected result is Negative. Fact Sheet for Patients: SugarRoll.be Fact Sheet for Healthcare Providers: https://www.woods-.com/ This test is not yet approved or cleared by the [place] FDA and  has been authorized for detection and/or diagnosis of SARS-CoV-2 by FDA under an Emergency Use Authorization (EUA). This EUA will remain  in effect (meaning this test can  be used) for the duration of the COVID-19 declaration under Section 56  4(b)(1) of the Act, 21 U.S.C. section 360bbb-3(b)(1), unless the authorization is terminated or revoked sooner. Performed at [facility], [place] [address], [place], [place] [address]   Expectorated sputum assessment w rflx to resp cult     Status: None   Collection Time: 06/03/19  4:41 AM   Specimen: Expectorated Sputum  Result Value Ref Range Status   Specimen Description EXPECTORATED SPUTUM  Final   Special Requests NONE  Final   Sputum evaluation   Final    Sputum specimen not acceptable for testing.  Please recollect.   NOFIFIED [facility] AT [id number] ON 06/03/19 BY A,MOHAMED Performed at [facility], [place] [address] [place], [place] [address]    Report Status 06/03/2019 FINAL  Final  Respiratory Panel by PCR     Status: None   Collection Time: 06/03/19 10:12 AM   Specimen: Nasopharyngeal Swab; Respiratory  Result Value Ref Range Status   Adenovirus NOT DETECTED NOT DETECTED Final   Coronavirus 229E NOT DETECTED NOT DETECTED Final    Comment: (NOTE) The Coronavirus on the Respiratory Panel, DOES NOT test for the novel  Coronavirus (2019 nCoV)    Coronavirus HKU1 NOT DETECTED NOT DETECTED Final   Coronavirus NL63 NOT DETECTED NOT DETECTED Final   Coronavirus OC43 NOT DETECTED NOT DETECTED Final   Metapneumovirus NOT DETECTED NOT DETECTED Final   Rhinovirus / Enterovirus NOT DETECTED NOT DETECTED Final   Influenza A NOT DETECTED NOT DETECTED Final   Influenza B NOT DETECTED NOT DETECTED Final   Parainfluenza Virus 1 NOT DETECTED NOT DETECTED Final   Parainfluenza Virus 2 NOT DETECTED NOT DETECTED Final   Parainfluenza Virus 3 NOT DETECTED NOT DETECTED Final   Parainfluenza Virus 4 NOT DETECTED NOT DETECTED Final   Respiratory Syncytial Virus NOT DETECTED NOT DETECTED Final   Bordetella pertussis NOT DETECTED NOT DETECTED Final   Chlamydophila pneumoniae NOT DETECTED NOT DETECTED Final   Mycoplasma pneumoniae NOT DETECTED NOT DETECTED Final    Comment:  Performed at [facility], [place]. [address], [place], [place] [address]  Culture, respiratory     Status: None   Collection Time: 06/03/19  4:40 PM   Specimen: SPU  Result Value Ref Range Status   Specimen Description   Final    SPUTUM Performed at [facility] [address] [place], [place] [address]    Special Requests   Final    NONE Performed at [facility], [place] [address] [place], [place] [address]    Gram Stain   Final    RARE WBC PRESENT, PREDOMINANTLY PMN RARE GRAM POSITIVE COCCI IN PAIRS RARE BUDDING YEAST SEEN Performed at [facility], [place] [address], [place], [place] [address]    Culture   Final    FEW CANDIDA TROPICALIS RARE ENTEROCOCCUS FAECALIS    Report Status 06/07/2019 FINAL  Final   Organism ID, Bacteria ENTEROCOCCUS FAECALIS  Final      Susceptibility   Enterococcus faecalis - MIC*    AMPICILLIN <=2 SENSITIVE Sensitive     VANCOMYCIN 1 SENSITIVE Sensitive     GENTAMICIN SYNERGY SENSITIVE Sensitive     * RARE ENTEROCOCCUS FAECALIS  MRSA PCR Screening     Status: None   Collection Time: 06/03/19  8:11 PM   Specimen: Nasopharyngeal  Result Value Ref Range Status   MRSA by PCR NEGATIVE NEGATIVE Final  Comment:        The GeneXpert MRSA Assay (FDA approved for NASAL specimens only), is one component of a comprehensive MRSA colonization surveillance program. It is not intended to diagnose MRSA infection nor to guide or monitor treatment for MRSA infections. Performed at [facility], [place] [address], [place], [place] [address]   Culture, blood (Routine X 2) w Reflex to ID Panel     Status: None (Preliminary result)   Collection Time: 06/05/19 10:43 AM   Specimen: BLOOD RIGHT HAND  Result Value Ref Range Status   Specimen Description   Final    BLOOD RIGHT HAND Performed at [facility] [address], [place], [place] [address]    Special Requests   Final     BOTTLES DRAWN AEROBIC AND ANAEROBIC Blood Culture adequate volume Performed at [facility] [address], [place], [place] [address]    Culture   Final    NO GROWTH 3 DAYS Performed at [facility], [place] [address] [place], [place] [address]    Report Status PENDING  Incomplete  Culture, blood (Routine X 2) w Reflex to ID Panel     Status: None (Preliminary result)   Collection Time: 06/05/19 10:43 AM   Specimen: BLOOD LEFT HAND  Result Value Ref Range Status   Specimen Description   Final    BLOOD LEFT HAND Performed at [facility] [address], [place], [place] [address]    Special Requests   Final    BOTTLES DRAWN AEROBIC AND ANAEROBIC Blood Culture adequate volume Performed at [facility] [address] [place], [place] [address]    Culture   Final    NO GROWTH 3 DAYS Performed at [facility], [place] [address] [place], [place] [address]    Report Status PENDING  Incomplete         Radiology Studies: No results found.      Scheduled Meds: . amitriptyline  150 mg Oral QHS  . aspirin EC  325 mg Oral Daily  . Chlorhexidine Gluconate Cloth  6 each Topical Daily  . citalopram  20 mg Oral Q lunch  . furosemide  20 mg Intravenous BID  . gabapentin  300 mg Oral TID  . ipratropium-albuterol  3 mL Nebulization QID  . methylPREDNISolone (SOLU-MEDROL) injection  60 mg Intravenous Q8H  . polyethylene glycol  17 g Oral Daily  . potassium chloride  10 mEq Oral BID  . rosuvastatin  40 mg Oral Daily  . sodium chloride flush  10-40 mL Intracatheter Q12H  . cholecalciferol  5,000 Units Oral Daily   Continuous Infusions: . Vancomycin 750 mg (06/08/19 0533)     LOS: 5 days     Georgette Shell, MD [facility]  If 7PM-7AM, please contact night-coverage [email or web address] Password [facility] 06/08/2019, 12:16 PM

## 2019-06-08 NOTE — Progress Notes (Signed)
Pharmacy Antibiotic Note  Howard Fox is a 59 y.o. male admitted on 06/02/2019 with bacteremia.  Pharmacy has been consulted for Vancomycin dosing.  Plan: Continue with vancomycin 750mg  iv q8hr dosing  AUC = 457  Height: 6' (182.9 cm) Weight: 242 lb (109.8 kg) IBW/kg (Calculated) : 77.6  Temp (24hrs), Avg:98.2 F (36.8 C), Min:98 F (36.7 C), Max:98.3 F (36.8 C)  Recent Labs  Lab 06/02/19 2235 06/02/19 2235 06/03/19 0035 06/03/19 0212 06/03/19 0212 06/03/19 [id number] 06/03/19 [id number] 06/04/19 0519 06/05/19 0616 06/06/19 0032 06/06/19 0944 06/07/19 0536 06/08/19 0001 06/08/19 0527  WBC 9.9   < >  --  9.5  --  10.3  --   --  14.3*  --  18.9*  --  17.4*  --   CREATININE 0.82   < >  --  0.89   < > 0.76   < > 0.82 0.86 0.78  --  0.70 0.75  --   LATICACIDVEN 1.2  --  1.1  --   --   --   --   --   --   --   --   --   --   --   VANCOTROUGH  --   --   --   --   --   --   --   --   --   --  8*  --   --  12*  VANCOPEAK  --   --   --   --   --   --   --   --   --  35  --   --  26*  --    < > = values in this interval not displayed.    Estimated Creatinine Clearance: 130.4 mL/min (by C-G formula based on SCr of 0.75 mg/dL).    Allergies  Allergen Reactions  . Codeine Itching  . Dilaudid [Hydromorphone Hcl] Itching  . Shellfish Allergy Diarrhea and Nausea And Vomiting    Antimicrobials this admission: 1/11 azith >> 1/12 1/11 CTX >> 1/12 1/12 vanc>> Dose adjustments this admission:  1/14 VP = 35 @ 00:32 ( vanco 1250mg  IV q12h, dose 1/13 at 21:58) 1/14 VT = 8 @ 09:44 AUC = 498, pk = 41.5, T = 7.7  1/16 VP = 26 @0001  (Vancomycin 750mg  iv q8hr; dose 1/15 2204) 1/16 VT = 12 @0527  (AUC = 457.5, Cmax 29.8, Cmin 11)   Microbiology results: 1/10 Covid neg 1/11 MRSA PCR neg 1/11 strep pneumo neg 1/11 sputum: rare budding yeast, rare GPcocci in pairs. Rare WBCs 1/11 resp panel neg 1/11 BCx2: 4/4 MRSA    Thank you for allowing pharmacy to be a part of this patient's  care.  Nani Skillern Crowford 06/08/2019 6:28 AM

## 2019-06-09 ENCOUNTER — Inpatient Hospital Stay (HOSPITAL_COMMUNITY): Payer: Medicare Other

## 2019-06-09 DIAGNOSIS — I10 Essential (primary) hypertension: Secondary | ICD-10-CM

## 2019-06-09 MED ORDER — FUROSEMIDE 10 MG/ML IJ SOLN
40.0000 mg | Freq: Two times a day (BID) | INTRAMUSCULAR | Status: DC
  Administered 2019-06-09 – 2019-06-12 (×6): 40 mg via INTRAVENOUS
  Filled 2019-06-09 (×6): qty 4

## 2019-06-09 MED ORDER — FUROSEMIDE 10 MG/ML IJ SOLN
40.0000 mg | Freq: Once | INTRAMUSCULAR | Status: AC
  Administered 2019-06-09: 11:00:00 40 mg via INTRAVENOUS
  Filled 2019-06-09: qty 4

## 2019-06-09 NOTE — Progress Notes (Signed)
NRB applied by RT. O2 sats at 100%. Pulse 78. Pt sitting in bed; alert.

## 2019-06-09 NOTE — Progress Notes (Signed)
This shift pts nasal cannula came out while sleeping, had a nightmare pulling and thrashing removed PICC tubing, o2 sats dropped into 70's, jumped up and came to the door. Staff tried to assist back to bed to get oxygen on, with some difficulty. Impulsive, agitated and hard to redirect throughout the night. Will continue to monitor.

## 2019-06-09 NOTE — Progress Notes (Signed)
RT called to pt room by RN for desaturations. Pt saturation was 81% when RT entered the room. Pt was on 10L [place], RT placed pt on 15L and saturations only increased to 85%. Pt had no shortness of breath and was talking full sentences. RT placed pt on NRB 15L, pt saturations went up to 100%. Pt was on NRB for about 15 minutes and RT placed pt back on [place] 15L. Pt saturations now maintaining at this time between 90-92% with no distress from pt. RT will continue to monitor pt status.

## 2019-06-09 NOTE — Progress Notes (Signed)
Have come to patient's room to find him on 10L/min O2 via [place] with sats 78%. Increased O2 to 12L/min and sats came to 81%. Have called RT. She has arrived at bedside. Increased O2 to 15L/min via [place] and RT will now apply NRB. Have paged MD.

## 2019-06-09 NOTE — Progress Notes (Signed)
Writer called to room by patient, who stated he "just feels like I'm not getting any air". Pt has c/o of this since this mornings "episode" of decreased O2 and SOB. In assessing the O2 equipment, writer noted that the oxygen was blowing out around the wall, and not thru the humidity bottle. O2 at 78% on 15 L/min via [place]. Writer change out humidity bottle and O2 tubing and tightened the wall apparatus. Instanty, the O2 sound was coming out of the tubing and air was flowing freely. Placed on pt and O2 went to 100% immediately. Have been able to drop the O2 requirement to 8 L/min O2 via [place] with sats at 96%. Will continue to monitor and try to wean pt very slowly.

## 2019-06-09 NOTE — Progress Notes (Signed)
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 His symptoms remind him of his pneumonia from last year. In the ED he was febrile, tachypneic, hypoxic requiring supplemental oxygen. COVID-19 was negative. He was admitted to the hospital and placed on broad-spectrum antibiotics for Pneumonia and after admission his blood cultures showed MRSA bacteremia.  06/05/2019-he is resting in bed anxious to go home.Saturation dropped to 77% overnight when he took off his oxygen accidentally in his sleep. Currently he is on 6 L of oxygen to keep saturation above 92%.  06/07/2019 patient's oxygen requirement went up to 15 L overnight.  06/08/2019 patient on 13 L of high flow nasal cannula.  Reports that he slept well.  Does not appear to be in any distress.  1/17- desaturated to  78 on 10 lit placed back on 15 lit he is refusing nebulizer treatments reporting its making him sick Stat chest x-ray done shows evidence of pulmonary edema likely some left pleural effusion.  Assessment & Plan:   Principal Problem:   Community acquired pneumonia Active Problems:   Bipolar disorder ([place])   Essential hypertension   Hyperlipidemia, mixed   Vitamin D deficiency   MRSA bacteremia   Vertebral osteomyelitis (HCC)   Fever   Dyspnea   Chronic pain syndrome   Pneumonia    #1 acute hypoxic respiratory failure multifactorial -COPD exacerbation/pulmonary edema/pneumonia -patient really has not made much improvement remains on high  flow nasal cannula though he does not appear to be in any distress.. Patient was initially treated with Rocephin azithromycin and Vanco. Continue IV Solu-Medrol  Chest CT with groundglass opacities edema versus inflammation versus infection. ID has been consulted for MRSA bacteremia and narrowed antibiotics down to vancomycin. Increase Lasix to 40 mg twice a day I's and O's and fluid restriction.  Is positive by 790 cc. TEE was canceled as he is not stable and remains hypoxic.  Echo shows normal ejection fraction.Left ventricular ejection fraction, by visual estimation, is 55 to 60%. The left ventricle has normal function. There is no left ventricular hypertrophy. Left ventricular diastolic parameters are indeterminate. The left ventricle has no regional wall motion abnormalities. Global right ventricle has normal systolic function.The right ventricular size is normal. No increase in right ventricular wall thickness. Left atrial size was normal. Right atrial size was normal. The mitral valve is normal in structure. No evidence of mitral valve regurgitation. No evidence of mitral stenosis. The tricuspid valve is normal in structure. The aortic valve is normal in structure. Aortic valve regurgitation is not visualized. No evidence of aortic valve sclerosis or stenosis. The pulmonic valve was normal in structure. Pulmonic valve regurgitation is not visualized. The inferior vena cava is normal in size with greater than 50% respiratory variability, suggesting right atrial pressure of 3 mmHg.  #2 MRSA bacteremia with history of T-spine discitis-patient stopped doxy as an outpatient. Now on IV Vanco per ID. MRI of the thoracolumbar spine shows no evidence of osteomyelitis.PICC line placed 06/07/2019 right upper extremity.  #3 history of hepatitis C patient received treatment and tested negative.  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   Weight as of this encounter: 109.8 kg.  DVT prophylaxis:Lovenox Code Status:Full code  family Communication:Discussed with his mother Bonnita Nasuti [id number]. Disposition Planpatient is being treated with vancomycin for MRSA bacteremia still with ongoing work-up still with leukocytosis and significant hypoxia and fever Consultants:  ID, cardiology  Procedures:None  Antimicrobials:Vancomycin   Subjective:awake alert took oxygen off overnight became hypoxic he was confused   Objective: Vitals:   06/09/19 0630 06/09/19 0806 06/09/19 0957 06/09/19 1004  BP: 133/84     Pulse: 84     Resp: (!) 21  (!) 22   Temp: 98.1 F (36.7 C)     TempSrc: Oral     SpO2: 96% 90%  (!) 88%  Weight:      Height:        Intake/Output Summary (Last 24 hours) at 06/09/2019 1225 Last data filed at 06/09/2019 1133 Gross per 24 hour  Intake 832 ml  Output 875 ml  Net -43 ml   Filed Weights   06/02/19 2232  Weight: 109.8 kg    Examination:  General exam: Appears calm and comfortable  Respiratory system: Crackles bases diminished breath sounds  to auscultation. Respiratory effort normal. Cardiovascular system: S1 & S2 heard, RRR. No JVD, murmurs, rubs, gallops or clicks. No pedal edema. Gastrointestinal system: Abdomen is distended, soft and nontender. No organomegaly or masses felt. Normal bowel sounds heard. Central nervous system: Alert and oriented. No focal neurological deficits. Extremities: Symmetric 5 x 5 power. Skin: No rashes, lesions or ulcers Psychiatry: Judgement and insight appear normal. Mood & affect appropriate.     Data Reviewed: I have personally reviewed following labs and imaging studies  CBC: Recent Labs  Lab 06/02/19 2235 06/02/19 2235 06/03/19 0212  06/03/19 [id number] 06/05/19 0616 06/06/19 0944 06/08/19 0001  WBC 9.9   < > 9.5 10.3 14.3* 18.9* 17.4*  NEUTROABS 8.5*  --   --  8.5*  --   --   --   HGB 12.8*   < > 11.9* 12.7* 12.2* 12.4* 11.6*  HCT 39.4   < > 37.6* 39.1 38.2* 38.0* 35.7*  MCV 88.9   < > 90.0 89.1 90.3 88.2 88.1  PLT 348   < > 345 353 387 398 497*   < > = values in this interval not displayed.   Basic Metabolic Panel: Recent Labs  Lab 06/02/19 2235 06/03/19 0212 06/03/19 [id number] 06/03/19 0613 06/04/19 0519 06/05/19 0616 06/06/19 0032 06/07/19 0536 06/08/19 0001  NA 134*  --  136  --   --  135  --   --  134*  K 3.7  --  3.5  --   --  4.4  --   --  4.1  CL 99  --  99  --   --  100  --   --  96*  CO2 24  --  23  --   --  23  --   --  26  GLUCOSE 131*  --  147*  --   --  126*  --   --  160*  BUN 9  --  8  --   --  15  --   --  23*  CREATININE 0.82   < > 0.76   < > 0.82 0.86 0.78 0.70 0.75  CALCIUM 9.1  --  8.7*  --   --  8.9  --   --  9.2   < > = values in this interval not displayed.   GFR: Estimated Creatinine Clearance: 130.4 mL/min (by C-G formula based on SCr of 0.75 mg/dL). Liver Function Tests: Recent Labs  Lab 06/02/19 2235 06/05/19 0616  AST 28 27  ALT 21 24  ALKPHOS 99 103  BILITOT 0.3 0.4  PROT 8.0 7.3  ALBUMIN 3.1* 2.8*   No results for input(s): LIPASE, AMYLASE in the last 168 hours. No results for input(s): AMMONIA in the last 168 hours. Coagulation Profile: No results for input(s): INR, PROTIME in the last 168 hours. Cardiac Enzymes: No results for input(s): CKTOTAL, CKMB, CKMBINDEX, TROPONINI in the last 168 hours. BNP (last 3 results) No results for input(s): PROBNP in the last 8760 hours. HbA1C: No results for input(s): HGBA1C in the last 72 hours. CBG: No results for input(s): GLUCAP in the last 168 hours. Lipid Profile: No results for input(s): CHOL, HDL, LDLCALC, TRIG, CHOLHDL, LDLDIRECT in the last 72 hours. Thyroid Function Tests: No results for input(s): TSH, T4TOTAL,  FREET4, T3FREE, THYROIDAB in the last 72 hours. Anemia Panel: No results for input(s): VITAMINB12, FOLATE, FERRITIN, TIBC, IRON, RETICCTPCT in the last 72 hours. Sepsis Labs: Recent Labs  Lab 06/02/19 2235 06/03/19 0035 06/03/19 0212 06/03/19 0613 06/04/19 0519  PROCALCITON  --   --  0.14 0.16 <0.10  LATICACIDVEN 1.2 1.1  --   --   --     Recent Results (from the past 240 hour(s))  Blood culture (routine x 2)     Status: Abnormal   Collection Time: 06/02/19 10:45 PM   Specimen: BLOOD  Result Value Ref Range Status   Specimen Description   Final    BLOOD LEFT ANTECUBITAL Performed at [facility] [address], [place], [place] [address]    Special Requests   Final    BOTTLES DRAWN AEROBIC AND ANAEROBIC Blood Culture adequate volume   Culture  Setup Time   Final    GRAM POSITIVE COCCI IN CLUSTERS IN BOTH AEROBIC AND ANAEROBIC BOTTLES CRITICAL RESULT CALLED TO, READ BACK BY AND VERIFIED WITH: Shelda Jakes PHARMD 1640 06/03/19 A BROWNING    Culture (A)  Final    STAPHYLOCOCCUS AUREUS SUSCEPTIBILITIES PERFORMED ON PREVIOUS CULTURE WITHIN THE LAST 5 DAYS. Performed at [facility], [place] [address] [place], [place] [address]    Report Status 06/05/2019 FINAL  Final  Blood culture (routine x 2)     Status: Abnormal   Collection Time: 06/03/19 12:05 AM   Specimen: BLOOD  Result Value Ref Range Status   Specimen Description   Final    BLOOD RIGHT ANTECUBITAL Performed at [facility], [place] [address] [place], [place] [address]    Special Requests   Final    BOTTLES DRAWN AEROBIC AND ANAEROBIC Blood Culture adequate volume Performed at [facility] [address] [place], [place] [address]    Culture  Setup Time   Final    GRAM POSITIVE COCCI IN CLUSTERS IN BOTH AEROBIC AND ANAEROBIC BOTTLES CRITICAL RESULT CALLED TO, READ BACK BY AND VERIFIED WITH: J LEGGE PHARMD 2200 06/03/19 A BROWNING Performed at [facility],  [place] [address], [place], [place] [address]    Culture METHICILLIN RESISTANT STAPHYLOCOCCUS  AUREUS (A)  Final   Report Status 06/05/2019 FINAL  Final   Organism ID, Bacteria METHICILLIN RESISTANT STAPHYLOCOCCUS AUREUS  Final      Susceptibility   Methicillin resistant staphylococcus aureus - MIC*    CIPROFLOXACIN >=8 RESISTANT Resistant     ERYTHROMYCIN >=8 RESISTANT Resistant     GENTAMICIN <=0.5 SENSITIVE Sensitive     OXACILLIN >=4 RESISTANT Resistant     TETRACYCLINE <=1 SENSITIVE Sensitive     VANCOMYCIN 1 SENSITIVE Sensitive     TRIMETH/SULFA <=10 SENSITIVE Sensitive     CLINDAMYCIN <=0.25 SENSITIVE Sensitive     RIFAMPIN <=0.5 SENSITIVE Sensitive     Inducible Clindamycin NEGATIVE Sensitive     * METHICILLIN RESISTANT STAPHYLOCOCCUS AUREUS  Blood Culture ID Panel (Reflexed)     Status: Abnormal   Collection Time: 06/03/19 12:05 AM  Result Value Ref Range Status   Enterococcus species NOT DETECTED NOT DETECTED Final   Listeria monocytogenes NOT DETECTED NOT DETECTED Final   Staphylococcus species DETECTED (A) NOT DETECTED Final    Comment: CRITICAL RESULT CALLED TO, READ BACK BY AND VERIFIED WITH: J LEGGE PHARMD 2200 06/03/19 A BROWNING    Staphylococcus aureus (BCID) DETECTED (A) NOT DETECTED Final    Comment: Methicillin (oxacillin)-resistant Staphylococcus aureus (MRSA). MRSA is predictably resistant to beta-lactam antibiotics (except ceftaroline). Preferred therapy is vancomycin unless clinically contraindicated. Patient requires contact precautions if  hospitalized. CRITICAL RESULT CALLED TO, READ BACK BY AND VERIFIED WITH: J LEGGE PHARMD 2200 06/03/19 A BROWNING    Methicillin resistance DETECTED (A) NOT DETECTED Final    Comment: CRITICAL RESULT CALLED TO, READ BACK BY AND VERIFIED WITH: J LEGGE PHARMD 2200 06/03/19 A BROWNING    Streptococcus species NOT DETECTED NOT DETECTED Final   Streptococcus agalactiae NOT DETECTED NOT DETECTED Final   Streptococcus pneumoniae NOT  DETECTED NOT DETECTED Final   Streptococcus pyogenes NOT DETECTED NOT DETECTED Final   Acinetobacter baumannii NOT DETECTED NOT DETECTED Final   Enterobacteriaceae species NOT DETECTED NOT DETECTED Final   Enterobacter cloacae complex NOT DETECTED NOT DETECTED Final   Escherichia coli NOT DETECTED NOT DETECTED Final   Klebsiella oxytoca NOT DETECTED NOT DETECTED Final   Klebsiella pneumoniae NOT DETECTED NOT DETECTED Final   Proteus species NOT DETECTED NOT DETECTED Final   Serratia marcescens NOT DETECTED NOT DETECTED Final   Haemophilus influenzae NOT DETECTED NOT DETECTED Final   Neisseria meningitidis NOT DETECTED NOT DETECTED Final   Pseudomonas aeruginosa NOT DETECTED NOT DETECTED Final   Candida albicans NOT DETECTED NOT DETECTED Final   Candida glabrata NOT DETECTED NOT DETECTED Final   Candida krusei NOT DETECTED NOT DETECTED Final   Candida parapsilosis NOT DETECTED NOT DETECTED Final   Candida tropicalis NOT DETECTED NOT DETECTED Final    Comment: Performed at [facility], [place]. [address], [place], [place] [address]  SARS CORONAVIRUS 2 (TAT 6-24 HRS) Nasopharyngeal Nasopharyngeal Swab     Status: None   Collection Time: 06/03/19 12:35 AM   Specimen: Nasopharyngeal Swab  Result Value Ref Range Status   SARS Coronavirus 2 NEGATIVE NEGATIVE Final    Comment: (NOTE) SARS-CoV-2 target nucleic acids are NOT DETECTED. The SARS-CoV-2 RNA is generally detectable in upper and lower respiratory specimens during the acute phase of infection. Negative results do not preclude SARS-CoV-2 infection, do not rule out co-infections with other pathogens, and should not be used as the sole basis for treatment or other patient management decisions. Negative results must be combined with clinical observations,  patient history, and epidemiological information. The expected result is Negative. Fact Sheet for Patients: SugarRoll.be Fact Sheet for Healthcare  Providers: https://www.woods-.com/ This test is not yet approved or cleared by the [place] FDA and  has been authorized for detection and/or diagnosis of SARS-CoV-2 by FDA under an Emergency Use Authorization (EUA). This EUA will remain  in effect (meaning this test can be used) for the duration of the COVID-19 declaration under Section 56 4(b)(1) of the Act, 21 U.S.C. section 360bbb-3(b)(1), unless the authorization is terminated or revoked sooner. Performed at [facility], [place] [address] [place], [place] [address]   Expectorated sputum assessment w rflx to resp cult     Status: None   Collection Time: 06/03/19  4:41 AM   Specimen: Expectorated Sputum  Result Value Ref Range Status   Specimen Description EXPECTORATED SPUTUM  Final   Special Requests NONE  Final   Sputum evaluation   Final    Sputum specimen not acceptable for testing.  Please recollect.   NOFIFIED [facility] AT [id number] ON 06/03/19 BY A,MOHAMED Performed at [facility], [place] [address], [place], [place] [address]    Report Status 06/03/2019 FINAL  Final  Respiratory Panel by PCR     Status: None   Collection Time: 06/03/19 10:12 AM   Specimen: Nasopharyngeal Swab; Respiratory  Result Value Ref Range Status   Adenovirus NOT DETECTED NOT DETECTED Final   Coronavirus 229E NOT DETECTED NOT DETECTED Final    Comment: (NOTE) The Coronavirus on the Respiratory Panel, DOES NOT test for the novel  Coronavirus (2019 nCoV)    Coronavirus HKU1 NOT DETECTED NOT DETECTED Final   Coronavirus NL63 NOT DETECTED NOT DETECTED Final   Coronavirus OC43 NOT DETECTED NOT DETECTED Final   Metapneumovirus NOT DETECTED NOT DETECTED Final   Rhinovirus / Enterovirus NOT DETECTED NOT DETECTED Final   Influenza A NOT DETECTED NOT DETECTED Final   Influenza B NOT DETECTED NOT DETECTED Final   Parainfluenza Virus 1 NOT DETECTED NOT DETECTED Final   Parainfluenza Virus 2 NOT DETECTED NOT DETECTED  Final   Parainfluenza Virus 3 NOT DETECTED NOT DETECTED Final   Parainfluenza Virus 4 NOT DETECTED NOT DETECTED Final   Respiratory Syncytial Virus NOT DETECTED NOT DETECTED Final   Bordetella pertussis NOT DETECTED NOT DETECTED Final   Chlamydophila pneumoniae NOT DETECTED NOT DETECTED Final   Mycoplasma pneumoniae NOT DETECTED NOT DETECTED Final    Comment: Performed at [facility], [place]. [address] [place], [place] [address]  Culture, respiratory     Status: None   Collection Time: 06/03/19  4:40 PM   Specimen: SPU  Result Value Ref Range Status   Specimen Description   Final    SPUTUM Performed at [facility] [address], [place], [place] [address]    Special Requests   Final    NONE Performed at [facility], [place] [address], [place], [place] [address]    Gram Stain   Final    RARE WBC PRESENT, PREDOMINANTLY PMN RARE GRAM POSITIVE COCCI IN PAIRS RARE BUDDING YEAST SEEN Performed at [facility], [place] [address] [place], [place] [address]    Culture   Final    FEW CANDIDA TROPICALIS RARE ENTEROCOCCUS FAECALIS    Report Status 06/07/2019 FINAL  Final   Organism ID, Bacteria ENTEROCOCCUS FAECALIS  Final      Susceptibility   Enterococcus faecalis - MIC*    AMPICILLIN <=  2 SENSITIVE Sensitive     VANCOMYCIN 1 SENSITIVE Sensitive     GENTAMICIN SYNERGY SENSITIVE Sensitive     * RARE ENTEROCOCCUS FAECALIS  MRSA PCR Screening     Status: None   Collection Time: 06/03/19  8:11 PM   Specimen: Nasopharyngeal  Result Value Ref Range Status   MRSA by PCR NEGATIVE NEGATIVE Final    Comment:        The GeneXpert MRSA Assay (FDA approved for NASAL specimens only), is one component of a comprehensive MRSA colonization surveillance program. It is not intended to diagnose MRSA infection nor to guide or monitor treatment for MRSA infections. Performed at [facility], [place] [address], [place], [place]  [address]   Culture, blood (Routine X 2) w Reflex to ID Panel     Status: None (Preliminary result)   Collection Time: 06/05/19 10:43 AM   Specimen: BLOOD RIGHT HAND  Result Value Ref Range Status   Specimen Description   Final    BLOOD RIGHT HAND Performed at [facility] [address] [place], [place] [address]    Special Requests   Final    BOTTLES DRAWN AEROBIC AND ANAEROBIC Blood Culture adequate volume Performed at [facility] [address], [place], [place] [address]    Culture   Final    NO GROWTH 4 DAYS Performed at [facility], [place] [address] [place], [place] [address]    Report Status PENDING  Incomplete  Culture, blood (Routine X 2) w Reflex to ID Panel     Status: None (Preliminary result)   Collection Time: 06/05/19 10:43 AM   Specimen: BLOOD LEFT HAND  Result Value Ref Range Status   Specimen Description   Final    BLOOD LEFT HAND Performed at [facility] [address] [place], [place] [address]    Special Requests   Final    BOTTLES DRAWN AEROBIC AND ANAEROBIC Blood Culture adequate volume Performed at [facility] [address], [place], [place] [address]    Culture   Final    NO GROWTH 4 DAYS Performed at [facility], [place] [address] [place], [place] [address]    Report Status PENDING  Incomplete         Radiology Studies: DG Chest 1 View  Result Date: 06/09/2019 CLINICAL DATA:  Hypoxemia EXAM: CHEST  1 VIEW COMPARISON:  Radiograph 06/06/2019, CT 06/06/2019 FINDINGS: Cardiac silhouette is enlarged. Thoracotomy defect with removal of LEFT posterior ribs. LEFT basilar atelectasis unchanged. Diffuse fine airspace disease throughout the lungs similar prior. PICC line with tip in distal SVC. IMPRESSION: 1. Diffuse airspace disease favoring pulmonary edema. 2. LEFT lower lobe atelectasis and effusion. 3. Postsurgical change in the LEFT hemithorax. Electronically  Signed   By: Suzy Bouchard M.D.   On: 06/09/2019 09:42        Scheduled Meds: . amitriptyline  150 mg Oral QHS  . aspirin EC  325 mg Oral Daily  . Chlorhexidine Gluconate Cloth  6 each Topical Daily  . citalopram  20 mg Oral Q lunch  . furosemide  40 mg Intravenous BID  . gabapentin  300 mg Oral TID  . Ipratropium-Albuterol  2 puff Inhalation BID  . methylPREDNISolone (SOLU-MEDROL) injection  60 mg Intravenous Q8H  . polyethylene glycol  17 g Oral Daily  . potassium chloride  10 mEq Oral BID  . rosuvastatin  40 mg Oral Daily  . sodium  chloride flush  10-40 mL Intracatheter Q12H  . cholecalciferol  5,000 Units Oral Daily   Continuous Infusions: . Vancomycin 750 mg (06/09/19 0508)     LOS: 6 days     Georgette Shell, MD [facility]  If 7PM-7AM, please contact night-coverage [email or web address] Password [facility] 06/09/2019, 12:25 PM

## 2019-06-10 ENCOUNTER — Inpatient Hospital Stay (HOSPITAL_COMMUNITY): Payer: Medicare Other

## 2019-06-10 LAB — CULTURE, BLOOD (ROUTINE X 2)
Culture: NO GROWTH
Culture: NO GROWTH
Special Requests: ADEQUATE
Special Requests: ADEQUATE

## 2019-06-10 NOTE — Progress Notes (Signed)
PROGRESS NOTE    Howard Fox  [id number] DOB: [birth year] DOA: 06/02/2019 PCP: Unk Pinto, MD   Brief Narrative: 60 year old male with history of HTN, HLD, diabetes, chronic pain syndrome, remote history of IVDU, history of vertebral osteomyelitis on chronic suppressive doxycycline, depression, hep C status post recent who came to the ED for persistent and worsening shortness of breath and associated fever&chills.Symptoms have been present for the past couple of days, progressive, and came to the hospital. His symptoms remind him of his pneumonia from last year. In the ED he was febrile, tachypneic, hypoxic requiring supplemental oxygen. COVID-19 was negative. He was admitted to the hospital and placed on broad-spectrum antibiotics for Pneumonia and after admission his blood cultures showed MRSA bacteremia.  06/05/2019-he is resting in bed anxious to go home.Saturation dropped to 77% overnight when he took off his oxygen accidentally in his sleep. Currently he is on 6 L of oxygen to keep saturation above 92%.  06/07/2019 patient's oxygen requirement went up to 15 L overnight.  06/08/2019 patient on 13 L of high flow nasal cannula. Reports that he slept well. Does not appear to be in any distress.  1/17- desaturated to  78 on 10 lit placed back on 15 lit he is refusing nebulizer treatments reporting its making him sick Stat chest x-ray done shows evidence of pulmonary edema likely some left pleural effusi  Assessment & Plan:   Principal Problem:   Community acquired pneumonia Active Problems:   Bipolar disorder ([place])   Essential hypertension   Hyperlipidemia, mixed   Vitamin D deficiency   MRSA bacteremia   Vertebral osteomyelitis (HCC)   Fever   Dyspnea   Chronic pain syndrome   Pneumonia  #1 acute hypoxic respiratory failure multifactorial -COPD exacerbation/pulmonary edema/pneumonia -patient really has not made much improvement remains on high flow  nasal cannula though he does not appear to be in any distress.. Patient was initially treated with Rocephin azithromycin and Vanco. Continue IV Solu-Medrol  Chest CT with groundglass opacities edema versus inflammation versus infection. ID has been consulted for MRSA bacteremia and narrowed antibiotics down to vancomycin. Increase Lasix to 40 mg twice a day.  Positive by 363 which is down from 790 after starting Lasix twice a day. He is on 5 L of oxygen which is down from 4 L yesterday.  However he gets easily hypoxic with tapering down. Repeat chest x-ray.  Echo shows normal ejection fraction.Left ventricular ejection fraction, by visual estimation, is 55 to 60%. The left ventricle has normal function. There is no left ventricular hypertrophy. Left ventricular diastolic parameters are indeterminate. The left ventricle has no regional wall motion abnormalities. Global right ventricle has normal systolic function.The right ventricular size is normal. No increase in right ventricular wall thickness. Left atrial size was normal. Right atrial size was normal. The mitral valve is normal in structure. No evidence of mitral valve regurgitation. No evidence of mitral stenosis. The tricuspid valve is normal in structure. The aortic valve is normal in structure. Aortic valve regurgitation is not visualized. No evidence of aortic valve sclerosis or stenosis. The pulmonic valve was normal in structure. Pulmonic valve regurgitation is not visualized. The inferior vena cava is normal in size with greater than 50% respiratory variability, suggesting right atrial pressure of 3 mmHg.  #2 MRSA bacteremia with history of T-spine discitis-patient stopped doxy as an outpatient. Now on IV Vanco per ID. MRI of the thoracolumbar spine shows no evidence of osteomyelitis.PICC line placed1/15/2021 right upper extremity.  #  3 history of hepatitis C patient received treatment and tested negative.  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   Weight as of this encounter: 109.8 kg.  DVT prophylaxis:Lovenox Code Status:Full code  family Communication:Discussed with his mother Bonnita Nasuti [id number]. Disposition Planpatient is being treated with vancomycin for MRSA bacteremia still with ongoing work-up still with leukocytosis and significant hypoxia and fever Consultants:  ID, cardiology  Procedures:None  Antimicrobials:Vancomycin  Subjective: Anxious to go home had a lot of questions which were all answered discussed with his mother  Objective: Vitals:   06/10/19 0628 06/10/19 0821 06/10/19 1012 06/10/19 1454  BP: (!) 143/86   128/86  Pulse: 77   84  Resp: 20   20  Temp: 97.7 F (36.5 C)   98.1 F (36.7 C)  TempSrc: Oral     SpO2: 93% (!) 81% 92% 94%  Weight:      Height:        Intake/Output Summary (Last 24 hours) at 06/10/2019 1606 Last data filed at 06/10/2019 1248 Gross per 24 hour  Intake -  Output 900 ml  Net -900 ml   Filed Weights   06/02/19 2232  Weight: 109.8 kg    Examination:  General exam: Appears calm and comfortable  Respiratory system: Decreased breath sounds at the bases to auscultation. Respiratory effort normal. Cardiovascular system: S1 & S2 heard, RRR. No JVD, murmurs, rubs, gallops or clicks. No pedal edema. Gastrointestinal system: Abdomen is nondistended, soft and nontender. No organomegaly or masses felt. Normal bowel sounds heard. Central nervous system: Alert and oriented. No focal neurological deficits. Extremities: Symmetric 5 x 5 power. Skin: No rashes, lesions or ulcers Psychiatry: Judgement and insight appear normal. Mood & affect appropriate.     Data Reviewed: I have personally reviewed  following labs and imaging studies  CBC: Recent Labs  Lab 06/05/19 0616 06/06/19 0944 06/08/19 0001  WBC 14.3* 18.9* 17.4*  HGB 12.2* 12.4* 11.6*  HCT 38.2* 38.0* 35.7*  MCV 90.3 88.2 88.1  PLT 387 398 [id number]*   Basic Metabolic Panel: Recent Labs  Lab 06/04/19 0519 06/05/19 0616 06/06/19 0032 06/07/19 0536 06/08/19 0001  NA  --  135  --   --  134*  K  --  4.4  --   --  4.1  CL  --  100  --   --  96*  CO2  --  23  --   --  26  GLUCOSE  --  126*  --   --  160*  BUN  --  15  --   --  23*  CREATININE 0.82 0.86 0.78 0.70 0.75  CALCIUM  --  8.9  --   --  9.2   GFR: Estimated Creatinine Clearance: 130.4 mL/min (by C-G formula based on SCr of 0.75 mg/dL). Liver Function Tests: Recent Labs  Lab 06/05/19 0616  AST 27  ALT 24  ALKPHOS 103  BILITOT 0.4  PROT 7.3  ALBUMIN 2.8*   No results for input(s): LIPASE, AMYLASE in the last 168 hours. No results for input(s): AMMONIA in the last 168 hours. Coagulation Profile: No results for input(s): INR, PROTIME in the last 168 hours. Cardiac Enzymes: No results for input(s): CKTOTAL, CKMB, CKMBINDEX, TROPONINI  in the last 168 hours. BNP (last 3 results) No results for input(s): PROBNP in the last 8760 hours. HbA1C: No results for input(s): HGBA1C in the last 72 hours. CBG: No results for input(s): GLUCAP in the last 168 hours. Lipid Profile: No results for input(s): CHOL, HDL, LDLCALC, TRIG, CHOLHDL, LDLDIRECT in the last 72 hours. Thyroid Function Tests: No results for input(s): TSH, T4TOTAL, FREET4, T3FREE, THYROIDAB in the last 72 hours. Anemia Panel: No results for input(s): VITAMINB12, FOLATE, FERRITIN, TIBC, IRON, RETICCTPCT in the last 72 hours. Sepsis Labs: Recent Labs  Lab 06/04/19 0519  PROCALCITON <0.10    Recent Results (from the past 240 hour(s))  Blood culture (routine x 2)     Status: Abnormal   Collection Time: 06/02/19 10:45 PM   Specimen: BLOOD  Result Value Ref Range Status   Specimen Description    Final    BLOOD LEFT ANTECUBITAL Performed at [facility] [address] [place], [place] [address]    Special Requests   Final    BOTTLES DRAWN AEROBIC AND ANAEROBIC Blood Culture adequate volume   Culture  Setup Time   Final    GRAM POSITIVE COCCI IN CLUSTERS IN BOTH AEROBIC AND ANAEROBIC BOTTLES CRITICAL RESULT CALLED TO, READ BACK BY AND VERIFIED WITH: Shelda Jakes PHARMD 1640 06/03/19 A BROWNING    Culture (A)  Final    STAPHYLOCOCCUS AUREUS SUSCEPTIBILITIES PERFORMED ON PREVIOUS CULTURE WITHIN THE LAST 5 DAYS. Performed at [facility], [place] [address], [place], [place] [address]    Report Status 06/05/2019 FINAL  Final  Blood culture (routine x 2)     Status: Abnormal   Collection Time: 06/03/19 12:05 AM   Specimen: BLOOD  Result Value Ref Range Status   Specimen Description   Final    BLOOD RIGHT ANTECUBITAL Performed at [facility], [place] [address], [place], [place] [address]    Special Requests   Final    BOTTLES DRAWN AEROBIC AND ANAEROBIC Blood Culture adequate volume Performed at [facility] [address], [place], [place] [address]    Culture  Setup Time   Final    GRAM POSITIVE COCCI IN CLUSTERS IN BOTH AEROBIC AND ANAEROBIC BOTTLES CRITICAL RESULT CALLED TO, READ BACK BY AND VERIFIED WITH: J LEGGE PHARMD 2200 06/03/19 A BROWNING Performed at [facility], [place] [address], [place], [place] [address]    Culture METHICILLIN RESISTANT STAPHYLOCOCCUS AUREUS (A)  Final   Report Status 06/05/2019 FINAL  Final   Organism ID, Bacteria METHICILLIN RESISTANT STAPHYLOCOCCUS AUREUS  Final      Susceptibility   Methicillin resistant staphylococcus aureus - MIC*    CIPROFLOXACIN >=8 RESISTANT Resistant     ERYTHROMYCIN >=8 RESISTANT Resistant     GENTAMICIN <=0.5 SENSITIVE Sensitive     OXACILLIN >=4 RESISTANT Resistant     TETRACYCLINE <=1 SENSITIVE Sensitive     VANCOMYCIN 1 SENSITIVE Sensitive     TRIMETH/SULFA  <=10 SENSITIVE Sensitive     CLINDAMYCIN <=0.25 SENSITIVE Sensitive     RIFAMPIN <=0.5 SENSITIVE Sensitive     Inducible Clindamycin NEGATIVE Sensitive     * METHICILLIN RESISTANT STAPHYLOCOCCUS AUREUS  Blood Culture ID Panel (Reflexed)     Status: Abnormal   Collection Time: 06/03/19 12:05 AM  Result Value Ref Range Status   Enterococcus species NOT DETECTED NOT DETECTED Final   Listeria monocytogenes NOT DETECTED NOT DETECTED Final   Staphylococcus species DETECTED (A) NOT DETECTED Final  Comment: CRITICAL RESULT CALLED TO, READ BACK BY AND VERIFIED WITH: J LEGGE PHARMD 2200 06/03/19 A BROWNING    Staphylococcus aureus (BCID) DETECTED (A) NOT DETECTED Final    Comment: Methicillin (oxacillin)-resistant Staphylococcus aureus (MRSA). MRSA is predictably resistant to beta-lactam antibiotics (except ceftaroline). Preferred therapy is vancomycin unless clinically contraindicated. Patient requires contact precautions if  hospitalized. CRITICAL RESULT CALLED TO, READ BACK BY AND VERIFIED WITH: J LEGGE PHARMD 2200 06/03/19 A BROWNING    Methicillin resistance DETECTED (A) NOT DETECTED Final    Comment: CRITICAL RESULT CALLED TO, READ BACK BY AND VERIFIED WITH: J LEGGE PHARMD 2200 06/03/19 A BROWNING    Streptococcus species NOT DETECTED NOT DETECTED Final   Streptococcus agalactiae NOT DETECTED NOT DETECTED Final   Streptococcus pneumoniae NOT DETECTED NOT DETECTED Final   Streptococcus pyogenes NOT DETECTED NOT DETECTED Final   Acinetobacter baumannii NOT DETECTED NOT DETECTED Final   Enterobacteriaceae species NOT DETECTED NOT DETECTED Final   Enterobacter cloacae complex NOT DETECTED NOT DETECTED Final   Escherichia coli NOT DETECTED NOT DETECTED Final   Klebsiella oxytoca NOT DETECTED NOT DETECTED Final   Klebsiella pneumoniae NOT DETECTED NOT DETECTED Final   Proteus species NOT DETECTED NOT DETECTED Final   Serratia marcescens NOT DETECTED NOT DETECTED Final   Haemophilus  influenzae NOT DETECTED NOT DETECTED Final   Neisseria meningitidis NOT DETECTED NOT DETECTED Final   Pseudomonas aeruginosa NOT DETECTED NOT DETECTED Final   Candida albicans NOT DETECTED NOT DETECTED Final   Candida glabrata NOT DETECTED NOT DETECTED Final   Candida krusei NOT DETECTED NOT DETECTED Final   Candida parapsilosis NOT DETECTED NOT DETECTED Final   Candida tropicalis NOT DETECTED NOT DETECTED Final    Comment: Performed at [facility], [place]. [address], [place], [place] [address]  SARS CORONAVIRUS 2 (TAT 6-24 HRS) Nasopharyngeal Nasopharyngeal Swab     Status: None   Collection Time: 06/03/19 12:35 AM   Specimen: Nasopharyngeal Swab  Result Value Ref Range Status   SARS Coronavirus 2 NEGATIVE NEGATIVE Final    Comment: (NOTE) SARS-CoV-2 target nucleic acids are NOT DETECTED. The SARS-CoV-2 RNA is generally detectable in upper and lower respiratory specimens during the acute phase of infection. Negative results do not preclude SARS-CoV-2 infection, do not rule out co-infections with other pathogens, and should not be used as the sole basis for treatment or other patient management decisions. Negative results must be combined with clinical observations, patient history, and epidemiological information. The expected result is Negative. Fact Sheet for Patients: SugarRoll.be Fact Sheet for Healthcare Providers: https://www.woods-.com/ This test is not yet approved or cleared by the [place] FDA and  has been authorized for detection and/or diagnosis of SARS-CoV-2 by FDA under an Emergency Use Authorization (EUA). This EUA will remain  in effect (meaning this test can be used) for the duration of the COVID-19 declaration under Section 56 4(b)(1) of the Act, 21 U.S.C. section 360bbb-3(b)(1), unless the authorization is terminated or revoked sooner. Performed at [facility], [place] [address], [place], [place]  [address]   Expectorated sputum assessment w rflx to resp cult     Status: None   Collection Time: 06/03/19  4:41 AM   Specimen: Expectorated Sputum  Result Value Ref Range Status   Specimen Description EXPECTORATED SPUTUM  Final   Special Requests NONE  Final   Sputum evaluation   Final    Sputum specimen not acceptable for testing.  Please recollect.   NOFIFIED [facility]  AT [id number] ON 06/03/19 BY A,MOHAMED Performed at [facility], [place] [address] [place], [place] [address]    Report Status 06/03/2019 FINAL  Final  Respiratory Panel by PCR     Status: None   Collection Time: 06/03/19 10:12 AM   Specimen: Nasopharyngeal Swab; Respiratory  Result Value Ref Range Status   Adenovirus NOT DETECTED NOT DETECTED Final   Coronavirus 229E NOT DETECTED NOT DETECTED Final    Comment: (NOTE) The Coronavirus on the Respiratory Panel, DOES NOT test for the novel  Coronavirus (2019 nCoV)    Coronavirus HKU1 NOT DETECTED NOT DETECTED Final   Coronavirus NL63 NOT DETECTED NOT DETECTED Final   Coronavirus OC43 NOT DETECTED NOT DETECTED Final   Metapneumovirus NOT DETECTED NOT DETECTED Final   Rhinovirus / Enterovirus NOT DETECTED NOT DETECTED Final   Influenza A NOT DETECTED NOT DETECTED Final   Influenza B NOT DETECTED NOT DETECTED Final   Parainfluenza Virus 1 NOT DETECTED NOT DETECTED Final   Parainfluenza Virus 2 NOT DETECTED NOT DETECTED Final   Parainfluenza Virus 3 NOT DETECTED NOT DETECTED Final   Parainfluenza Virus 4 NOT DETECTED NOT DETECTED Final   Respiratory Syncytial Virus NOT DETECTED NOT DETECTED Final   Bordetella pertussis NOT DETECTED NOT DETECTED Final   Chlamydophila pneumoniae NOT DETECTED NOT DETECTED Final   Mycoplasma pneumoniae NOT DETECTED NOT DETECTED Final    Comment: Performed at [facility], [place]. [address] [place], [place] [address]  Culture, respiratory     Status: None   Collection Time: 06/03/19  4:40 PM   Specimen: SPU  Result Value  Ref Range Status   Specimen Description   Final    SPUTUM Performed at [facility] [address], [place], [place] [address]    Special Requests   Final    NONE Performed at [facility], [place] [address] [place], [place] [address]    Gram Stain   Final    RARE WBC PRESENT, PREDOMINANTLY PMN RARE GRAM POSITIVE COCCI IN PAIRS RARE BUDDING YEAST SEEN Performed at [facility], [place] [address], [place], [place] [address]    Culture   Final    FEW CANDIDA TROPICALIS RARE ENTEROCOCCUS FAECALIS    Report Status 06/07/2019 FINAL  Final   Organism ID, Bacteria ENTEROCOCCUS FAECALIS  Final      Susceptibility   Enterococcus faecalis - MIC*    AMPICILLIN <=2 SENSITIVE Sensitive     VANCOMYCIN 1 SENSITIVE Sensitive     GENTAMICIN SYNERGY SENSITIVE Sensitive     * RARE ENTEROCOCCUS FAECALIS  MRSA PCR Screening     Status: None   Collection Time: 06/03/19  8:11 PM   Specimen: Nasopharyngeal  Result Value Ref Range Status   MRSA by PCR NEGATIVE NEGATIVE Final    Comment:        The GeneXpert MRSA Assay (FDA approved for NASAL specimens only), is one component of a comprehensive MRSA colonization surveillance program. It is not intended to diagnose MRSA infection nor to guide or monitor treatment for MRSA infections. Performed at [facility], [place] [address], [place],  [address]   Culture, blood (Routine X 2) w Reflex to ID Panel     Status: None   Collection Time: 06/05/19 10:43 AM   Specimen: BLOOD RIGHT HAND  Result Value Ref Range Status   Specimen Description   Final    BLOOD RIGHT HAND Performed at [facility],  [place] [address], [place], [place] [address]    Special Requests   Final    BOTTLES DRAWN AEROBIC AND ANAEROBIC Blood Culture adequate volume Performed at [facility] [address], [place], [place] [address]    Culture   Final    NO GROWTH 5 DAYS  Performed at [facility], [place] [address], [place], [place] [address]    Report Status 06/10/2019 FINAL  Final  Culture, blood (Routine X 2) w Reflex to ID Panel     Status: None   Collection Time: 06/05/19 10:43 AM   Specimen: BLOOD LEFT HAND  Result Value Ref Range Status   Specimen Description   Final    BLOOD LEFT HAND Performed at [facility] [address], [place], [place] [address]    Special Requests   Final    BOTTLES DRAWN AEROBIC AND ANAEROBIC Blood Culture adequate volume Performed at [facility] [address], [place], [place] [address]    Culture   Final    NO GROWTH 5 DAYS Performed at [facility], [place] [address], [place], [place] [address]    Report Status 06/10/2019 FINAL  Final         Radiology Studies: DG Chest 1 View  Result Date: 06/09/2019 CLINICAL DATA:  Hypoxemia EXAM: CHEST  1 VIEW COMPARISON:  Radiograph 06/06/2019, CT 06/06/2019 FINDINGS: Cardiac silhouette is enlarged. Thoracotomy defect with removal of LEFT posterior ribs. LEFT basilar atelectasis unchanged. Diffuse fine airspace disease throughout the lungs similar prior. PICC line with tip in distal SVC. IMPRESSION: 1. Diffuse airspace disease favoring pulmonary edema. 2. LEFT lower lobe atelectasis and effusion. 3. Postsurgical change in the LEFT hemithorax. Electronically Signed   By: Suzy Bouchard M.D.   On: 06/09/2019 09:42        Scheduled Meds: . amitriptyline  150 mg Oral QHS  . aspirin EC  325 mg Oral Daily  . Chlorhexidine Gluconate Cloth  6 each Topical Daily  . citalopram  20 mg Oral Q lunch  . furosemide  40 mg Intravenous BID  . gabapentin  300 mg Oral TID  . Ipratropium-Albuterol  2 puff Inhalation BID  . methylPREDNISolone (SOLU-MEDROL) injection  60 mg Intravenous Q8H  . polyethylene glycol  17 g Oral Daily  . potassium chloride  10 mEq Oral BID  . rosuvastatin  40 mg Oral Daily  . sodium chloride flush  10-40  mL Intracatheter Q12H  . cholecalciferol  5,000 Units Oral Daily   Continuous Infusions: . Vancomycin 750 mg (06/10/19 1315)     LOS: 7 days     Georgette Shell, MD [facility]  If 7PM-7AM, please contact night-coverage [email or web address] Password [facility] 06/10/2019, 4:06 PM

## 2019-06-10 NOTE — Progress Notes (Signed)
[facility]    Date of Admission:  06/02/2019   Total days of antibiotics 9/day 8 vanco           ID: Howard Fox is a 59 y.o. male with MRSA bacteremia but also new onset respiratory symptoms unclear if MRSA pneumonia. Hx of mrsa discitis in may 2020- now fusion with HW placement 4 months ago. Hx of left partial pneumonectomy Principal Problem:   Community acquired pneumonia Active Problems:   Bipolar disorder ([place])   Essential hypertension   Hyperlipidemia, mixed   Vitamin D deficiency   MRSA bacteremia   Vertebral osteomyelitis (HCC)   Fever   Dyspnea   Chronic pain syndrome   Pneumonia    Subjective: Hypoxia without supplemental oxygen. Required increased [place] supl. O2 over the last 24hr. Now down to 5L  Lives with 4yo mother. She is not ill. They follow 3Ws  Medications:  . amitriptyline  150 mg Oral QHS  . aspirin EC  325 mg Oral Daily  . Chlorhexidine Gluconate Cloth  6 each Topical Daily  . citalopram  20 mg Oral Q lunch  . furosemide  40 mg Intravenous BID  . gabapentin  300 mg Oral TID  . Ipratropium-Albuterol  2 puff Inhalation BID  . methylPREDNISolone (SOLU-MEDROL) injection  60 mg Intravenous Q8H  . polyethylene glycol  17 g Oral Daily  . potassium chloride  10 mEq Oral BID  . rosuvastatin  40 mg Oral Daily  . sodium chloride flush  10-40 mL Intracatheter Q12H  . cholecalciferol  5,000 Units Oral Daily    Objective: Vital signs in last 24 hours: Temp:  [97.7 F (36.5 C)-98.3 F (36.8 C)] 97.7 F (36.5 C) (01/18 0628) Pulse Rate:  [77-86] 77 (01/18 0628) Resp:  [20-24] 20 (01/18 0628) BP: (135-143)/(79-86) 143/86 (01/18 0628) SpO2:  [81 %-96 %] 92 % (01/18 1012) Physical Exam  Constitutional: He is oriented to person, place, and time. He appears well-developed and well-nourished. No distress.  HENT:  Mouth/Throat: Oropharynx is clear and moist. No oropharyngeal exudate.  Cardiovascular: Normal rate, regular rhythm  and normal heart sounds. Exam reveals no gallop and no friction rub.  No murmur heard.  Pulmonary/Chest: Effort normal and breath sounds normal. No respiratory distress. He has no wheezes.  Abdominal: Soft. Bowel sounds are normal. He exhibits no distension. There is no tenderness.  Lymphadenopathy:  He has no cervical adenopathy.  Neurological: He is alert and oriented to person, place, and time.  Skin: Skin is warm and dry. No rash noted. No erythema.  Psychiatric: He has a normal mood and affect. His behavior is normal.     Lab Results Recent Labs    06/08/19 0001  WBC 17.4*  HGB 11.6*  HCT 35.7*  NA 134*  K 4.1  CL 96*  CO2 26  BUN 23*  CREATININE 0.75    Microbiology: Sputum cx: enterococcus and candida Blood cx : MRSA  Blood cx repeat on 1/13: NGTD Studies/Results: DG Chest 1 View  Result Date: 06/09/2019 CLINICAL DATA:  Hypoxemia EXAM: CHEST  1 VIEW COMPARISON:  Radiograph 06/06/2019, CT 06/06/2019 FINDINGS: Cardiac silhouette is enlarged. Thoracotomy defect with removal of LEFT posterior ribs. LEFT basilar atelectasis unchanged. Diffuse fine airspace disease throughout the lungs similar prior. PICC line with tip in distal SVC. IMPRESSION: 1. Diffuse airspace disease favoring pulmonary edema. 2. LEFT lower lobe atelectasis and effusion. 3. Postsurgical change in the LEFT hemithorax. Electronically Signed   By:  Suzy Bouchard M.D.   On: 06/09/2019 09:42     Assessment/Plan: Pulmonary symptoms, wrosening hypoxia = Echo doesn't suggest hx of cardiomyopathy, risk for Pulmonary edema. Patchy infiltrates over the course of hospitalization. Unclear if all symptoms are related to pulmonary edema vs pneumoni.a Trying to see if lasix will help symptoms. covid ruled out on admit.   Pneumonia = did not isolate MRSA though has been on vancomycin. Continue with vancomycin which would also cover enterococcus. Which could explain hypoxia.  mrsa bacteremia = currently on  vancomycin, blood cx have cleared bloodstream infection. Would like to get TEE if able to tolerate, if now then would treat for 6 wk, then can place back on chronic doxycycline.  Orders listed below: Diagnosis: Bacteremia and pneumonia  Culture Result: mrsa  Allergies  Allergen Reactions  . Codeine Itching  . Dilaudid [Hydromorphone Hcl] Itching  . Shellfish Allergy Diarrhea and Nausea And Vomiting    OPAT Orders Discharge antibiotics: Per pharmacy protocol  Aim for Vancomycin trough 15-20 or AUC 400-550 (unless otherwise indicated) Duration: 6 wk End Date: Day 1 is 1/13   [facility] Per Protocol:  Labs weekly while on IV antibiotics: _x_ CBC with differential _x_ BMP __ CMP __ CRP __ ESR _x_ Vancomycin trough   _x_ Please pull PIC at completion of IV antibiotics - at 6 wk __ Please leave PIC in place until doctor has seen patient or been notified  Fax weekly labs to [phone]  Clinic Follow Up Appt: In 5-6 wk  @ [facility] or [facility]  [facility] Cell: [phone] Pager: [phone]  06/10/2019, 2:42 PM

## 2019-06-10 NOTE — Progress Notes (Signed)
Earlier today PT had removed himself from 71- checked Sp02 was 81%. Discussed with PT the importance of 02 usage and PT placed himself back on 02.

## 2019-06-10 NOTE — Progress Notes (Signed)
PHARMACY CONSULT NOTE FOR:  OUTPATIENT  PARENTERAL ANTIBIOTIC THERAPY (OPAT)  Indication: MRSA bacteremia Regimen: vancomycin 750mg  IV q8h End date: 07/17/2019  IV antibiotic discharge orders are pended. To discharging provider:  please sign these orders via discharge navigator,  Select New Orders & click on the button choice - Manage This Unsigned Work.     Thank you for allowing pharmacy to be a part of this patient's care.  Doreene Eland, PharmD, BCPS.   Work Cell: [phone] 06/10/2019 4:42 PM

## 2019-06-10 NOTE — Progress Notes (Signed)
Attempted to wean patient from 5-4.5 liters of O2. Patient's sats dropped to 82%. Patient placed back to 5L.

## 2019-06-11 MED ORDER — ORAL CARE MOUTH RINSE
15.0000 mL | Freq: Two times a day (BID) | OROMUCOSAL | Status: DC
  Administered 2019-06-12: 09:00:00 15 mL via OROMUCOSAL

## 2019-06-11 MED ORDER — METHYLPREDNISOLONE SODIUM SUCC 125 MG IJ SOLR
60.0000 mg | Freq: Two times a day (BID) | INTRAMUSCULAR | Status: DC
  Administered 2019-06-11 – 2019-06-12 (×2): 60 mg via INTRAVENOUS
  Filled 2019-06-11 (×2): qty 2

## 2019-06-11 MED ORDER — SALINE SPRAY 0.65 % NA SOLN
1.0000 | NASAL | Status: DC | PRN
  Administered 2019-06-12: 03:00:00 1 via NASAL
  Filled 2019-06-11: qty 44

## 2019-06-11 NOTE — Progress Notes (Signed)
PROGRESS NOTE    KID Howard Fox  [id number] DOB: 1960-09-08 DOA: 06/02/2019 PCP: Unk Pinto, MD   Brief Narrative: 59 year old male with history of HTN, HLD, diabetes, chronic pain syndrome, remote history of IVDU, history of vertebral osteomyelitis on chronic suppressive doxycycline, depression, hep C status post recent who came to the ED for persistent and worsening shortness of breath and associated fever&chills.Symptoms have been present for the past couple of days, progressive, and came to the hospital. His symptoms remind him of his pneumonia from last year. In the ED he was febrile, tachypneic, hypoxic requiring supplemental oxygen. COVID-19 was negative. He was admitted to the hospital and placed on broad-spectrum antibiotics for Pneumonia and after admission his blood cultures showed MRSA bacteremia.  06/05/2019-he is resting in bed anxious to go home.Saturation dropped to 77% overnight when he took off his oxygen accidentally in his sleep. Currently he is on 6 L of oxygen to keep saturation above 92%.  06/07/2019 patient's oxygen requirement went up to 15 L overnight.  06/08/2019 patient on 13 L of high flow nasal cannula. Reports that he slept well. Does not appear to be in any distress.  1/17- desaturated to 78 on 10 lit placed back on 15 lit he is refusing nebulizer treatments reporting its making him sick Stat chest x-ray done shows evidence of pulmonary edema likely some left pleural effusion  06/11/2019 he reports feeling better slept better was on 5 and half liters this morning and oxygen cut down to 4 L while I was in the room he is maintaining his saturation while speaking to me in full sentences without any distress  Assessment & Plan:   Principal Problem:   Community acquired pneumonia Active Problems:   Bipolar disorder ([place])   Essential hypertension   Hyperlipidemia, mixed   Vitamin D deficiency   MRSA bacteremia   Vertebral osteomyelitis  (HCC)   Fever   Dyspnea   Chronic pain syndrome   Pneumonia    #1 acute hypoxic respiratory failuremultifactorial-COPD exacerbation/pulmonary edema/pneumonia-oxygen saturation improving with starting Lasix.  Taper Solu-Medrol.  Taper oxygen to keep his sats above 88%.   Continue IV Solu-Medrol  Chest CT with groundglass opacities edema versus inflammation versus infection. ID has been consultedfor MRSA bacteremiaand narrowed antibiotics down to vancomycin. Increase Lasix to 40 mg twice a day.  Positive by 363 which is down from 790 after starting Lasix twice a day. He is on 5 L of oxygen which is down from 4 L yesterday.  However he gets easily hypoxic with tapering down. Repeat chest x-ray-06/10/2019 no new changes.  Echo shows normal ejection fraction.Left ventricular ejection fraction, by visual estimation, is 55 to 60%. The left ventricle has normal function. There is no left ventricular hypertrophy. Left ventricular diastolic parameters are indeterminate. The left ventricle has no regional wall motion abnormalities. Global right ventricle has normal systolic function.The right ventricular size is normal. No increase in right ventricular wall thickness. Left atrial size was normal. Right atrial size was normal. The mitral valve is normal in structure. No evidence of mitral valve regurgitation. No evidence of mitral stenosis. The tricuspid valve is normal in structure. The aortic valve is normal in structure. Aortic valve regurgitation is not visualized. No evidence of aortic valve sclerosis or stenosis. The pulmonic valve was normal in structure. Pulmonic valve regurgitation is not visualized. The inferior vena cava is normal in size with greater than 50% respiratory variability, suggesting right atrial pressure of 3 mmHg.  #2 MRSA bacteremia with  history of T-spine discitis-patient stopped doxy as an outpatient. Now on IV Vanco per ID. MRI of the thoracolumbar spine  shows no evidence of osteomyelitis.PICC line placed1/15/2021 right upper extremity.  #3 history of hepatitis C patient received treatment and tested negative.  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   Weight as of this encounter: 109.8 kg.  DVT prophylaxis:Lovenox Code Status:Full code  family Communication:Discussed withhis mother Bonnita Nasuti [id number]. Disposition Planpatient is being treated with vancomycin for MRSA bacteremia still with ongoing work-up still with leukocytosis and significant hypoxia and fever Consultants:  ID, cardiology  Procedures:None  Antimicrobials:Vancomycin Subjective:   Objective: Vitals:   06/10/19 2001 06/10/19 2038 06/11/19 0503 06/11/19 0845  BP:  (!) 142/87 132/79   Pulse:  72 81   Resp:  19 20   Temp:  98 F (36.7 C) (!) 97.5 F (36.4 C)   TempSrc:  Oral Oral   SpO2: 90% 92% 90% 92%  Weight:      Height:        Intake/Output Summary (Last 24 hours) at 06/11/2019 1121 Last data filed at 06/10/2019 2037 Gross per 24 hour  Intake --  Output 1225 ml  Net -1225 ml   Filed Weights   06/02/19 2232  Weight: 109.8 kg    Examination:  General exam: Appears calm and comfortable  Respiratory system: Scattered rhonchi bilateral lungs to auscultation. Respiratory effort normal. Cardiovascular system: S1 & S2 heard, RRR. No JVD, murmurs, rubs, gallops or clicks. No pedal edema. Gastrointestinal system: Abdomen is nondistended, soft and nontender. No organomegaly or masses felt. Normal bowel sounds heard. Central nervous system: Alert and oriented. No focal neurological deficits. Extremities: Symmetric 5 x 5 power. Skin: No rashes, lesions or ulcers Psychiatry: Judgement and insight appear normal.  Mood & affect appropriate.     Data Reviewed: I have personally reviewed following labs and imaging studies  CBC: Recent Labs  Lab 06/05/19 0616 06/06/19 0944 06/08/19 0001  WBC 14.3* 18.9* 17.4*  HGB 12.2* 12.4* 11.6*  HCT 38.2* 38.0* 35.7*  MCV 90.3 88.2 88.1  PLT 387 398 [id number]*   Basic Metabolic Panel: Recent Labs  Lab 06/05/19 0616 06/06/19 0032 06/07/19 0536 06/08/19 0001  NA 135  --   --  134*  [place] 4.4  --   --  4.1  CL 100  --   --  96*  CO2 23  --   --  26  GLUCOSE 126*  --   --  160*  BUN 15  --   --  23*  CREATININE 0.86 0.78 0.70 0.75  CALCIUM 8.9  --   --  9.2   GFR: Estimated Creatinine Clearance: 130.4 mL/min (by C-G formula based on SCr of 0.75 mg/dL). Liver Function Tests: Recent Labs  Lab 06/05/19 0616  AST 27  ALT 24  ALKPHOS 103  BILITOT 0.4  PROT 7.3  ALBUMIN 2.8*   No results for input(s): LIPASE, AMYLASE in the last 168 hours. No results for input(s): AMMONIA in the last 168 hours. Coagulation Profile: No results for input(s): INR, PROTIME in the last 168 hours. Cardiac Enzymes: No results for input(s): CKTOTAL, CKMB, CKMBINDEX, TROPONINI in the last 168 hours. BNP (last 3 results) No results for input(s): PROBNP  in the last 8760 hours. HbA1C: No results for input(s): HGBA1C in the last 72 hours. CBG: No results for input(s): GLUCAP in the last 168 hours. Lipid Profile: No results for input(s): CHOL, HDL, LDLCALC, TRIG, CHOLHDL, LDLDIRECT in the last 72 hours. Thyroid Function Tests: No results for input(s): TSH, T4TOTAL, FREET4, T3FREE, THYROIDAB in the last 72 hours. Anemia Panel: No results for input(s): VITAMINB12, FOLATE, FERRITIN, TIBC, IRON, RETICCTPCT in the last 72 hours. Sepsis Labs: No results for input(s): PROCALCITON, LATICACIDVEN in the last 168 hours.  Recent Results (from the past 240 hour(s))  Blood culture (routine x 2)     Status: Abnormal   Collection Time: 06/02/19 10:45 PM   Specimen: BLOOD  Result Value  Ref Range Status   Specimen Description   Final    BLOOD LEFT ANTECUBITAL Performed at [facility] [address], [place], [place] [address]    Special Requests   Final    BOTTLES DRAWN AEROBIC AND ANAEROBIC Blood Culture adequate volume   Culture  Setup Time   Final    GRAM POSITIVE COCCI IN CLUSTERS IN BOTH AEROBIC AND ANAEROBIC BOTTLES CRITICAL RESULT CALLED TO, READ BACK BY AND VERIFIED WITH: Shelda Jakes PHARMD 1640 06/03/19 A BROWNING    Culture (A)  Final    STAPHYLOCOCCUS AUREUS SUSCEPTIBILITIES PERFORMED ON PREVIOUS CULTURE WITHIN THE LAST 5 DAYS. Performed at [facility], [place] [address] [place], [place] [address]    Report Status 06/05/2019 FINAL  Final  Blood culture (routine x 2)     Status: Abnormal   Collection Time: 06/03/19 12:05 AM   Specimen: BLOOD  Result Value Ref Range Status   Specimen Description   Final    BLOOD RIGHT ANTECUBITAL Performed at [facility], [place] [address] [place], [place] [address]    Special Requests   Final    BOTTLES DRAWN AEROBIC AND ANAEROBIC Blood Culture adequate volume Performed at [facility] [address], [place], [place] [address]    Culture  Setup Time   Final    GRAM POSITIVE COCCI IN CLUSTERS IN BOTH AEROBIC AND ANAEROBIC BOTTLES CRITICAL RESULT CALLED TO, READ BACK BY AND VERIFIED WITH: J LEGGE PHARMD 2200 06/03/19 A BROWNING Performed at [facility], [place] [address] [place],  [address]    Culture METHICILLIN RESISTANT STAPHYLOCOCCUS AUREUS (A)  Final   Report Status 06/05/2019 FINAL  Final   Organism ID, Bacteria METHICILLIN RESISTANT STAPHYLOCOCCUS AUREUS  Final      Susceptibility   Methicillin resistant staphylococcus aureus - MIC*    CIPROFLOXACIN >=8 RESISTANT Resistant     ERYTHROMYCIN >=8 RESISTANT Resistant     GENTAMICIN <=0.5 SENSITIVE Sensitive     OXACILLIN >=4 RESISTANT Resistant     TETRACYCLINE <=1 SENSITIVE Sensitive     VANCOMYCIN 1  SENSITIVE Sensitive     TRIMETH/SULFA <=10 SENSITIVE Sensitive     CLINDAMYCIN <=0.25 SENSITIVE Sensitive     RIFAMPIN <=0.5 SENSITIVE Sensitive     Inducible Clindamycin NEGATIVE Sensitive     * METHICILLIN RESISTANT STAPHYLOCOCCUS AUREUS  Blood Culture ID Panel (Reflexed)     Status: Abnormal   Collection Time: 06/03/19 12:05 AM  Result Value Ref Range Status   Enterococcus species NOT DETECTED NOT DETECTED Final   Listeria monocytogenes NOT DETECTED NOT DETECTED Final   Staphylococcus species DETECTED (A) NOT DETECTED Final    Comment: CRITICAL RESULT CALLED TO, READ BACK BY AND VERIFIED WITH:  J LEGGE PHARMD 2200 06/03/19 A BROWNING    Staphylococcus aureus (BCID) DETECTED (A) NOT DETECTED Final    Comment: Methicillin (oxacillin)-resistant Staphylococcus aureus (MRSA). MRSA is predictably resistant to beta-lactam antibiotics (except ceftaroline). Preferred therapy is vancomycin unless clinically contraindicated. Patient requires contact precautions if  hospitalized. CRITICAL RESULT CALLED TO, READ BACK BY AND VERIFIED WITH: J LEGGE PHARMD 2200 06/03/19 A BROWNING    Methicillin resistance DETECTED (A) NOT DETECTED Final    Comment: CRITICAL RESULT CALLED TO, READ BACK BY AND VERIFIED WITH: J LEGGE PHARMD 2200 06/03/19 A BROWNING    Streptococcus species NOT DETECTED NOT DETECTED Final   Streptococcus agalactiae NOT DETECTED NOT DETECTED Final   Streptococcus pneumoniae NOT DETECTED NOT DETECTED Final   Streptococcus pyogenes NOT DETECTED NOT DETECTED Final   Acinetobacter baumannii NOT DETECTED NOT DETECTED Final   Enterobacteriaceae species NOT DETECTED NOT DETECTED Final   Enterobacter cloacae complex NOT DETECTED NOT DETECTED Final   Escherichia coli NOT DETECTED NOT DETECTED Final   Klebsiella oxytoca NOT DETECTED NOT DETECTED Final   Klebsiella pneumoniae NOT DETECTED NOT DETECTED Final   Proteus species NOT DETECTED NOT DETECTED Final   Serratia marcescens NOT DETECTED NOT  DETECTED Final   Haemophilus influenzae NOT DETECTED NOT DETECTED Final   Neisseria meningitidis NOT DETECTED NOT DETECTED Final   Pseudomonas aeruginosa NOT DETECTED NOT DETECTED Final   Candida albicans NOT DETECTED NOT DETECTED Final   Candida glabrata NOT DETECTED NOT DETECTED Final   Candida krusei NOT DETECTED NOT DETECTED Final   Candida parapsilosis NOT DETECTED NOT DETECTED Final   Candida tropicalis NOT DETECTED NOT DETECTED Final    Comment: Performed at [facility], [place]. [address] [place], [place] [address]  SARS CORONAVIRUS 2 (TAT 6-24 HRS) Nasopharyngeal Nasopharyngeal Swab     Status: None   Collection Time: 06/03/19 12:35 AM   Specimen: Nasopharyngeal Swab  Result Value Ref Range Status   SARS Coronavirus 2 NEGATIVE NEGATIVE Final    Comment: (NOTE) SARS-CoV-2 target nucleic acids are NOT DETECTED. The SARS-CoV-2 RNA is generally detectable in upper and lower respiratory specimens during the acute phase of infection. Negative results do not preclude SARS-CoV-2 infection, do not rule out co-infections with other pathogens, and should not be used as the sole basis for treatment or other patient management decisions. Negative results must be combined with clinical observations, patient history, and epidemiological information. The expected result is Negative. Fact Sheet for Patients: SugarRoll.be Fact Sheet for Healthcare Providers: https://www.woods-.com/ This test is not yet approved or cleared by the [place] FDA and  has been authorized for detection and/or diagnosis of SARS-CoV-2 by FDA under an Emergency Use Authorization (EUA). This EUA will remain  in effect (meaning this test can be used) for the duration of the COVID-19 declaration under Section 56 4(b)(1) of the Act, 21 U.S.C. section 360bbb-3(b)(1), unless the authorization is terminated or revoked sooner. Performed at [facility],  [place] [address], [place], [place] [address]   Expectorated sputum assessment w rflx to resp cult     Status: None   Collection Time: 06/03/19  4:41 AM   Specimen: Expectorated Sputum  Result Value Ref Range Status   Specimen Description EXPECTORATED SPUTUM  Final   Special Requests NONE  Final   Sputum evaluation   Final    Sputum specimen not acceptable for testing.  Please recollect.   NOFIFIED [facility] AT [id number] ON 06/03/19 BY A,MOHAMED Performed at [facility], [place] [address] [place], [place] [address]    Report Status 06/03/2019 FINAL  Final  Respiratory Panel by PCR     Status: None   Collection Time: 06/03/19 10:12 AM   Specimen: Nasopharyngeal Swab; Respiratory  Result Value Ref Range Status   Adenovirus NOT DETECTED NOT DETECTED Final   Coronavirus 229E NOT DETECTED NOT DETECTED Final    Comment: (NOTE) The Coronavirus on the Respiratory Panel, DOES NOT test for the novel  Coronavirus (2019 nCoV)    Coronavirus HKU1 NOT DETECTED NOT DETECTED Final   Coronavirus NL63 NOT DETECTED NOT DETECTED Final   Coronavirus OC43 NOT DETECTED NOT DETECTED Final   Metapneumovirus NOT DETECTED NOT DETECTED Final   Rhinovirus / Enterovirus NOT DETECTED NOT DETECTED Final   Influenza A NOT DETECTED NOT DETECTED Final   Influenza B NOT DETECTED NOT DETECTED Final   Parainfluenza Virus 1 NOT DETECTED NOT DETECTED Final   Parainfluenza Virus 2 NOT DETECTED NOT DETECTED Final   Parainfluenza Virus 3 NOT DETECTED NOT DETECTED Final   Parainfluenza Virus 4 NOT DETECTED NOT DETECTED Final   Respiratory Syncytial Virus NOT DETECTED NOT DETECTED Final   Bordetella pertussis NOT DETECTED NOT DETECTED Final   Chlamydophila pneumoniae NOT DETECTED NOT DETECTED Final   Mycoplasma pneumoniae NOT DETECTED NOT DETECTED Final    Comment: Performed at [facility], [place]. [address], [place], [place] [address]  Culture, respiratory     Status: None   Collection Time: 06/03/19  4:40 PM     Specimen: SPU  Result Value Ref Range Status   Specimen Description   Final    SPUTUM Performed at [place] [address] [place], [place] [address]    Special Requests   Final    NONE Performed at [facility], [place] [address], [place], [place] [address]    Gram Stain   Final    RARE WBC PRESENT, PREDOMINANTLY PMN RARE GRAM POSITIVE COCCI IN PAIRS RARE BUDDING YEAST SEEN Performed at [facility], [place] [address] [place], [place] [address]    Culture   Final    FEW CANDIDA TROPICALIS RARE ENTEROCOCCUS FAECALIS    Report Status 06/07/2019 FINAL  Final   Organism ID, Bacteria ENTEROCOCCUS FAECALIS  Final      Susceptibility   Enterococcus faecalis - MIC*    AMPICILLIN <=2 SENSITIVE Sensitive     VANCOMYCIN 1 SENSITIVE Sensitive     GENTAMICIN SYNERGY SENSITIVE Sensitive     * RARE ENTEROCOCCUS FAECALIS  MRSA PCR Screening     Status: None   Collection Time: 06/03/19  8:11 PM   Specimen: Nasopharyngeal  Result Value Ref Range Status   MRSA by PCR NEGATIVE NEGATIVE Final    Comment:        The GeneXpert MRSA Assay (FDA approved for NASAL specimens only), is one component of a comprehensive MRSA colonization surveillance program. It is not intended to diagnose MRSA infection nor to guide or monitor treatment for MRSA infections. Performed at [facility], [place] [address], [place],  [address]   Culture, blood (Routine X 2) w Reflex to ID Panel     Status: None   Collection Time: 06/05/19 10:43 AM   Specimen: BLOOD RIGHT HAND  Result Value Ref Range Status   Specimen Description   Final    BLOOD RIGHT HAND Performed at [facility] [address] [place],  [address]  Special Requests   Final    BOTTLES DRAWN AEROBIC AND ANAEROBIC Blood Culture adequate volume Performed at [facility] [address] [place], [place] [address]    Culture    Final    NO GROWTH 5 DAYS Performed at [facility], [place] [address], [place], [place] [address]    Report Status 06/10/2019 FINAL  Final  Culture, blood (Routine X 2) w Reflex to ID Panel     Status: None   Collection Time: 06/05/19 10:43 AM   Specimen: BLOOD LEFT HAND  Result Value Ref Range Status   Specimen Description   Final    BLOOD LEFT HAND Performed at [facility] [address], [place], [place] [address]    Special Requests   Final    BOTTLES DRAWN AEROBIC AND ANAEROBIC Blood Culture adequate volume Performed at [facility] [address], [place], [place] [address]    Culture   Final    NO GROWTH 5 DAYS Performed at [facility], [place] [address] [place], [place] [address]    Report Status 06/10/2019 FINAL  Final         Radiology Studies: DG Chest 1 View  Result Date: 06/10/2019 CLINICAL DATA:  Worsening shortness of breath with fever and chills. EXAM: CHEST  1 VIEW COMPARISON:  June 08, 2018 FINDINGS: There is stable right-sided PICC line positioning. Multiple radiopaque surgical sutures are seen overlying the mid left lung and left upper quadrant. There is mild persistent left-sided volume loss with stable diffusely increased interstitial lung markings. Very mild, stable left basilar atelectasis and/or infiltrate is seen. The heart size and mediastinal contours are within normal limits. Bilateral pedicle screws are seen within the mid and lower thoracic spine. IMPRESSION: 1. No significant interval change compared to the prior chest plain film dated June 09, 2019. Electronically Signed   By: Virgina Norfolk M.D.   On: 06/10/2019 18:11        Scheduled Meds: . amitriptyline  150 mg Oral QHS  . aspirin EC  325 mg Oral Daily  . Chlorhexidine Gluconate Cloth  6 each Topical Daily  . citalopram  20 mg Oral Q lunch  . furosemide  40 mg Intravenous BID  . gabapentin  300 mg Oral TID  .  Ipratropium-Albuterol  2 puff Inhalation BID  . methylPREDNISolone (SOLU-MEDROL) injection  60 mg Intravenous Q8H  . polyethylene glycol  17 g Oral Daily  . potassium chloride  10 mEq Oral BID  . rosuvastatin  40 mg Oral Daily  . sodium chloride flush  10-40 mL Intracatheter Q12H  . cholecalciferol  5,000 Units Oral Daily   Continuous Infusions: . Vancomycin 750 mg (06/11/19 0543)     LOS: 8 days     Georgette Shell, MD [facility]  If 7PM-7AM, please contact night-coverage [email or web address] Password [facility] 06/11/2019, 11:21 AM

## 2019-06-11 NOTE — Care Management Important Message (Signed)
Important Message  Patient Details IM Letter given to Roque Lias SW Case Manager to present to the Patient Name: KOHEI BOORTZ MRN: [id number] Date of Birth: 1960-06-01   Medicare Important Message Given:  Yes     Kerin Salen 06/11/2019, 12:38 PM

## 2019-06-11 NOTE — TOC Initial Note (Addendum)
Transition of Care [facility]) - Initial/Assessment Note    Patient Details  Name: Howard Fox MRN: [id number] Date of Birth: 02-Mar-1961  [facility]) CM/SW Contact:    Trish Mage, LCSW Phone Number: 06/11/2019, 12:54 PM  Clinical Narrative:    [name] will return home with his mother at d/c; is scheduled for a course of 6 weeks of IV abx.  Contacted Pam with [facility] to ask for help with setting up medications, [place] services. TOC will continue to follow during the course of hospitalization.  Addendum: [facility] will follow for [facility].                Expected Discharge Plan: [place] Barriers to Discharge: No Barriers Identified   Patient Goals and CMS Choice        Expected Discharge Plan and Services Expected Discharge Plan: [place]                                              Prior Living Arrangements/Services                       Activities of Daily Living Home Assistive Devices/Equipment: None ADL Screening (condition at time of admission) Patient's cognitive ability adequate to safely complete daily activities?: No Is the patient deaf or have difficulty hearing?: No Does the patient have difficulty seeing, even when wearing glasses/contacts?: No Does the patient have difficulty concentrating, remembering, or making decisions?: No Patient able to express need for assistance with ADLs?: Yes Does the patient have difficulty dressing or bathing?: No Independently performs ADLs?: Yes (appropriate for developmental age) Does the patient have difficulty walking or climbing stairs?: No Weakness of Legs: None Weakness of Arms/Hands: None  Permission Sought/Granted                  Emotional Assessment              Admission diagnosis:  Cough [R05] Pneumonia [J18.9] Dyspnea [R06.00] Hypoxia [R09.02] Fever in adult [R50.9] Patient Active Problem List   Diagnosis Date Noted  . Community  acquired pneumonia 06/03/2019  . Fever 06/03/2019  . Tachypnea 06/03/2019  . Dyspnea 06/03/2019  . Chronic pain syndrome 06/03/2019  . Pneumonia 06/03/2019  . Back pain 04/04/2019  . Aortic atherosclerosis ([place]) 04/04/2019  . H/O intravenous drug use in remission 02/21/2019  . Low blood pressure 02/20/2019  . Postoperative anemia due to acute blood loss 02/12/2019  . Vertebral osteomyelitis ([place]) 10/25/2018  . Anxiety 10/25/2018  . History of cocaine use 10/24/2018  . Marijuana smoker 10/24/2018  . MRSA bacteremia 10/09/2018  . Diskitis 10/08/2018  . History of prediabetes 06/26/2018  . Hepatitis C, chronic ([place]) 04/01/2015  . Tobacco use disorder 04/01/2015  . Other abnormal glucose 12/10/2014  . Obesity (BMI 30.0-34.9) 12/10/2014  . Medication management 12/03/2013  . Essential hypertension 04/25/2013  . Hyperlipidemia, mixed 04/25/2013  . Vitamin D deficiency 04/25/2013  . Bipolar disorder ([place]) 02/19/2007   PCP:  Unk Pinto, MD Pharmacy:   [facility] [address] [place] [address] Phone: [phone] Fax: [phone]  [facility]), [place] - [id number] [place]) [place] [address] Phone: [phone] Fax: [phone]  Social Determinants of Health (SDOH) Interventions    Readmission Risk Interventions Readmission Risk Prevention Plan 02/05/2019  Transportation Screening Complete  PCP or Specialist Appt within 5-7 Days Complete  Home Care Screening Complete  Medication Review (RN CM) Complete  Some recent data might be hidden

## 2019-06-12 ENCOUNTER — Inpatient Hospital Stay (HOSPITAL_COMMUNITY): Payer: Medicare Other

## 2019-06-12 LAB — CBC
HCT: 44.1 % (ref 39.0–52.0)
Hemoglobin: 14.3 g/dL (ref 13.0–17.0)
MCH: 28.7 pg (ref 26.0–34.0)
MCHC: 32.4 g/dL (ref 30.0–36.0)
MCV: 88.6 fL (ref 80.0–100.0)
Platelets: 692 10*3/uL — ABNORMAL HIGH (ref 150–400)
RBC: 4.98 MIL/uL (ref 4.22–5.81)
RDW: 14.8 % (ref 11.5–15.5)
WBC: 18.3 10*3/uL — ABNORMAL HIGH (ref 4.0–10.5)
nRBC: 0 % (ref 0.0–0.2)

## 2019-06-12 LAB — COMPREHENSIVE METABOLIC PANEL
ALT: 72 U/L — ABNORMAL HIGH (ref 0–44)
AST: 38 U/L (ref 15–41)
Albumin: 2.9 g/dL — ABNORMAL LOW (ref 3.5–5.0)
Alkaline Phosphatase: 115 U/L (ref 38–126)
Anion gap: 9 (ref 5–15)
BUN: 28 mg/dL — ABNORMAL HIGH (ref 6–20)
CO2: 28 mmol/L (ref 22–32)
Calcium: 9 mg/dL (ref 8.9–10.3)
Chloride: 95 mmol/L — ABNORMAL LOW (ref 98–111)
Creatinine, Ser: 0.84 mg/dL (ref 0.61–1.24)
GFR calc Af Amer: >60 mL/min (ref >60)
GFR calc non Af Amer: >60 mL/min (ref >60)
Glucose, Bld: 162 mg/dL — ABNORMAL HIGH (ref 70–99)
Potassium: 5 mmol/L (ref 3.5–5.1)
Sodium: 132 mmol/L — ABNORMAL LOW (ref 135–145)
Total Bilirubin: 0.4 mg/dL (ref 0.3–1.2)
Total Protein: 7.6 g/dL (ref 6.5–8.1)

## 2019-06-12 MED ORDER — VANCOMYCIN IV (FOR PTA / DISCHARGE USE ONLY): 750.0000 mg | Freq: Three times a day (TID) | INTRAVENOUS | 0 refills | Status: AC

## 2019-06-12 MED ORDER — POTASSIUM CHLORIDE ER 10 MEQ PO TBCR: 10.0000 meq | EXTENDED_RELEASE_TABLET | Freq: Every day | ORAL | 0 refills | Status: AC

## 2019-06-12 MED ORDER — ALPRAZOLAM 0.5 MG PO TABS: 0.5000 mg | ORAL_TABLET | Freq: Three times a day (TID) | ORAL | 0 refills | Status: AC | PRN

## 2019-06-12 MED ORDER — FUROSEMIDE 40 MG PO TABS: 40.0000 mg | ORAL_TABLET | Freq: Two times a day (BID) | ORAL | 11 refills | Status: AC

## 2019-06-12 MED ORDER — PREDNISONE 10 MG PO TABS: ORAL_TABLET | ORAL | 0 refills | Status: AC

## 2019-06-12 MED ORDER — SALINE SPRAY 0.65 % NA SOLN: 1.0000 | NASAL | 0 refills | Status: AC | PRN

## 2019-06-12 NOTE — Discharge Summary (Addendum)
Physician Discharge Summary  Howard Fox WGY:[id number] DOB: [birth year] DOA: 06/02/2019  PCP: Unk Pinto, MD  Admit date: 06/02/2019 Discharge date: 06/12/2019  Admitted From: Home Disposition: Home Recommendations for Outpatient Follow-up:  1. Follow up with PCP in 1-2 weeks 2. Please obtain CMP/CBC in one week 3. Please follow up on the following pending results:  4. 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 His symptoms remind him of his pneumonia from last year. In the ED he was febrile, tachypneic, hypoxic requiring supplemental oxygen. COVID-19 was negative. He was admitted to the hospital and placed on broad-spectrum antibiotics for Pneumonia and after admission his blood cultures showed MRSA bacteremia.    Discharge Diagnoses:  Principal Problem:   Community acquired pneumonia Active Problems:   Bipolar disorder ([place])   Essential hypertension   Hyperlipidemia, mixed   Vitamin D deficiency   MRSA bacteremia   Vertebral osteomyelitis ([facility])   Fever   Dyspnea   Chronic pain syndrome   Pneumonia  #1 acute hypoxic respiratory failuremultifactorial-COPD exacerbation/pulmonary edema/pneumonia-initially he was on 15 L of oxygen this was tapered down to 3 L on the day of discharge.  His saturation was 89% on 3 L.  He appeared comfortable talking in full sentences without any distress.  He will be discharged on 3 L  of oxygen.  This patient was not on oxygen prior to admission to hospital.  I will continue Lasix and continue prednisone taper.  Chest CT with groundglass opacities edema versus inflammation versus infection. ID has been consultedfor MRSA bacteremiaand narrowed antibiotics down to vancomycin. Echo shows normal ejection fraction.Left ventricular ejection fraction, by visual estimation, is 55 to 60%. The left ventricle has normal function. There is no left ventricular hypertrophy. Left ventricular diastolic parameters are indeterminate. The left ventricle has no regional wall motion abnormalities. Global right ventricle has normal systolic function.The right ventricular size is normal. No increase in right ventricular wall thickness. Left atrial size was normal. Right atrial size was normal. The mitral valve is normal in structure. No evidence of mitral valve regurgitation. No evidence of mitral stenosis. The tricuspid valve is normal in structure. The aortic valve is normal in structure. Aortic valve regurgitation is not visualized. No evidence of aortic valve sclerosis or stenosis. The pulmonic valve was normal in structure. Pulmonic valve regurgitation is not visualized. The inferior vena cava is normal in size with greater than 50% respiratory variability, suggesting right atrial pressure of 3 mmHg.  #2 MRSA bacteremia with history of T-spine discitis-patient stopped doxy as an outpatient. Now on IV Vanco per ID. MRI of the thoracolumbar spine shows no evidence of osteomyelitis.PICC line placed1/15/2021 right upper extremity.  He will be discharged on vancomycin for 6 weeks.  He will follow-up with infectious disease.  He did not have a transesophageal echo during this admission as he was hypoxic and was not stable to undergo a TEE.  By the time he was tapered down on his oxygen he was too anxious to go home.  #3 history of hepatitis C patient  received treatment and tested  negative.  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   Weight as of this encounter: 109.8 kg.  Discharge Instructions  Discharge Instructions    Home infusion instructions   Complete by: As directed    Instructions: Flushing of vascular access device: 0.9% NaCl pre/post medication administration and prn patency; Heparin 100 u/ml, 48m for implanted ports and Heparin 10u/ml, 547mfor all other central venous catheters.     Allergies as of 06/12/2019      Reactions   Codeine Itching   Dilaudid [hydromorphone Hcl] Itching   Shellfish Allergy Diarrhea, Nausea And Vomiting      Medication List    STOP taking these medications   doxycycline 100 MG tablet Commonly known as: VIBRA-TABS   ibuprofen 200 MG tablet Commonly known as: ADVIL   oxyCODONE-acetaminophen 10-325 MG tablet Commonly known as: PERCOCET   Sofosbuvir-Velpatasvir 400-100 MG Tabs Commonly known as: Epclusa     TAKE these medications   albuterol 108 (90 Base) MCG/ACT inhaler Commonly known as: Ventolin HFA Use 2 Inhalations 15 minutes apart every 4 hours to Rescue Asthma What changed:   how much to take  how to take this  when to take this  reasons to take this  additional instructions   ALPRAZolam 0.5 MG tablet Commonly known as: XANAX Take 1 tablet (0.5 mg total) by mouth 3 (three) times daily as needed for anxiety.   amitriptyline 150 MG tablet Commonly known as: ELAVIL TAKE 1 TABLET BY MOUTH AT BEDTIME FOR SLEEP AND MOOD What changed:   how much to take  how to take this  when to take this  additional instructions   aspirin EC 325 MG tablet Take 325 mg by mouth daily.   carisoprodol 350 MG tablet Commonly known as: SOMA Take 350 mg by mouth every 6  (six) hours as needed for muscle spasms.   cholecalciferol 25 MCG (1000 UNIT) tablet Commonly known as: VITAMIN D3 Take 5,000 Units by mouth daily.   citalopram 40 MG tablet Commonly known as: CELEXA Take 1 tablet (40 mg total) by mouth daily with lunch. What changed: how much to take   furosemide 40 MG tablet Commonly known as: Lasix Take 1 tablet (40 mg total) by mouth 2 (two) times daily.   gabapentin 300 MG capsule Commonly known as: NEURONTIN Take 1 capsule 3 x /day if needed for Pain What changed:   how much to take  how to take this  when to take this  additional instructions   Icy Hot Advanced Relief 16-11 % Crea Generic drug: Menthol-Camphor Apply 1 application topically 4 (four) times daily as needed (pain).   oxyCODONE 5 MG immediate release tablet Commonly known as: Oxy IR/ROXICODONE Take 5-10 mg by mouth every 6 (six) hours as needed for moderate pain or severe pain.   polyethylene glycol 17 g packet Commonly known as: MIRALAX / GLYCOLAX Take 17 g by mouth daily as needed. What changed: reasons to take this   potassium chloride 10 MEQ tablet Commonly known as: KLOR-CON Take 1 tablet (10 mEq total) by mouth daily.   predniSONE 10 MG tablet Commonly known as: DELTASONE Take 4 tablets daily for first 4 days then 3 tablets daily  for the next 4 days then 2 tablets daily for the following 4 days and then 10 mg daily till all the medications are done.   rosuvastatin 40 MG tablet Commonly known as: CRESTOR TAKE 1/2 TO 1 (ONE-HALF TO ONE) TABLET ONCE DAILY OR  AS DIRECTED FOR CHOLESTEROL What changed:   how much to take  how to take this  when to take this  additional instructions   sodium chloride 0.65 % Soln nasal spray Commonly known as: OCEAN Place 1 spray into both nostrils as needed for congestion.   vancomycin  IVPB Inject 750 mg into the vein every 8 (eight) hours. Indication: MRSA bactermia Last Day of Therapy: 07/17/2019 Labs -  Sunday/Monday:  CBC/D, CMP, and vancomycin trough. Labs - Thursday:  BMP and vancomycin trough Labs - Every other week:  ESR and CRP            Home Infusion Instuctions  (From admission, onward)         Start     Ordered   06/12/19 0000  Home infusion instructions    Question:  Instructions  Answer:  Flushing of vascular access device: 0.9% NaCl pre/post medication administration and prn patency; Heparin 100 u/ml, 87m for implanted ports and Heparin 10u/ml, 553mfor all other central venous catheters.   06/12/19 1035          Allergies  Allergen Reactions  . Codeine Itching  . Dilaudid [Hydromorphone Hcl] Itching  . Shellfish Allergy Diarrhea and Nausea And Vomiting    Consultations:  Infectious disease   Procedures/Studies: DG Chest 1 View  Result Date: 06/12/2019 CLINICAL DATA:  Shortness of breath. Hypoxemia. EXAM: CHEST  1 VIEW COMPARISON:  Chest x-rays dated 06/10/2019 and 06/09/2019 and chest CT dated 06/06/2019 FINDINGS: The faint diffuse pulmonary infiltrates persist, unchanged as demonstrated on the prior chest CT. Heart size and pulmonary vascularity are normal. Prior resection of multiple left posteroinferior ribs with multiple surgical wires, some of which are chronically disrupted. Harrington rods in the thoracolumbar spine. IMPRESSION: Stable faint bilateral pulmonary infiltrates. Electronically Signed   By: JaLorriane Shire.D.   On: 06/12/2019 12:35   DG Chest 1 View  Result Date: 06/10/2019 CLINICAL DATA:  Worsening shortness of breath with fever and chills. EXAM: CHEST  1 VIEW COMPARISON:  June 08, 2018 FINDINGS: There is stable right-sided PICC line positioning. Multiple radiopaque surgical sutures are seen overlying the mid left lung and left upper quadrant. There is mild persistent left-sided volume loss with stable diffusely increased interstitial lung markings. Very mild, stable left basilar atelectasis and/or infiltrate is seen. The heart size and  mediastinal contours are within normal limits. Bilateral pedicle screws are seen within the mid and lower thoracic spine. IMPRESSION: 1. No significant interval change compared to the prior chest plain film dated June 09, 2019. Electronically Signed   By: ThVirgina Norfolk.D.   On: 06/10/2019 18:11   DG Chest 1 View  Result Date: 06/09/2019 CLINICAL DATA:  Hypoxemia EXAM: CHEST  1 VIEW COMPARISON:  Radiograph 06/06/2019, CT 06/06/2019 FINDINGS: Cardiac silhouette is enlarged. Thoracotomy defect with removal of LEFT posterior ribs. LEFT basilar atelectasis unchanged. Diffuse fine airspace disease throughout the lungs similar prior. PICC line with tip in distal SVC. IMPRESSION: 1. Diffuse airspace disease favoring pulmonary edema. 2. LEFT lower lobe atelectasis and effusion. 3. Postsurgical change in the LEFT hemithorax. Electronically Signed   By: StSuzy Bouchard.D.   On: 06/09/2019 09:42   DG Chest 1 View  Result Date: 06/06/2019 CLINICAL DATA:  Fever, shortness of breath, cough EXAM: CHEST  1 VIEW COMPARISON:  06/02/2019 FINDINGS: Interval worsening of diffuse bilateral interstitial airspace opacity. Unchanged cardiomegaly. Postoperative findings of posterior left chest wall resection. IMPRESSION: Interval worsening of diffuse bilateral interstitial airspace opacity, consistent with worsening edema and/or infection. Electronically Signed   By: Eddie Candle M.D.   On: 06/06/2019 09:56   CT CHEST W CONTRAST  Result Date: 06/06/2019 CLINICAL DATA:  59 year old male with history of hypoxemia. EXAM: CT CHEST WITH CONTRAST TECHNIQUE: Multidetector CT imaging of the chest was performed during intravenous contrast administration. CONTRAST:  1m OMNIPAQUE IOHEXOL 300 MG/ML  SOLN COMPARISON:  Chest CTA 10/08/2018. FINDINGS: Cardiovascular: Heart size is normal. There is no significant pericardial fluid, thickening or pericardial calcification. There is aortic atherosclerosis, as well as atherosclerosis  of the great vessels of the mediastinum and the coronary arteries, including calcified atherosclerotic plaque in the left main, left anterior descending, left circumflex and right coronary arteries. Mediastinum/Nodes: Prominent but nonenlarged mediastinal and hilar lymph nodes, measuring up to 1.1 cm in short axis in the prevascular nodal station, nonspecific. Esophagus is unremarkable in appearance. No axillary lymphadenopathy. Lungs/Pleura: Status post left thoracotomy for left lower lobectomy. Compensatory hyperexpansion of the left upper lobe. Widespread areas of ground-glass attenuation and septal thickening are noted throughout the lungs bilaterally, most evident throughout the mid to upper lungs. Several areas demonstrate relative sparing of the subpleural lung. No pleural effusions. No definite suspicious appearing pulmonary nodules or masses. Upper Abdomen: Aortic atherosclerosis.  Status post cholecystectomy. Musculoskeletal: Postoperative changes of left-sided thoracotomy and chest wall resection. New posterior rod and screw fixation device starting at T8 and extending below the lower margin of the images, traversing chronic compression fractures of T10 and T11 which both demonstrate greater than 90% loss of anterior vertebral body height resultant in an acute kyphotic deformity at the level of T10/T11. There are no aggressive appearing lytic or blastic lesions noted in the visualized portions of the skeleton. IMPRESSION: 1. Widespread areas of ground-glass attenuation and septal thickening most evident throughout the mid to upper lungs, with some areas of subpleural sparing. Findings are nonspecific, favored to be of infectious or inflammatory etiology. 2. Aortic atherosclerosis, in addition to left main and 3 vessel coronary artery disease. Please note that although the presence of coronary artery calcium documents the presence of coronary artery disease, the severity of this disease and any potential  stenosis cannot be assessed on this non-gated CT examination. Assessment for potential risk factor modification, dietary therapy or pharmacologic therapy may be warranted, if clinically indicated. 3. Postoperative changes, as above. Aortic Atherosclerosis (ICD10-I70.0). Electronically Signed   By: DVinnie LangtonM.D.   On: 06/06/2019 10:49   MR THORACIC SPINE WO CONTRAST  Result Date: 06/05/2019 CLINICAL DATA:  Mid back pain. Febrile. Prior fusion from T8-L2. History of vertebral osteomyelitis. EXAM: MRI THORACIC AND LUMBAR SPINE WITHOUT CONTRAST TECHNIQUE: Multiplanar and multiecho pulse sequences of the thoracic and lumbar spine were obtained without intravenous contrast. The patient refused to continue because he got hot. He said he would return at another time for contrast. COMPARISON:  Chest x-ray dated 06/02/2019 and thoracic radiographs dated 05/23/2019 and thoracic MRI dated 09/30/2018 FINDINGS: MRI THORACIC SPINE FINDINGS Alignment: Accentuated lower thoracic kyphosis secondary to previous bone destruction at T10-11. Vertebrae: Hardware obscures some detail from T8 distally. No visible discitis or osteomyelitis or other significant acute bone abnormality. Cord: The visualized portion of the thoracic spinal cord appears  normal. Hardware obscures the spinal cord at T10 extending distally. Paraspinal and other soft tissues: There is an abnormal appearance of both lungs, left worse than right. Recent chest x-ray of 06/02/2019 did not demonstrate significant abnormalities. This appearance may be artifactual. Disc levels: There are tiny disc bulges at T2-3 and T3-4 without neural impingement. The discs from C7-T1 through T6-7 are otherwise normal. No spinal or foraminal stenoses. T7-8: Tiny disc bulge to the right of midline with no neural impingement. T8-9: Negative. T9-10: Negative. T10-11: The details of the T10 and T11 vertebral bodies is obscured by hardware. The appearance suggests that those  vertebral bodies have fused with Ace secondary anterior wedge deformity due to the previous osteomyelitis. T11-12: No disc bulging or protrusion. T12-L1: No visible abnormality. MRI LUMBAR SPINE FINDINGS Segmentation:  Standard. Alignment:  2 mm retrolisthesis of L4 on L5. Vertebrae: Pedicle screws extend from T8-L2. No acute abnormality of the lumbar spine. Conus medullaris and cauda equina: Conus extends to the L1-2 level. Conus and cauda equina appear normal. Paraspinal and other soft tissues: Negative. Disc levels: T12-L1 and L1-2: Normal disc. Pedicle screws. L2-3: Small broad-based disc bulge without neural impingement. No facet arthritis. Pedicle screws in L2. L3-4: Small broad-based disc bulge asymmetric into the left neural foramen. Moderate bilateral facet arthritis with ligamentum flavum hypertrophy creating mild spinal stenosis with slight left foraminal stenosis. However, the left L3 nerve exits without impingement. Nearing of both lateral recesses. L4-5: Small disc bulge slightly asymmetric to the right. Moderate hypertrophy and arthritis of the right facet joint. No neural impingement. L5-S1: Marked disc space narrowing with degenerative changes of the vertebral endplates. Small broad-based disc protrusion with accompanying osteophytes extending into both neural foramina without severe foraminal stenosis. No focal neural impingement. Minimal degenerative changes of the right facet joint. IMPRESSION: MR THORACIC SPINE IMPRESSION No discrete osteomyelitis or discitis in the thoracic spine. Detail is limited at the level of the previous discitis and osteomyelitis at T10-11. 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 The thoracic spine otherwise demonstrates no significant abnormalities. MR LUMBAR SPINE IMPRESSION 1. 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 2. Moderate right facet arthritis at L4-5. Electronically Signed   By:  Lorriane Shire M.D.   On: 06/05/2019 14:57   MR LUMBAR SPINE WO CONTRAST  Result Date: 06/05/2019 CLINICAL DATA:  Mid back pain. Febrile. Prior fusion from T8-L2. History of vertebral osteomyelitis. EXAM: MRI THORACIC AND LUMBAR SPINE WITHOUT CONTRAST TECHNIQUE: Multiplanar and multiecho pulse sequences of the thoracic and lumbar spine were obtained without intravenous contrast. The patient refused to continue because he got hot. He said he would return at another time for contrast. COMPARISON:  Chest x-ray dated 06/02/2019 and thoracic radiographs dated 05/23/2019 and thoracic MRI dated 09/30/2018 FINDINGS: MRI THORACIC SPINE FINDINGS Alignment: Accentuated lower thoracic kyphosis secondary to previous bone destruction at T10-11. Vertebrae: Hardware obscures some detail from T8 distally. No visible discitis or osteomyelitis or other significant acute bone abnormality. Cord: The visualized portion of the thoracic spinal cord appears normal. Hardware obscures the spinal cord at T10 extending distally. Paraspinal and other soft tissues: There is an abnormal appearance of both lungs, left worse than right. Recent chest x-ray of 06/02/2019 did not demonstrate significant abnormalities. This appearance may be artifactual. Disc levels: There are tiny disc bulges at T2-3 and T3-4 without neural impingement. The discs from C7-T1 through T6-7 are otherwise normal. No spinal or foraminal stenoses.  T7-8: Tiny disc bulge to the right of midline with no neural impingement. T8-9: Negative. T9-10: Negative. T10-11: The details of the T10 and T11 vertebral bodies is obscured by hardware. The appearance suggests that those vertebral bodies have fused with Ace secondary anterior wedge deformity due to the previous osteomyelitis. T11-12: No disc bulging or protrusion. T12-L1: No visible abnormality. MRI LUMBAR SPINE FINDINGS Segmentation:  Standard. Alignment:  2 mm retrolisthesis of L4 on L5. Vertebrae: Pedicle screws extend from  T8-L2. No acute abnormality of the lumbar spine. Conus medullaris and cauda equina: Conus extends to the L1-2 level. Conus and cauda equina appear normal. Paraspinal and other soft tissues: Negative. Disc levels: T12-L1 and L1-2: Normal disc. Pedicle screws. L2-3: Small broad-based disc bulge without neural impingement. No facet arthritis. Pedicle screws in L2. L3-4: Small broad-based disc bulge asymmetric into the left neural foramen. Moderate bilateral facet arthritis with ligamentum flavum hypertrophy creating mild spinal stenosis with slight left foraminal stenosis. However, the left L3 nerve exits without impingement. Nearing of both lateral recesses. L4-5: Small disc bulge slightly asymmetric to the right. Moderate hypertrophy and arthritis of the right facet joint. No neural impingement. L5-S1: Marked disc space narrowing with degenerative changes of the vertebral endplates. Small broad-based disc protrusion with accompanying osteophytes extending into both neural foramina without severe foraminal stenosis. No focal neural impingement. Minimal degenerative changes of the right facet joint. IMPRESSION: MR THORACIC SPINE IMPRESSION No discrete osteomyelitis or discitis in the thoracic spine. Detail is limited at the level of the previous discitis and osteomyelitis at T10-11. 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 The thoracic spine otherwise demonstrates no significant abnormalities. MR LUMBAR SPINE IMPRESSION 1. 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 2. Moderate right facet arthritis at L4-5. Electronically Signed   By: Lorriane Shire M.D.   On: 06/05/2019 14:57   DG Chest Port 1 View  Result Date: 06/02/2019 CLINICAL DATA:  Fever, shortness of breath EXAM: PORTABLE CHEST 1 VIEW COMPARISON:  11/25/2018 FINDINGS: Heart is borderline in size. Mild vascular congestion. Bilateral perihilar opacities. Left basilar opacities.  Findings could reflect edema or infection. No visible significant effusions or acute bony abnormality. IMPRESSION: Borderline heart size. Vascular congestion with bilateral perihilar and left lower lobe opacities, edema versus infection. Electronically Signed   By: Rolm Baptise M.D.   On: 06/02/2019 22:58   ECHOCARDIOGRAM COMPLETE  Result Date: 06/04/2019   ECHOCARDIOGRAM REPORT   Patient Name:   Howard Fox Matheny Date of Exam: 06/04/2019 Medical Rec #:  [id number]      Height:       72.0 in Accession #:    [id number]     Weight:       242.0 lb Date of Birth:  01-27-1961      BSA:          2.31 m Patient Age:    59 years       BP:           132/75 mmHg Patient Gender: M              HR:           95 bpm. Exam Location:  Inpatient Procedure: 2D Echo, Cardiac Doppler and Color Doppler Indications:    Bacteremia  History:        Patient has prior history of Echocardiogram examinations, most  recent 10/10/2018. Signs/Symptoms:Bacteremia; Risk                 Factors:Hypertension, Dyslipidemia and Diabetes. Hep. C, IVDU,                 MRSA, T-spine discitis.  Sonographer:    Dustin Flock Referring Phys: [place]  1. Left ventricular ejection fraction, by visual estimation, is 55 to 60%. The left ventricle has normal function. There is no left ventricular hypertrophy.  2. Left ventricular diastolic parameters are indeterminate.  3. The left ventricle has no regional wall motion abnormalities.  4. Global right ventricle has normal systolic function.The right ventricular size is normal. No increase in right ventricular wall thickness.  5. Left atrial size was normal.  6. Right atrial size was normal.  7. The mitral valve is normal in structure. No evidence of mitral valve regurgitation. No evidence of mitral stenosis.  8. The tricuspid valve is normal in structure.  9. The aortic valve is normal in structure. Aortic valve regurgitation is not visualized. No evidence of aortic  valve sclerosis or stenosis. 10. The pulmonic valve was normal in structure. Pulmonic valve regurgitation is not visualized. 11. The inferior vena cava is normal in size with greater than 50% respiratory variability, suggesting right atrial pressure of 3 mmHg. FINDINGS  Left Ventricle: Left ventricular ejection fraction, by visual estimation, is 55 to 60%. The left ventricle has normal function. The left ventricle has no regional wall motion abnormalities. There is no left ventricular hypertrophy. Left ventricular diastolic parameters are indeterminate. Indeterminate filling pressures. Right Ventricle: The right ventricular size is normal. No increase in right ventricular wall thickness. Global RV systolic function is has normal systolic function. Left Atrium: Left atrial size was normal in size. Right Atrium: Right atrial size was normal in size Pericardium: There is no evidence of pericardial effusion. Mitral Valve: The mitral valve is normal in structure. No evidence of mitral valve regurgitation. No evidence of mitral valve stenosis by observation. Tricuspid Valve: The tricuspid valve is normal in structure. Tricuspid valve regurgitation is not demonstrated. Aortic Valve: The aortic valve is normal in structure. Aortic valve regurgitation is not visualized. The aortic valve is structurally normal, with no evidence of sclerosis or stenosis. Pulmonic Valve: The pulmonic valve was normal in structure. Pulmonic valve regurgitation is not visualized. Pulmonic regurgitation is not visualized. Aorta: The aortic root, ascending aorta and aortic arch are all structurally normal, with no evidence of dilitation or obstruction. Venous: The inferior vena cava is normal in size with greater than 50% respiratory variability, suggesting right atrial pressure of 3 mmHg. IAS/Shunts: No atrial level shunt detected by color flow Doppler. There is no evidence of a patent foramen ovale. No ventricular septal defect is seen or  detected. There is no evidence of an atrial septal defect. Additional Comments: No vegetations are seen. Consider TEE if the suspicion for endocarditis is high.  LEFT VENTRICLE PLAX 2D LVIDd:         5.21 cm  Diastology LVIDs:         3.38 cm  LV e' lateral:   7.94 cm/s LV PW:         1.09 cm  LV E/e' lateral: 8.6 LV IVS:        1.13 cm  LV e' medial:    5.22 cm/s LVOT diam:     2.50 cm  LV E/e' medial:  13.1 LV SV:  83 ml LV SV Index:   34.81 LVOT Area:     4.91 cm  RIGHT VENTRICLE RV Basal diam:  2.69 cm RV S prime:     13.20 cm/s TAPSE (M-mode): 2.9 cm LEFT ATRIUM             Index       RIGHT ATRIUM           Index LA diam:        3.50 cm 1.51 cm/m  RA Area:     14.20 cm LA Vol (A2C):   48.1 ml 20.82 ml/m RA Volume:   40.60 ml  17.57 ml/m LA Vol (A4C):   51.2 ml 22.16 ml/m LA Biplane Vol: 51.8 ml 22.42 ml/m  AORTIC VALVE LVOT Vmax:   99.80 cm/s LVOT Vmean:  65.700 cm/s LVOT VTI:    0.206 m  AORTA Ao Root diam: 3.70 cm MITRAL VALVE MV Area (PHT): 5.66 cm             SHUNTS MV PHT:        38.86 msec           Systemic VTI:  0.21 m MV Decel Time: 134 msec             Systemic Diam: 2.50 cm MV E velocity: 68.40 cm/s 103 cm/s MV A velocity: 65.50 cm/s 70.3 cm/s MV E/A ratio:  1.04       1.5  Mihai Croitoru MD Electronically signed by Sanda Klein MD Signature Date/Time: 06/04/2019/6:09:26 PM    Final    [place] EKG SITE RITE  Result Date: 06/06/2019 If Site Rite image not attached, placement could not be confirmed due to current cardiac rhythm.   (Echo, Carotid, EGD, Colonoscopy, ERCP)    Subjective: He is awake alert anxious to go home  Discharge Exam: Vitals:   06/12/19 1054 06/12/19 1412  BP:  124/84  Pulse:  87  Resp:  16  Temp:  98.7 F (37.1 C)  SpO2: 91% 94%   Vitals:   06/11/19 1943 06/12/19 0502 06/12/19 1054 06/12/19 1412  BP: 134/79 124/83  124/84  Pulse: 91 82  87  Resp: _0 Temp: 98.2 F (36.8 C) 98.4 F (36.9 C)  98.7 F (37.1 C)  TempSrc: Oral Oral   Oral  SpO2: 92% 95% 91% 94%  Weight:      Height:        General: Pt is alert, awake, not in acute distress Cardiovascular: RRR, S1/S2 +, no rubs, no gallops Respiratory: Scattered rhonchi and wheezing bilaterally, no wheezing, no rhonchi Abdominal: Soft, NT, ND, bowel sounds + Extremities: no edema, no cyanosis    The results of significant diagnostics from this hospitalization (including imaging, microbiology, ancillary and laboratory) are listed below for reference.     Microbiology: Recent Results (from the past 240 hour(s))  Blood culture (routine x 2)     Status: Abnormal   Collection Time: 06/02/19 10:45 PM   Specimen: BLOOD  Result Value Ref Range Status   Specimen Description   Final    BLOOD LEFT ANTECUBITAL Performed at [facility] [address] [place], [place] [address]    Special Requests   Final    BOTTLES DRAWN AEROBIC AND ANAEROBIC Blood Culture adequate volume   Culture  Setup Time   Final    GRAM POSITIVE COCCI IN CLUSTERS IN BOTH AEROBIC AND ANAEROBIC BOTTLES CRITICAL RESULT CALLED TO, READ BACK BY AND VERIFIED  WITH: Shelda Jakes PHARMD 1640 06/03/19 A BROWNING    Culture (A)  Final    STAPHYLOCOCCUS AUREUS SUSCEPTIBILITIES PERFORMED ON PREVIOUS CULTURE WITHIN THE LAST 5 DAYS. Performed at [facility], [place] [address], [place], [place] [address]    Report Status 06/05/2019 FINAL  Final  Blood culture (routine x 2)     Status: Abnormal   Collection Time: 06/03/19 12:05 AM   Specimen: BLOOD  Result Value Ref Range Status   Specimen Description   Final    BLOOD RIGHT ANTECUBITAL Performed at [facility], [place] [address] [place], [place] [address]    Special Requests   Final    BOTTLES DRAWN AEROBIC AND ANAEROBIC Blood Culture adequate volume Performed at [facility] [address], [place], [place] [address]    Culture  Setup Time   Final    GRAM POSITIVE COCCI IN CLUSTERS IN BOTH AEROBIC AND  ANAEROBIC BOTTLES CRITICAL RESULT CALLED TO, READ BACK BY AND VERIFIED WITH: J LEGGE PHARMD 2200 06/03/19 A BROWNING Performed at [facility], [place] [address], [place],  [address]    Culture METHICILLIN RESISTANT STAPHYLOCOCCUS AUREUS (A)  Final   Report Status 06/05/2019 FINAL  Final   Organism ID, Bacteria METHICILLIN RESISTANT STAPHYLOCOCCUS AUREUS  Final      Susceptibility   Methicillin resistant staphylococcus aureus - MIC*    CIPROFLOXACIN >=8 RESISTANT Resistant     ERYTHROMYCIN >=8 RESISTANT Resistant     GENTAMICIN <=0.5 SENSITIVE Sensitive     OXACILLIN >=4 RESISTANT Resistant     TETRACYCLINE <=1 SENSITIVE Sensitive     VANCOMYCIN 1 SENSITIVE Sensitive     TRIMETH/SULFA <=10 SENSITIVE Sensitive     CLINDAMYCIN <=0.25 SENSITIVE Sensitive     RIFAMPIN <=0.5 SENSITIVE Sensitive     Inducible Clindamycin NEGATIVE Sensitive     * METHICILLIN RESISTANT STAPHYLOCOCCUS AUREUS  Blood Culture ID Panel (Reflexed)     Status: Abnormal   Collection Time: 06/03/19 12:05 AM  Result Value Ref Range Status   Enterococcus species NOT DETECTED NOT DETECTED Final   Listeria monocytogenes NOT DETECTED NOT DETECTED Final   Staphylococcus species DETECTED (A) NOT DETECTED Final    Comment: CRITICAL RESULT CALLED TO, READ BACK BY AND VERIFIED WITH: J LEGGE PHARMD 2200 06/03/19 A BROWNING    Staphylococcus aureus (BCID) DETECTED (A) NOT DETECTED Final    Comment: Methicillin (oxacillin)-resistant Staphylococcus aureus (MRSA). MRSA is predictably resistant to beta-lactam antibiotics (except ceftaroline). Preferred therapy is vancomycin unless clinically contraindicated. Patient requires contact precautions if  hospitalized. CRITICAL RESULT CALLED TO, READ BACK BY AND VERIFIED WITH: J LEGGE PHARMD 2200 06/03/19 A BROWNING    Methicillin resistance DETECTED (A) NOT DETECTED Final    Comment: CRITICAL RESULT CALLED TO, READ BACK BY AND VERIFIED WITH: J LEGGE PHARMD 2200 06/03/19 A  BROWNING    Streptococcus species NOT DETECTED NOT DETECTED Final   Streptococcus agalactiae NOT DETECTED NOT DETECTED Final   Streptococcus pneumoniae NOT DETECTED NOT DETECTED Final   Streptococcus pyogenes NOT DETECTED NOT DETECTED Final   Acinetobacter baumannii NOT DETECTED NOT DETECTED Final   Enterobacteriaceae species NOT DETECTED NOT DETECTED Final   Enterobacter cloacae complex NOT DETECTED NOT DETECTED Final   Escherichia coli NOT DETECTED NOT DETECTED Final   Klebsiella oxytoca NOT DETECTED NOT DETECTED Final   Klebsiella pneumoniae NOT DETECTED NOT DETECTED Final   Proteus species NOT DETECTED NOT DETECTED Final   Serratia marcescens NOT DETECTED NOT  DETECTED Final   Haemophilus influenzae NOT DETECTED NOT DETECTED Final   Neisseria meningitidis NOT DETECTED NOT DETECTED Final   Pseudomonas aeruginosa NOT DETECTED NOT DETECTED Final   Candida albicans NOT DETECTED NOT DETECTED Final   Candida glabrata NOT DETECTED NOT DETECTED Final   Candida krusei NOT DETECTED NOT DETECTED Final   Candida parapsilosis NOT DETECTED NOT DETECTED Final   Candida tropicalis NOT DETECTED NOT DETECTED Final    Comment: Performed at [facility], [place] [address] [place], [place] [address]  SARS CORONAVIRUS 2 (TAT 6-24 HRS) Nasopharyngeal Nasopharyngeal Swab     Status: None   Collection Time: 06/03/19 12:35 AM   Specimen: Nasopharyngeal Swab  Result Value Ref Range Status   SARS Coronavirus 2 NEGATIVE NEGATIVE Final    Comment: (NOTE) SARS-CoV-2 target nucleic acids are NOT DETECTED. The SARS-CoV-2 RNA is generally detectable in upper and lower respiratory specimens during the acute phase of infection. Negative results do not preclude SARS-CoV-2 infection, do not rule out co-infections with other pathogens, and should not be used as the sole basis for treatment or other patient management decisions. Negative results must be combined with clinical observations, patient history, and  epidemiological information. The expected result is Negative. Fact Sheet for Patients: SugarRoll.be Fact Sheet for Healthcare Providers: https://www.woods-.com/ This test is not yet approved or cleared by the [place] FDA and  has been authorized for detection and/or diagnosis of SARS-CoV-2 by FDA under an Emergency Use Authorization (EUA). This EUA will remain  in effect (meaning this test can be used) for the duration of the COVID-19 declaration under Section 56 4(b)(1) of the Act, 21 U.S.C. section 360bbb-3(b)(1), unless the authorization is terminated or revoked sooner. Performed at [facility], [place] [address], [place], [place] [address]   Expectorated sputum assessment w rflx to resp cult     Status: None   Collection Time: 06/03/19  4:41 AM   Specimen: Expectorated Sputum  Result Value Ref Range Status   Specimen Description EXPECTORATED SPUTUM  Final   Special Requests NONE  Final   Sputum evaluation   Final    Sputum specimen not acceptable for testing.  Please recollect.   NOFIFIED [facility] ER AT 6767 ON 06/03/19 BY A,MOHAMED Performed at [facility], [place] [address], [place], [place] [address]    Report Status 06/03/2019 FINAL  Final  Respiratory Panel by PCR     Status: None   Collection Time: 06/03/19 10:12 AM   Specimen: Nasopharyngeal Swab; Respiratory  Result Value Ref Range Status   Adenovirus NOT DETECTED NOT DETECTED Final   Coronavirus 229E NOT DETECTED NOT DETECTED Final    Comment: (NOTE) The Coronavirus on the Respiratory Panel, DOES NOT test for the novel  Coronavirus (2019 nCoV)    Coronavirus HKU1 NOT DETECTED NOT DETECTED Final   Coronavirus NL63 NOT DETECTED NOT DETECTED Final   Coronavirus OC43 NOT DETECTED NOT DETECTED Final   Metapneumovirus NOT DETECTED NOT DETECTED Final   Rhinovirus / Enterovirus NOT DETECTED NOT DETECTED Final   Influenza A NOT DETECTED NOT DETECTED  Final   Influenza B NOT DETECTED NOT DETECTED Final   Parainfluenza Virus 1 NOT DETECTED NOT DETECTED Final   Parainfluenza Virus 2 NOT DETECTED NOT DETECTED Final   Parainfluenza Virus 3 NOT DETECTED NOT DETECTED Final   Parainfluenza Virus 4 NOT DETECTED NOT DETECTED Final   Respiratory Syncytial Virus NOT DETECTED NOT DETECTED Final   Bordetella pertussis NOT DETECTED NOT  DETECTED Final   Chlamydophila pneumoniae NOT DETECTED NOT DETECTED Final   Mycoplasma pneumoniae NOT DETECTED NOT DETECTED Final    Comment: Performed at [facility], [place] [address] [place], [place] [address]  Culture, respiratory     Status: None   Collection Time: 06/03/19  4:40 PM   Specimen: SPU  Result Value Ref Range Status   Specimen Description   Final    SPUTUM Performed at [facility] [address] [place], [place] [address]    Special Requests   Final    NONE Performed at [facility], [place] [address], [place], [place] [address]    Gram Stain   Final    RARE WBC PRESENT, PREDOMINANTLY PMN RARE GRAM POSITIVE COCCI IN PAIRS RARE BUDDING YEAST SEEN Performed at [facility], [place] [address] [place], [place] [address]    Culture   Final    FEW CANDIDA TROPICALIS RARE ENTEROCOCCUS FAECALIS    Report Status 06/07/2019 FINAL  Final   Organism ID, Bacteria ENTEROCOCCUS FAECALIS  Final      Susceptibility   Enterococcus faecalis - MIC*    AMPICILLIN <=2 SENSITIVE Sensitive     VANCOMYCIN 1 SENSITIVE Sensitive     GENTAMICIN SYNERGY SENSITIVE Sensitive     * RARE ENTEROCOCCUS FAECALIS  MRSA PCR Screening     Status: None   Collection Time: 06/03/19  8:11 PM   Specimen: Nasopharyngeal  Result Value Ref Range Status   MRSA by PCR NEGATIVE NEGATIVE Final    Comment:        The GeneXpert MRSA Assay (FDA approved for NASAL specimens only), is one component of a comprehensive MRSA colonization surveillance program. It is not intended to  diagnose MRSA infection nor to guide or monitor treatment for MRSA infections. Performed at [facility], [place] [address] [place], [place] [address]   Culture, blood (Routine X 2) w Reflex to ID Panel     Status: None   Collection Time: 06/05/19 10:43 AM   Specimen: BLOOD RIGHT HAND  Result Value Ref Range Status   Specimen Description   Final    BLOOD RIGHT HAND Performed at [facility] [address], [place], [place] [address]    Special Requests   Final    BOTTLES DRAWN AEROBIC AND ANAEROBIC Blood Culture adequate volume Performed at [facility] [address] [place], [place] [address]    Culture   Final    NO GROWTH 5 DAYS Performed at [facility], [place] [address] [place], [place] [address]    Report Status 06/10/2019 FINAL  Final  Culture, blood (Routine X 2) w Reflex to ID Panel     Status: None   Collection Time: 06/05/19 10:43 AM   Specimen: BLOOD LEFT HAND  Result Value Ref Range Status   Specimen Description   Final    BLOOD LEFT HAND Performed at [facility] [address] [place], [place] [address]    Special Requests   Final    BOTTLES DRAWN AEROBIC AND ANAEROBIC Blood Culture adequate volume Performed at [facility] [address] [place],  [address]    Culture   Final    NO GROWTH 5 DAYS Performed at [facility], [place] [address], [place],  [address]    Report Status 06/10/2019 FINAL  Final     Labs: BNP (last 3 results) Recent  Labs    06/02/19 2237  BNP 76.7   Basic Metabolic Panel: Recent Labs  Lab 06/06/19 0032 06/07/19 0536 06/08/19 0001 06/12/19 0418  NA  --   --  134* 132*  K  --   --  4.1 5.0  CL  --   --  96* 95*  CO2  --   --  26 28  GLUCOSE  --   --  160* 162*  BUN  --   --  23* 28*  CREATININE 0.78 0.70 0.75 0.84  CALCIUM  --   --  9.2 9.0   Liver Function Tests: Recent Labs  Lab  06/12/19 0418  AST 38  ALT 72*  ALKPHOS 115  BILITOT 0.4  PROT 7.6  ALBUMIN 2.9*   No results for input(s): LIPASE, AMYLASE in the last 168 hours. No results for input(s): AMMONIA in the last 168 hours. CBC: Recent Labs  Lab 06/06/19 0944 06/08/19 0001 06/12/19 0418  WBC 18.9* 17.4* 18.3*  HGB 12.4* 11.6* 14.3  HCT 38.0* 35.7* 44.1  MCV 88.2 88.1 88.6  PLT 398 497* 692*   Cardiac Enzymes: No results for input(s): CKTOTAL, CKMB, CKMBINDEX, TROPONINI in the last 168 hours. BNP: Invalid input(s): POCBNP CBG: No results for input(s): GLUCAP in the last 168 hours. D-Dimer No results for input(s): DDIMER in the last 72 hours. Hgb A1c No results for input(s): HGBA1C in the last 72 hours. Lipid Profile No results for input(s): CHOL, HDL, LDLCALC, TRIG, CHOLHDL, LDLDIRECT in the last 72 hours. Thyroid function studies No results for input(s): TSH, T4TOTAL, T3FREE, THYROIDAB in the last 72 hours.  Invalid input(s): FREET3 Anemia work up No results for input(s): VITAMINB12, FOLATE, FERRITIN, TIBC, IRON, RETICCTPCT in the last 72 hours. Urinalysis    Component Value Date/Time   COLORURINE YELLOW 06/03/2019 0035   APPEARANCEUR CLEAR 06/03/2019 0035   LABSPEC 1.012 06/03/2019 0035   PHURINE 6.0 06/03/2019 0035   GLUCOSEU NEGATIVE 06/03/2019 0035   HGBUR MODERATE (A) 06/03/2019 0035   BILIRUBINUR NEGATIVE 06/03/2019 0035   KETONESUR NEGATIVE 06/03/2019 0035   PROTEINUR NEGATIVE 06/03/2019 0035   UROBILINOGEN 0.2 08/26/2012 1453   NITRITE NEGATIVE 06/03/2019 0035   LEUKOCYTESUR NEGATIVE 06/03/2019 0035   Sepsis Labs Invalid input(s): PROCALCITONIN,  WBC,  LACTICIDVEN Microbiology Recent Results (from the past 240 hour(s))  Blood culture (routine x 2)     Status: Abnormal   Collection Time: 06/02/19 10:45 PM   Specimen: BLOOD  Result Value Ref Range Status   Specimen Description   Final    BLOOD LEFT ANTECUBITAL Performed at [facility], [place]  [address] [place], [place] [address]    Special Requests   Final    BOTTLES DRAWN AEROBIC AND ANAEROBIC Blood Culture adequate volume   Culture  Setup Time   Final    GRAM POSITIVE COCCI IN CLUSTERS IN BOTH AEROBIC AND ANAEROBIC BOTTLES CRITICAL RESULT CALLED TO, READ BACK BY AND VERIFIED WITH: Shelda Jakes PHARMD 1640 06/03/19 A BROWNING    Culture (A)  Final    STAPHYLOCOCCUS AUREUS SUSCEPTIBILITIES PERFORMED ON PREVIOUS CULTURE WITHIN THE LAST 5 DAYS. Performed at [facility], [place] [address], [place], [place] [address]    Report Status 06/05/2019 FINAL  Final  Blood culture (routine x 2)     Status: Abnormal   Collection Time: 06/03/19 12:05 AM   Specimen: BLOOD  Result Value Ref Range Status   Specimen Description   Final    BLOOD  RIGHT ANTECUBITAL Performed at [facility], [place] [address] [place], [place] [address]    Special Requests   Final    BOTTLES DRAWN AEROBIC AND ANAEROBIC Blood Culture adequate volume Performed at [facility] [address], [place], [place] [address]    Culture  Setup Time   Final    GRAM POSITIVE COCCI IN CLUSTERS IN BOTH AEROBIC AND ANAEROBIC BOTTLES CRITICAL RESULT CALLED TO, READ BACK BY AND VERIFIED WITH: J LEGGE PHARMD 2200 06/03/19 A BROWNING Performed at [facility], [place] [address] [place], [place] [address]    Culture METHICILLIN RESISTANT STAPHYLOCOCCUS AUREUS (A)  Final   Report Status 06/05/2019 FINAL  Final   Organism ID, Bacteria METHICILLIN RESISTANT STAPHYLOCOCCUS AUREUS  Final      Susceptibility   Methicillin resistant staphylococcus aureus - MIC*    CIPROFLOXACIN >=8 RESISTANT Resistant     ERYTHROMYCIN >=8 RESISTANT Resistant     GENTAMICIN <=0.5 SENSITIVE Sensitive     OXACILLIN >=4 RESISTANT Resistant     TETRACYCLINE <=1 SENSITIVE Sensitive     VANCOMYCIN 1 SENSITIVE Sensitive     TRIMETH/SULFA <=10 SENSITIVE Sensitive     CLINDAMYCIN <=0.25 SENSITIVE Sensitive     RIFAMPIN <=0.5  SENSITIVE Sensitive     Inducible Clindamycin NEGATIVE Sensitive     * METHICILLIN RESISTANT STAPHYLOCOCCUS AUREUS  Blood Culture ID Panel (Reflexed)     Status: Abnormal   Collection Time: 06/03/19 12:05 AM  Result Value Ref Range Status   Enterococcus species NOT DETECTED NOT DETECTED Final   Listeria monocytogenes NOT DETECTED NOT DETECTED Final   Staphylococcus species DETECTED (A) NOT DETECTED Final    Comment: CRITICAL RESULT CALLED TO, READ BACK BY AND VERIFIED WITH: J LEGGE PHARMD 2200 06/03/19 A BROWNING    Staphylococcus aureus (BCID) DETECTED (A) NOT DETECTED Final    Comment: Methicillin (oxacillin)-resistant Staphylococcus aureus (MRSA). MRSA is predictably resistant to beta-lactam antibiotics (except ceftaroline). Preferred therapy is vancomycin unless clinically contraindicated. Patient requires contact precautions if  hospitalized. CRITICAL RESULT CALLED TO, READ BACK BY AND VERIFIED WITH: J LEGGE PHARMD 2200 06/03/19 A BROWNING    Methicillin resistance DETECTED (A) NOT DETECTED Final    Comment: CRITICAL RESULT CALLED TO, READ BACK BY AND VERIFIED WITH: J LEGGE PHARMD 2200 06/03/19 A BROWNING    Streptococcus species NOT DETECTED NOT DETECTED Final   Streptococcus agalactiae NOT DETECTED NOT DETECTED Final   Streptococcus pneumoniae NOT DETECTED NOT DETECTED Final   Streptococcus pyogenes NOT DETECTED NOT DETECTED Final   Acinetobacter baumannii NOT DETECTED NOT DETECTED Final   Enterobacteriaceae species NOT DETECTED NOT DETECTED Final   Enterobacter cloacae complex NOT DETECTED NOT DETECTED Final   Escherichia coli NOT DETECTED NOT DETECTED Final   Klebsiella oxytoca NOT DETECTED NOT DETECTED Final   Klebsiella pneumoniae NOT DETECTED NOT DETECTED Final   Proteus species NOT DETECTED NOT DETECTED Final   Serratia marcescens NOT DETECTED NOT DETECTED Final   Haemophilus influenzae NOT DETECTED NOT DETECTED Final   Neisseria meningitidis NOT DETECTED NOT DETECTED  Final   Pseudomonas aeruginosa NOT DETECTED NOT DETECTED Final   Candida albicans NOT DETECTED NOT DETECTED Final   Candida glabrata NOT DETECTED NOT DETECTED Final   Candida krusei NOT DETECTED NOT DETECTED Final   Candida parapsilosis NOT DETECTED NOT DETECTED Final   Candida tropicalis NOT DETECTED NOT DETECTED Final    Comment: Performed at [facility], [place] [address], [place], [place] [address]  SARS CORONAVIRUS 2 (TAT 6-24 HRS) Nasopharyngeal Nasopharyngeal Swab     Status: None   Collection Time: 06/03/19 12:35 AM   Specimen: Nasopharyngeal Swab  Result Value Ref Range Status   SARS Coronavirus 2 NEGATIVE NEGATIVE Final    Comment: (NOTE) SARS-CoV-2 target nucleic acids are NOT DETECTED. The SARS-CoV-2 RNA is generally detectable in upper and lower respiratory specimens during the acute phase of infection. Negative results do not preclude SARS-CoV-2 infection, do not rule out co-infections with other pathogens, and should not be used as the sole basis for treatment or other patient management decisions. Negative results must be combined with clinical observations, patient history, and epidemiological information. The expected result is Negative. Fact Sheet for Patients: SugarRoll.be Fact Sheet for Healthcare Providers: https://www.woods-.com/ This test is not yet approved or cleared by the [place] FDA and  has been authorized for detection and/or diagnosis of SARS-CoV-2 by FDA under an Emergency Use Authorization (EUA). This EUA will remain  in effect (meaning this test can be used) for the duration of the COVID-19 declaration under Section 56 4(b)(1) of the Act, 21 U.S.C. section 360bbb-3(b)(1), unless the authorization is terminated or revoked sooner. Performed at [facility], [place] [address] [place], [place] [address]   Expectorated sputum assessment w rflx to resp cult     Status: None   Collection  Time: 06/03/19  4:41 AM   Specimen: Expectorated Sputum  Result Value Ref Range Status   Specimen Description EXPECTORATED SPUTUM  Final   Special Requests NONE  Final   Sputum evaluation   Final    Sputum specimen not acceptable for testing.  Please recollect.   NOFIFIED [facility] ER AT 2919 ON 06/03/19 BY A,MOHAMED Performed at [facility], [place] [address] [place], [place] [address]    Report Status 06/03/2019 FINAL  Final  Respiratory Panel by PCR     Status: None   Collection Time: 06/03/19 10:12 AM   Specimen: Nasopharyngeal Swab; Respiratory  Result Value Ref Range Status   Adenovirus NOT DETECTED NOT DETECTED Final   Coronavirus 229E NOT DETECTED NOT DETECTED Final    Comment: (NOTE) The Coronavirus on the Respiratory Panel, DOES NOT test for the novel  Coronavirus (2019 nCoV)    Coronavirus HKU1 NOT DETECTED NOT DETECTED Final   Coronavirus NL63 NOT DETECTED NOT DETECTED Final   Coronavirus OC43 NOT DETECTED NOT DETECTED Final   Metapneumovirus NOT DETECTED NOT DETECTED Final   Rhinovirus / Enterovirus NOT DETECTED NOT DETECTED Final   Influenza A NOT DETECTED NOT DETECTED Final   Influenza B NOT DETECTED NOT DETECTED Final   Parainfluenza Virus 1 NOT DETECTED NOT DETECTED Final   Parainfluenza Virus 2 NOT DETECTED NOT DETECTED Final   Parainfluenza Virus 3 NOT DETECTED NOT DETECTED Final   Parainfluenza Virus 4 NOT DETECTED NOT DETECTED Final   Respiratory Syncytial Virus NOT DETECTED NOT DETECTED Final   Bordetella pertussis NOT DETECTED NOT DETECTED Final   Chlamydophila pneumoniae NOT DETECTED NOT DETECTED Final   Mycoplasma pneumoniae NOT DETECTED NOT DETECTED Final    Comment: Performed at [facility], [place]. [address], [place], [place] [address]  Culture, respiratory     Status: None   Collection Time: 06/03/19  4:40 PM   Specimen: SPU  Result Value Ref Range Status   Specimen Description   Final    SPUTUM Performed at [facility] [address], [place], [place] [address]    Special  Requests   Final    NONE Performed at [facility], [facility] [address] [place], [place] [address]    Gram Stain   Final    RARE WBC PRESENT, PREDOMINANTLY PMN RARE GRAM POSITIVE COCCI IN PAIRS RARE BUDDING YEAST SEEN Performed at [facility], [place] [address] [place], [place] [address]    Culture   Final    FEW CANDIDA TROPICALIS RARE ENTEROCOCCUS FAECALIS    Report Status 06/07/2019 FINAL  Final   Organism ID, Bacteria ENTEROCOCCUS FAECALIS  Final      Susceptibility   Enterococcus faecalis - MIC*    AMPICILLIN <=2 SENSITIVE Sensitive     VANCOMYCIN 1 SENSITIVE Sensitive     GENTAMICIN SYNERGY SENSITIVE Sensitive     * RARE ENTEROCOCCUS FAECALIS  MRSA PCR Screening     Status: None   Collection Time: 06/03/19  8:11 PM   Specimen: Nasopharyngeal  Result Value Ref Range Status   MRSA by PCR NEGATIVE NEGATIVE Final    Comment:        The GeneXpert MRSA Assay (FDA approved for NASAL specimens only), is one component of a comprehensive MRSA colonization surveillance program. It is not intended to diagnose MRSA infection nor to guide or monitor treatment for MRSA infections. Performed at [facility], [place] [address], [place], [place] [address]   Culture, blood (Routine X 2) w Reflex to ID Panel     Status: None   Collection Time: 06/05/19 10:43 AM   Specimen: BLOOD RIGHT HAND  Result Value Ref Range Status   Specimen Description   Final    BLOOD RIGHT HAND Performed at [place] [address] [place], [place] [address]    Special Requests   Final    BOTTLES DRAWN AEROBIC AND ANAEROBIC Blood Culture adequate volume Performed at [facility] [address] [place], [place] [address]    Culture   Final    NO GROWTH 5 DAYS Performed at [facility], [place] [address] [place], [place] [address]    Report  Status 06/10/2019 FINAL  Final  Culture, blood (Routine X 2) w Reflex to ID Panel     Status: None   Collection Time: 06/05/19 10:43 AM   Specimen: BLOOD LEFT HAND  Result Value Ref Range Status   Specimen Description   Final    BLOOD LEFT HAND Performed at [facility] [address], [place], [place] [address]    Special Requests   Final    BOTTLES DRAWN AEROBIC AND ANAEROBIC Blood Culture adequate volume Performed at [facility] [address], [place], [place] [address]    Culture   Final    NO GROWTH 5 DAYS Performed at [facility], [place] [address], [place], [place] [address]    Report Status 06/10/2019 FINAL  Final     Time coordinating discharge: 39 minutes  SIGNED:   Georgette Shell, MD  [facility] 06/12/2019, 2:54 PM Pager   If 7PM-7AM, please contact night-coverage [email or web address] Password [email or web address]

## 2019-06-12 NOTE — Progress Notes (Signed)
PT Screen Note  Patient Details Name: Howard Fox MRN: [id number] DOB: Jul 05, 1960   Cancelled Treatment:    Reason Eval/Treat Not Completed: PT screened, no needs identified, will sign off(Pt is independent at baseline with no device and has been able to mobilize with RN staff to ambulate in hallway with no assistance and no device. He reports 1 fall in last 6 months from slipping in socks on stairs but denies injury or additional falls.) Educated pt to contact PCP if he feels a need for skilled PT. Will sign off at this time; please re-consult if there is a change in functional status.   Verner Mould, DPT Physical Therapist with [facility] [phone]  06/12/2019 12:59 PM

## 2019-06-13 ENCOUNTER — Telehealth: Payer: Self-pay | Admitting: Internal Medicine

## 2019-06-13 ENCOUNTER — Telehealth: Payer: Self-pay | Admitting: *Deleted

## 2019-06-13 DIAGNOSIS — J440 Chronic obstructive pulmonary disease with acute lower respiratory infection: Secondary | ICD-10-CM | POA: Diagnosis not present

## 2019-06-13 DIAGNOSIS — J441 Chronic obstructive pulmonary disease with (acute) exacerbation: Secondary | ICD-10-CM | POA: Diagnosis not present

## 2019-06-13 DIAGNOSIS — M47816 Spondylosis without myelopathy or radiculopathy, lumbar region: Secondary | ICD-10-CM | POA: Diagnosis not present

## 2019-06-13 DIAGNOSIS — M4620 Osteomyelitis of vertebra, site unspecified: Secondary | ICD-10-CM | POA: Diagnosis not present

## 2019-06-13 DIAGNOSIS — Z7982 Long term (current) use of aspirin: Secondary | ICD-10-CM | POA: Diagnosis not present

## 2019-06-13 DIAGNOSIS — J9601 Acute respiratory failure with hypoxia: Secondary | ICD-10-CM

## 2019-06-13 DIAGNOSIS — G894 Chronic pain syndrome: Secondary | ICD-10-CM | POA: Diagnosis not present

## 2019-06-13 DIAGNOSIS — M48061 Spinal stenosis, lumbar region without neurogenic claudication: Secondary | ICD-10-CM | POA: Diagnosis not present

## 2019-06-13 DIAGNOSIS — Z7952 Long term (current) use of systemic steroids: Secondary | ICD-10-CM | POA: Diagnosis not present

## 2019-06-13 DIAGNOSIS — Z6837 Body mass index (BMI) 37.0-37.9, adult: Secondary | ICD-10-CM | POA: Diagnosis not present

## 2019-06-13 DIAGNOSIS — F419 Anxiety disorder, unspecified: Secondary | ICD-10-CM | POA: Diagnosis not present

## 2019-06-13 DIAGNOSIS — E559 Vitamin D deficiency, unspecified: Secondary | ICD-10-CM | POA: Diagnosis not present

## 2019-06-13 DIAGNOSIS — Z981 Arthrodesis status: Secondary | ICD-10-CM | POA: Diagnosis not present

## 2019-06-13 DIAGNOSIS — I700 Atherosclerosis of aorta: Secondary | ICD-10-CM | POA: Diagnosis not present

## 2019-06-13 DIAGNOSIS — E782 Mixed hyperlipidemia: Secondary | ICD-10-CM | POA: Diagnosis not present

## 2019-06-13 DIAGNOSIS — E119 Type 2 diabetes mellitus without complications: Secondary | ICD-10-CM | POA: Diagnosis not present

## 2019-06-13 DIAGNOSIS — B9562 Methicillin resistant Staphylococcus aureus infection as the cause of diseases classified elsewhere: Secondary | ICD-10-CM | POA: Diagnosis not present

## 2019-06-13 DIAGNOSIS — E669 Obesity, unspecified: Secondary | ICD-10-CM | POA: Diagnosis not present

## 2019-06-13 DIAGNOSIS — J189 Pneumonia, unspecified organism: Secondary | ICD-10-CM | POA: Diagnosis not present

## 2019-06-13 DIAGNOSIS — R7881 Bacteremia: Secondary | ICD-10-CM | POA: Diagnosis not present

## 2019-06-13 DIAGNOSIS — I10 Essential (primary) hypertension: Secondary | ICD-10-CM | POA: Diagnosis not present

## 2019-06-13 DIAGNOSIS — Z452 Encounter for adjustment and management of vascular access device: Secondary | ICD-10-CM | POA: Diagnosis not present

## 2019-06-13 DIAGNOSIS — B182 Chronic viral hepatitis C: Secondary | ICD-10-CM | POA: Diagnosis not present

## 2019-06-13 DIAGNOSIS — Z8781 Personal history of (healed) traumatic fracture: Secondary | ICD-10-CM | POA: Diagnosis not present

## 2019-06-13 DIAGNOSIS — F319 Bipolar disorder, unspecified: Secondary | ICD-10-CM | POA: Diagnosis not present

## 2019-06-13 DIAGNOSIS — Z792 Long term (current) use of antibiotics: Secondary | ICD-10-CM | POA: Diagnosis not present

## 2019-06-13 NOTE — Telephone Encounter (Signed)
Will set patient up for home continuous oxygen per hospital discharge. On RA patient got down to 81%, with 3 L of oxygen via NA patient went up to 89% saturations.

## 2019-06-13 NOTE — Telephone Encounter (Signed)
Called patient on 06/13/2019 , 12:23 PM in an attempt to reach the patient for a hospital follow up.   Admit date: 06/02/19 Discharge: 06/12/19   He does not have any questions or concerns about medications from the hospital admission. The patient's medications were reviewed over the phone, they were counseled to bring in all current medications to the hospital follow up visit.   I advised the patient to call if any questions or concerns arise about the hospital admission or medications    Home health was started in the hospital. [facility] is assisting the patient in regard to his self administered infusions of Vancomycin, which he reports are going well. He was also supposed to receive O2 at 3L, per the discharge summary. Home Health will be contacted by our office to order the oxygen for the patient. All questions were answered and a follow up appointment was made. An appointment was scheduled for 06/19/2019 with Irving Shows.  Prior to Admission medications   Medication Sig Start Date End Date Taking? Authorizing Provider  albuterol (VENTOLIN HFA) 108 (90 Base) MCG/ACT inhaler Use 2 Inhalations 15 minutes apart every 4 hours to Rescue Asthma Patient taking differently: Inhale 2 puffs into the lungs every 4 (four) hours as needed for wheezing or shortness of breath.  02/16/19   Unk Pinto, MD  ALPRAZolam Duanne Moron) 0.5 MG tablet Take 1 tablet (0.5 mg total) by mouth 3 (three) times daily as needed for anxiety. 06/12/19   Georgette Shell, MD  amitriptyline (ELAVIL) 150 MG tablet TAKE 1 TABLET BY MOUTH AT BEDTIME FOR SLEEP AND MOOD Patient taking differently: Take 150 mg by mouth at bedtime.  03/15/19   Liane Comber, NP  aspirin EC 325 MG tablet Take 325 mg by mouth daily.     [provider]  carisoprodol (SOMA) 350 MG tablet Take 350 mg by mouth every 6 (six) hours as needed for muscle spasms.     [provider]  cholecalciferol (VITAMIN  D3) 25 MCG (1000 UT) tablet Take 5,000 Units by mouth daily.    [provider]  citalopram (CELEXA) 40 MG tablet Take 1 tablet (40 mg total) by mouth daily with lunch. Patient taking differently: Take 20 mg by mouth daily with lunch.  04/08/19   Liane Comber, NP  furosemide (LASIX) 40 MG tablet Take 1 tablet (40 mg total) by mouth 2 (two) times daily. 06/12/19 06/11/20  Georgette Shell, MD  gabapentin (NEURONTIN) 300 MG capsule Take 1 capsule 3 x /day if needed for Pain Patient taking differently: Take 300 mg by mouth 3 (three) times daily.  05/05/19   Unk Pinto, MD  Menthol-Camphor (ICY HOT ADVANCED RELIEF) 16-11 % CREA Apply 1 application topically 4 (four) times daily as needed (pain).     [provider]  oxyCODONE (OXY IR/ROXICODONE) 5 MG immediate release tablet Take 5-10 mg by mouth every 6 (six) hours as needed for moderate pain or severe pain.  05/28/19   [provider]  polyethylene glycol (MIRALAX / GLYCOLAX) 17 g packet Take 17 g by mouth daily as needed. Patient taking differently: Take 17 g by mouth daily as needed for mild constipation.  10/15/18   Shelly Coss, MD  potassium chloride (KLOR-CON) 10 MEQ tablet Take 1 tablet (10 mEq total) by mouth daily. 06/12/19   Georgette Shell, MD  predniSONE (DELTASONE) 10 MG tablet Take 4 tablets daily for first 4 days then 3 tablets daily for the next  4 days then 2 tablets daily for the following 4 days and then 10 mg daily till all the medications are done. 06/12/19   Georgette Shell, MD  rosuvastatin (CRESTOR) 40 MG tablet TAKE 1/2 TO 1 (ONE-HALF TO ONE) TABLET ONCE DAILY OR  AS DIRECTED FOR CHOLESTEROL Patient taking differently: Take 40 mg by mouth daily.  05/30/19   Unk Pinto, MD  sodium chloride (OCEAN) 0.65 % SOLN nasal spray Place 1 spray into both nostrils as needed for congestion. 06/12/19   Georgette Shell, MD  vancomycin IVPB Inject 750 mg into the vein every 8 (eight) hours.  Indication: MRSA bactermia Last Day of Therapy: 07/17/2019 Labs - Sunday/Monday:  CBC/D, CMP, and vancomycin trough. Labs - Thursday:  BMP and vancomycin trough Labs - Every other week:  ESR and CRP 06/12/19 07/18/19  Georgette Shell, MD

## 2019-06-13 NOTE — Addendum Note (Signed)
Addended by: Vicie Mutters R on: 06/13/2019 01:15 PM   Modules accepted: Orders

## 2019-06-13 NOTE — Telephone Encounter (Signed)
He is getting IV's through [facility] and is being seen by [facility].    I did confirm he was set up with these services from [facility] with Carolynn Sayers the IV infusion nurse.

## 2019-06-14 ENCOUNTER — Encounter: Payer: Self-pay | Admitting: Adult Health

## 2019-06-14 ENCOUNTER — Other Ambulatory Visit: Payer: Self-pay

## 2019-06-14 ENCOUNTER — Ambulatory Visit: Payer: Medicare Other | Admitting: Adult Health

## 2019-06-14 VITALS — BP 124/78 | HR 89 | Temp 98.4°F | Wt 235.0 lb

## 2019-06-14 DIAGNOSIS — J9621 Acute and chronic respiratory failure with hypoxia: Secondary | ICD-10-CM

## 2019-06-14 DIAGNOSIS — J189 Pneumonia, unspecified organism: Secondary | ICD-10-CM | POA: Diagnosis not present

## 2019-06-14 NOTE — Progress Notes (Signed)
Virtual Visit via Telephone Note  I connected with Howard Fox on 06/14/19 at 11:00 AM EST by telephone and verified that I am speaking with the correct person using two identifiers.  Location: Patient: home Provider: [facility]    I discussed the limitations, risks, security and privacy concerns of performing an evaluation and management service by telephone and the availability of in person appointments. I also discussed with the patient that there may be a patient responsible charge related to this service. The patient expressed understanding and agreed to proceed.   History of Present Illness:  BP 124/78   Pulse 89   Temp 98.4 F (36.9 C)   Wt 235 lb (106.6 kg)   SpO2 (!) 88% Comment: RA at rest  BMI 31.87 kg/m   59 y.o. male patient, former smoker, recent discharged from hospital admission due to pneumonia with acute respiratory failure (covid 19 negative), MRSA bacteremia, discharged 06/12/2019 was evaluated urgently prior to his formal hospital follow up visit planned for next week due to oxygen referral/coordination needs.   Per Dr. Landis Fox discharge note: "acute hypoxic respiratory failuremultifactorial-COPD exacerbation/pulmonary edema/pneumonia-initially he was on 15 L of oxygen this was tapered down to 3 L on the day of discharge.  His saturation was 89% on 3 L ...  He will be discharged on 3 L of oxygen. "   On review he has notable chronic respiratory disease, previous LL lobectomy on imaging (CT 10/08/2018).  The patient was contacted on 06/13/2019 by our office to coordinate hospital follow up Howard Fox and was identified recommended home O2 was yet to be coordinated and was arranged for appointment at first opportunity to coordinate this per medicare requirements.   Today he reports pulse oximeter has demonstrated 88% at rest on room air, endorses dyspnea with any ambulation, somewhat short of breath with longer conversations, not in distress but knows he was  supposed to be on oxygen at discharge. [facility] has been visiting him daily at home for vacomycin infusions and checking daily VS.   He is in the process of a prednisone taper, currently taking 40 mg daily, will decrease to 30 mg in 2 days on 06/16/2019. He is taking ipratoprium-albuterol 20-100 mcg/act q6h. He denies cough; does endorse some fatigue. Reports doing well other than above exertional dyspnea.   Current Outpatient Medications on File Prior to Visit  Medication Sig Dispense Refill  . albuterol (VENTOLIN HFA) 108 (90 Base) MCG/ACT inhaler Use 2 Inhalations 15 minutes apart every 4 hours to Rescue Asthma (Patient taking differently: Inhale 2 puffs into the lungs every 4 (four) hours as needed for wheezing or shortness of breath. ) 48 g 3  . ALPRAZolam (XANAX) 0.5 MG tablet Take 1 tablet (0.5 mg total) by mouth 3 (three) times daily as needed for anxiety. 30 tablet 0  . amitriptyline (ELAVIL) 150 MG tablet TAKE 1 TABLET BY MOUTH AT BEDTIME FOR SLEEP AND MOOD (Patient taking differently: Take 150 mg by mouth at bedtime. ) 90 tablet 1  . aspirin EC 325 MG tablet Take 325 mg by mouth daily.     . carisoprodol (SOMA) 350 MG tablet Take 350 mg by mouth every 6 (six) hours as needed for muscle spasms.     . cholecalciferol (VITAMIN D3) 25 MCG (1000 UT) tablet Take 5,000 Units by mouth daily.    . citalopram (CELEXA) 40 MG tablet Take 1 tablet (40 mg total) by mouth daily with lunch. (Patient taking differently: Take 20  mg by mouth daily with lunch. ) 90 tablet 1  . furosemide (LASIX) 40 MG tablet Take 1 tablet (40 mg total) by mouth 2 (two) times daily. 30 tablet 11  . gabapentin (NEURONTIN) 300 MG capsule Take 1 capsule 3 x /day if needed for Pain (Patient taking differently: Take 300 mg by mouth 3 (three) times daily. ) 270 capsule 0  . Ipratropium-Albuterol (COMBIVENT) 20-100 MCG/ACT AERS respimat Inhale 1 puff into the lungs every 6 (six) hours.    . Menthol-Camphor (ICY HOT ADVANCED  RELIEF) 16-11 % CREA Apply 1 application topically 4 (four) times daily as needed (pain).     [facility] [facility] oxyCODONE (OXY IR/ROXICODONE) 5 MG immediate release tablet Take 5-10 mg by mouth every 6 (six) hours as needed for moderate pain or severe pain.     . polyethylene glycol (MIRALAX / GLYCOLAX) 17 g packet Take 17 g by mouth daily as needed. (Patient taking differently: Take 17 g by mouth daily as needed for mild constipation. ) 14 each 0  . potassium chloride (KLOR-CON) 10 MEQ tablet Take 1 tablet (10 mEq total) by mouth daily. 30 tablet 0  . predniSONE (DELTASONE) 10 MG tablet Take 4 tablets daily for first 4 days then 3 tablets daily for the next 4 days then 2 tablets daily for the following 4 days and then 10 mg daily till all the medications are done. 40 tablet 0  . rosuvastatin (CRESTOR) 40 MG tablet TAKE 1/2 TO 1 (ONE-HALF TO ONE) TABLET ONCE DAILY OR  AS DIRECTED FOR CHOLESTEROL (Patient taking differently: Take 40 mg by mouth daily. ) 90 tablet 0  . sodium chloride (OCEAN) 0.65 % SOLN nasal spray Place 1 spray into both nostrils as needed for congestion.  0  . vancomycin IVPB Inject 750 mg into the vein every 8 (eight) hours. Indication: MRSA bactermia Last Day of Therapy: 07/17/2019 Labs - Sunday/Monday:  CBC/D, CMP, and vancomycin trough. Labs - Thursday:  BMP and vancomycin trough Labs - Every other week:  ESR and CRP 37 Units 0   No current facility-administered medications on file prior to visit.     Allergies:  Allergies  Allergen Reactions  . Codeine Itching  . Dilaudid [Hydromorphone Hcl] Itching  . Shellfish Allergy Diarrhea and Nausea And Vomiting   Medical History:  has Bipolar disorder ([place]); Essential hypertension; Hyperlipidemia, mixed; Vitamin D deficiency; Medication management; Other abnormal glucose; Obesity (BMI 30.0-34.9); Hepatitis C, chronic ([place]); Tobacco use disorder; History of prediabetes; Diskitis; MRSA bacteremia; History of cocaine use; Marijuana smoker;  Vertebral osteomyelitis ([place]); Anxiety; Postoperative anemia due to acute blood loss; Low blood pressure; H/O intravenous drug use in remission; Back pain; Aortic atherosclerosis ([place]); Community acquired pneumonia; Fever; Tachypnea; Dyspnea; Chronic pain syndrome; and Pneumonia on their problem list. Surgical History:  He  has a past surgical history that includes Cholecystectomy; Lithotripsy; laser surgery for kidney stones (Left); and Laminectomy with posterior lateral arthrodesis level 4 (N/A, 02/01/2019). Family History:  Hisfamily history includes Arthritis in his father; Mental illness in his mother; Stroke in his father. Social History:   reports that he quit smoking 9 days ago. His smoking use included cigarettes. He started smoking about 42 years ago. He has a 21.00 pack-year smoking history. He has never used smokeless tobacco. He reports current alcohol use. He reports current drug use. Frequency: 1.00 time per week. Drug: Marijuana.   Observations/Objective:  General : Well sounding patient in no acute distress  HEENT: no hoarseness, no cough for duration  of visit Lungs: speaks in incomplete sentences, taking breaths with each phrase, no audible wheezing, no apparent distress Neurological: alert, oriented x 3 Psychiatric: pleasant, judgement appropriate   Assessment and Plan:  Diagnoses and all orders for this visit:  Acute and chronic respiratory failure with hypoxia (HCC) CAP Initiate 3L O2 at home to maintain O2 saturations above 92% at rest Reviewed with patient to monitor O2 via pulse oximeter; if O2 is trending up 96%+ can try tapering down to 2L.  [facility] is visiting daily for vanc infusions and VS monitoring Coordinate for portable as will need this for his follow up appointments, scheduled for in office visit on 06/19/2019 and can complete full follow up O2 walk test at that time The patient was advised to call immediately if he has any concerning symptoms  in the interval. The patient voices understanding of current treatment options and is in agreement with the current care plan.The patient knows to call the clinic with any problems, questions or concerns or go to the ER if any further progression of symptoms.  -     For home use only DME oxygen   Follow Up Instructions:    I discussed the assessment and treatment plan with the patient. The patient was provided an opportunity to ask questions and all were answered. The patient agreed with the plan and demonstrated an understanding of the instructions.   The patient was advised to call back or seek an in-person evaluation if the symptoms worsen or if the condition fails to improve as anticipated.  I provided 15 minutes of non-face-to-face time during this encounter.   Izora Ribas, NP

## 2019-06-17 DIAGNOSIS — B9562 Methicillin resistant Staphylococcus aureus infection as the cause of diseases classified elsewhere: Secondary | ICD-10-CM | POA: Diagnosis not present

## 2019-06-17 DIAGNOSIS — M4620 Osteomyelitis of vertebra, site unspecified: Secondary | ICD-10-CM | POA: Diagnosis not present

## 2019-06-17 DIAGNOSIS — J441 Chronic obstructive pulmonary disease with (acute) exacerbation: Secondary | ICD-10-CM | POA: Diagnosis not present

## 2019-06-17 DIAGNOSIS — J440 Chronic obstructive pulmonary disease with acute lower respiratory infection: Secondary | ICD-10-CM | POA: Diagnosis not present

## 2019-06-17 DIAGNOSIS — R7881 Bacteremia: Secondary | ICD-10-CM | POA: Diagnosis not present

## 2019-06-17 DIAGNOSIS — J189 Pneumonia, unspecified organism: Secondary | ICD-10-CM | POA: Diagnosis not present

## 2019-06-17 DIAGNOSIS — B182 Chronic viral hepatitis C: Secondary | ICD-10-CM | POA: Diagnosis not present

## 2019-06-18 NOTE — Progress Notes (Signed)
Hospital follow up  Assessment and Plan: Hospital visit follow up for  Community acquired pneumonia, unspecified laterality With 3 L oxygen via [place] at home, saturation is 88%   4L via [place] he is at 90-93%.  With walking he dropped down to 85% with 3L, he increased to 5 L with walking to reach 91%.  At rest at 5 L he is at 93-96%- we discussed avoiding this due to carbon dioxide.   Acute and chronic respiratory failure with hypoxia ([place]) He is on 3L of oxygen at home, will have oxygen saturation of 89%, at 4L he is at 90-93%. With walking he dropped down to 85% with 3L, he increased to 5 L with walking to reach 91%. At rest at 5 L he is at 93-96%.  MRSA bacteremia Continue vancomycin Has follow up with ID feb 16th  Fever, unspecified fever cause Possible from the vancomycin infection, will get labs to see if CBC better, may need to repeat CXR     All medications were reviewed with patient and family and fully reconciled. All questions answered fully, and patient and family members were encouraged to call the office with any further questions or concerns. Discussed goal to avoid readmission related to this diagnosis.  There are no discontinued medications.  Over 40 minutes of exam, counseling, chart review, and complex, high/moderate level critical decision making was performed this visit.   Future Appointments  Date Time Provider [place]  06/19/2019  2:30 PM Vicie Mutters, PA-C [facility] None  07/09/2019  2:15 PM Carlyle Basques, MD [facility]  07/16/2019  4:00 PM Unk Pinto, MD [facility] None  10/30/2019  3:00 PM Unk Pinto, MD [facility] None  04/08/2020 11:15 AM Liane Comber, NP [facility] None     HPI 59 y.o.male presents for follow up for transition from recent hospitalization or SNIF stay. Admit date to the hospital was 06/02/19, patient was discharged from the hospital on 06/12/19 and our clinical staff contacted the office the day after discharge to  set up a follow up appointment. The discharge summary, medications, and diagnostic test results were reviewed before meeting with the patient. The patient was admitted for:   Per Dr. Landis Gandy discharge note: "acute hypoxic respiratory failuremultifactorial-COPD exacerbation/pulmonary edema/pneumonia"-discharged on 3 L of oxygen which our office helped coordinate. His blood cultures showed MRSA bacteremia, he is getting vancomycin infusions for 3 weeks post discharge with [facility] home and daily VS.  He is in the process of a prednisone taper, currently taking 9m daily and then will go down to 211min 2 days. States it is decreasing his sleep.  He is taking ipratoprium-albuterol 20-100 mcg/act q6h. He is developing  cough; does endorse some fatigue and has continuing DOE with 10-15 feet.  He is on 3L of oxygen at home, will have oxygen saturation of 89%, at 4L he is at 90-93%. With walking he dropped down to 85% with 3L, he increased to 5 L with walking to reach 91%. At rest at 5 L he is at 93-96%. [employer] oxygen.  He has PICC line right arm.  He had a temperature of 101 last night, took 2 ibuprofen, has been sweating and 99.3 this AM. Had 2 vancomycin infusions yesterday and one this AM.  [facility] a [facility], we do not have the results, will request them.   He has not smoked since discharge.   Echo shows normal ejection fraction.Left ventricular ejection fraction, by visual estimation, is 55 to 60%.  The left ventricle has normal function. There is no left ventricular hypertrophy. He continues to endorse dyspnea with any ambulation, somewhat short of breath with longer conversations, not in distress but knows he was supposed to be on oxygen at discharge.   Lab Results  Component Value Date   WBC 18.3 (H) 06/12/2019   HGB 14.3 06/12/2019   HCT 44.1 06/12/2019   MCV 88.6 06/12/2019   PLT 692 (H) 06/12/2019   Lab Results  Component Value Date   CREATININE  0.84 06/12/2019   BUN 28 (H) 06/12/2019   NA 132 (L) 06/12/2019   K 5.0 06/12/2019   CL 95 (L) 06/12/2019   CO2 28 06/12/2019   Lab Results  Component Value Date   ALT 72 (H) 06/12/2019   AST 38 06/12/2019   ALKPHOS 115 06/12/2019   BILITOT 0.4 06/12/2019    Home health is involved.   Images while in the hospital: [facility] Chest Port 1 View  Result Date: 06/02/2019 CLINICAL DATA:  Fever, shortness of breath EXAM: PORTABLE CHEST 1 VIEW COMPARISON:  11/25/2018 FINDINGS: Heart is borderline in size. Mild vascular congestion. Bilateral perihilar opacities. Left basilar opacities. Findings could reflect edema or infection. No visible significant effusions or acute bony abnormality. IMPRESSION: Borderline heart size. Vascular congestion with bilateral perihilar and left lower lobe opacities, edema versus infection. Electronically Signed   By: Rolm Baptise M.D.   On: 06/02/2019 22:58     Current Outpatient Medications (Endocrine & Metabolic):  .  predniSONE (DELTASONE) 10 MG tablet, Take 4 tablets daily for first 4 days then 3 tablets daily for the next 4 days then 2 tablets daily for the following 4 days and then 10 mg daily till all the medications are done.  Current Outpatient Medications (Cardiovascular):  .  furosemide (LASIX) 40 MG tablet, Take 1 tablet (40 mg total) by mouth 2 (two) times daily. .  rosuvastatin (CRESTOR) 40 MG tablet, TAKE 1/2 TO 1 (ONE-HALF TO ONE) TABLET ONCE DAILY OR  AS DIRECTED FOR CHOLESTEROL (Patient taking differently: Take 40 mg by mouth daily. )  Current Outpatient Medications (Respiratory):  .  albuterol (VENTOLIN HFA) 108 (90 Base) MCG/ACT inhaler, Use 2 Inhalations 15 minutes apart every 4 hours to Rescue Asthma (Patient taking differently: Inhale 2 puffs into the lungs every 4 (four) hours as needed for wheezing or shortness of breath. ) .  Ipratropium-Albuterol (COMBIVENT) 20-100 MCG/ACT AERS respimat, Inhale 1 puff into the lungs every 6 (six) hours. .   sodium chloride (OCEAN) 0.65 % SOLN nasal spray, Place 1 spray into both nostrils as needed for congestion.  Current Outpatient Medications (Analgesics):  .  aspirin EC 325 MG tablet, Take 325 mg by mouth daily.  Marland Kitchen  oxyCODONE (OXY IR/ROXICODONE) 5 MG immediate release tablet, Take 5-10 mg by mouth every 6 (six) hours as needed for moderate pain or severe pain.    Current Outpatient Medications (Other):  Marland Kitchen  ALPRAZolam (XANAX) 0.5 MG tablet, Take 1 tablet (0.5 mg total) by mouth 3 (three) times daily as needed for anxiety. Marland Kitchen  amitriptyline (ELAVIL) 150 MG tablet, TAKE 1 TABLET BY MOUTH AT BEDTIME FOR SLEEP AND MOOD (Patient taking differently: Take 150 mg by mouth at bedtime. ) .  carisoprodol (SOMA) 350 MG tablet, Take 350 mg by mouth every 6 (six) hours as needed for muscle spasms.  .  cholecalciferol (VITAMIN D3) 25 MCG (1000 UT) tablet, Take 5,000 Units by mouth daily. .  citalopram (CELEXA) 40 MG tablet, Take  1 tablet (40 mg total) by mouth daily with lunch. (Patient taking differently: Take 20 mg by mouth daily with lunch. ) .  gabapentin (NEURONTIN) 300 MG capsule, Take 1 capsule 3 x /day if needed for Pain (Patient taking differently: Take 300 mg by mouth 3 (three) times daily. ) .  Menthol-Camphor (ICY HOT ADVANCED RELIEF) 16-11 % CREA, Apply 1 application topically 4 (four) times daily as needed (pain).  .  polyethylene glycol (MIRALAX / GLYCOLAX) 17 g packet, Take 17 g by mouth daily as needed. (Patient taking differently: Take 17 g by mouth daily as needed for mild constipation. ) .  potassium chloride (KLOR-CON) 10 MEQ tablet, Take 1 tablet (10 mEq total) by mouth daily. .  vancomycin IVPB, Inject 750 mg into the vein every 8 (eight) hours. Indication: MRSA bactermia Last Day of Therapy: 07/17/2019 Labs - Sunday/Monday:  CBC/D, CMP, and vancomycin trough. Labs - Thursday:  BMP and vancomycin trough Labs - Every other week:  ESR and CRP  Past Medical History:  Diagnosis Date  . Anxiety  10/25/2018  . Arthritis   . Bipolar 1 disorder ([place])   . Depression   . Fracture, ribs 1993   ruptured stomach, left lung resulted from industrial fall  . Heart murmur    as a child  . Hepatitis C   . History of kidney stones   . Hypertension   . Kidney stones   . Low blood pressure 02/20/2019  . MRSA bacteremia 10/09/2018  . S/P lumbar fusion 02/01/2019  . Vertebral osteomyelitis ([place]) 10/25/2018     Allergies  Allergen Reactions  . Codeine Itching  . Dilaudid [Hydromorphone Hcl] Itching  . Shellfish Allergy Diarrhea and Nausea And Vomiting    ROS: all negative except above.   Physical Exam: There were no vitals filed for this visit. There were no vitals taken for this visit. General Appearance: Well nourished, in no apparent distress. Eyes: PERRLA, EOMs, conjunctiva no swelling or erythema Sinuses: No Frontal/maxillary tenderness ENT/Mouth: Ext aud canals clear, TMs without erythema, bulging. No erythema, swelling, or exudate on post pharynx.  Tonsils not swollen or erythematous. Hearing normal.  Neck: Supple, thyroid normal.  Respiratory: Respiratory increased effort,some accessory muscle usage after walking, that improves with rest,  BS equal bilaterally with inspiratory wheezing right lower and middle lobe and left lower lobe without rales.  Cardio: RRR with no MRGs. Brisk peripheral pulses without edema.  Abdomen: Soft, + BS.  Non tender, no guarding, rebound, hernias, masses. Musculoskeletal: Full ROM, 4/5 strength, slow gait. Skin: Warm, dry without rashes, lesions, ecchymosis.  Neuro: Cranial nerves intact. Normal muscle tone, no cerebellar symptoms.  Psych: Awake and oriented X 3, normal affect, Insight and Judgment appropriate.     Vicie Mutters, PA-C 8:38 AM [facility]

## 2019-06-19 ENCOUNTER — Encounter: Payer: Self-pay | Admitting: Physician Assistant

## 2019-06-19 ENCOUNTER — Ambulatory Visit (INDEPENDENT_AMBULATORY_CARE_PROVIDER_SITE_OTHER): Payer: Medicare Other | Admitting: Physician Assistant

## 2019-06-19 ENCOUNTER — Other Ambulatory Visit: Payer: Self-pay

## 2019-06-19 VITALS — BP 142/86 | HR 113 | Temp 97.5°F | Wt 229.0 lb

## 2019-06-19 DIAGNOSIS — J9621 Acute and chronic respiratory failure with hypoxia: Secondary | ICD-10-CM

## 2019-06-19 DIAGNOSIS — R7881 Bacteremia: Secondary | ICD-10-CM

## 2019-06-19 DIAGNOSIS — J189 Pneumonia, unspecified organism: Secondary | ICD-10-CM | POA: Diagnosis not present

## 2019-06-19 DIAGNOSIS — R509 Fever, unspecified: Secondary | ICD-10-CM | POA: Diagnosis not present

## 2019-06-19 DIAGNOSIS — B9562 Methicillin resistant Staphylococcus aureus infection as the cause of diseases classified elsewhere: Secondary | ICD-10-CM

## 2019-06-19 NOTE — Patient Instructions (Addendum)
Please get the labs from [facility] Please let the infusion nurse about the fever, need the labs, can be with vancomycin or we need to repeat CXR Go to the ER if any chest pain, shortness of breath, nausea, dizziness, severe HA, changes vision/speech   Hypoxia Hypoxia is a condition that happens when there is a lack of oxygen in the body's tissues and organs. When there is not enough oxygen, organs cannot work as they should. This causes serious problems throughout the body and in the brain. What are the causes? This condition may be caused by:  Exposure to high altitude.  A collapsed lung (pneumothorax).  Lung infection (pneumonia).  Lung injury.  Long-term (chronic) lung disease, such as COPD (chronic obstructive pulmonary disease).  Blood collecting in the chest cavity (hemothorax).  Food, saliva, or vomit getting into the airway (aspiration).  Reduced blood flow (ischemia).  Severe blood loss.  Slow or shallow breathing (hypoventilation).  Blood disorders, such as anemia.  Carbon monoxide poisoning.  The heart suddenly stopping (cardiac arrest).  Anesthetic medicines.  Drowning.  Choking. What are the signs or symptoms? Symptoms of this condition include:  Headache.  Fatigue.  Drowsiness.  Forgetfulness.  Nausea.  Confusion.  Shortness of breath.  Dizziness.  Bluish color of the skin, lips, or nail beds (cyanosis).  Change in consciousness or awareness. If hypoxia is not treated, it can lead to convulsions, loss of consciousness (coma), or brain damage. How is this diagnosed? This condition may be diagnosed based on:  A physical exam.  Blood tests.  A test that measures how much oxygen is in your blood (pulse oximetry). This is done with a sensor that is placed on your finger, toe, or earlobe.  Chest X-ray.  Tests to check your lung function (pulmonary function tests).  A test to check the electrical activity of your heart  (electrocardiogram, ECG). You may have other tests to determine the cause of your hypoxia. How is this treated?  Treatment for this condition depends on what is causing the hypoxia. You will likely be treated with oxygen therapy. This may be done by giving you oxygen through a face mask or through tubes in your nose. Your health care provider may also recommend other therapies to treat the underlying cause of your hypoxia. Follow these instructions at home:  Take over-the-counter and prescription medicines only as told by your health care provider.  Do not use any products that contain nicotine or tobacco, such as cigarettes and e-cigarettes. If you need help quitting, ask your health care provider.  Avoid secondhand smoke.  Work with your health care provider to manage any chronic conditions you have that may be causing hypoxia, such as COPD.  Keep all follow-up visits as told by your health care provider. This is important. Contact a health care provider if:  You have a fever.  You have trouble breathing, even after treatment.  You become extremely short of breath when you exercise. Get help right away if:  Your shortness of breath gets worse, especially with normal or very little activity.  Your skin, lips, or nail beds have a bluish color.  You become confused or you cannot think properly.  You have chest pain. Summary  Hypoxia is a condition that happens when there is a lack of oxygen in the body's tissues and organs.  If hypoxia is not treated, it can lead to convulsions, loss of consciousness (coma), or brain damage.  Symptoms of hypoxia can include a headache, shortness  of breath, confusion, nausea, and a bluish skin color.  Hypoxia has many possible causes, including exposure to high altitude, carbon monoxide poisoning, or other health issues, such as blood disorders or cardiac arrest.  Hypoxia is usually treated with oxygen therapy. This information is not  intended to replace advice given to you by your health care provider. Make sure you discuss any questions you have with your health care provider. Document Revised: 04/21/2017 Document Reviewed: 06/27/2016 Elsevier Patient Education  2020 [employer].

## 2019-06-20 DIAGNOSIS — Z5181 Encounter for therapeutic drug level monitoring: Secondary | ICD-10-CM | POA: Diagnosis not present

## 2019-06-20 DIAGNOSIS — B9562 Methicillin resistant Staphylococcus aureus infection as the cause of diseases classified elsewhere: Secondary | ICD-10-CM | POA: Diagnosis not present

## 2019-06-20 DIAGNOSIS — J189 Pneumonia, unspecified organism: Secondary | ICD-10-CM | POA: Diagnosis not present

## 2019-06-20 DIAGNOSIS — J440 Chronic obstructive pulmonary disease with acute lower respiratory infection: Secondary | ICD-10-CM | POA: Diagnosis not present

## 2019-06-20 DIAGNOSIS — R7881 Bacteremia: Secondary | ICD-10-CM | POA: Diagnosis not present

## 2019-06-20 DIAGNOSIS — J441 Chronic obstructive pulmonary disease with (acute) exacerbation: Secondary | ICD-10-CM | POA: Diagnosis not present

## 2019-06-20 DIAGNOSIS — R7989 Other specified abnormal findings of blood chemistry: Secondary | ICD-10-CM | POA: Diagnosis not present

## 2019-06-20 DIAGNOSIS — M4620 Osteomyelitis of vertebra, site unspecified: Secondary | ICD-10-CM | POA: Diagnosis not present

## 2019-06-22 ENCOUNTER — Encounter (HOSPITAL_COMMUNITY): Payer: Self-pay | Admitting: Emergency Medicine

## 2019-06-22 ENCOUNTER — Emergency Department (HOSPITAL_COMMUNITY): Payer: Medicare Other

## 2019-06-22 ENCOUNTER — Inpatient Hospital Stay (HOSPITAL_COMMUNITY)
Admission: EM | Admit: 2019-06-22 | Discharge: 2019-07-22 | DRG: 208 | Disposition: E | Payer: Medicare Other | Attending: Internal Medicine | Admitting: Internal Medicine

## 2019-06-22 ENCOUNTER — Other Ambulatory Visit: Payer: Self-pay

## 2019-06-22 DIAGNOSIS — R451 Restlessness and agitation: Secondary | ICD-10-CM | POA: Diagnosis not present

## 2019-06-22 DIAGNOSIS — Z79899 Other long term (current) drug therapy: Secondary | ICD-10-CM

## 2019-06-22 DIAGNOSIS — J189 Pneumonia, unspecified organism: Secondary | ICD-10-CM | POA: Diagnosis not present

## 2019-06-22 DIAGNOSIS — Z20822 Contact with and (suspected) exposure to covid-19: Secondary | ICD-10-CM | POA: Diagnosis present

## 2019-06-22 DIAGNOSIS — H04123 Dry eye syndrome of bilateral lacrimal glands: Secondary | ICD-10-CM | POA: Diagnosis present

## 2019-06-22 DIAGNOSIS — E669 Obesity, unspecified: Secondary | ICD-10-CM | POA: Diagnosis not present

## 2019-06-22 DIAGNOSIS — E559 Vitamin D deficiency, unspecified: Secondary | ICD-10-CM | POA: Diagnosis present

## 2019-06-22 DIAGNOSIS — Z0184 Encounter for antibody response examination: Secondary | ICD-10-CM

## 2019-06-22 DIAGNOSIS — Z515 Encounter for palliative care: Secondary | ICD-10-CM | POA: Diagnosis not present

## 2019-06-22 DIAGNOSIS — F319 Bipolar disorder, unspecified: Secondary | ICD-10-CM | POA: Diagnosis present

## 2019-06-22 DIAGNOSIS — R6520 Severe sepsis without septic shock: Secondary | ICD-10-CM

## 2019-06-22 DIAGNOSIS — J9601 Acute respiratory failure with hypoxia: Secondary | ICD-10-CM | POA: Diagnosis present

## 2019-06-22 DIAGNOSIS — Z6830 Body mass index (BMI) 30.0-30.9, adult: Secondary | ICD-10-CM

## 2019-06-22 DIAGNOSIS — F17210 Nicotine dependence, cigarettes, uncomplicated: Secondary | ICD-10-CM | POA: Diagnosis present

## 2019-06-22 DIAGNOSIS — R7881 Bacteremia: Secondary | ICD-10-CM | POA: Diagnosis not present

## 2019-06-22 DIAGNOSIS — Y95 Nosocomial condition: Secondary | ICD-10-CM | POA: Diagnosis present

## 2019-06-22 DIAGNOSIS — Z789 Other specified health status: Secondary | ICD-10-CM

## 2019-06-22 DIAGNOSIS — D75839 Thrombocytosis, unspecified: Secondary | ICD-10-CM

## 2019-06-22 DIAGNOSIS — F419 Anxiety disorder, unspecified: Secondary | ICD-10-CM | POA: Diagnosis present

## 2019-06-22 DIAGNOSIS — R918 Other nonspecific abnormal finding of lung field: Secondary | ICD-10-CM | POA: Diagnosis not present

## 2019-06-22 DIAGNOSIS — F1290 Cannabis use, unspecified, uncomplicated: Secondary | ICD-10-CM | POA: Diagnosis present

## 2019-06-22 DIAGNOSIS — J8489 Other specified interstitial pulmonary diseases: Secondary | ICD-10-CM | POA: Diagnosis present

## 2019-06-22 DIAGNOSIS — E782 Mixed hyperlipidemia: Secondary | ICD-10-CM | POA: Diagnosis not present

## 2019-06-22 DIAGNOSIS — Z66 Do not resuscitate: Secondary | ICD-10-CM | POA: Diagnosis not present

## 2019-06-22 DIAGNOSIS — R4781 Slurred speech: Secondary | ICD-10-CM | POA: Diagnosis not present

## 2019-06-22 DIAGNOSIS — Z4682 Encounter for fitting and adjustment of non-vascular catheter: Secondary | ICD-10-CM | POA: Diagnosis not present

## 2019-06-22 DIAGNOSIS — Z7189 Other specified counseling: Secondary | ICD-10-CM | POA: Diagnosis not present

## 2019-06-22 DIAGNOSIS — Z8614 Personal history of Methicillin resistant Staphylococcus aureus infection: Secondary | ICD-10-CM

## 2019-06-22 DIAGNOSIS — Z7982 Long term (current) use of aspirin: Secondary | ICD-10-CM

## 2019-06-22 DIAGNOSIS — J80 Acute respiratory distress syndrome: Secondary | ICD-10-CM | POA: Diagnosis not present

## 2019-06-22 DIAGNOSIS — Z9289 Personal history of other medical treatment: Secondary | ICD-10-CM

## 2019-06-22 DIAGNOSIS — J9621 Acute and chronic respiratory failure with hypoxia: Secondary | ICD-10-CM | POA: Diagnosis not present

## 2019-06-22 DIAGNOSIS — Z792 Long term (current) use of antibiotics: Secondary | ICD-10-CM

## 2019-06-22 DIAGNOSIS — R0603 Acute respiratory distress: Secondary | ICD-10-CM | POA: Diagnosis not present

## 2019-06-22 DIAGNOSIS — E119 Type 2 diabetes mellitus without complications: Secondary | ICD-10-CM | POA: Diagnosis present

## 2019-06-22 DIAGNOSIS — I700 Atherosclerosis of aorta: Secondary | ICD-10-CM | POA: Diagnosis present

## 2019-06-22 DIAGNOSIS — J849 Interstitial pulmonary disease, unspecified: Secondary | ICD-10-CM | POA: Diagnosis not present

## 2019-06-22 DIAGNOSIS — B182 Chronic viral hepatitis C: Secondary | ICD-10-CM | POA: Diagnosis present

## 2019-06-22 DIAGNOSIS — J810 Acute pulmonary edema: Secondary | ICD-10-CM | POA: Diagnosis not present

## 2019-06-22 DIAGNOSIS — Z885 Allergy status to narcotic agent status: Secondary | ICD-10-CM

## 2019-06-22 DIAGNOSIS — R0600 Dyspnea, unspecified: Secondary | ICD-10-CM

## 2019-06-22 DIAGNOSIS — A419 Sepsis, unspecified organism: Secondary | ICD-10-CM | POA: Diagnosis not present

## 2019-06-22 DIAGNOSIS — G894 Chronic pain syndrome: Secondary | ICD-10-CM | POA: Diagnosis present

## 2019-06-22 DIAGNOSIS — F1010 Alcohol abuse, uncomplicated: Secondary | ICD-10-CM | POA: Diagnosis present

## 2019-06-22 DIAGNOSIS — G9341 Metabolic encephalopathy: Secondary | ICD-10-CM | POA: Diagnosis present

## 2019-06-22 DIAGNOSIS — Z818 Family history of other mental and behavioral disorders: Secondary | ICD-10-CM

## 2019-06-22 DIAGNOSIS — F1990 Other psychoactive substance use, unspecified, uncomplicated: Secondary | ICD-10-CM | POA: Diagnosis not present

## 2019-06-22 DIAGNOSIS — Z8739 Personal history of other diseases of the musculoskeletal system and connective tissue: Secondary | ICD-10-CM | POA: Diagnosis not present

## 2019-06-22 DIAGNOSIS — J9600 Acute respiratory failure, unspecified whether with hypoxia or hypercapnia: Secondary | ICD-10-CM | POA: Diagnosis not present

## 2019-06-22 DIAGNOSIS — R454 Irritability and anger: Secondary | ICD-10-CM | POA: Diagnosis not present

## 2019-06-22 DIAGNOSIS — J440 Chronic obstructive pulmonary disease with acute lower respiratory infection: Secondary | ICD-10-CM | POA: Diagnosis present

## 2019-06-22 DIAGNOSIS — G9340 Encephalopathy, unspecified: Secondary | ICD-10-CM | POA: Diagnosis not present

## 2019-06-22 DIAGNOSIS — J811 Chronic pulmonary edema: Secondary | ICD-10-CM | POA: Diagnosis not present

## 2019-06-22 DIAGNOSIS — R404 Transient alteration of awareness: Secondary | ICD-10-CM | POA: Diagnosis not present

## 2019-06-22 DIAGNOSIS — R000 Tachycardia, unspecified: Secondary | ICD-10-CM | POA: Diagnosis not present

## 2019-06-22 DIAGNOSIS — B9562 Methicillin resistant Staphylococcus aureus infection as the cause of diseases classified elsewhere: Secondary | ICD-10-CM | POA: Diagnosis not present

## 2019-06-22 DIAGNOSIS — Z95828 Presence of other vascular implants and grafts: Secondary | ICD-10-CM | POA: Diagnosis not present

## 2019-06-22 DIAGNOSIS — B957 Other staphylococcus as the cause of diseases classified elsewhere: Secondary | ICD-10-CM | POA: Diagnosis present

## 2019-06-22 DIAGNOSIS — J449 Chronic obstructive pulmonary disease, unspecified: Secondary | ICD-10-CM | POA: Diagnosis not present

## 2019-06-22 DIAGNOSIS — Z8619 Personal history of other infectious and parasitic diseases: Secondary | ICD-10-CM | POA: Diagnosis not present

## 2019-06-22 DIAGNOSIS — K5900 Constipation, unspecified: Secondary | ICD-10-CM | POA: Diagnosis present

## 2019-06-22 DIAGNOSIS — E1165 Type 2 diabetes mellitus with hyperglycemia: Secondary | ICD-10-CM | POA: Diagnosis not present

## 2019-06-22 DIAGNOSIS — F3112 Bipolar disorder, current episode manic without psychotic features, moderate: Secondary | ICD-10-CM | POA: Diagnosis not present

## 2019-06-22 DIAGNOSIS — Z9049 Acquired absence of other specified parts of digestive tract: Secondary | ICD-10-CM

## 2019-06-22 DIAGNOSIS — E66811 Obesity, class 1: Secondary | ICD-10-CM | POA: Diagnosis present

## 2019-06-22 DIAGNOSIS — Z981 Arthrodesis status: Secondary | ICD-10-CM

## 2019-06-22 DIAGNOSIS — I10 Essential (primary) hypertension: Secondary | ICD-10-CM | POA: Diagnosis not present

## 2019-06-22 DIAGNOSIS — Z902 Acquired absence of lung [part of]: Secondary | ICD-10-CM

## 2019-06-22 DIAGNOSIS — Z9981 Dependence on supplemental oxygen: Secondary | ICD-10-CM

## 2019-06-22 DIAGNOSIS — R339 Retention of urine, unspecified: Secondary | ICD-10-CM | POA: Diagnosis not present

## 2019-06-22 DIAGNOSIS — D473 Essential (hemorrhagic) thrombocythemia: Secondary | ICD-10-CM

## 2019-06-22 DIAGNOSIS — R0902 Hypoxemia: Secondary | ICD-10-CM | POA: Diagnosis not present

## 2019-06-22 DIAGNOSIS — J441 Chronic obstructive pulmonary disease with (acute) exacerbation: Secondary | ICD-10-CM | POA: Diagnosis not present

## 2019-06-22 DIAGNOSIS — Z91013 Allergy to seafood: Secondary | ICD-10-CM

## 2019-06-22 DIAGNOSIS — R05 Cough: Secondary | ICD-10-CM | POA: Diagnosis not present

## 2019-06-22 DIAGNOSIS — I361 Nonrheumatic tricuspid (valve) insufficiency: Secondary | ICD-10-CM | POA: Diagnosis not present

## 2019-06-22 LAB — POCT I-STAT 7, (LYTES, BLD GAS, ICA,H+H)
Bicarbonate: 25.5 mmol/L (ref 20.0–28.0)
Calcium, Ion: 1.25 mmol/L (ref 1.15–1.40)
HCT: 41 % (ref 39.0–52.0)
Hemoglobin: 13.9 g/dL (ref 13.0–17.0)
O2 Saturation: 94 %
Patient temperature: 98.1
Potassium: 3.8 mmol/L (ref 3.5–5.1)
Sodium: 136 mmol/L (ref 135–145)
TCO2: 27 mmol/L (ref 22–32)
pCO2 arterial: 41.6 mmHg (ref 32.0–48.0)
pH, Arterial: 7.395 (ref 7.350–7.450)
pO2, Arterial: 69 mmHg — ABNORMAL LOW (ref 83.0–108.0)

## 2019-06-22 LAB — COMPREHENSIVE METABOLIC PANEL
ALT: 61 U/L — ABNORMAL HIGH (ref 0–44)
AST: 38 U/L (ref 15–41)
Albumin: 2.3 g/dL — ABNORMAL LOW (ref 3.5–5.0)
Alkaline Phosphatase: 126 U/L (ref 38–126)
Anion gap: 12 (ref 5–15)
BUN: 18 mg/dL (ref 6–20)
CO2: 25 mmol/L (ref 22–32)
Calcium: 8.9 mg/dL (ref 8.9–10.3)
Chloride: 101 mmol/L (ref 98–111)
Creatinine, Ser: 0.98 mg/dL (ref 0.61–1.24)
GFR calc Af Amer: >60 mL/min (ref >60)
GFR calc non Af Amer: >60 mL/min (ref >60)
Glucose, Bld: 147 mg/dL — ABNORMAL HIGH (ref 70–99)
Potassium: 3.9 mmol/L (ref 3.5–5.1)
Sodium: 138 mmol/L (ref 135–145)
Total Bilirubin: 0.3 mg/dL (ref 0.3–1.2)
Total Protein: 6.8 g/dL (ref 6.5–8.1)

## 2019-06-22 LAB — CBC WITH DIFFERENTIAL/PLATELET
Abs Immature Granulocytes: 0 10*3/uL (ref 0.00–0.07)
Basophils Absolute: 0 10*3/uL (ref 0.0–0.1)
Basophils Relative: 0 %
Eosinophils Absolute: 0 10*3/uL (ref 0.0–0.5)
Eosinophils Relative: 0 %
HCT: 42.3 % (ref 39.0–52.0)
Hemoglobin: 13.7 g/dL (ref 13.0–17.0)
Lymphocytes Relative: 2 %
Lymphs Abs: 0.6 10*3/uL — ABNORMAL LOW (ref 0.7–4.0)
MCH: 28.6 pg (ref 26.0–34.0)
MCHC: 32.4 g/dL (ref 30.0–36.0)
MCV: 88.3 fL (ref 80.0–100.0)
Monocytes Absolute: 0.6 10*3/uL (ref 0.1–1.0)
Monocytes Relative: 2 %
Neutro Abs: 28.2 10*3/uL — ABNORMAL HIGH (ref 1.7–7.7)
Neutrophils Relative %: 96 %
Platelets: 404 10*3/uL — ABNORMAL HIGH (ref 150–400)
RBC: 4.79 MIL/uL (ref 4.22–5.81)
RDW: 14.8 % (ref 11.5–15.5)
WBC: 29.4 10*3/uL — ABNORMAL HIGH (ref 4.0–10.5)
nRBC: 0 % (ref 0.0–0.2)
nRBC: 0 /100 WBC

## 2019-06-22 LAB — HIV ANTIBODY (ROUTINE TESTING W REFLEX): HIV Screen 4th Generation wRfx: NONREACTIVE

## 2019-06-22 LAB — BRAIN NATRIURETIC PEPTIDE: B Natriuretic Peptide: 217.3 pg/mL — ABNORMAL HIGH (ref 0.0–100.0)

## 2019-06-22 LAB — RESPIRATORY PANEL BY RT PCR (FLU A&B, COVID)
Influenza A by PCR: NEGATIVE
Influenza B by PCR: NEGATIVE
SARS Coronavirus 2 by RT PCR: NEGATIVE

## 2019-06-22 LAB — LACTIC ACID, PLASMA
Lactic Acid, Venous: 2.9 mmol/L (ref 0.5–1.9)
Lactic Acid, Venous: 1.2 mmol/L (ref 0.5–1.9)

## 2019-06-22 LAB — PROCALCITONIN: Procalcitonin: 0.33 ng/mL

## 2019-06-22 LAB — PROTIME-INR
INR: 1.2 (ref 0.8–1.2)
Prothrombin Time: 15.4 seconds — ABNORMAL HIGH (ref 11.4–15.2)

## 2019-06-22 LAB — APTT: aPTT: 24 seconds (ref 24–36)

## 2019-06-22 MED ORDER — ROSUVASTATIN CALCIUM 20 MG PO TABS
40.0000 mg | ORAL_TABLET | Freq: Every day | ORAL | Status: DC
  Administered 2019-06-23 – 2019-06-27 (×5): 40 mg via ORAL
  Filled 2019-06-22 (×5): qty 2

## 2019-06-22 MED ORDER — ALBUTEROL SULFATE (2.5 MG/3ML) 0.083% IN NEBU: 2.5000 mg | INHALATION_SOLUTION | RESPIRATORY_TRACT | Status: DC | PRN

## 2019-06-22 MED ORDER — AMITRIPTYLINE HCL 75 MG PO TABS
150.0000 mg | ORAL_TABLET | Freq: Every day | ORAL | Status: DC
  Filled 2019-06-22 (×2): qty 2

## 2019-06-22 MED ORDER — ALPRAZOLAM 0.5 MG PO TABS: 0.5000 mg | ORAL_TABLET | Freq: Three times a day (TID) | ORAL | Status: DC | PRN

## 2019-06-22 MED ORDER — CITALOPRAM HYDROBROMIDE 20 MG PO TABS
20.0000 mg | ORAL_TABLET | Freq: Every day | ORAL | Status: DC
  Administered 2019-06-23 – 2019-06-27 (×5): 20 mg via ORAL
  Filled 2019-06-22 (×6): qty 1

## 2019-06-22 MED ORDER — SODIUM CHLORIDE 0.9 % IV SOLN
2.0000 g | Freq: Three times a day (TID) | INTRAVENOUS | Status: DC
  Administered 2019-06-23 – 2019-06-26 (×10): 2 g via INTRAVENOUS
  Filled 2019-06-22 (×12): qty 2

## 2019-06-22 MED ORDER — OXYCODONE HCL 5 MG PO TABS
5.0000 mg | ORAL_TABLET | Freq: Four times a day (QID) | ORAL | Status: DC | PRN
  Administered 2019-06-23: 10 mg via ORAL
  Administered 2019-06-24 – 2019-06-25 (×5): 5 mg via ORAL
  Administered 2019-06-26 (×2): 10 mg via ORAL
  Administered 2019-06-26 – 2019-06-27 (×4): 5 mg via ORAL
  Filled 2019-06-22 (×5): qty 1
  Filled 2019-06-22 (×2): qty 2
  Filled 2019-06-22 (×3): qty 1
  Filled 2019-06-22: qty 2
  Filled 2019-06-22: qty 1

## 2019-06-22 MED ORDER — ASPIRIN EC 325 MG PO TBEC
325.0000 mg | DELAYED_RELEASE_TABLET | Freq: Every day | ORAL | Status: DC
  Administered 2019-06-23 – 2019-06-27 (×5): 325 mg via ORAL
  Filled 2019-06-22 (×4): qty 1

## 2019-06-22 MED ORDER — CARISOPRODOL 350 MG PO TABS
350.0000 mg | ORAL_TABLET | Freq: Four times a day (QID) | ORAL | Status: DC | PRN
  Administered 2019-06-25 – 2019-06-27 (×3): 350 mg via ORAL
  Filled 2019-06-22 (×3): qty 1

## 2019-06-22 MED ORDER — SODIUM CHLORIDE 0.9 % IV SOLN
2.0000 g | Freq: Once | INTRAVENOUS | Status: AC
  Administered 2019-06-22: 2 g via INTRAVENOUS
  Filled 2019-06-22: qty 2

## 2019-06-22 MED ORDER — IPRATROPIUM-ALBUTEROL 0.5-2.5 (3) MG/3ML IN SOLN
3.0000 mL | RESPIRATORY_TRACT | Status: DC
  Administered 2019-06-22 – 2019-06-23 (×5): 3 mL via RESPIRATORY_TRACT
  Filled 2019-06-22 (×6): qty 3

## 2019-06-22 MED ORDER — VANCOMYCIN HCL IN DEXTROSE 1-5 GM/200ML-% IV SOLN
1000.0000 mg | Freq: Once | INTRAVENOUS | Status: DC
  Filled 2019-06-22: qty 200

## 2019-06-22 MED ORDER — GABAPENTIN 300 MG PO CAPS
300.0000 mg | ORAL_CAPSULE | Freq: Every day | ORAL | Status: DC
  Administered 2019-06-23 – 2019-06-27 (×5): 300 mg via ORAL
  Filled 2019-06-22 (×5): qty 1

## 2019-06-22 MED ORDER — POTASSIUM CHLORIDE ER 10 MEQ PO TBCR: 10.0000 meq | EXTENDED_RELEASE_TABLET | Freq: Every day | ORAL | Status: DC

## 2019-06-22 MED ORDER — SODIUM CHLORIDE 0.9 % IV SOLN
1000.0000 mL | INTRAVENOUS | Status: DC
  Administered 2019-06-23: 1000 mL via INTRAVENOUS

## 2019-06-22 MED ORDER — SODIUM CHLORIDE 0.9 % IV SOLN
1000.0000 mL | INTRAVENOUS | Status: DC
  Administered 2019-06-22: 1000 mL via INTRAVENOUS

## 2019-06-22 MED ORDER — ENOXAPARIN SODIUM 40 MG/0.4ML ~~LOC~~ SOLN
40.0000 mg | SUBCUTANEOUS | Status: DC
  Administered 2019-06-22 – 2019-06-27 (×6): 40 mg via SUBCUTANEOUS
  Filled 2019-06-22 (×6): qty 0.4

## 2019-06-22 MED ORDER — POTASSIUM CHLORIDE CRYS ER 10 MEQ PO TBCR
10.0000 meq | EXTENDED_RELEASE_TABLET | Freq: Every day | ORAL | Status: DC
  Administered 2019-06-23 – 2019-06-27 (×5): 10 meq via ORAL
  Filled 2019-06-22 (×5): qty 1

## 2019-06-22 MED ORDER — VANCOMYCIN HCL 1500 MG/300ML IV SOLN
1500.0000 mg | Freq: Two times a day (BID) | INTRAVENOUS | Status: DC
  Administered 2019-06-22 – 2019-06-24 (×5): 1500 mg via INTRAVENOUS
  Filled 2019-06-22 (×6): qty 300

## 2019-06-22 MED ORDER — METHYLPREDNISOLONE SODIUM SUCC 40 MG IJ SOLR
40.0000 mg | Freq: Every day | INTRAMUSCULAR | Status: DC
  Administered 2019-06-22: 40 mg via INTRAVENOUS
  Filled 2019-06-22 (×2): qty 1

## 2019-06-22 MED ORDER — FUROSEMIDE 40 MG PO TABS: 40.0000 mg | ORAL_TABLET | Freq: Two times a day (BID) | ORAL | Status: DC

## 2019-06-22 NOTE — ED Notes (Signed)
Resp. Called to place pt on bi-pap per Dr. Sabra Heck

## 2019-06-22 NOTE — Progress Notes (Signed)
Placed patient on BiPAP per MD's ordedr

## 2019-06-22 NOTE — Progress Notes (Addendum)
Pharmacy Antibiotic Note  Howard Fox is a 59 y.o. male admitted on 05/24/2019 with pneumonia.Pharmacy has been consulted for Cefepime and Vancomycin dosing. Patient was recently discharged on Vancomycin also for MRSA bacteremia and planned for 6 weeks of IV vancomycin. Apt to follow up with ID feb 16th.   Height: 6' (182.9 cm) Weight: 227 lb (103 kg) IBW/kg (Calculated) : 77.6  No data recorded.  Recent Labs  Lab 06/06/2019 1737  WBC 29.4*  CREATININE 0.98  LATICACIDVEN 2.9*    Estimated Creatinine Clearance: 102 mL/min (by C-G formula based on SCr of 0.98 mg/dL).    Allergies  Allergen Reactions  . Codeine Itching  . Dilaudid [Hydromorphone Hcl] Itching  . Shellfish Allergy Diarrhea and Nausea And Vomiting    Antimicrobials this admission: 1/30 Cefepime >>  1/30 Vancomycin >>   Dose adjustments this admission:   Microbiology results: 1/30 BCx: Pending  Plan:  - Cefepime 2g IV q8h  - Vancomycin is being given as an outpatient, per patient was receiving Vancomycin 2000 mg IV q12h (recently changed) and received a dose earlier today. - With the patient's current renal function will start Vancomycin 1500mg  IV q12h  - Est Calc AUC 510 - Will monitor patients renal function and obtain levels when appropriate.   Thank you for allowing pharmacy to be a part of this patient's care.`  Duanne Limerick PharmD. BCPS 06/11/2019 6:45 PM

## 2019-06-22 NOTE — ED Notes (Signed)
Pt remains on non-re breather at this time and tolerating well.

## 2019-06-22 NOTE — ED Provider Notes (Signed)
[facility] EMERGENCY DEPARTMENT Provider Note   CSN: [id number] Arrival date & time: 05/27/2019  1710     History Chief Complaint  Patient presents with  . Respiratory Distress    Howard Fox is a 59 y.o. male.  HPI   This patient is a 59 year old male, he has been recently admitted to the hospital with what ended up being pneumonia.  The patient was discharged recently after a admission to the hospital for acute hypoxic respiratory failure which was likely related to both COPD, some pulmonary edema and likely pneumonia.  He was placed on 3 L by nasal cannula which she had not been on previously.  He states he stopped smoking 2 weeks ago and is not smoke since.  The patient was found unresponsive in the bathtub by the mother who was not able to get amount of the bathtub.  He was completely unresponsive, he was breathing rapidly, he was cyanotic.  Most of the information comes from the paramedics who had to significantly assist this patient out of the tub.  He required high flow nonrebreather and eventually started to wake up and assist.  He was very tachypneic, he was tachycardic and ill-appearing.  By the time he arrived to the hospital on a nonrebreather his oxygen came up in the 80% range.  The patient denies any swelling of the legs but does endorse that he has been gradually more short of breath over the last several days.  According to the nurse the patient takes OxyContin 2 tablets every 6 hours as well as Xanax 3 times a day as well.  Evidently the patient was found without his oxygen in the bathtub.  The patient does have bipolar disorder and is on prednisone endorses that this makes him feel bad  Past Medical History:  Diagnosis Date  . Anxiety 10/25/2018  . Arthritis   . Bipolar 1 disorder ([place])   . Depression   . Fracture, ribs 1993   ruptured stomach, left lung resulted from industrial fall  . Heart murmur    as a child  . Hepatitis C   .  History of kidney stones   . Hypertension   . Kidney stones   . Low blood pressure 02/20/2019  . MRSA bacteremia 10/09/2018  . S/P lumbar fusion 02/01/2019  . Vertebral osteomyelitis ([place]) 10/25/2018    Patient Active Problem List   Diagnosis Date Noted  . Community acquired pneumonia 06/03/2019  . Fever 06/03/2019  . Tachypnea 06/03/2019  . Dyspnea 06/03/2019  . Chronic pain syndrome 06/03/2019  . Pneumonia 06/03/2019  . Back pain 04/04/2019  . Aortic atherosclerosis ([place]) 04/04/2019  . H/O intravenous drug use in remission 02/21/2019  . Low blood pressure 02/20/2019  . Postoperative anemia due to acute blood loss 02/12/2019  . Vertebral osteomyelitis ([place]) 10/25/2018  . Anxiety 10/25/2018  . History of cocaine use 10/24/2018  . Marijuana smoker 10/24/2018  . MRSA bacteremia 10/09/2018  . Diskitis 10/08/2018  . History of prediabetes 06/26/2018  . Hepatitis C, chronic ([place]) 04/01/2015  . Tobacco use disorder 04/01/2015  . Other abnormal glucose 12/10/2014  . Obesity (BMI 30.0-34.9) 12/10/2014  . Medication management 12/03/2013  . Essential hypertension 04/25/2013  . Hyperlipidemia, mixed 04/25/2013  . Vitamin D deficiency 04/25/2013  . Bipolar disorder ([place]) 02/19/2007    Past Surgical History:  Procedure Laterality Date  . CHOLECYSTECTOMY    . LAMINECTOMY WITH POSTERIOR LATERAL ARTHRODESIS LEVEL 4 N/A 02/01/2019   Procedure: Laminectomy and  Foraminotomy - Thoracic ten-Thoracic eleven, Instrumented fusion Thoracic eight-Lumbar two;  Surgeon: Eustace Moore, MD;  Location: [facility];  Service: Neurosurgery;  Laterality: N/A;  . laser surgery for kidney stones Left   . LITHOTRIPSY         Family History  Problem Relation Age of Onset  . Mental illness Mother        Bipolar  . Stroke Father   . Arthritis Father     Social History   Tobacco Use  . Smoking status: Former Smoker    Packs/day: 0.50    Years: 42.00    Pack years: 21.00    Types: Cigarettes    Start  date: 01/02/1977    Quit date: 06/05/2019    Years since quitting: 0.0  . Smokeless tobacco: Never Used  Substance Use Topics  . Alcohol use: Yes    Comment: 2 beers a day  . Drug use: Yes    Frequency: 1.0 times per week    Types: Marijuana    Comment: last time 2 weeks ago    Home Medications Prior to Admission medications   Medication Sig Start Date End Date Taking? Authorizing Provider  albuterol (VENTOLIN HFA) 108 (90 Base) MCG/ACT inhaler Use 2 Inhalations 15 minutes apart every 4 hours to Rescue Asthma Patient taking differently: Inhale 2 puffs into the lungs every 4 (four) hours as needed for wheezing or shortness of breath.  02/16/19   Unk Pinto, MD  ALPRAZolam Duanne Moron) 0.5 MG tablet Take 1 tablet (0.5 mg total) by mouth 3 (three) times daily as needed for anxiety. 06/12/19   Georgette Shell, MD  amitriptyline (ELAVIL) 150 MG tablet TAKE 1 TABLET BY MOUTH AT BEDTIME FOR SLEEP AND MOOD Patient taking differently: Take 150 mg by mouth at bedtime.  03/15/19   Liane Comber, NP  aspirin EC 325 MG tablet Take 325 mg by mouth daily.     [provider]  carisoprodol (SOMA) 350 MG tablet Take 350 mg by mouth every 6 (six) hours as needed for muscle spasms.     [provider]  cholecalciferol (VITAMIN D3) 25 MCG (1000 UT) tablet Take 5,000 Units by mouth daily.    [provider]  citalopram (CELEXA) 40 MG tablet Take 1 tablet (40 mg total) by mouth daily with lunch. Patient taking differently: Take 20 mg by mouth daily with lunch.  04/08/19   Liane Comber, NP  furosemide (LASIX) 40 MG tablet Take 1 tablet (40 mg total) by mouth 2 (two) times daily. 06/12/19 06/11/20  Georgette Shell, MD  gabapentin (NEURONTIN) 300 MG capsule Take 1 capsule 3 x /day if needed for Pain Patient taking differently: Take 300 mg by mouth 3 (three) times daily.  05/05/19   Unk Pinto, MD  Ipratropium-Albuterol (COMBIVENT) 20-100 MCG/ACT AERS respimat Inhale 1  puff into the lungs every 6 (six) hours.    [provider]  Menthol-Camphor (ICY HOT ADVANCED RELIEF) 16-11 % CREA Apply 1 application topically 4 (four) times daily as needed (pain).     [provider]  oxyCODONE (OXY IR/ROXICODONE) 5 MG immediate release tablet Take 5-10 mg by mouth every 6 (six) hours as needed for moderate pain or severe pain.  05/28/19   [provider]  polyethylene glycol (MIRALAX / GLYCOLAX) 17 g packet Take 17 g by mouth daily as needed. Patient taking differently: Take 17 g by mouth daily as needed for mild constipation.  10/15/18   Shelly Coss, MD  potassium chloride (KLOR-CON) 10 MEQ tablet Take 1 tablet (10 mEq total) by mouth daily. 06/12/19   Georgette Shell, MD  predniSONE (DELTASONE) 10 MG tablet Take 4 tablets daily for first 4 days then 3 tablets daily for the next 4 days then 2 tablets daily for the following 4 days and then 10 mg daily till all the medications are done. 06/12/19   Georgette Shell, MD  rosuvastatin (CRESTOR) 40 MG tablet TAKE 1/2 TO 1 (ONE-HALF TO ONE) TABLET ONCE DAILY OR  AS DIRECTED FOR CHOLESTEROL Patient taking differently: Take 40 mg by mouth daily.  05/30/19   Unk Pinto, MD  sodium chloride (OCEAN) 0.65 % SOLN nasal spray Place 1 spray into both nostrils as needed for congestion. 06/12/19   Georgette Shell, MD  vancomycin IVPB Inject 750 mg into the vein every 8 (eight) hours. Indication: MRSA bactermia Last Day of Therapy: 07/17/2019 Labs - Sunday/Monday:  CBC/D, CMP, and vancomycin trough. Labs - Thursday:  BMP and vancomycin trough Labs - Every other week:  ESR and CRP 06/12/19 07/18/19  Georgette Shell, MD    Allergies    Codeine, Dilaudid [hydromorphone hcl], and Shellfish allergy  Review of Systems   Review of Systems  All other systems reviewed and are negative.   Physical Exam Updated Vital Signs BP 109/62   Pulse (!) 103   Resp (!) 26   Ht 1.829 m (6')   Wt 103 kg    SpO2 96%   BMI 30.79 kg/m   Physical Exam Vitals and nursing note reviewed.  Constitutional:      General: He is in acute distress.     Appearance: He is well-developed. He is ill-appearing and toxic-appearing.  HENT:     Head: Normocephalic and atraumatic.     Mouth/Throat:     Pharynx: No oropharyngeal exudate.  Eyes:     General: No scleral icterus.       Right eye: No discharge.        Left eye: No discharge.     Conjunctiva/sclera: Conjunctivae normal.     Pupils: Pupils are equal, round, and reactive to light.  Neck:     Thyroid: No thyromegaly.     Vascular: No JVD.  Cardiovascular:     Rate and Rhythm: Regular rhythm. Tachycardia present.     Heart sounds: Normal heart sounds. No murmur. No friction rub. No gallop.      Comments: Heart rate of 120 bpm, no peripheral edema, pulses palpable at the radial arteries, no JVD Pulmonary:     Effort: Respiratory distress present.     Breath sounds: Wheezing, rhonchi and rales present.     Comments: The patient is in acute respiratory distress speaking in 2-3 word sentences with prolonged expiratory phase, increased work of breathing, accessory muscle use, diffuse rales Abdominal:     General: Bowel sounds are normal. There is no distension.     Palpations: Abdomen is soft. There is no mass.     Tenderness: There is no abdominal tenderness.     Comments: Distended but soft nontender abdomen, midline scar well-healed  Musculoskeletal:        General: No tenderness, deformity or signs of injury. Normal range of motion.     Cervical back: Normal range of motion and neck supple.     Right lower leg: No edema.     Left lower leg: No edema.  Lymphadenopathy:     Cervical: No cervical adenopathy.  Skin:  General: Skin is warm and dry.     Coloration: Skin is pale.     Findings: No erythema or rash.     Comments: Pale mottled skin  Neurological:     Mental Status: He is alert.     Coordination: Coordination normal.      Comments: The patient is now awake and alert and able to move himself from the EMS stretcher to the gurney however the patient is generally weak.  Psychiatric:        Behavior: Behavior normal.     ED Results / Procedures / Treatments   Labs (all labs ordered are listed, but only abnormal results are displayed) Labs Reviewed  LACTIC ACID, PLASMA - Abnormal; Notable for the following components:      Result Value   Lactic Acid, Venous 2.9 (*)    All other components within normal limits  COMPREHENSIVE METABOLIC PANEL - Abnormal; Notable for the following components:   Glucose, Bld 147 (*)    Albumin 2.3 (*)    ALT 61 (*)    All other components within normal limits  CBC WITH DIFFERENTIAL/PLATELET - Abnormal; Notable for the following components:   WBC 29.4 (*)    Platelets 404 (*)    Neutro Abs 28.2 (*)    Lymphs Abs 0.6 (*)    All other components within normal limits  PROTIME-INR - Abnormal; Notable for the following components:   Prothrombin Time 15.4 (*)    All other components within normal limits  BRAIN NATRIURETIC PEPTIDE - Abnormal; Notable for the following components:   B Natriuretic Peptide 217.3 (*)    All other components within normal limits  POCT I-STAT 7, (LYTES, BLD GAS, ICA,H+H) - Abnormal; Notable for the following components:   pO2, Arterial 69.0 (*)    All other components within normal limits  RESPIRATORY PANEL BY RT PCR (FLU A&B, COVID)  CULTURE, BLOOD (ROUTINE X 2)  CULTURE, BLOOD (ROUTINE X 2)  URINE CULTURE  APTT  PROCALCITONIN  LACTIC ACID, PLASMA  URINALYSIS, ROUTINE W REFLEX MICROSCOPIC  BLOOD GAS, ARTERIAL    EKG EKG Interpretation  Date/Time:  Saturday June 22 2019 17:18:07 EST Ventricular Rate:  121 PR Interval:    QRS Duration: 107 QT Interval:  320 QTC Calculation: 454 R Axis:   110 Text Interpretation: Sinus tachycardia Probable left atrial enlargement Right axis deviation Abnormal R-wave progression, late transition  Borderline repolarization abnormality since last tracing no significant change Confirmed by Noemi Chapel [phone]) on 06/19/2019 5:32:14 PM   Radiology DG Chest Port 1 View  Result Date: 05/24/2019 CLINICAL DATA:  Cough EXAM: PORTABLE CHEST 1 VIEW COMPARISON:  06/12/2019, 06/06/2019 FINDINGS: Postsurgical changes related to left lower lobectomy and resection of multiple left-sided ribs with extensive surgical wires, many of which are chronically disrupted. Partially visualized thoracolumbar fusion hardware. Grossly stable cardiomediastinal contours. Diffuse interstitial opacities throughout both lungs, slightly progressed from prior. No pneumothorax is seen. IMPRESSION: Diffuse interstitial opacities throughout both lungs, slightly progressed from prior. Findings may reflect pulmonary edema versus atypical or viral infection. Electronically Signed   By: Davina Poke D.O.   On: 05/30/2019 17:55    Procedures .Critical Care Performed by: Noemi Chapel, MD Authorized by: Noemi Chapel, MD   Critical care provider statement:    Critical care time (minutes):  35   Critical care time was exclusive of:  Separately billable procedures and treating other patients and teaching time   Critical care was necessary to treat or prevent imminent  or life-threatening deterioration of the following conditions:  Respiratory failure   Critical care was time spent personally by me on the following activities:  Blood draw for specimens, development of treatment plan with patient or surrogate, discussions with consultants, evaluation of patient's response to treatment, examination of patient, obtaining history from patient or surrogate, ordering and performing treatments and interventions, ordering and review of laboratory studies, ordering and review of radiographic studies, pulse oximetry, re-evaluation of patient's condition and review of old charts   (including critical care time)  Medications Ordered in  ED Medications  0.9 %  sodium chloride infusion (1,000 mLs Intravenous New Bag/Given 06/14/2019 1755)  vancomycin (VANCOREADY) IVPB 1500 mg/300 mL (has no administration in time range)  ceFEPIme (MAXIPIME) 2 g in sodium chloride 0.9 % 100 mL IVPB (has no administration in time range)  ceFEPIme (MAXIPIME) 2 g in sodium chloride 0.9 % 100 mL IVPB (0 g Intravenous Stopped 05/24/2019 1854)    ED Course  I have reviewed the triage vital signs and the nursing notes.  Pertinent labs & imaging results that were available during my care of the patient were reviewed by me and considered in my medical decision making (see chart for details).    MDM Rules/Calculators/A&P                      The patient is acutely ill with respiratory failure, it is unclear whether this is related to aspiration after being obtunded in the bathtub off his oxygen, this could be hypoxic, acidotic, encephalopathic, would also consider infectious, Covid given that he was recently in the hospital.  He is requiring high flow nonrebreather just to get his oxygen above 90%, his work of breathing is decreased with this however he is acutely ill.  Chest x-ray, Covid, may need BiPAP and mostly will need admission to the hospital.  EKG reveals that the patient has a sinus tachycardia without any significant ST elevation or depression to suggest acute ischemia.  7:26 PM Cardiac monitoring reveals sinus tachycardia rate of 120 (Rate & rhythm), as reviewed and interpreted by me. Cardiac monitoring was ordered due to respiratory distress and pneumonia and to monitor patient for dysrhythmia.  The patient will be treated for potential severe sepsis due to pulmonary failure, this is endorgan dysfunction with a severe leukocytosis, tachycardia, hypoxia and a lactic acid of 2.9. He has been given antibiotics, has been treated on BiPAP for his respiratory distress and at this time the patient will need to be admitted to the hospital,  Discussed with  the hospitalist will admit  Howard Fox was evaluated in Emergency Department on 06/04/2019 for the symptoms described in the history of present illness. He was evaluated in the context of the global COVID-19 pandemic, which necessitated consideration that the patient might be at risk for infection with the SARS-CoV-2 virus that causes COVID-19. Institutional protocols and algorithms that pertain to the evaluation of patients at risk for COVID-19 are in a state of rapid change based on information released by regulatory bodies including the CDC and federal and state organizations. These policies and algorithms were followed during the patient's care in the ED.   Final Clinical Impression(s) / ED Diagnoses Final diagnoses:  Severe sepsis ([place])  Acute respiratory failure with hypoxia ([facility])      Noemi Chapel, MD 06/23/2019 [phone]

## 2019-06-22 NOTE — ED Notes (Signed)
Pt's mother Bonnita Nasuti) called, updated her on pt's condition and plan of care.

## 2019-06-22 NOTE — H&P (Signed)
History and Physical    Howard Fox:[id number] DOB: Jul 01, 1960 DOA: 05/27/2019  PCP: Unk Pinto, MD  Patient coming from: Home, lives with mother  I have personally briefly reviewed patient's old medical records in [place]  Chief Complaint: acute respiratory distress  HPI: Howard Fox is a 59 y.o. 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   Patient says he woke up from a nap and decided to take a bath today after not having one for 5 days.Reportedly his mother later found him in the bath cyanotic and tachypneic. EMS found patient with an oxygen saturation of 52% and had to lift him from the bathtub due to unresponsiveness and he became more alert after he was placed on NRB.  On arrival to ED, O2 on NRB at 80% and he was ill-appearing, tachycardic and tachypneic requiring Bipap.   He was just discharged on 06/12/2019 after a 10-day admission where he had acute hypoxic respiratory failure from a combination of COPD exacerbation/pulmonary edema/pneumonia. He required initially NRB but was able to taper down to 3L and was discharge home on this. Hospital course also complicated by MRSA bacteremia but no TEE was obtained because of his hypoxia and he was later anxious to go home. He was discharged on IV vancomycin for 6 weeks and had PICC line placement on 06/07/2019.   On my evaluation, he appears alert and much improved on Bipap. He was normotensive with mild tachycardic.  Pt reports to me that he has been wearing his O2 24/7 after discharge and even in the bathroom although he told ED physician he did not. He says that today he actually felt the best he has since recent discharge from the hospital. He reports  calling pharmacy to see if taking extra oxycodone would help his breathing and was taking two 30m tablets at a time. Also says that taking prednisone is affecting his bipolar.   Reports he quit smoking 2 weeks ago after 40 year tobacco user history. Has 2 beers daily and had them before getting into bathtub. No illicit drug use.  Lab work was significant for WBC of 29.4K, platelet of 404. Glucose of 147.  Lactate of 2.9. BNP of 217.  PCT of 0.33. CXR showed diffuse interstitial opacities throughout both lungs that is progressed from prior. Findings reflect pulm edema vs atypical or viral infection. Negative influenza and COVID test.   He was started on 125cc IV NS fluid and broad-spectrum antibiotic with vancomycin and cefepime    Review of Systems:  Constitutional: No Weight Change, No Fever ENT/Mouth: No sore throat, No Rhinorrhea Eyes: No Eye Pain, No Vision Changes Cardiovascular: No Chest Pain, no SOB Respiratory: + Cough, +Sputum Gastrointestinal: No Nausea, No Vomiting, Genitourinary: no Urinary Incontinence Musculoskeletal: No Arthralgias, + Myalgias-chronic back pain Skin: No Skin Lesions, No Pruritus Neuro: no Weakness, No Numbness,  No Loss of Consciousness, No Syncope Psych: No Anxiety/Panic, No Depression, no decrease appetite Heme/Lymph: No Bruising, No Bleeding  Past Medical History:  Diagnosis Date  . Anxiety 10/25/2018  . Arthritis   . Bipolar 1 disorder ([place]   . Depression   . Fracture, ribs 1993   ruptured stomach, left lung resulted from industrial fall  . Heart murmur    as a child  .  Hepatitis C   . History of kidney stones   . Hypertension   . Kidney stones   . Low blood pressure 02/20/2019  . MRSA bacteremia 10/09/2018  . S/P lumbar fusion 02/01/2019  . Vertebral osteomyelitis ([place]) 10/25/2018    Past Surgical History:  Procedure Laterality Date  . CHOLECYSTECTOMY    . LAMINECTOMY WITH POSTERIOR LATERAL ARTHRODESIS LEVEL 4 N/A 02/01/2019    Procedure: Laminectomy and Foraminotomy - Thoracic ten-Thoracic eleven, Instrumented fusion Thoracic eight-Lumbar two;  Surgeon: Eustace Moore, MD;  Location: [facility];  Service: Neurosurgery;  Laterality: N/A;  . laser surgery for kidney stones Left   . LITHOTRIPSY       reports that he quit smoking about 2 weeks ago. His smoking use included cigarettes. He started smoking about 42 years ago. He has a 21.00 pack-year smoking history. He has never used smokeless tobacco. He reports current alcohol use. He reports current drug use. Frequency: 1.00 time per week. Drug: Marijuana.  Allergies  Allergen Reactions  . Codeine Itching  . Dilaudid [Hydromorphone Hcl] Itching  . Shellfish Allergy Diarrhea and Nausea And Vomiting    Family History  Problem Relation Age of Onset  . Mental illness Mother        Bipolar  . Stroke Father   . Arthritis Father      Prior to Admission medications   Medication Sig Start Date End Date Taking? Authorizing Provider  albuterol (VENTOLIN HFA) 108 (90 Base) MCG/ACT inhaler Use 2 Inhalations 15 minutes apart every 4 hours to Rescue Asthma Patient taking differently: Inhale 2 puffs into the lungs every 4 (four) hours as needed for wheezing or shortness of breath.  02/16/19   Unk Pinto, MD  ALPRAZolam Duanne Moron) 0.5 MG tablet Take 1 tablet (0.5 mg total) by mouth 3 (three) times daily as needed for anxiety. 06/12/19   Georgette Shell, MD  amitriptyline (ELAVIL) 150 MG tablet TAKE 1 TABLET BY MOUTH AT BEDTIME FOR SLEEP AND MOOD Patient taking differently: Take 150 mg by mouth at bedtime.  03/15/19   Liane Comber, NP  aspirin EC 325 MG tablet Take 325 mg by mouth daily.     [provider]  carisoprodol (SOMA) 350 MG tablet Take 350 mg by mouth every 6 (six) hours as needed for muscle spasms.     [provider]  cholecalciferol (VITAMIN D3) 25 MCG (1000 UT) tablet Take 5,000 Units by mouth daily.    [provider]  citalopram  (CELEXA) 40 MG tablet Take 1 tablet (40 mg total) by mouth daily with lunch. Patient taking differently: Take 20 mg by mouth daily with lunch.  04/08/19   Liane Comber, NP  furosemide (LASIX) 40 MG tablet Take 1 tablet (40 mg total) by mouth 2 (two) times daily. 06/12/19 06/11/20  Georgette Shell, MD  gabapentin (NEURONTIN) 300 MG capsule Take 1 capsule 3 x /day if needed for Pain Patient taking differently: Take 300 mg by mouth 3 (three) times daily.  05/05/19   Unk Pinto, MD  Ipratropium-Albuterol (COMBIVENT) 20-100 MCG/ACT AERS respimat Inhale 1 puff into the lungs every 6 (six) hours.    [provider]  Menthol-Camphor (ICY HOT ADVANCED RELIEF) 16-11 % CREA Apply 1 application topically 4 (four) times daily as needed (pain).     [provider]  oxyCODONE (OXY IR/ROXICODONE) 5 MG immediate release tablet Take 5-10 mg by mouth every 6 (six) hours as needed for moderate pain or severe pain.  05/28/19  [provider]  polyethylene glycol (MIRALAX / GLYCOLAX) 17 g packet Take 17 g by mouth daily as needed. Patient taking differently: Take 17 g by mouth daily as needed for mild constipation.  10/15/18   Shelly Coss, MD  potassium chloride (KLOR-CON) 10 MEQ tablet Take 1 tablet (10 mEq total) by mouth daily. 06/12/19   Georgette Shell, MD  predniSONE (DELTASONE) 10 MG tablet Take 4 tablets daily for first 4 days then 3 tablets daily for the next 4 days then 2 tablets daily for the following 4 days and then 10 mg daily till all the medications are done. 06/12/19   Georgette Shell, MD  rosuvastatin (CRESTOR) 40 MG tablet TAKE 1/2 TO 1 (ONE-HALF TO ONE) TABLET ONCE DAILY OR  AS DIRECTED FOR CHOLESTEROL Patient taking differently: Take 40 mg by mouth daily.  05/30/19   Unk Pinto, MD  sodium chloride (OCEAN) 0.65 % SOLN nasal spray Place 1 spray into both nostrils as needed for congestion. 06/12/19   Georgette Shell, MD  vancomycin IVPB Inject 750  mg into the vein every 8 (eight) hours. Indication: MRSA bactermia Last Day of Therapy: 07/17/2019 Labs - Sunday/Monday:  CBC/D, CMP, and vancomycin trough. Labs - Thursday:  BMP and vancomycin trough Labs - Every other week:  ESR and CRP 06/12/19 07/18/19  Georgette Shell, MD    Physical Exam: Vitals:   06/04/2019 1845 06/13/2019 1859 06/19/2019 1900 05/27/2019 1915  BP: 103/69  101/69 109/62  Pulse: (!) 101  (!) 101 (!) 103  Resp:   (!) 25 (!) 26  SpO2: 98% 97% 97% 96%  Weight:      Height:        Constitutional: NAD, calm, comfortable, nontoxic appearing obese male lying flat in bed on BiPAP. Asking for a soda.  Vitals:   05/29/2019 1845 06/04/2019 1859 05/30/2019 1900 06/02/2019 1915  BP: 103/69  101/69 109/62  Pulse: (!) 101  (!) 101 (!) 103  Resp:   (!) 25 (!) 26  SpO2: 98% 97% 97% 96%  Weight:      Height:       Eyes: PERRL, lids and conjunctivae normal ENMT: Mucous membranes are moist. Missing dentition.  Neck: normal, supple Respiratory: Bibasilar crackles and faint wheeze on anterior exam although difficult to auscultate with patient being on BiPAP.  Normal respiratory effort, patient able to speak in full sentences. No accessory muscle use.  Cardiovascular: Tachycardia, no murmurs / rubs / gallops. No extremity edema.Abdomen: no tenderness, no masses palpated.Bowel sounds positive.  Musculoskeletal: no clubbing / cyanosis. No joint deformity upper and lower extremities. Good ROM, no contractures. Normal muscle tone.  Skin: Healing abrasions on right posterior hand.  Faint bruising on right lower abdomen. Neurologic: CN 2-12 grossly intact. Sensation intact. Strength 5/5 in all 4.  Psychiatric: Normal judgment and insight. Alert and oriented x 3. Normal mood.     Labs on Admission: I have personally reviewed following labs and imaging studies  CBC: Recent Labs  Lab 05/25/2019 1730 06/21/2019 1737  WBC  --  29.4*  NEUTROABS  --  28.2*  HGB 13.9 13.7  HCT 41.0 42.3  MCV  --   88.3  PLT  --  161*   Basic Metabolic Panel: Recent Labs  Lab 06/15/2019 1730 06/23/2019 1737  NA 136 138  K 3.8 3.9  CL  --  101  CO2  --  25  GLUCOSE  --  147*  BUN  --  18  CREATININE  --  0.98  CALCIUM  --  8.9   GFR: Estimated Creatinine Clearance: 102 mL/min (by C-G formula based on SCr of 0.98 mg/dL). Liver Function Tests: Recent Labs  Lab 06/12/2019 1737  AST 38  ALT 61*  ALKPHOS 126  BILITOT 0.3  PROT 6.8  ALBUMIN 2.3*   No results for input(s): LIPASE, AMYLASE in the last 168 hours. No results for input(s): AMMONIA in the last 168 hours. Coagulation Profile: Recent Labs  Lab 06/20/2019 1737  INR 1.2   Cardiac Enzymes: No results for input(s): CKTOTAL, CKMB, CKMBINDEX, TROPONINI in the last 168 hours. BNP (last 3 results) No results for input(s): PROBNP in the last 8760 hours. HbA1C: No results for input(s): HGBA1C in the last 72 hours. CBG: No results for input(s): GLUCAP in the last 168 hours. Lipid Profile: No results for input(s): CHOL, HDL, LDLCALC, TRIG, CHOLHDL, LDLDIRECT in the last 72 hours. Thyroid Function Tests: No results for input(s): TSH, T4TOTAL, FREET4, T3FREE, THYROIDAB in the last 72 hours. Anemia Panel: No results for input(s): VITAMINB12, FOLATE, FERRITIN, TIBC, IRON, RETICCTPCT in the last 72 hours. Urine analysis:    Component Value Date/Time   COLORURINE YELLOW 06/03/2019 0035   APPEARANCEUR CLEAR 06/03/2019 0035   LABSPEC 1.012 06/03/2019 0035   PHURINE 6.0 06/03/2019 0035   GLUCOSEU NEGATIVE 06/03/2019 0035   HGBUR MODERATE (A) 06/03/2019 0035   BILIRUBINUR NEGATIVE 06/03/2019 0035   KETONESUR NEGATIVE 06/03/2019 0035   PROTEINUR NEGATIVE 06/03/2019 0035   UROBILINOGEN 0.2 08/26/2012 1453   NITRITE NEGATIVE 06/03/2019 0035   LEUKOCYTESUR NEGATIVE 06/03/2019 0035    Radiological Exams on Admission: DG Chest Port 1 View  Result Date: 06/02/2019 CLINICAL DATA:  Cough EXAM: PORTABLE CHEST 1 VIEW COMPARISON:  06/12/2019,  06/06/2019 FINDINGS: Postsurgical changes related to left lower lobectomy and resection of multiple left-sided ribs with extensive surgical wires, many of which are chronically disrupted. Partially visualized thoracolumbar fusion hardware. Grossly stable cardiomediastinal contours. Diffuse interstitial opacities throughout both lungs, slightly progressed from prior. No pneumothorax is seen. IMPRESSION: Diffuse interstitial opacities throughout both lungs, slightly progressed from prior. Findings may reflect pulmonary edema versus atypical or viral infection. Electronically Signed   By: Davina Poke D.O.   On: 05/30/2019 17:55    EKG: Independently reviewed.   Assessment/Plan  Acute on chronic hypoxic respiratory failure  CXR shows worsening interstitial opacities from recent admission.  Multifactorial- HCAP and COPD exacerbation He was discharged on 3L on 06/12/2019 He was on steroid taper- will switch to IV solu-medrol daily PCT 0.33- continue IV vancomycin and IV cefepime Duonebs q4hr   Thrombocytosis  reactive  Recent MRSA bacteremia  Continue IV vancomycin dose per pharmacy Start date: 06/12/19 Last Day of Therapy: 07/17/2019  Repeat blood cultures pending   Hypertension Stable Continue Lasix and K supplement  Chronic pain  continue oxycodone, gabapentin, muscle relaxor  Hx of hepatitis C s/p treatment and reportedly tested negative   Hx of IVDU reports being clean for 3 years   HLD continue statin   Bipolar disorder stable mood  Continue Celexa, amitriptyline Continue PRN Xanax  Obesity BMP >70 Complications comorbities  DVT prophylaxis:.Lovenox Code Status: Full Family Communication: Plan discussed with patient at bedside  disposition Plan: Home with at least 2 midnight stays  Consults called:  Admission status: inpatient due to acute respiratory distress requiring Bipap and admission to progressive unit    Lunden Mcleish T [place] Sarinana DO [facility]   If  7PM-7AM, please contact night-coverage [email or web address]  06/05/2019, 7:29 PM

## 2019-06-22 NOTE — ED Notes (Signed)
Pt placed on Bipap

## 2019-06-22 NOTE — ED Triage Notes (Signed)
Pt to ED via [facility] with c/o resp distress.  Pt was in the bathtub and shortness of breath became worse.  Unable to get out of bathtub.  On EMS arrival pt's 02 sats were 52%.  Pt on non-re breather on arrival to ED with 02 sats 93%

## 2019-06-22 NOTE — ED Notes (Signed)
Resp made aware of Duoneb

## 2019-06-23 ENCOUNTER — Inpatient Hospital Stay (HOSPITAL_COMMUNITY): Payer: Medicare Other

## 2019-06-23 DIAGNOSIS — J9601 Acute respiratory failure with hypoxia: Secondary | ICD-10-CM

## 2019-06-23 DIAGNOSIS — J189 Pneumonia, unspecified organism: Secondary | ICD-10-CM

## 2019-06-23 DIAGNOSIS — I361 Nonrheumatic tricuspid (valve) insufficiency: Secondary | ICD-10-CM

## 2019-06-23 DIAGNOSIS — J810 Acute pulmonary edema: Secondary | ICD-10-CM

## 2019-06-23 DIAGNOSIS — G894 Chronic pain syndrome: Secondary | ICD-10-CM

## 2019-06-23 DIAGNOSIS — I10 Essential (primary) hypertension: Secondary | ICD-10-CM

## 2019-06-23 DIAGNOSIS — J441 Chronic obstructive pulmonary disease with (acute) exacerbation: Secondary | ICD-10-CM

## 2019-06-23 DIAGNOSIS — Z7189 Other specified counseling: Secondary | ICD-10-CM

## 2019-06-23 DIAGNOSIS — J80 Acute respiratory distress syndrome: Secondary | ICD-10-CM

## 2019-06-23 DIAGNOSIS — G9340 Encephalopathy, unspecified: Secondary | ICD-10-CM

## 2019-06-23 DIAGNOSIS — Z515 Encounter for palliative care: Secondary | ICD-10-CM

## 2019-06-23 LAB — CBC WITH DIFFERENTIAL/PLATELET
Abs Immature Granulocytes: 0.11 10*3/uL — ABNORMAL HIGH (ref 0.00–0.07)
Basophils Absolute: 0 10*3/uL (ref 0.0–0.1)
Basophils Relative: 0 %
Eosinophils Absolute: 0 10*3/uL (ref 0.0–0.5)
Eosinophils Relative: 0 %
HCT: 40.4 % (ref 39.0–52.0)
Hemoglobin: 13.1 g/dL (ref 13.0–17.0)
Immature Granulocytes: 1 %
Lymphocytes Relative: 4 %
Lymphs Abs: 0.8 10*3/uL (ref 0.7–4.0)
MCH: 28.4 pg (ref 26.0–34.0)
MCHC: 32.4 g/dL (ref 30.0–36.0)
MCV: 87.4 fL (ref 80.0–100.0)
Monocytes Absolute: 0.7 10*3/uL (ref 0.1–1.0)
Monocytes Relative: 4 %
Neutro Abs: 18.2 10*3/uL — ABNORMAL HIGH (ref 1.7–7.7)
Neutrophils Relative %: 91 %
Platelets: 374 10*3/uL (ref 150–400)
RBC: 4.62 MIL/uL (ref 4.22–5.81)
RDW: 14.9 % (ref 11.5–15.5)
WBC: 19.8 10*3/uL — ABNORMAL HIGH (ref 4.0–10.5)
nRBC: 0 % (ref 0.0–0.2)

## 2019-06-23 LAB — LACTATE DEHYDROGENASE: LDH: 322 U/L — ABNORMAL HIGH (ref 98–192)

## 2019-06-23 LAB — BLOOD GAS, ARTERIAL
Acid-Base Excess: 2.2 mmol/L — ABNORMAL HIGH (ref 0.0–2.0)
Acid-Base Excess: 2.4 mmol/L — ABNORMAL HIGH (ref 0.0–2.0)
Bicarbonate: 26.9 mmol/L (ref 20.0–28.0)
Bicarbonate: 25.8 mmol/L (ref 20.0–28.0)
FIO2: 60
FIO2: 80
O2 Saturation: 91.6 %
O2 Saturation: 80.9 %
Patient temperature: 36.6
Patient temperature: 36.9
pCO2 arterial: 46.2 mmHg (ref 32.0–48.0)
pCO2 arterial: 35.6 mmHg (ref 32.0–48.0)
pH, Arterial: 7.381 (ref 7.350–7.450)
pH, Arterial: 7.473 — ABNORMAL HIGH (ref 7.350–7.450)
pO2, Arterial: 69.9 mmHg — ABNORMAL LOW (ref 83.0–108.0)
pO2, Arterial: 44.5 mmHg — ABNORMAL LOW (ref 83.0–108.0)

## 2019-06-23 LAB — COMPREHENSIVE METABOLIC PANEL
ALT: 56 U/L — ABNORMAL HIGH (ref 0–44)
AST: 36 U/L (ref 15–41)
Albumin: 2.3 g/dL — ABNORMAL LOW (ref 3.5–5.0)
Alkaline Phosphatase: 122 U/L (ref 38–126)
Anion gap: 13 (ref 5–15)
BUN: 23 mg/dL — ABNORMAL HIGH (ref 6–20)
CO2: 25 mmol/L (ref 22–32)
Calcium: 9.1 mg/dL (ref 8.9–10.3)
Chloride: 103 mmol/L (ref 98–111)
Creatinine, Ser: 0.78 mg/dL (ref 0.61–1.24)
GFR calc Af Amer: >60 mL/min (ref >60)
GFR calc non Af Amer: >60 mL/min (ref >60)
Glucose, Bld: 114 mg/dL — ABNORMAL HIGH (ref 70–99)
Potassium: 4.1 mmol/L (ref 3.5–5.1)
Sodium: 141 mmol/L (ref 135–145)
Total Bilirubin: 0.7 mg/dL (ref 0.3–1.2)
Total Protein: 6.9 g/dL (ref 6.5–8.1)

## 2019-06-23 LAB — SARS CORONAVIRUS 2 (TAT 6-24 HRS): SARS Coronavirus 2: NEGATIVE

## 2019-06-23 LAB — FERRITIN: Ferritin: 136 ng/mL (ref 24–336)

## 2019-06-23 LAB — MAGNESIUM: Magnesium: 1.9 mg/dL (ref 1.7–2.4)

## 2019-06-23 LAB — ECHOCARDIOGRAM COMPLETE
Height: 72 in
Weight: 3632 oz

## 2019-06-23 LAB — PROCALCITONIN: Procalcitonin: 1.58 ng/mL

## 2019-06-23 LAB — MRSA PCR SCREENING: MRSA by PCR: NEGATIVE

## 2019-06-23 LAB — C-REACTIVE PROTEIN: CRP: 16.9 mg/dL — ABNORMAL HIGH (ref <1.0)

## 2019-06-23 MED ORDER — BUDESONIDE 0.5 MG/2ML IN SUSP
0.5000 mg | Freq: Two times a day (BID) | RESPIRATORY_TRACT | Status: DC
  Administered 2019-06-23 – 2019-06-28 (×11): 0.5 mg via RESPIRATORY_TRACT
  Filled 2019-06-23 (×12): qty 2

## 2019-06-23 MED ORDER — THIAMINE HCL 100 MG PO TABS
100.0000 mg | ORAL_TABLET | Freq: Every day | ORAL | Status: DC
  Administered 2019-06-24 – 2019-06-27 (×2): 100 mg via ORAL
  Filled 2019-06-23 (×4): qty 1

## 2019-06-23 MED ORDER — LORAZEPAM 2 MG/ML IJ SOLN
1.0000 mg | INTRAMUSCULAR | Status: DC | PRN
  Administered 2019-06-23: 1 mg via INTRAVENOUS
  Filled 2019-06-23: qty 1

## 2019-06-23 MED ORDER — FOLIC ACID 1 MG PO TABS
1.0000 mg | ORAL_TABLET | Freq: Every day | ORAL | Status: DC
  Administered 2019-06-24 – 2019-06-27 (×4): 1 mg via ORAL
  Filled 2019-06-23 (×4): qty 1

## 2019-06-23 MED ORDER — THIAMINE HCL 100 MG/ML IJ SOLN
100.0000 mg | Freq: Every day | INTRAMUSCULAR | Status: DC
  Administered 2019-06-23 – 2019-06-26 (×3): 100 mg via INTRAVENOUS
  Filled 2019-06-23 (×3): qty 2

## 2019-06-23 MED ORDER — AMITRIPTYLINE HCL 50 MG PO TABS
150.0000 mg | ORAL_TABLET | Freq: Every day | ORAL | Status: DC
  Administered 2019-06-23 – 2019-06-27 (×5): 150 mg via ORAL
  Filled 2019-06-23 (×6): qty 3

## 2019-06-23 MED ORDER — METHYLPREDNISOLONE SODIUM SUCC 125 MG IJ SOLR
60.0000 mg | Freq: Three times a day (TID) | INTRAMUSCULAR | Status: DC
  Administered 2019-06-23 – 2019-06-24 (×4): 60 mg via INTRAVENOUS
  Filled 2019-06-23 (×4): qty 2

## 2019-06-23 MED ORDER — PERFLUTREN LIPID MICROSPHERE
1.0000 mL | INTRAVENOUS | Status: AC | PRN
  Administered 2019-06-23: 2 mL via INTRAVENOUS
  Filled 2019-06-23: qty 10

## 2019-06-23 MED ORDER — LORAZEPAM 2 MG/ML IJ SOLN
0.5000 mg | INTRAMUSCULAR | Status: DC | PRN
  Administered 2019-06-23 – 2019-06-24 (×4): 0.5 mg via INTRAVENOUS
  Filled 2019-06-23 (×4): qty 1

## 2019-06-23 MED ORDER — FUROSEMIDE 10 MG/ML IJ SOLN
80.0000 mg | Freq: Two times a day (BID) | INTRAMUSCULAR | Status: DC
  Administered 2019-06-23 – 2019-06-24 (×4): 80 mg via INTRAVENOUS
  Filled 2019-06-23 (×5): qty 8

## 2019-06-23 MED ORDER — PANTOPRAZOLE SODIUM 40 MG IV SOLR
40.0000 mg | Freq: Two times a day (BID) | INTRAVENOUS | Status: DC
  Administered 2019-06-23 (×2): 40 mg via INTRAVENOUS
  Filled 2019-06-23 (×2): qty 40

## 2019-06-23 MED ORDER — DEXMEDETOMIDINE HCL IN NACL 400 MCG/100ML IV SOLN
0.4000 ug/kg/h | INTRAVENOUS | Status: DC
  Administered 2019-06-25: 0.8 ug/kg/h via INTRAVENOUS
  Administered 2019-06-25: 20:00:00 0.5 ug/kg/h via INTRAVENOUS
  Administered 2019-06-25: 09:00:00 0.4 ug/kg/h via INTRAVENOUS
  Administered 2019-06-26: 05:00:00 0.5 ug/kg/h via INTRAVENOUS
  Filled 2019-06-23 (×4): qty 100

## 2019-06-23 MED ORDER — ADULT MULTIVITAMIN W/MINERALS CH
1.0000 | ORAL_TABLET | Freq: Every day | ORAL | Status: DC
  Administered 2019-06-24 – 2019-06-27 (×4): 1 via ORAL
  Filled 2019-06-23 (×4): qty 1

## 2019-06-23 MED ORDER — IPRATROPIUM-ALBUTEROL 0.5-2.5 (3) MG/3ML IN SOLN
3.0000 mL | Freq: Four times a day (QID) | RESPIRATORY_TRACT | Status: DC
  Administered 2019-06-24 – 2019-06-28 (×17): 3 mL via RESPIRATORY_TRACT
  Filled 2019-06-23 (×16): qty 3

## 2019-06-23 MED ORDER — CHLORHEXIDINE GLUCONATE CLOTH 2 % EX PADS
6.0000 | MEDICATED_PAD | Freq: Every day | CUTANEOUS | Status: DC
  Administered 2019-06-23 – 2019-06-28 (×6): 6 via TOPICAL

## 2019-06-23 MED ORDER — ALBUTEROL SULFATE (2.5 MG/3ML) 0.083% IN NEBU: 2.5000 mg | INHALATION_SOLUTION | RESPIRATORY_TRACT | Status: DC | PRN

## 2019-06-23 NOTE — Progress Notes (Signed)
Responded to code blue in 2W13. On arrival room pt on 15L [place]. Spo2 60-70%. Awake & alert and able to speak in full sentences. Pt transferred to 2M06. Pt placed on HFNC 40L/100% & NRB. Pt tolerating well. Spo2 now 98%

## 2019-06-23 NOTE — Consult Note (Addendum)
NAME:  Howard Fox, MRN:  [id number], DOB:  [birth year], LOS: 1 ADMISSION DATE:  06/17/2019, CONSULTATION DATE:  01/31 REFERRING MD:  [facility], CHIEF COMPLAINT:  Respiratory distress    Brief History   59 year old male presented overnight with complaints of acute respiratory morning of 1/21 patient again seen in respiratory distress with refusal of supplemental oxygen resulting in profound desaturation, PCCM consulted for further management and transfer to ICU.  History of present illness   59 year old male with a past medical history significant for hypertension, hyperlipidemia, diabetes, and remote history of IV drug use who presented from home via EMS distress.  Per patient and family report patient woke up yesterday afternoon from-decided to take a bath patient's mother later found patient cyanotic and tachypneic on arrival EMS found patient's oxygen saturation was 58, after removal from bathtub and placement of nonrebreather patient became more responsive.  Of note patient was just discharged from this facility on 06/12/2019 after 10-day hospitalization from acute hypoxic respiratory failure due to a combination of COPD exacerbation pulmonary edema.  Patient was diagnosed with MRSA bacteremia during hospitalization but TEE was not performed due to hypoxia he was discharged on IV vancomycin for 6 weeks, PICC line was placed 06/07/2019.  It appears patient has been inconsistently utilizing supplemental oxygen upon discharge.  Patient reports recent discontinuation of smoking, 2 weeks ago.  He reports daily alcohol consumption of 2 beers.  Last drink day of admission.  Work-up on admission significant for profound leukocytosis, thrombocytosis, elevated lactic acid, and elevated procalcitonin.  Chest x-ray with diffuse interstitial opacities throughout both lungs that is progressed from prior imaging.   CCM consulted morning of 1/31 due to worsening.  It appears patient was agitated overnight with  request of leaving AMA and noncompliant with.  This coupled with acute infection resulted in profound hypoxia and respiratory distress at which time patient was transferred to ICU for further monitoring.  Past Medical History  MRSA bacteremia in both May 2020 in January 2021 Status post lumbar fusion 02/01/2019 Hypertension Hepatitis C Bipolar disease Tobacco abuse Alcohol abuse Anxiety  Significant Hospital Events   Admitted 1/30  Consults:  PCCM  Procedures:    Significant Diagnostic Tests:  CXR 1/30 > Postsurgical changes related to left lower lobectomy and resection of multiple left-sided ribs with extensive surgical wires, many of which are chronically disrupted. Partially visualized thoracolumbar fusion hardware. Diffuse interstitial opacities throughout both lungs, slightly progressed from prior.  Findings may reflect pulmonary edema versus atypical or viral infection.  Micro Data:  Covid PCR 1/30 > negative Blood culture 1/30 >  Antimicrobials:  Cefepime 1/30 > Vancomycin 1/30 >  Interim history/subjective:  Patient seen sitting up in bed in acute respiratory distress with tachypnea  Objective   Blood pressure 116/87, pulse 87, temperature 98.4 F (36.9 C), temperature source Oral, resp. rate (!) 25, height 6' (1.829 m), weight 103 kg, SpO2 (!) 88 %.    Vent Mode: BIPAP FiO2 (%):  [60 %-100 %] 60 %   Intake/Output Summary (Last 24 hours) at 06/23/2019 0908 Last data filed at 06/23/2019 0226 Gross per 24 hour  Intake --  Output 300 ml  Net -300 ml   Filed Weights   06/16/2019 1727  Weight: 103 kg    Examination: General: Chronically ill appearing elderly male, sitting up in bed in moderate respiratory distress prior to application of NRB HEENT: [place]/AT, MM pink/moist, PERRL,  Neuro: Alert and oriented, abel to follow all commands, non-focal  CV:  s1s2 regular rate and rhythm, no murmur, rubs, or gallops,  PULM:  Very diminished air entry bilaterally,  difficult to auscultate any presence of additional breaht sounds, currently on HFNC and NRB GI: soft, bowel sounds active in all 4 quadrants, non-tender, non-distended Extremities: warm/dry, no edema  Skin: no rashes or lesions   Resolved Hospital Problem list    Assessment & Plan:  Chronic hypoxic on chronic hypoxic respiratory failure -History of postsurgical changes related to left lower lobectomy and resection of multiple left-sided ribs with extensive surgical wires -Likely secondary to HCAP -As above patient was recently hospitalized at this facility for similar presentation diagnosed at that time with MRSA bacteremia.  P: Continue supplemental oxygen sats greater than 90% Utilize low-dose for Precedex for agitation to promote supplemental oxygen compliance Head of bed elevated 30 degrees. Follow intermittent chest x-ray and ABG.   Ensure adequate pulmonary hygiene  Follow cultures   Diffuse interstitial opacities throughout both lungs likely representative of multifocal pneumonia -Differential diagnosis include viral pneumonia and/or pulmonary edema P: Continue IV antibiotics  Pan culture, follow  Procalcitonin 0.33 Respiratory virus panel may be indicated Provide both scheduled and p.r.n. Bronchodilators  Continue IV steroids Supplemental oxygen as above Ensure frequent pulmoary hygiene  Repeat Covid PCR now  Recent MRSA bacteremia with history of T-spine discitis and fusion -ID consulted on prior admission and recommended 6 weeks of IV vancomycin, first dose appears to be 06/12/2019 -Patient did not receive TEE prior to admission due to profound hypoxia and later development of significant anxiety -Patient follows infectious disease at baseline with most recent recommendation of doxycycline x1 year, it appears patient self discontinued antibiotics prior to first admission P: Continue IV cefepime and vancomycin Consider reimaging of thoracic spine Consider TEE this  admission ID reconsulted Blood cultures, follow  HX of Hepatitis C -Follows infectious disease, has remote history of IV drug use P: Supportive care   Thrombocytosis  Platelets 404 P: Trend   Chronic pain  -Home medications include oxycodone, gabapentin, and muscle relaxants  P; Continue home medications Close monitoring of mentation  May need to decrease home meds if Precedex is needed    Bipolar disease  -Home medications include Celexa,  P: Continue home medications   Alcohol abuse  HX of IVDU -Patient reports daily consumption of alcohol  -Reports no IVDU over the last 3 yrs P: Monitor CIWA  Precedex for agitation  Thiamine. Multivitamin and folic acid replacement     HLD P: Continue statin   Best practice:  Diet: NPO Pain/Anxiety/Delirium protocol (if indicated): hmoe oxy and Precedex as needed  VAP protocol (if indicated): N/A DVT prophylaxis: Lovenox GI prophylaxis: PPI Glucose control: Monitor Mobility: Bedrest  Code Status: Full  Family Communication: Updated  Disposition: ICU   Labs   CBC: Recent Labs  Lab 06/10/2019 1730 06/10/2019 1737  WBC  --  29.4*  NEUTROABS  --  28.2*  HGB 13.9 13.7  HCT 41.0 42.3  MCV  --  88.3  PLT  --  404*    Basic Metabolic Panel: Recent Labs  Lab 05/27/2019 1730 06/21/2019 1737  NA 136 138  K 3.8 3.9  CL  --  101  CO2  --  25  GLUCOSE  --  147*  BUN  --  18  CREATININE  --  0.98  CALCIUM  --  8.9   GFR: Estimated Creatinine Clearance: 102 mL/min (by C-G formula based on SCr of 0.98 mg/dL). Recent Labs  Lab 05/24/2019  1737 06/19/2019 1951  PROCALCITON 0.33  --   WBC 29.4*  --   LATICACIDVEN 2.9* 1.2    Liver Function Tests: Recent Labs  Lab 06/08/2019 1737  AST 38  ALT 61*  ALKPHOS 126  BILITOT 0.3  PROT 6.8  ALBUMIN 2.3*   No results for input(s): LIPASE, AMYLASE in the last 168 hours. No results for input(s): AMMONIA in the last 168 hours.  ABG    Component Value Date/Time   PHART  7.473 (H) 06/23/2019 0810   PCO2ART 35.6 06/23/2019 0810   PO2ART 44.5 (L) 06/23/2019 0810   HCO3 25.8 06/23/2019 0810   TCO2 27 06/08/2019 1730   O2SAT 80.9 06/23/2019 0810     Coagulation Profile: Recent Labs  Lab 05/27/2019 1737  INR 1.2    Cardiac Enzymes: No results for input(s): CKTOTAL, CKMB, CKMBINDEX, TROPONINI in the last 168 hours.  HbA1C: Hgb A1c MFr Bld  Date/Time Value Ref Range Status  10/25/2018 10:10 AM 5.6 <5.7 % of total Hgb Final    Comment:    For the purpose of screening for the presence of diabetes: . <5.7%       Consistent with the absence of diabetes 5.7-6.4%    Consistent with increased risk for diabetes             (prediabetes) > or =6.5%  Consistent with diabetes . This assay result is consistent with a decreased risk of diabetes. . Currently, no consensus exists regarding use of hemoglobin A1c for diagnosis of diabetes in children. . According to American Diabetes Association (ADA) guidelines, hemoglobin A1c <7.0% represents optimal control in non-pregnant diabetic patients. Different metrics may apply to specific patient populations.  Standards of Medical Care in Diabetes(ADA). Marland Kitchen   10/08/2018 05:17 AM 5.7 (H) 4.8 - 5.6 % Final    Comment:    (NOTE) Pre diabetes:          5.7%-6.4% Diabetes:              >6.4% Glycemic control for   <7.0% adults with diabetes     CBG: No results for input(s): GLUCAP in the last 168 hours.  Review of Systems:   Unable to obtain secondary to respiratory distress   Past Medical History  He,  has a past medical history of Anxiety (10/25/2018), Arthritis, Bipolar 1 disorder ([place]), Depression, Fracture, ribs (1993), Heart murmur, Hepatitis C, History of kidney stones, Hypertension, Kidney stones, Low blood pressure (02/20/2019), MRSA bacteremia (10/09/2018), S/P lumbar fusion (02/01/2019), and Vertebral osteomyelitis ([place]) (10/25/2018).   Surgical History    Past Surgical History:  Procedure Laterality  Date  . CHOLECYSTECTOMY    . LAMINECTOMY WITH POSTERIOR LATERAL ARTHRODESIS LEVEL 4 N/A 02/01/2019   Procedure: Laminectomy and Foraminotomy - Thoracic ten-Thoracic eleven, Instrumented fusion Thoracic eight-Lumbar two;  Surgeon: Eustace Moore, MD;  Location: [facility];  Service: Neurosurgery;  Laterality: N/A;  . laser surgery for kidney stones Left   . LITHOTRIPSY       Social History   reports that he quit smoking about 2 weeks ago. His smoking use included cigarettes. He started smoking about 42 years ago. He has a 21.00 pack-year smoking history. He has never used smokeless tobacco. He reports current alcohol use. He reports current drug use. Frequency: 1.00 time per week. Drug: Marijuana.   Family History   His family history includes Arthritis in his father; Mental illness in his mother; Stroke in his father.   Allergies Allergies  Allergen Reactions  .  Codeine Itching  . Dilaudid [Hydromorphone Hcl] Itching  . Shellfish Allergy Diarrhea and Nausea And Vomiting     Home Medications  Prior to Admission medications   Medication Sig Start Date End Date Taking? Authorizing Provider  albuterol (VENTOLIN HFA) 108 (90 Base) MCG/ACT inhaler Use 2 Inhalations 15 minutes apart every 4 hours to Rescue Asthma Patient taking differently: Inhale 2 puffs into the lungs every 4 (four) hours as needed for wheezing or shortness of breath.  02/16/19  Yes Unk Pinto, MD  ALPRAZolam Duanne Moron) 0.5 MG tablet Take 1 tablet (0.5 mg total) by mouth 3 (three) times daily as needed for anxiety. 06/12/19  Yes Georgette Shell, MD  amitriptyline (ELAVIL) 150 MG tablet TAKE 1 TABLET BY MOUTH AT BEDTIME FOR SLEEP AND MOOD Patient taking differently: Take 150 mg by mouth at bedtime.  03/15/19  Yes Liane Comber, NP  aspirin EC 325 MG tablet Take 325 mg by mouth daily.    Yes [provider]  carisoprodol (SOMA) 350 MG tablet Take 350 mg by mouth every 6 (six) hours as needed for muscle spasms.     Yes [provider]  cholecalciferol (VITAMIN D3) 25 MCG (1000 UT) tablet Take 5,000 Units by mouth daily.   Yes [provider]  citalopram (CELEXA) 40 MG tablet Take 1 tablet (40 mg total) by mouth daily with lunch. Patient taking differently: Take 20 mg by mouth daily with lunch.  04/08/19  Yes Liane Comber, NP  furosemide (LASIX) 40 MG tablet Take 1 tablet (40 mg total) by mouth 2 (two) times daily. 06/12/19 06/11/20 Yes Georgette Shell, MD  gabapentin (NEURONTIN) 300 MG capsule Take 1 capsule 3 x /day if needed for Pain Patient taking differently: Take 300 mg by mouth daily.  05/05/19  Yes Unk Pinto, MD  Ipratropium-Albuterol (COMBIVENT) 20-100 MCG/ACT AERS respimat Inhale 1 puff into the lungs every 6 (six) hours.   Yes [provider]  Menthol-Camphor (ICY HOT ADVANCED RELIEF) 16-11 % CREA Apply 1 application topically 4 (four) times daily as needed (pain).    Yes [provider]  oxyCODONE (OXY IR/ROXICODONE) 5 MG immediate release tablet Take 5-10 mg by mouth every 6 (six) hours as needed for moderate pain or severe pain.  05/28/19  Yes [provider]  polyethylene glycol (MIRALAX / GLYCOLAX) 17 g packet Take 17 g by mouth daily as needed. Patient taking differently: Take 17 g by mouth daily as needed for mild constipation.  10/15/18  Yes Shelly Coss, MD  potassium chloride (KLOR-CON) 10 MEQ tablet Take 1 tablet (10 mEq total) by mouth daily. 06/12/19  Yes Georgette Shell, MD  predniSONE (DELTASONE) 10 MG tablet Take 4 tablets daily for first 4 days then 3 tablets daily for the next 4 days then 2 tablets daily for the following 4 days and then 10 mg daily till all the medications are done. 06/12/19  Yes Georgette Shell, MD  rosuvastatin (CRESTOR) 40 MG tablet TAKE 1/2 TO 1 (ONE-HALF TO ONE) TABLET ONCE DAILY OR  AS DIRECTED FOR CHOLESTEROL Patient taking differently: Take 40 mg by mouth daily.  05/30/19  Yes Unk Pinto,  MD  sodium chloride (OCEAN) 0.65 % SOLN nasal spray Place 1 spray into both nostrils as needed for congestion. 06/12/19  Yes Georgette Shell, MD  vancomycin IVPB Inject 750 mg into the vein every 8 (eight) hours. Indication: MRSA bactermia Last Day of Therapy: 07/17/2019 Labs - Sunday/Monday:  CBC/D, CMP, and vancomycin trough.  Labs - Thursday:  BMP and vancomycin trough Labs - Every other week:  ESR and CRP 06/12/19 07/18/19 Yes Georgette Shell, MD     Critical care time:   CRITICAL CARE Performed by: Johnsie Cancel   Total critical care time: 45  minutes  Critical care time was exclusive of separately billable procedures and treating other patients.  Critical care was necessary to treat or prevent imminent or life-threatening deterioration.  Critical care was time spent personally by me on the following activities: development of treatment plan with patient and/or surrogate as well as nursing, discussions with consultants, evaluation of patient's response to treatment, examination of patient, obtaining history from patient or surrogate, ordering and performing treatments and interventions, ordering and review of laboratory studies, ordering and review of radiographic studies, pulse oximetry and re-evaluation of patient's condition.  Johnsie Cancel, NP-C [facility] Contact / Pager information can be found on Amion  06/23/2019, 10:09 AM  Attending Note:  59 year old male with tobacco and etoh abuse who had chest trauma and is missing multiple ribs presenting with acute hypoxemic respiratory failure.  Patient is very short of breath and unable to provide much history.  On exam, breathing 40 BPM with HR of 120 and normal SBP and decreased BS diffusely.  I reviewed CXR myself, multiple ribs missing on the left and infiltrate diffusely.  Discussed with PCCM-NP.  Transfer emergently to the ICU.  Start precedex to calm patient down as he continues to take off his  O2.  If WOB continues to be this elevated then will intubate.  Initial PCR test is negative for COVID but high index of suspicion.  Will move patient to 49M under isolation and repeat the test.  If negative will remove isolation is positive will move to 56M.  I am concerned about moving to 56M and he is negative then get him infected but at the same time the history and radiologic findings are highly concerning for COVID and would like to keep staff safe so will bring to 49M under precautions and pending test result will react to that.  In the meantime, continue anti-bacterials.  Continue close monitoring.  HFNC and NRB on.  Precedex to calm him down.  PCCM will assume care.  The patient is critically ill with multiple organ systems failure and requires high complexity decision making for assessment and support, frequent evaluation and titration of therapies, application of advanced monitoring technologies and extensive interpretation of multiple databases.   Critical Care Time devoted to patient care services described in this note is  45  Minutes. This time reflects time of care of this signee Dr Jennet Maduro. This critical care time does not reflect procedure time, or teaching time or supervisory time of PA/NP/Med student/Med Resident etc but could involve care discussion time.  Rush Farmer, M.D. [facility].

## 2019-06-23 NOTE — Progress Notes (Signed)
Pt was taken off of BIPAP and placed on HFNC [place] on 10Lpm (60%) FIO2. Pt tolerating at this time. RT will continue to monitor.

## 2019-06-23 NOTE — Progress Notes (Signed)
Late Note: RT placed pt back on BiPAP V60 once he arrived to the floor. Pt was on NRB 100% 15 Lpm, pt sats dropped while on NRB. Once placed on BIPAP pt sats normalized to 100%. RT will continue to monitor.

## 2019-06-23 NOTE — Progress Notes (Signed)
Patient adamantly stating he is leaving ama and that he was "tricked" to coming in. Denies that he was hypoxic in a bathtub and states his mother is lying about the condition he was found in by EMS. Patient currently on 15L HFNC and maintaining sats at only 88%. Pt states he has oxygen at home, per notes patient was dc'd w/ only 3L last admission. This RN explained extensively and plainly the difference of 3L and 15L. This RN told patient the unfortunate but likely poor outcome if patient left hospital against medical advice while requiring this amount of oxygen. Patient stated "that is fine, I want to die. Who wants to live forever? I am ready to die!" Patient states his ride is on the way. This RN questions decision making capacity of patient. Duaine Dredge MD to come speak with and assess patient.

## 2019-06-23 NOTE — Progress Notes (Signed)
  Echocardiogram 2D Echocardiogram has been performed.  Darlina Sicilian M 06/23/2019, 2:15 PM

## 2019-06-23 NOTE — Progress Notes (Signed)
   06/23/19 1200  Clinical Encounter Type  Visited With Health care provider;Patient not available  Visit Type Initial;Code  Referral From Other (Comment) (code blue pg)  Stress Factors  Patient Stress Factors Health changes   Responded to code blue for pt, who was subsequently transferred to 6M.  No family present per [facility].  Let charge RN know I am available via page for additional support if desired.  Temple Pacini [place], [phone]

## 2019-06-23 NOTE — Significant Event (Signed)
Rapid Response Event Note  Overview: Respiratory Distress - Hypoxia  Initial Focused Assessment: Called by staff with concerns of patient having worsening shortness of breath, increased work of breathing, and oxygen saturations were in the 60s. Patient was on HFNC 15L, he refused to wear a NRB and BIPAP. Upon arrival, staff informed that patient was very agitated and completely inappropriate. I went in to see him, he could not stop talking but he was quite confused and delusional. We could not reason with him. A CXR and ABG had be obtained already and the nurse had given the patient some Ativan IV and Lasix IV - he had to void and insisted that he stand up to do so. TRH MD came the bedside, PCCM was consulted. We explained to him that we cannot let him get up but he was quite agitated and so I told him that he could stand next to the bed because his agitation was making his RR and WOB worse. But after getting back in bed, he was in profound distress - oxygen saturations dropped to 25% with a good pleth on HFNC 15L, I immediately called for the MD and asked the staff to initiate a Code Blue. Even though he was breathing and awake, he was complete distress and I needed to facilitate immediate resources to the bedside. PCCM MD and [facility] team arrived. Decision made to emergently transfer the patient to the ICU. Patient was placed on NRB 15L in conjunction with HFNC 15L. Upon arrival to the ICU - saturations improved to 90-92%.   Interventions: -- Emergent Transfer to the 2M06   Plan of Care: -- None  Event Summary:  Start Time 0758 Arrival Time 0803  End Time 0904  Darinda [place] R

## 2019-06-23 NOTE — Progress Notes (Signed)
[facility]    Date of Admission:  06/02/2019   Total days of antibiotics 20 of vanco/day 2 of cefepime           ID: Howard Fox is a 59 y.o. 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 Now readmitted for confusion, hypoxia, with CXR showing worsening bilateral disease, ruled out for covid-19 again.  Principal Problem:   Chronic pain syndrome Active Problems:   Bipolar disorder ([place])   Essential hypertension   Hyperlipidemia, mixed   Obesity (BMI 30.0-34.9)   Hepatitis C, chronic (HCC)   MRSA bacteremia   Acute respiratory failure with hypoxia (HCC)   Thrombocytosis (HCC)    Subjective: Improvement in hypoxia. He doesn't recall much of what brought him into the hospital. He remains afebrile. Did you have leukocytosis of up to 29 but now down to 19  Medications:  . amitriptyline  150 mg Oral QHS  . aspirin EC  325 mg Oral Daily  . budesonide (PULMICORT) nebulizer solution  0.5 mg Nebulization BID  . Chlorhexidine Gluconate Cloth  6 each Topical Daily  . citalopram  20 mg Oral Q lunch  . enoxaparin (LOVENOX) injection  40 mg Subcutaneous Q24H  . folic acid  1 mg Oral Daily  . furosemide  80 mg Intravenous BID  . gabapentin  300 mg Oral Daily  . ipratropium-albuterol  3 mL Nebulization Q4H  . methylPREDNISolone (SOLU-MEDROL) injection  60 mg Intravenous Q8H  . multivitamin with minerals  1 tablet Oral Daily  . pantoprazole (PROTONIX) IV  40 mg Intravenous Q12H  . potassium chloride  10 mEq Oral Daily  . rosuvastatin  40 mg Oral Daily  . thiamine  100 mg Oral Daily   Or  . thiamine  100 mg Intravenous Daily    Objective: Vital signs in last 24 hours: Temp:  [97.9 F (36.6 C)-98.4 F (36.9 C)] 98.4 F (36.9 C) (01/31 0716) Pulse Rate:   [85-114] 98 (01/31 1300) Resp:  [16-41] 35 (01/31 1300) BP: (94-137)/(60-88) 124/87 (01/31 1300) SpO2:  [77 %-100 %] 89 % (01/31 1300) FiO2 (%):  [60 %-100 %] 100 % (01/31 1145) Weight:  [id number] kg] 103 kg (01/30 1727) Physical Exam  Constitutional: He is oriented to person, place, and time. He appears well-developed and well-nourished. No distress.  HENT:  Mouth/Throat: Oropharynx is clear and moist. No oropharyngeal exudate.  Cardiovascular: Normal rate, regular rhythm and normal heart sounds. Exam reveals no gallop and no friction rub.  No murmur heard.  Pulmonary/Chest: Effort normal and breath sounds normal. No respiratory distress. He has no wheezes.  Abdominal: Soft. Bowel sounds are normal. He exhibits no distension. There is no tenderness.  Ext" picc line is c/d/i Neurological: He is alert and oriented to person, place, and time.  Skin: Skin is warm and dry. No rash noted. No erythema.  Psychiatric: He has a normal mood and affect. His behavior is normal.     Lab Results Recent Labs    06/05/2019 1737 06/23/19 0952  WBC 29.4* 19.8*  HGB 13.7 13.1  HCT 42.3 40.4  NA 138 141  K 3.9 4.1  CL 101 103  CO2 25 25  BUN 18 23*  CREATININE 0.98 0.78   Liver Panel Recent Labs  05/24/2019 1737 06/23/19 0952  PROT 6.8 6.9  ALBUMIN 2.3* 2.3*  AST 38 36  ALT 61* 56*  ALKPHOS 126 122  BILITOT 0.3 0.7   Sedimentation Rate No results for input(s): ESRSEDRATE in the last 72 hours. C-Reactive Protein Recent Labs    06/23/19 0952  CRP 16.9*    Microbiology: 1/30 blood cx ngtd Studies/Results: DG CHEST PORT 1 VIEW  Result Date: 06/23/2019 CLINICAL DATA:  Respiratory distress EXAM: PORTABLE CHEST 1 VIEW COMPARISON:  Chest radiograph 05/29/2019 FINDINGS: Right upper extremity PICC line tip projects over the superior vena cava. Monitoring leads overlie the patient. Stable cardiac and mediastinal contours. Similar-appearing extensive postsurgical changes involving the left  hemithorax. Similar diffuse bilateral interstitial pulmonary opacities. Thoracic spinal fusion hardware. IMPRESSION: Similar bilateral interstitial pulmonary opacities. Similar extensive postsurgical changes left hemithorax. Electronically Signed   By: Lovey Newcomer M.D.   On: 06/23/2019 08:26   DG Chest Port 1 View  Result Date: 06/23/2019 CLINICAL DATA:  Cough EXAM: PORTABLE CHEST 1 VIEW COMPARISON:  06/12/2019, 06/06/2019 FINDINGS: Postsurgical changes related to left lower lobectomy and resection of multiple left-sided ribs with extensive surgical wires, many of which are chronically disrupted. Partially visualized thoracolumbar fusion hardware. Grossly stable cardiomediastinal contours. Diffuse interstitial opacities throughout both lungs, slightly progressed from prior. No pneumothorax is seen. IMPRESSION: Diffuse interstitial opacities throughout both lungs, slightly progressed from prior. Findings may reflect pulmonary edema versus atypical or viral infection. Electronically Signed   By: Davina Poke D.O.   On: 06/16/2019 17:55     Assessment/Plan: Respiratory distress = unclear etiology, viral causes ruled out. Will check covid ab (though I suspect it will be negative). He has intact EF from TTE 2 weeks ago. If worsens, would recommend bronchoscopy. Would send sputum sample  For culture. Continue with vanco/cefepime for the time being  Complicated MRSA bacteremia = he was being treated for 6 wk due since did not due TEE. He is to take til feb 24th. Continue on contact isolation  Dr Lucianne Lei dam to see.  [facility] Cell: [phone] Pager: [phone]  06/23/2019, 2:24 PM

## 2019-06-23 NOTE — Progress Notes (Signed)
Patient refused Bipap but unable to maintain O2 sats on 15 L O2 via N/C.  Lasix, Solumedrol and Ativan given per orders with little effect.  MD and Rapid Response were called to patient's room.  O2 sats dropped through the 40's.  Patient transferred to ICU.  Report given to nurse.  Patient remained alert and responsive throughout.

## 2019-06-23 NOTE — Consult Note (Signed)
Palliative Medicine  Name: [name] Date: 06/23/2019 MRN: [id number]  DOB: 1961/02/16  Patient Care Team: Unk Pinto, MD as PCP - General (Internal Medicine) Nickie Retort, MD as Consulting Physician (Urology)    REASON FOR CONSULTATION: Howard Fox is a 59 y.o. male with multiple medical problems including bipolar disease, COPD, chronic T10-T11 osteo/discitis previously on chronic doxy until December 2020, chronic pain syndrome, history of IV drug use use (sober 3 years), hep C status post treatment, who was previously hospitalized 06/03/2019-06/12/2019 with hypoxic respiratory failure secondary to pneumonia/pulmonary edema/COPD exacerbation was found to have MRSA bacteremia thought secondary to T-spine discitis.  Patient was readmitted on 06/20/2019 with hypoxic respiratory failure after being found unresponsive in the bathtub at home.  Chest x-ray showed progressive multifocal pneumonia.  A CODE BLUE was called on 06/23/2019 due to severe hypoxia after patient was refusing to wear oxygen.  He was transferred to the ICU.  Palliative care was consulted to help address goals.  SOCIAL HISTORY:     reports that he quit smoking about 2 weeks ago. His smoking use included cigarettes. He started smoking about 42 years ago. He has a 21.00 pack-year smoking history. He has never used smokeless tobacco. He reports current alcohol use. He reports current drug use. Frequency: 1.00 time per week. Drug: Marijuana.   Patient is unmarried.  He lives at home with his 76 year old mother.  He has a sister but patient says that he does not often see her.  Patient says he has a daughter in her 50s from whom he is estranged.  ADVANCE DIRECTIVES:  Not on file  CODE STATUS: Full code  PAST MEDICAL HISTORY: Past Medical History:  Diagnosis Date   Anxiety 10/25/2018   Arthritis    Bipolar 1 disorder ([place])    Depression    Fracture, ribs 1993   ruptured stomach, left lung resulted  from industrial fall   Heart murmur    as a child   Hepatitis C    History of kidney stones    Hypertension    Kidney stones    Low blood pressure 02/20/2019   MRSA bacteremia 10/09/2018   S/P lumbar fusion 02/01/2019   Vertebral osteomyelitis ([place]) 10/25/2018    PAST SURGICAL HISTORY:  Past Surgical History:  Procedure Laterality Date   CHOLECYSTECTOMY     LAMINECTOMY WITH POSTERIOR LATERAL ARTHRODESIS LEVEL 4 N/A 02/01/2019   Procedure: Laminectomy and Foraminotomy - Thoracic ten-Thoracic eleven, Instrumented fusion Thoracic eight-Lumbar two;  Surgeon: Eustace Moore, MD;  Location: [facility];  Service: Neurosurgery;  Laterality: N/A;   laser surgery for kidney stones Left    LITHOTRIPSY      HEMATOLOGY/ONCOLOGY HISTORY:  Oncology History   No history exists.    ALLERGIES:  is allergic to codeine; dilaudid [hydromorphone hcl]; and shellfish allergy.  MEDICATIONS:  Current Facility-Administered Medications  Medication Dose Route Frequency Provider Last Rate Last Admin   albuterol (PROVENTIL) (2.5 MG/3ML) 0.083% nebulizer solution 2.5 mg  2.5 mg Inhalation Q2H PRN Aline August, MD       amitriptyline (ELAVIL) tablet 150 mg  150 mg Oral QHS Tu, Ching T, DO       aspirin EC tablet 325 mg  325 mg Oral Daily Tu, Ching T, DO   325 mg at 06/23/19 0826   budesonide (PULMICORT) nebulizer solution 0.5 mg  0.5 mg Nebulization BID Aline August, MD   0.5 mg at 06/23/19 6440   carisoprodol (SOMA)  tablet 350 mg  350 mg Oral Q6H PRN Tu, Ching T, DO       ceFEPIme (MAXIPIME) 2 g in sodium chloride 0.9 % 100 mL IVPB  2 g Intravenous Q8H Duanne Limerick, RPH   Stopped at 06/23/19 1108   Chlorhexidine Gluconate Cloth 2 % PADS 6 each  6 each Topical Daily Rush Farmer, MD   6 each at 06/23/19 1000   citalopram (CELEXA) tablet 20 mg  20 mg Oral Q lunch Tu, Ching T, DO   20 mg at 06/23/19 1200   dexmedetomidine (PRECEDEX) 400 MCG/100ML (4 mcg/mL) infusion  0.4-1.2 mcg/kg/hr  Intravenous Titrated Merlene Laughter F, NP   Stopped at 06/23/19 1204   enoxaparin (LOVENOX) injection 40 mg  40 mg Subcutaneous Q24H Tu, Ching T, DO   40 mg at 45/40/98 1191   folic acid (FOLVITE) tablet 1 mg  1 mg Oral Daily Merlene Laughter F, NP       furosemide (LASIX) injection 80 mg  80 mg Intravenous BID Aline August, MD   80 mg at 06/23/19 4782   gabapentin (NEURONTIN) capsule 300 mg  300 mg Oral Daily Tu, Ching T, DO   300 mg at 06/23/19 0827   ipratropium-albuterol (DUONEB) 0.5-2.5 (3) MG/3ML nebulizer solution 3 mL  3 mL Nebulization Q4H Tu, Ching T, DO   3 mL at 06/23/19 1627   LORazepam (ATIVAN) injection 0.5 mg  0.5 mg Intravenous Q4H PRN Aline August, MD   0.5 mg at 06/23/19 0834   methylPREDNISolone sodium succinate (SOLU-MEDROL) 125 mg/2 mL injection 60 mg  60 mg Intravenous Q8H Alekh, Kshitiz, MD   60 mg at 06/23/19 1550   multivitamin with minerals tablet 1 tablet  1 tablet Oral Daily Merlene Laughter F, NP       oxyCODONE (Oxy IR/ROXICODONE) immediate release tablet 5-10 mg  5-10 mg Oral Q6H PRN Tu, Ching T, DO   10 mg at 06/23/19 0827   pantoprazole (PROTONIX) injection 40 mg  40 mg Intravenous Q12H Merlene Laughter F, NP   40 mg at 06/23/19 1032   potassium chloride (KLOR-CON) CR tablet 10 mEq  10 mEq Oral Daily Tu, Ching T, DO   10 mEq at 06/23/19 9562   rosuvastatin (CRESTOR) tablet 40 mg  40 mg Oral Daily Tu, Ching T, DO   40 mg at 06/23/19 0825   thiamine tablet 100 mg  100 mg Oral Daily Merlene Laughter F, NP       Or   thiamine (B-1) injection 100 mg  100 mg Intravenous Daily Merlene Laughter F, NP   100 mg at 06/23/19 1032   vancomycin (VANCOREADY) IVPB 1500 mg/300 mL  1,500 mg Intravenous Q12H Duanne Limerick, RPH 150 mL/hr at 06/23/19 1300 Rate Verify at 06/23/19 1300    VITAL SIGNS: BP (!) 142/80    Pulse 95    Temp 97.9 F (36.6 C) (Oral)    Resp (!) 25    Ht 6' (1.829 m)    Wt 227 lb (103 kg)    SpO2 94%    BMI 30.79 kg/m  Filed Weights   06/05/2019 1727   Weight: 227 lb (103 kg)    Estimated body mass index is 30.79 kg/m as calculated from the following:   Height as of this encounter: 6' (1.829 m).   Weight as of this encounter: 227 lb (103 kg).  LABS: CBC:    Component Value Date/Time   WBC 19.8 (H) 06/23/2019 0952   HGB 13.1  06/23/2019 0952   HCT 40.4 06/23/2019 0952   PLT 374 06/23/2019 0952   MCV 87.4 06/23/2019 0952   NEUTROABS 18.2 (H) 06/23/2019 0952   LYMPHSABS 0.8 06/23/2019 0952   MONOABS 0.7 06/23/2019 0952   EOSABS 0.0 06/23/2019 0952   BASOSABS 0.0 06/23/2019 0952   Comprehensive Metabolic Panel:    Component Value Date/Time   NA 141 06/23/2019 0952   K 4.1 06/23/2019 0952   CL 103 06/23/2019 0952   CO2 25 06/23/2019 0952   BUN 23 (H) 06/23/2019 0952   CREATININE 0.78 06/23/2019 0952   CREATININE 0.77 04/08/2019 1159   GLUCOSE 114 (H) 06/23/2019 0952   CALCIUM 9.1 06/23/2019 0952   AST 36 06/23/2019 0952   ALT 56 (H) 06/23/2019 0952   ALT 20 11/06/2018 1227   ALKPHOS 122 06/23/2019 0952   BILITOT 0.7 06/23/2019 0952   PROT 6.9 06/23/2019 0952   ALBUMIN 2.3 (L) 06/23/2019 0952    RADIOGRAPHIC STUDIES: DG Chest 1 View  Result Date: 06/12/2019 CLINICAL DATA:  Shortness of breath. Hypoxemia. EXAM: CHEST  1 VIEW COMPARISON:  Chest x-rays dated 06/10/2019 and 06/09/2019 and chest CT dated 06/06/2019 FINDINGS: The faint diffuse pulmonary infiltrates persist, unchanged as demonstrated on the prior chest CT. Heart size and pulmonary vascularity are normal. Prior resection of multiple left posteroinferior ribs with multiple surgical wires, some of which are chronically disrupted. Harrington rods in the thoracolumbar spine. IMPRESSION: Stable faint bilateral pulmonary infiltrates. Electronically Signed   By: Lorriane Shire M.D.   On: 06/12/2019 12:35   DG Chest 1 View  Result Date: 06/10/2019 CLINICAL DATA:  Worsening shortness of breath with fever and chills. EXAM: CHEST  1 VIEW COMPARISON:  June 08, 2018  FINDINGS: There is stable right-sided PICC line positioning. Multiple radiopaque surgical sutures are seen overlying the mid left lung and left upper quadrant. There is mild persistent left-sided volume loss with stable diffusely increased interstitial lung markings. Very mild, stable left basilar atelectasis and/or infiltrate is seen. The heart size and mediastinal contours are within normal limits. Bilateral pedicle screws are seen within the mid and lower thoracic spine. IMPRESSION: 1. No significant interval change compared to the prior chest plain film dated June 09, 2019. Electronically Signed   By: Virgina Norfolk M.D.   On: 06/10/2019 18:11   DG Chest 1 View  Result Date: 06/09/2019 CLINICAL DATA:  Hypoxemia EXAM: CHEST  1 VIEW COMPARISON:  Radiograph 06/06/2019, CT 06/06/2019 FINDINGS: Cardiac silhouette is enlarged. Thoracotomy defect with removal of LEFT posterior ribs. LEFT basilar atelectasis unchanged. Diffuse fine airspace disease throughout the lungs similar prior. PICC line with tip in distal SVC. IMPRESSION: 1. Diffuse airspace disease favoring pulmonary edema. 2. LEFT lower lobe atelectasis and effusion. 3. Postsurgical change in the LEFT hemithorax. Electronically Signed   By: Suzy Bouchard M.D.   On: 06/09/2019 09:42   DG Chest 1 View  Result Date: 06/06/2019 CLINICAL DATA:  Fever, shortness of breath, cough EXAM: CHEST  1 VIEW COMPARISON:  06/02/2019 FINDINGS: Interval worsening of diffuse bilateral interstitial airspace opacity. Unchanged cardiomegaly. Postoperative findings of posterior left chest wall resection. IMPRESSION: Interval worsening of diffuse bilateral interstitial airspace opacity, consistent with worsening edema and/or infection. Electronically Signed   By: Eddie Candle M.D.   On: 06/06/2019 09:56   CT CHEST W CONTRAST  Result Date: 06/06/2019 CLINICAL DATA:  58 year old male with history of hypoxemia. EXAM: CT CHEST WITH CONTRAST TECHNIQUE: Multidetector CT  imaging of the chest was performed during  intravenous contrast administration. CONTRAST:  46m OMNIPAQUE IOHEXOL 300 MG/ML  SOLN COMPARISON:  Chest CTA 10/08/2018. FINDINGS: Cardiovascular: Heart size is normal. There is no significant pericardial fluid, thickening or pericardial calcification. There is aortic atherosclerosis, as well as atherosclerosis of the great vessels of the mediastinum and the coronary arteries, including calcified atherosclerotic plaque in the left main, left anterior descending, left circumflex and right coronary arteries. Mediastinum/Nodes: Prominent but nonenlarged mediastinal and hilar lymph nodes, measuring up to 1.1 cm in short axis in the prevascular nodal station, nonspecific. Esophagus is unremarkable in appearance. No axillary lymphadenopathy. Lungs/Pleura: Status post left thoracotomy for left lower lobectomy. Compensatory hyperexpansion of the left upper lobe. Widespread areas of ground-glass attenuation and septal thickening are noted throughout the lungs bilaterally, most evident throughout the mid to upper lungs. Several areas demonstrate relative sparing of the subpleural lung. No pleural effusions. No definite suspicious appearing pulmonary nodules or masses. Upper Abdomen: Aortic atherosclerosis.  Status post cholecystectomy. Musculoskeletal: Postoperative changes of left-sided thoracotomy and chest wall resection. New posterior rod and screw fixation device starting at T8 and extending below the lower margin of the images, traversing chronic compression fractures of T10 and T11 which both demonstrate greater than 90% loss of anterior vertebral body height resultant in an acute kyphotic deformity at the level of T10/T11. There are no aggressive appearing lytic or blastic lesions noted in the visualized portions of the skeleton. IMPRESSION: 1. Widespread areas of ground-glass attenuation and septal thickening most evident throughout the mid to upper lungs, with some areas of  subpleural sparing. Findings are nonspecific, favored to be of infectious or inflammatory etiology. 2. Aortic atherosclerosis, in addition to left main and 3 vessel coronary artery disease. Please note that although the presence of coronary artery calcium documents the presence of coronary artery disease, the severity of this disease and any potential stenosis cannot be assessed on this non-gated CT examination. Assessment for potential risk factor modification, dietary therapy or pharmacologic therapy may be warranted, if clinically indicated. 3. Postoperative changes, as above. Aortic Atherosclerosis (ICD10-I70.0). Electronically Signed   By: DVinnie LangtonM.D.   On: 06/06/2019 10:49   MR THORACIC SPINE WO CONTRAST  Result Date: 06/05/2019 CLINICAL DATA:  Mid back pain. Febrile. Prior fusion from T8-L2. History of vertebral osteomyelitis. EXAM: MRI THORACIC AND LUMBAR SPINE WITHOUT CONTRAST TECHNIQUE: Multiplanar and multiecho pulse sequences of the thoracic and lumbar spine were obtained without intravenous contrast. The patient refused to continue because he got hot. He said he would return at another time for contrast. COMPARISON:  Chest x-ray dated 06/02/2019 and thoracic radiographs dated 05/23/2019 and thoracic MRI dated 09/30/2018 FINDINGS: MRI THORACIC SPINE FINDINGS Alignment: Accentuated lower thoracic kyphosis secondary to previous bone destruction at T10-11. Vertebrae: Hardware obscures some detail from T8 distally. No visible discitis or osteomyelitis or other significant acute bone abnormality. Cord: The visualized portion of the thoracic spinal cord appears normal. Hardware obscures the spinal cord at T10 extending distally. Paraspinal and other soft tissues: There is an abnormal appearance of both lungs, left worse than right. Recent chest x-ray of 06/02/2019 did not demonstrate significant abnormalities. This appearance may be artifactual. Disc levels: There are tiny disc bulges at T2-3 and  T3-4 without neural impingement. The discs from C7-T1 through T6-7 are otherwise normal. No spinal or foraminal stenoses. T7-8: Tiny disc bulge to the right of midline with no neural impingement. T8-9: Negative. T9-10: Negative. T10-11: The details of the T10 and T11 vertebral bodies is obscured by hardware.  The appearance suggests that those vertebral bodies have fused with Ace secondary anterior wedge deformity due to the previous osteomyelitis. T11-12: No disc bulging or protrusion. T12-L1: No visible abnormality. MRI LUMBAR SPINE FINDINGS Segmentation:  Standard. Alignment:  2 mm retrolisthesis of L4 on L5. Vertebrae: Pedicle screws extend from T8-L2. No acute abnormality of the lumbar spine. Conus medullaris and cauda equina: Conus extends to the L1-2 level. Conus and cauda equina appear normal. Paraspinal and other soft tissues: Negative. Disc levels: T12-L1 and L1-2: Normal disc. Pedicle screws. L2-3: Small broad-based disc bulge without neural impingement. No facet arthritis. Pedicle screws in L2. L3-4: Small broad-based disc bulge asymmetric into the left neural foramen. Moderate bilateral facet arthritis with ligamentum flavum hypertrophy creating mild spinal stenosis with slight left foraminal stenosis. However, the left L3 nerve exits without impingement. Nearing of both lateral recesses. L4-5: Small disc bulge slightly asymmetric to the right. Moderate hypertrophy and arthritis of the right facet joint. No neural impingement. L5-S1: Marked disc space narrowing with degenerative changes of the vertebral endplates. Small broad-based disc protrusion with accompanying osteophytes extending into both neural foramina without severe foraminal stenosis. No focal neural impingement. Minimal degenerative changes of the right facet joint. IMPRESSION: MR THORACIC SPINE IMPRESSION No discrete osteomyelitis or discitis in the thoracic spine. Detail is limited at the level of the previous discitis and osteomyelitis at  T10-11. 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 The thoracic spine otherwise demonstrates no significant abnormalities. MR LUMBAR SPINE IMPRESSION 1. 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 2. Moderate right facet arthritis at L4-5. Electronically Signed   By: Lorriane Shire M.D.   On: 06/05/2019 14:57   MR LUMBAR SPINE WO CONTRAST  Result Date: 06/05/2019 CLINICAL DATA:  Mid back pain. Febrile. Prior fusion from T8-L2. History of vertebral osteomyelitis. EXAM: MRI THORACIC AND LUMBAR SPINE WITHOUT CONTRAST TECHNIQUE: Multiplanar and multiecho pulse sequences of the thoracic and lumbar spine were obtained without intravenous contrast. The patient refused to continue because he got hot. He said he would return at another time for contrast. COMPARISON:  Chest x-ray dated 06/02/2019 and thoracic radiographs dated 05/23/2019 and thoracic MRI dated 09/30/2018 FINDINGS: MRI THORACIC SPINE FINDINGS Alignment: Accentuated lower thoracic kyphosis secondary to previous bone destruction at T10-11. Vertebrae: Hardware obscures some detail from T8 distally. No visible discitis or osteomyelitis or other significant acute bone abnormality. Cord: The visualized portion of the thoracic spinal cord appears normal. Hardware obscures the spinal cord at T10 extending distally. Paraspinal and other soft tissues: There is an abnormal appearance of both lungs, left worse than right. Recent chest x-ray of 06/02/2019 did not demonstrate significant abnormalities. This appearance may be artifactual. Disc levels: There are tiny disc bulges at T2-3 and T3-4 without neural impingement. The discs from C7-T1 through T6-7 are otherwise normal. No spinal or foraminal stenoses. T7-8: Tiny disc bulge to the right of midline with no neural impingement. T8-9: Negative. T9-10: Negative. T10-11: The details of the T10 and T11 vertebral bodies is obscured by hardware. The  appearance suggests that those vertebral bodies have fused with Ace secondary anterior wedge deformity due to the previous osteomyelitis. T11-12: No disc bulging or protrusion. T12-L1: No visible abnormality. MRI LUMBAR SPINE FINDINGS Segmentation:  Standard. Alignment:  2 mm retrolisthesis of L4 on L5. Vertebrae: Pedicle screws extend from T8-L2. No acute abnormality of the lumbar spine. Conus medullaris and cauda equina: Conus extends to the L1-2 level.  Conus and cauda equina appear normal. Paraspinal and other soft tissues: Negative. Disc levels: T12-L1 and L1-2: Normal disc. Pedicle screws. L2-3: Small broad-based disc bulge without neural impingement. No facet arthritis. Pedicle screws in L2. L3-4: Small broad-based disc bulge asymmetric into the left neural foramen. Moderate bilateral facet arthritis with ligamentum flavum hypertrophy creating mild spinal stenosis with slight left foraminal stenosis. However, the left L3 nerve exits without impingement. Nearing of both lateral recesses. L4-5: Small disc bulge slightly asymmetric to the right. Moderate hypertrophy and arthritis of the right facet joint. No neural impingement. L5-S1: Marked disc space narrowing with degenerative changes of the vertebral endplates. Small broad-based disc protrusion with accompanying osteophytes extending into both neural foramina without severe foraminal stenosis. No focal neural impingement. Minimal degenerative changes of the right facet joint. IMPRESSION: MR THORACIC SPINE IMPRESSION No discrete osteomyelitis or discitis in the thoracic spine. Detail is limited at the level of the previous discitis and osteomyelitis at T10-11. 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 The thoracic spine otherwise demonstrates no significant abnormalities. MR LUMBAR SPINE IMPRESSION 1. 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 2. Moderate right facet arthritis at L4-5.  Electronically Signed   By: Lorriane Shire M.D.   On: 06/05/2019 14:57   DG CHEST PORT 1 VIEW  Result Date: 06/23/2019 CLINICAL DATA:  Respiratory distress EXAM: PORTABLE CHEST 1 VIEW COMPARISON:  Chest radiograph 06/23/2019 FINDINGS: Right upper extremity PICC line tip projects over the superior vena cava. Monitoring leads overlie the patient. Stable cardiac and mediastinal contours. Similar-appearing extensive postsurgical changes involving the left hemithorax. Similar diffuse bilateral interstitial pulmonary opacities. Thoracic spinal fusion hardware. IMPRESSION: Similar bilateral interstitial pulmonary opacities. Similar extensive postsurgical changes left hemithorax. Electronically Signed   By: Lovey Newcomer M.D.   On: 06/23/2019 08:26   DG Chest Port 1 View  Result Date: 06/01/2019 CLINICAL DATA:  Cough EXAM: PORTABLE CHEST 1 VIEW COMPARISON:  06/12/2019, 06/06/2019 FINDINGS: Postsurgical changes related to left lower lobectomy and resection of multiple left-sided ribs with extensive surgical wires, many of which are chronically disrupted. Partially visualized thoracolumbar fusion hardware. Grossly stable cardiomediastinal contours. Diffuse interstitial opacities throughout both lungs, slightly progressed from prior. No pneumothorax is seen. IMPRESSION: Diffuse interstitial opacities throughout both lungs, slightly progressed from prior. Findings may reflect pulmonary edema versus atypical or viral infection. Electronically Signed   By: Davina Poke D.O.   On: 05/26/2019 17:55   DG Chest Port 1 View  Result Date: 06/02/2019 CLINICAL DATA:  Fever, shortness of breath EXAM: PORTABLE CHEST 1 VIEW COMPARISON:  11/25/2018 FINDINGS: Heart is borderline in size. Mild vascular congestion. Bilateral perihilar opacities. Left basilar opacities. Findings could reflect edema or infection. No visible significant effusions or acute bony abnormality. IMPRESSION: Borderline heart size. Vascular congestion with  bilateral perihilar and left lower lobe opacities, edema versus infection. Electronically Signed   By: Rolm Baptise M.D.   On: 06/02/2019 22:58   ECHOCARDIOGRAM COMPLETE  Result Date: 06/23/2019   ECHOCARDIOGRAM REPORT   Patient Name:   PALMER FAHRNER Sebo Date of Exam: 06/23/2019 Medical Rec #:  [id number]      Height:       72.0 in Accession #:    [id number]     Weight:       227.0 lb Date of Birth:  1961/04/10      BSA:          2.25 m Patient Age:  59 years       BP:           124/87 mmHg Patient Gender: M              HR:           102 bpm. Exam Location:  Inpatient Procedure: 2D Echo Indications:    Bacteremia 790.7 / R78.81  History:        Patient has prior history of Echocardiogram examinations, most                 recent 06/04/2019. Risk Factors:Hypertension, Dyslipidemia and                 Current Smoker. Alcohol abuse. Hepatitis C.  Sonographer:    Darlina Sicilian RDCS Referring Phys: [id number] [place]  1. Left ventricular ejection fraction, by visual estimation, is 55 to 60%. The left ventricle has normal function. There is mildly increased left ventricular hypertrophy.  2. The left ventricle has no regional wall motion abnormalities.  3. Global right ventricle has moderately reduced systolic function.The right ventricular size is mildly enlarged. Right vetricular wall thickness was not assessed.  4. Poor acoustic windows No true apical view. RV dysfunction as noted. Review of echo images from Jan 2021, RVEF appeared down in that as well.  5. The mitral valve is normal in structure. No evidence of mitral valve regurgitation.  6. The tricuspid valve is normal in structure.  7. The tricuspid valve is normal in structure. Tricuspid valve regurgitation is mild.  8. The aortic valve is tricuspid. Aortic valve regurgitation is not visualized.  9. The pulmonic valve was grossly normal. Pulmonic valve regurgitation is not visualized. 10. Aortic dilatation noted. 11. There is mild dilatation of  the aortic root measuring 43 mm. 12. Moderately elevated pulmonary artery systolic pressure. 13. The inferior vena cava is normal in size with greater than 50% respiratory variability, suggesting right atrial pressure of 3 mmHg. FINDINGS  Left Ventricle: Left ventricular ejection fraction, by visual estimation, is 55 to 60%. The left ventricle has normal function. The left ventricle has no regional wall motion abnormalities. The left ventricular internal cavity size was the left ventricle is normal in size. There is mildly increased left ventricular hypertrophy. Right Ventricle: The right ventricular size is mildly enlarged. Right vetricular wall thickness was not assessed. Global RV systolic function is has moderately reduced systolic function. The tricuspid regurgitant velocity is 3.01 m/s, and with an assumed  right atrial pressure of 8 mmHg, the estimated right ventricular systolic pressure is moderately elevated at 44.2 mmHg. Poor acoustic windows No true apical view. RV dysfunction as noted. Review of echo images from Jan 2021, RVEF appeared down in that as well. Left Atrium: Left atrial size was normal in size. Right Atrium: Right atrial size was normal in size Pericardium: There is no evidence of pericardial effusion. Mitral Valve: The mitral valve is normal in structure. No evidence of mitral valve regurgitation. Tricuspid Valve: The tricuspid valve is normal in structure. Tricuspid valve regurgitation is mild. Aortic Valve: The aortic valve is tricuspid. Aortic valve regurgitation is not visualized. Pulmonic Valve: The pulmonic valve was grossly normal. Pulmonic valve regurgitation is not visualized. Pulmonic regurgitation is not visualized. Aorta: Aortic dilatation noted. 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 Venous: The inferior vena cava is normal in size with greater than 50% respiratory variability, suggesting right atrial pressure of 3 mmHg. IAS/Shunts: No atrial level shunt  detected by color flow Doppler.  LEFT VENTRICLE PLAX 2D LVIDd:         5.00 cm  Diastology LVIDs:         3.80 cm  LV e' lateral:   4.46 cm/s LV PW:         0.70 cm  LV E/e' lateral: 16.7 LV IVS:        1.20 cm  LV e' medial:    3.26 cm/s LVOT diam:     2.50 cm  LV E/e' medial:  22.9 LV SV:         56 ml LV SV Index:   24.34 LVOT Area:     4.91 cm  LEFT ATRIUM         Index LA diam:    3.00 cm 1.33 cm/m  AORTIC VALVE LVOT Vmax:   68.00 cm/s LVOT Vmean:  44.800 cm/s LVOT VTI:    0.095 m  AORTA Ao Root diam: 4.30 cm Ao Asc diam:  4.00 cm MITRAL VALVE                        TRICUSPID VALVE MV Area (PHT): 8.82 cm             TR Peak grad:   36.2 mmHg MV PHT:        24.94 msec           TR Vmax:        313.00 cm/s MV Decel Time: 86 msec MV E velocity: 74.70 cm/s 103 cm/s  SHUNTS MV A velocity: 55.30 cm/s 70.3 cm/s Systemic VTI:  0.10 m MV E/A ratio:  1.35       1.5       Systemic Diam: 2.50 cm  Dorris Carnes MD Electronically signed by Dorris Carnes MD Signature Date/Time: 06/23/2019/4:14:31 PM    Final    ECHOCARDIOGRAM COMPLETE  Result Date: 06/04/2019   ECHOCARDIOGRAM REPORT   Patient Name:   CHRISTINE SCHIEFELBEIN Camille Date of Exam: 06/04/2019 Medical Rec #:  [id number]      Height:       72.0 in Accession #:    [id number]     Weight:       242.0 lb Date of Birth:  1960/10/04      BSA:          2.31 m Patient Age:    43 years       BP:           132/75 mmHg Patient Gender: M              HR:           95 bpm. Exam Location:  Inpatient Procedure: 2D Echo, Cardiac Doppler and Color Doppler Indications:    Bacteremia  History:        Patient has prior history of Echocardiogram examinations, most                 recent 10/10/2018. Signs/Symptoms:Bacteremia; Risk                 Factors:Hypertension, Dyslipidemia and Diabetes. Hep. C, IVDU,                 MRSA, T-spine discitis.  Sonographer:    Dustin Flock Referring Phys: [place]  1. Left ventricular ejection fraction, by visual estimation, is 55 to 60%.  The left ventricle has normal function. There is no left ventricular hypertrophy.  2. Left ventricular diastolic parameters are indeterminate.  3. The left ventricle has no regional wall motion abnormalities.  4. Global right ventricle has normal systolic function.The right ventricular size is normal. No increase in right ventricular wall thickness.  5. Left atrial size was normal.  6. Right atrial size was normal.  7. The mitral valve is normal in structure. No evidence of mitral valve regurgitation. No evidence of mitral stenosis.  8. The tricuspid valve is normal in structure.  9. The aortic valve is normal in structure. Aortic valve regurgitation is not visualized. No evidence of aortic valve sclerosis or stenosis. 10. The pulmonic valve was normal in structure. Pulmonic valve regurgitation is not visualized. 11. The inferior vena cava is normal in size with greater than 50% respiratory variability, suggesting right atrial pressure of 3 mmHg. FINDINGS  Left Ventricle: Left ventricular ejection fraction, by visual estimation, is 55 to 60%. The left ventricle has normal function. The left ventricle has no regional wall motion abnormalities. There is no left ventricular hypertrophy. Left ventricular diastolic parameters are indeterminate. Indeterminate filling pressures. Right Ventricle: The right ventricular size is normal. No increase in right ventricular wall thickness. Global RV systolic function is has normal systolic function. Left Atrium: Left atrial size was normal in size. Right Atrium: Right atrial size was normal in size Pericardium: There is no evidence of pericardial effusion. Mitral Valve: The mitral valve is normal in structure. No evidence of mitral valve regurgitation. No evidence of mitral valve stenosis by observation. Tricuspid Valve: The tricuspid valve is normal in structure. Tricuspid valve regurgitation is not demonstrated. Aortic Valve: The aortic valve is normal in structure. Aortic valve  regurgitation is not visualized. The aortic valve is structurally normal, with no evidence of sclerosis or stenosis. Pulmonic Valve: The pulmonic valve was normal in structure. Pulmonic valve regurgitation is not visualized. Pulmonic regurgitation is not visualized. Aorta: The aortic root, ascending aorta and aortic arch are all structurally normal, with no evidence of dilitation or obstruction. Venous: The inferior vena cava is normal in size with greater than 50% respiratory variability, suggesting right atrial pressure of 3 mmHg. IAS/Shunts: No atrial level shunt detected by color flow Doppler. There is no evidence of a patent foramen ovale. No ventricular septal defect is seen or detected. There is no evidence of an atrial septal defect. Additional Comments: No vegetations are seen. Consider TEE if the suspicion for endocarditis is high.  LEFT VENTRICLE PLAX 2D LVIDd:         5.21 cm  Diastology LVIDs:         3.38 cm  LV e' lateral:   7.94 cm/s LV PW:         1.09 cm  LV E/e' lateral: 8.6 LV IVS:        1.13 cm  LV e' medial:    5.22 cm/s LVOT diam:     2.50 cm  LV E/e' medial:  13.1 LV SV:         83 ml LV SV Index:   34.81 LVOT Area:     4.91 cm  RIGHT VENTRICLE RV Basal diam:  2.69 cm RV S prime:     13.20 cm/s TAPSE (M-mode): 2.9 cm LEFT ATRIUM             Index       RIGHT ATRIUM           Index LA diam:        3.50 cm 1.51 cm/m  RA Area:     14.20 cm LA Vol (A2C):   48.1 ml 20.82 ml/m RA Volume:   40.60 ml  17.57 ml/m LA Vol (A4C):   51.2 ml 22.16 ml/m LA Biplane Vol: 51.8 ml 22.42 ml/m  AORTIC VALVE LVOT Vmax:   99.80 cm/s LVOT Vmean:  65.700 cm/s LVOT VTI:    0.206 m  AORTA Ao Root diam: 3.70 cm MITRAL VALVE MV Area (PHT): 5.66 cm             SHUNTS MV PHT:        38.86 msec           Systemic VTI:  0.21 m MV Decel Time: 134 msec             Systemic Diam: 2.50 cm MV E velocity: 68.40 cm/s 103 cm/s MV A velocity: 65.50 cm/s 70.3 cm/s MV E/A ratio:  1.04       1.5  Mihai Croitoru MD  Electronically signed by Sanda Klein MD Signature Date/Time: 06/04/2019/6:09:26 PM    Final    [place] EKG SITE RITE  Result Date: 06/06/2019 If Site Rite image not attached, placement could not be confirmed due to current cardiac rhythm.   PERFORMANCE STATUS (ECOG) : 2 - Symptomatic, <50% confined to bed  Review of Systems Unless otherwise noted, a complete review of systems is negative.  Physical Exam General: NAD, frail appearing, thin Cardiovascular: regular rate and rhythm Pulmonary: Coarse anterior fields Abdomen: soft, nontender, + bowel sounds GU: no suprapubic tenderness Extremities: no edema, no joint deformities Skin: no rashes Neurological: Weakness but otherwise nonfocal  IMPRESSION: Met with patient in ICU.  He continues to have intermittent hypoxia despite being on both high flow nasal cannula as well as a nonrebreather.  Patient seems fidgety in the bed and keeps picking at his oxygen.  Patient says overall he feels better and his only complaint is that he has been unable to eat in over 24 hours.  Patient seems to have a reasonable understanding of his current medical problems.  He seems to also recognize the severity but he says he is optimistic that he will improve with treatment.  Patient is in agreement with the current scope of treatment.    We did discuss CODE STATUS.  Patient says that he would want an attempt at resuscitation and would be okay with intubation if necessary, although he says he would not want to be prolonged on a ventilator if care became futile.  He would also not want resuscitation if he were "brain dead."  Otherwise, he would want to continue full scope/full code.  Per chart, there had been some discussion earlier today about patient discharging AMA and stating that he was ready to die.  Patient admits to having unstable moods secondary to his bipolar disorder.  He shows me multiple scabs on his fist that he says were a result from him striking a  wall after he received steroids.  It might be reasonable to consult psychiatry and have them follow along, particularly if patient will require medications that might precipitate manic episodes.  Patient says that he does not have any advance directives.  He does have an advance psychiatric directives but I could not locate that in the chart.  Patient stated that he would want his sister to be his decision maker if needed.  I encouraged him to consider completing a healthcare power of attorney, particularly as he states that he would not want his daughter  to have any influence over medical decision-making.  PLAN: -Continue current scope of treatment -Full code -Consider psychiatric consult -We will follow along   Time Total: 60 minutes  Visit consisted of counseling and education dealing with the complex and emotionally intense issues of symptom management and palliative care in the setting of serious and potentially life-threatening illness.Greater than 50%  of this time was spent counseling and coordinating care related to the above assessment and plan.  Signed by: Altha Harm, PhD, NP-C

## 2019-06-23 NOTE — Progress Notes (Signed)
   Vital Signs MEWS/VS Documentation      06/04/2019 2230 06/05/2019 2245 06/09/2019 2315 06/21/2019 2355   MEWS Score:  1  1  1  4    MEWS Score Color:  Nyoka Cowden  Green  Green  Red   Resp:  (!) 23  (!) 23  (!) 24  (!) 38   Pulse:  94  92  91  (!) 102   BP:  121/72  --  119/85  --   Temp:  --  --  --  98.1 F (36.7 C)   O2 Device:  --  --  --  Bi-PAP   FiO2 (%):  --  --  --  (S) 100 %   Level of Consciousness:  --  --  Alert  --           Max Fickle 06/23/2019,12:33 AM

## 2019-06-23 NOTE — Progress Notes (Signed)
Patient ID: Howard Fox, male   DOB: [birth year], 59 y.o.   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  Patient was reportedly found by his mother in the back cyanotic and tachypneic.  He was found to have leukocytosis, procalcitonin of 0.33 with respiratory showing diffuse interstitial opacities throughout both lungs suggestive of pulmonary edema versus atypical or viral infection.  He tested negative for influenza and Covid.  Started on broad-spectrum antibiotics and IV fluids.  Assessment & Plan:   Acute on chronic hypoxic respiratory failure Possible healthcare associate pneumonia with gram-negative pneumonia versus MRSA pneumonia COPD exacerbation Question of pulmonary edema -Patient was recently discharged on 3 L oxygen via nasal cannula.  Presented very hypoxic and required nonrebreather/BiPAP.  Currently on 15 L high flow nasal cannula but extremely uncomfortable and gasping.  Repeat ABG done this morning shows PO2 in the 40s. -Increase Lasix to 80 mg IV every 12 hours and Solu-Medrol to 60 mg IV every 8 hours.  Continue broad-spectrum antibiotics for now. -Repeat chest x-ray done this morning shows interstitial  opacities. -Rapid Covid testing was negative on presentation along with influenza.  Will check Covid and affect.  Put the patient on isolation. -Patient's x-ray status continued to worsen and he was extremely tachypneic.  PCCM was consulted and patient is about to be intubated.  He will be transferred to ICU. -We will also get an echo given recent MRSA bacteremia -We will also check COVID-19 inflammatory markers  Leukocytosis -We will repeat stat labs  Acute metabolic encephalopathy -Probably from hypoxia.  Currently mentating well.  Patient MRSA bacteremia -Patient was supposed to be on IV vancomycin till 07/17/2019.  Continue vancomycin.  Repeat blood cultures are pending.  TEE could not be done during last hospitalization because of hypoxia.  Chronic pain -Continue pain management.  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  Use IV Ativan for now for anxiety  Obesity -Outpatient follow-up   DVT prophylaxis: Lovenox Code Status: Full Family Communication: None at bedside Disposition Plan: Depends on clinical outcome  Consultants: PCCM  Procedures: None  Antimicrobials: Vancomycin and cefepime from 06/01/2019 onwards   Subjective: Patient seen and examined at bedside.  He is gasping and hardly able to complete sentences.  Looks extremely anxious.  No overnight fever or vomiting.  Nursing staff reports that the oxygen saturations have been in the 70s and 80s.  Objective: Vitals:   06/23/19 0613 06/23/19 0623 06/23/19 0716 06/23/19 0718  BP:   116/87   Pulse: 85 90 87   Resp: (!) 31 (!) 28 (!) 25   Temp:   98.4 F (36.9 C)   TempSrc:   Oral   SpO2:  Marland Kitchen)  77% (!) 88% (!) 88%  Weight:      Height:        Intake/Output Summary (Last 24 hours) at 06/23/2019 0844 Last data filed at 06/23/2019  0226 Gross per 24 hour  Intake --  Output 300 ml  Net -300 ml   Filed Weights   06/12/2019 1727  Weight: 103 kg    Examination:  General exam: Appears in extreme distress. Respiratory system: Bilateral decreased breath sounds at bases with scattered crackles.  Extremely tachypneic.  Using accessory muscles. Cardiovascular system: S1 & S2 heard, Rate controlled Gastrointestinal system: Abdomen is nondistended, soft and nontender. Normal bowel sounds heard. Extremities: No cyanosis, clubbing; trace lower extremity edema Central nervous system: Awake and alert. No focal neurological deficits. Moving extremities Skin: No rashes, lesions or ulcers Psychiatry: Looks extremely anxious    Data Reviewed: I have personally reviewed following labs and imaging studies  CBC: Recent Labs  Lab 06/10/2019 1730 05/27/2019 1737  WBC  --  29.4*  NEUTROABS  --  28.2*  HGB 13.9 13.7  HCT 41.0 42.3  MCV  --  88.3  PLT  --  [id number]*   Basic Metabolic Panel: Recent Labs  Lab 06/07/2019 1730 06/10/2019 1737  NA 136 138  K 3.8 3.9  CL  --  101  CO2  --  25  GLUCOSE  --  147*  BUN  --  18  CREATININE  --  0.98  CALCIUM  --  8.9   GFR: Estimated Creatinine Clearance: 102 mL/min (by C-G formula based on SCr of 0.98 mg/dL). Liver Function Tests: Recent Labs  Lab 06/03/2019 1737  AST 38  ALT 61*  ALKPHOS 126  BILITOT 0.3  PROT 6.8  ALBUMIN 2.3*   No results for input(s): LIPASE, AMYLASE in the last 168 hours. No results for input(s): AMMONIA in the last 168 hours. Coagulation Profile: Recent Labs  Lab 06/18/2019 1737  INR 1.2   Cardiac Enzymes: No results for input(s): CKTOTAL, CKMB, CKMBINDEX, TROPONINI in the last 168 hours. BNP (last 3 results) No results for input(s): PROBNP in the last 8760 hours. HbA1C: No results for input(s): HGBA1C in the last 72 hours. CBG: No results for input(s): GLUCAP in the last 168 hours. Lipid Profile: No results for input(s): CHOL, HDL, LDLCALC,  TRIG, CHOLHDL, LDLDIRECT in the last 72 hours. Thyroid Function Tests: No results for input(s): TSH, T4TOTAL, FREET4, T3FREE, THYROIDAB in the last 72 hours. Anemia Panel: No results for input(s): VITAMINB12, FOLATE, FERRITIN, TIBC, IRON, RETICCTPCT in the last 72 hours. Sepsis Labs: Recent Labs  Lab 06/12/2019 1737 06/06/2019 1951  PROCALCITON 0.33  --   LATICACIDVEN 2.9* 1.2    Recent Results (from the past 240 hour(s))  Respiratory Panel by RT PCR (Flu A&B, Covid) - Nasopharyngeal Swab     Status: None   Collection Time: 06/09/2019  5:37 PM   Specimen: Nasopharyngeal Swab  Result Value Ref Range Status   SARS Coronavirus 2 by RT PCR NEGATIVE NEGATIVE Final    Comment: (NOTE) SARS-CoV-2 target nucleic acids are NOT DETECTED. The SARS-CoV-2 RNA is generally detectable in upper respiratoy specimens during the acute phase of infection. The lowest concentration of SARS-CoV-2 viral copies this assay can detect is 131 copies/mL. A negative result does not preclude SARS-Cov-2 infection and should not be used as the sole basis for treatment or other patient management decisions. A negative result may occur with  improper specimen collection/handling, submission of specimen other than nasopharyngeal swab, presence of  viral mutation(s) within the areas targeted by this assay, and inadequate number of viral copies (<131 copies/mL). A negative result must be combined with clinical observations, patient history, and epidemiological information. The expected result is Negative. Fact Sheet for Patients:  PinkCheek.be Fact Sheet for Healthcare Providers:  GravelBags.it This test is not yet ap proved or cleared by the [place] FDA and  has been authorized for detection and/or diagnosis of SARS-CoV-2 by FDA under an Emergency Use Authorization (EUA). This EUA will remain  in effect (meaning this test can be used) for the duration of  the COVID-19 declaration under Section 564(b)(1) of the Act, 21 U.S.C. section 360bbb-3(b)(1), unless the authorization is terminated or revoked sooner.    Influenza A by PCR NEGATIVE NEGATIVE Final   Influenza B by PCR NEGATIVE NEGATIVE Final    Comment: (NOTE) The Xpert Xpress SARS-CoV-2/FLU/RSV assay is intended as an aid in  the diagnosis of influenza from Nasopharyngeal swab specimens and  should not be used as a sole basis for treatment. Nasal washings and  aspirates are unacceptable for Xpert Xpress SARS-CoV-2/FLU/RSV  testing. Fact Sheet for Patients: PinkCheek.be Fact Sheet for Healthcare Providers: GravelBags.it This test is not yet approved or cleared by the [place] FDA and  has been authorized for detection and/or diagnosis of SARS-CoV-2 by  FDA under an Emergency Use Authorization (EUA). This EUA will remain  in effect (meaning this test can be used) for the duration of the  Covid-19 declaration under Section 564(b)(1) of the Act, 21  U.S.C. section 360bbb-3(b)(1), unless the authorization is  terminated or revoked. Performed at [facility], [address], [place], [place] [address]          Radiology Studies: DG CHEST PORT 1 VIEW  Result Date: 06/23/2019 CLINICAL DATA:  Respiratory distress EXAM: PORTABLE CHEST 1 VIEW COMPARISON:  Chest radiograph 06/08/2019 FINDINGS: Right upper extremity PICC line tip projects over the superior vena cava. Monitoring leads overlie the patient. Stable cardiac and mediastinal contours. Similar-appearing extensive postsurgical changes involving the left hemithorax. Similar diffuse bilateral interstitial pulmonary opacities. Thoracic spinal fusion hardware. IMPRESSION: Similar bilateral interstitial pulmonary opacities. Similar extensive postsurgical changes left hemithorax. Electronically Signed   By: Lovey Newcomer M.D.   On: 06/23/2019 08:26   DG Chest Port 1  View  Result Date: 06/15/2019 CLINICAL DATA:  Cough EXAM: PORTABLE CHEST 1 VIEW COMPARISON:  06/12/2019, 06/06/2019 FINDINGS: Postsurgical changes related to left lower lobectomy and resection of multiple left-sided ribs with extensive surgical wires, many of which are chronically disrupted. Partially visualized thoracolumbar fusion hardware. Grossly stable cardiomediastinal contours. Diffuse interstitial opacities throughout both lungs, slightly progressed from prior. No pneumothorax is seen. IMPRESSION: Diffuse interstitial opacities throughout both lungs, slightly progressed from prior. Findings may reflect pulmonary edema versus atypical or viral infection. Electronically Signed   By: Davina Poke D.O.   On: 06/23/2019 17:55        Scheduled Meds: . amitriptyline  150 mg Oral QHS  . aspirin EC  325 mg Oral Daily  . budesonide (PULMICORT) nebulizer solution  0.5 mg Nebulization BID  . citalopram  20 mg Oral Q lunch  . enoxaparin (LOVENOX) injection  40 mg Subcutaneous Q24H  . furosemide  80 mg Intravenous BID  . gabapentin  300 mg Oral Daily  . ipratropium-albuterol  3 mL Nebulization Q4H  . methylPREDNISolone (SOLU-MEDROL) injection  60 mg Intravenous Q8H  . potassium chloride  10 mEq Oral Daily  . rosuvastatin  40 mg  Oral Daily   Continuous Infusions: . ceFEPime (MAXIPIME) IV 2 g (06/23/19 0226)  . vancomycin Stopped (06/23/19 0125)          Aline August, MD [facility] 06/23/2019, 8:44 AM

## 2019-06-23 NOTE — Evaluation (Signed)
Clinical/Bedside Swallow Evaluation Patient Details  Name: Howard Fox MRN: [id number] Date of Birth: Dec 03, 1960  Today's Date: 06/23/2019 Time: SLP Start Time (ACUTE ONLY): 1055 SLP Stop Time (ACUTE ONLY): 1111 SLP Time Calculation (min) (ACUTE ONLY): 16 min  Past Medical History:  Past Medical History:  Diagnosis Date  . Anxiety 10/25/2018  . Arthritis   . Bipolar 1 disorder ([place])   . Depression   . Fracture, ribs 1993   ruptured stomach, left lung resulted from industrial fall  . Heart murmur    as a child  . Hepatitis C   . History of kidney stones   . Hypertension   . Kidney stones   . Low blood pressure 02/20/2019  . MRSA bacteremia 10/09/2018  . S/P lumbar fusion 02/01/2019  . Vertebral osteomyelitis ([place]) 10/25/2018   Past Surgical History:  Past Surgical History:  Procedure Laterality Date  . CHOLECYSTECTOMY    . LAMINECTOMY WITH POSTERIOR LATERAL ARTHRODESIS LEVEL 4 N/A 02/01/2019   Procedure: Laminectomy and Foraminotomy - Thoracic ten-Thoracic eleven, Instrumented fusion Thoracic eight-Lumbar two;  Surgeon: Eustace Moore, MD;  Location: [facility];  Service: Neurosurgery;  Laterality: N/A;  . laser surgery for kidney stones Left   . LITHOTRIPSY     HPI:  Howard Fox is a 59 y.o. 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     Assessment / Plan / Recommendation Clinical Impression  Pt was seen for a bedside swallow evaluation and he presents with mild oral dysphagia secondary to dentition.  Observed missing dentition on the bottom and edentulism on top.  Pt reported that his top dentures are either at home or they are missing.  Pt consumed trials of ice chips, thin liquid, puree, and regular solids.  He  was observed to be impulsive, taking large bites/sips, and he benefited from verbal cues to slow rate of intake and to limit bolus size.  Mastication of regular solids was mildly prolonged but effective and pt was able to clear trace oral residue with a liquid wash.  No clinical s/sx of aspiration were observed with any trials including during [employer] and vitals remained stable throughout PO trials.  Recommend initiation of Dysphagia 2 (fine chop) solids and thin liquids when MD deems appropriate.  Pt will benefit from intermittent supervision during meals to cue for compensatory strategies (listed below).  Spoke with RN regarding all recommendations.  SLP will f/u to monitor diet tolerance per POC.  SLP Visit Diagnosis: Dysphagia, oral phase (R13.11)    Aspiration Risk  Mild aspiration risk    Diet Recommendation Dysphagia 2 (Fine chop);Thin liquid   Liquid Administration via: Cup;Straw Medication Administration: Whole meds with puree Supervision: Patient able to self feed Compensations: Minimize environmental distractions;Slow rate;Small sips/bites Postural Changes: Seated upright at 90 degrees    Other  Recommendations Oral Care Recommendations: Oral care BID   Follow up Recommendations (TBD)      Frequency and Duration min 2x/week  2 weeks       Prognosis Prognosis for Safe Diet Advancement: Good      Swallow Study   General HPI: Howard Fox is a 59 y.o. male with medical history significant of HTN, HLD, diabetes, chronic pain syndrome, remote history of IVDU, history of vertebral  osteomyelitis on chronic suppressive doxycycline recently switched to IV vancomycin for MRSA bacteremia , depression, hep C s/p treatment who presented with acute respiratory distress.   Type of Study: Bedside Swallow Evaluation Previous Swallow Assessment: None documented  Diet Prior to this Study: NPO Temperature Spikes Noted: No Respiratory Status: Nasal cannula History of  Recent Intubation: No Behavior/Cognition: Alert;Cooperative;Pleasant mood;Impulsive Oral Cavity Assessment: Within Functional Limits Oral Care Completed by SLP: No Oral Cavity - Dentition: Missing dentition(edentulous on top- dentures at home or missing ) Vision: Functional for self-feeding Self-Feeding Abilities: Able to feed self Patient Positioning: Upright in bed Baseline Vocal Quality: Normal Volitional Swallow: Able to elicit    Oral/Motor/Sensory Function Overall Oral Motor/Sensory Function: Within functional limits   Ice Chips Ice chips: Within functional limits Presentation: Spoon   Thin Liquid Thin Liquid: Within functional limits Presentation: Cup;Straw    Nectar Thick Nectar Thick Liquid: Not tested   Honey Thick Honey Thick Liquid: Not tested   Puree Puree: Within functional limits Presentation: Spoon   Solid     Solid: Impaired Presentation: Self Fed Oral Phase Impairments: Impaired mastication Oral Phase Functional Implications: Prolonged oral transit;Oral residue;Impaired mastication     Howard Fox M.S., [id number] Acute Rehabilitation Services Office: [phone]  Howard Fox 06/23/2019,11:36 AM

## 2019-06-23 NOTE — Plan of Care (Signed)
  Problem: Education: Goal: Knowledge of General Education information will improve Description: Including pain rating scale, medication(s)/side effects and non-pharmacologic comfort measures Outcome: Progressing   Problem: Health Behavior/Discharge Planning: Goal: Ability to manage health-related needs will improve Outcome: Progressing   Problem: Clinical Measurements: Goal: Will remain free from infection Outcome: Progressing Goal: Diagnostic test results will improve Outcome: Progressing Goal: Respiratory complications will improve Outcome: Progressing   Problem: Coping: Goal: Level of anxiety will decrease Outcome: Progressing   Problem: Pain Managment: Goal: General experience of comfort will improve Outcome: Progressing   Problem: Safety: Goal: Ability to remain free from injury will improve Outcome: Progressing

## 2019-06-24 ENCOUNTER — Inpatient Hospital Stay (HOSPITAL_COMMUNITY): Payer: Medicare Other

## 2019-06-24 DIAGNOSIS — B9562 Methicillin resistant Staphylococcus aureus infection as the cause of diseases classified elsewhere: Secondary | ICD-10-CM

## 2019-06-24 DIAGNOSIS — R7881 Bacteremia: Secondary | ICD-10-CM

## 2019-06-24 DIAGNOSIS — Z95828 Presence of other vascular implants and grafts: Secondary | ICD-10-CM

## 2019-06-24 DIAGNOSIS — F1990 Other psychoactive substance use, unspecified, uncomplicated: Secondary | ICD-10-CM

## 2019-06-24 DIAGNOSIS — R0902 Hypoxemia: Secondary | ICD-10-CM

## 2019-06-24 DIAGNOSIS — Z7189 Other specified counseling: Secondary | ICD-10-CM

## 2019-06-24 DIAGNOSIS — Z8739 Personal history of other diseases of the musculoskeletal system and connective tissue: Secondary | ICD-10-CM

## 2019-06-24 DIAGNOSIS — F3112 Bipolar disorder, current episode manic without psychotic features, moderate: Secondary | ICD-10-CM

## 2019-06-24 DIAGNOSIS — R0603 Acute respiratory distress: Secondary | ICD-10-CM

## 2019-06-24 DIAGNOSIS — B182 Chronic viral hepatitis C: Secondary | ICD-10-CM

## 2019-06-24 DIAGNOSIS — Z8619 Personal history of other infectious and parasitic diseases: Secondary | ICD-10-CM

## 2019-06-24 DIAGNOSIS — R454 Irritability and anger: Secondary | ICD-10-CM

## 2019-06-24 LAB — BASIC METABOLIC PANEL
Anion gap: 11 (ref 5–15)
BUN: 25 mg/dL — ABNORMAL HIGH (ref 6–20)
CO2: 28 mmol/L (ref 22–32)
Calcium: 9.2 mg/dL (ref 8.9–10.3)
Chloride: 99 mmol/L (ref 98–111)
Creatinine, Ser: 0.71 mg/dL (ref 0.61–1.24)
GFR calc Af Amer: >60 mL/min (ref >60)
GFR calc non Af Amer: >60 mL/min (ref >60)
Glucose, Bld: 137 mg/dL — ABNORMAL HIGH (ref 70–99)
Potassium: 4 mmol/L (ref 3.5–5.1)
Sodium: 138 mmol/L (ref 135–145)

## 2019-06-24 LAB — CBC WITH DIFFERENTIAL/PLATELET
Abs Immature Granulocytes: 0.14 10*3/uL — ABNORMAL HIGH (ref 0.00–0.07)
Basophils Absolute: 0 10*3/uL (ref 0.0–0.1)
Basophils Relative: 0 %
Eosinophils Absolute: 0 10*3/uL (ref 0.0–0.5)
Eosinophils Relative: 0 %
HCT: 38.4 % — ABNORMAL LOW (ref 39.0–52.0)
Hemoglobin: 12.3 g/dL — ABNORMAL LOW (ref 13.0–17.0)
Immature Granulocytes: 1 %
Lymphocytes Relative: 5 %
Lymphs Abs: 1 10*3/uL (ref 0.7–4.0)
MCH: 28.2 pg (ref 26.0–34.0)
MCHC: 32 g/dL (ref 30.0–36.0)
MCV: 88.1 fL (ref 80.0–100.0)
Monocytes Absolute: 0.6 10*3/uL (ref 0.1–1.0)
Monocytes Relative: 3 %
Neutro Abs: 18.7 10*3/uL — ABNORMAL HIGH (ref 1.7–7.7)
Neutrophils Relative %: 91 %
Platelets: 323 10*3/uL (ref 150–400)
RBC: 4.36 MIL/uL (ref 4.22–5.81)
RDW: 14.6 % (ref 11.5–15.5)
WBC: 20.4 10*3/uL — ABNORMAL HIGH (ref 4.0–10.5)
nRBC: 0 % (ref 0.0–0.2)

## 2019-06-24 LAB — SAR COV2 SEROLOGY (COVID19)AB(IGG),IA: SARS-CoV-2 Ab, IgG: NONREACTIVE

## 2019-06-24 LAB — GLUCOSE, CAPILLARY: Glucose-Capillary: 131 mg/dL — ABNORMAL HIGH (ref 70–99)

## 2019-06-24 LAB — PROCALCITONIN: Procalcitonin: 0.95 ng/mL

## 2019-06-24 MED ORDER — PANTOPRAZOLE SODIUM 40 MG PO TBEC
40.0000 mg | DELAYED_RELEASE_TABLET | Freq: Every day | ORAL | Status: DC
  Administered 2019-06-25 – 2019-06-27 (×3): 40 mg via ORAL
  Filled 2019-06-24 (×3): qty 1

## 2019-06-24 MED ORDER — METHYLPREDNISOLONE SODIUM SUCC 125 MG IJ SOLR: 60.0000 mg | Freq: Two times a day (BID) | INTRAMUSCULAR | Status: DC

## 2019-06-24 MED ORDER — PANTOPRAZOLE SODIUM 40 MG IV SOLR
40.0000 mg | INTRAVENOUS | Status: DC
  Administered 2019-06-24: 40 mg via INTRAVENOUS
  Filled 2019-06-24: qty 40

## 2019-06-24 MED ORDER — METHYLPREDNISOLONE SODIUM SUCC 125 MG IJ SOLR: 60.0000 mg | Freq: Every day | INTRAMUSCULAR | Status: DC

## 2019-06-24 MED ORDER — ALPRAZOLAM 0.5 MG PO TABS
0.5000 mg | ORAL_TABLET | Freq: Three times a day (TID) | ORAL | Status: DC | PRN
  Administered 2019-06-24 – 2019-06-27 (×7): 0.5 mg via ORAL
  Filled 2019-06-24 (×7): qty 1

## 2019-06-24 NOTE — Progress Notes (Signed)
  Speech Language Pathology Treatment: Dysphagia  Patient Details Name: Howard Fox MRN: [id number] DOB: 1960/11/23 Today's Date: 06/24/2019 Time: 1025-1039 SLP Time Calculation (min) (ACUTE ONLY): 14 min  Assessment / Plan / Recommendation Clinical Impression  Howard Fox is irritable this morning with many complaints about situation. Therapist provided encouragement as well as redirection when appropriate to goals of swallow and obtaining most efficient and safest po texture. He affirms eating with dentures prior to admission. No observable delays or difficulty with solids orally. He is impulsive per RN and evaluating therapist during intake and therapist controlled straw sip size allowing 2-3 consecutive sips. No s/s aspiration. Discussed upgrading to having only meats that are chopped on tray however he stated he did "not mind staying on chopped food; give me more gravy." With respiratory compromise, agree with remaining on Dys 2. Limit for SpO2 is 85 > which he fluctuated 86-low 90's. Decreases when he verbalizes for 60 seconds or greater. Continue Dys 2( chopped texture), thin, small sips and slow rate, remain quiet. Will continue to follow.    HPI HPI: Howard Fox is a 59 y.o. 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        SLP Plan  Continue with current plan of care       Recommendations  Diet recommendations: Dysphagia 2 (fine chop);Thin liquid(pt request ) Liquids provided via: Cup;Straw Medication Administration: Whole meds with puree Supervision: Patient able to self feed;Full supervision/cueing for compensatory strategies Compensations: Slow rate;Small sips/bites;Minimize environmental distractions Postural  Changes and/or Swallow Maneuvers: Seated upright 90 degrees                Oral Care Recommendations: Oral care BID Follow up Recommendations: (TBD) SLP Visit Diagnosis: Dysphagia, oral phase (R13.11) Plan: Continue with current plan of care                       Howard Fox 06/24/2019, 10:49 AM  Howard Fox.Ed Risk analyst [phone] Office [phone]

## 2019-06-24 NOTE — Progress Notes (Signed)
NAME:  Howard Fox, MRN:  [id number], DOB:  July 05, 1960, LOS: 2 ADMISSION DATE:  06/05/2019, CONSULTATION DATE:  06/23/2019 REFERRING MD:  [facility] hospitalist team, CHIEF COMPLAINT:  Respiratory failure  Brief History   59 yo gentleman with history of emphysema, HTN, HLD, DM2 chronic pain and history of IVDU with vertebral osteomyelitis on suppressive chronic doxycycline. Recently discharged on 1/20 for MRSA bacteremia and respiratory failure. Was discharged on 3LNC and vancomycin with prednisone taper as well as lasix BID.Re-presented on 1/30 after found hypoxic in bathtub at home. Mother called EMS. Was admitted to [facility] and then progressively more hypoxemic and transferred to ICU.   Consults:  PCCM  Procedures:   Significant Diagnostic Tests:  Echo 1/31 1. Left ventricular ejection fraction, by visual estimation, is 55 to  60%. The left ventricle has normal function. There is mildly increased  left ventricular hypertrophy.  2. The left ventricle has no regional wall motion abnormalities.  3. Global right ventricle has moderately reduced systolic function.The  right ventricular size is mildly enlarged. Right vetricular wall thickness  was not assessed.  4. Poor acoustic windows No true apical view. RV dysfunction as noted.  Review of echo images from Jan 2021, RVEF appeared down in that as well.  5. The mitral valve is normal in structure. No evidence of mitral valve  regurgitation.  6. The tricuspid valve is normal in structure.  7. The tricuspid valve is normal in structure. Tricuspid valve  regurgitation is mild.  8. The aortic valve is tricuspid. Aortic valve regurgitation is not  visualized.  9. The pulmonic valve was grossly normal. Pulmonic valve regurgitation is  not visualized.  10. Aortic dilatation noted.  11. 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  12. Moderately elevated pulmonary artery systolic pressure.  13. The inferior vena cava is  normal in size with greater than 50%  respiratory variability, suggesting right atrial pressure of 3 mmHg.   Micro Data:  Covid PCR 1/30 > negative Blood culture 1/30 > COVID PCR 1/31 >negative  Antimicrobials:  Cefepime 1/30 > Vancomycin 1/30 >  Interim history/subjective:  This morning Oxygen saturations a little better and he feels clinically better.   Objective   Blood pressure (!) 141/93, pulse 98, temperature 97.6 F (36.4 C), temperature source Oral, resp. rate (!) 32, height 6' (1.829 m), weight 103 kg, SpO2 90 %.    FiO2 (%):  [70 %-100 %] 100 %   Intake/Output Summary (Last 24 hours) at 06/24/2019 0840 Last data filed at 06/24/2019 [id number] Gross per 24 hour  Intake 1218.07 ml  Output 3550 ml  Net -2331.93 ml   Filed Weights   06/19/2019 1727  Weight: 103 kg    Examination: General: Resting comfortably and able to complete full sentences, although on heated high flow and nonrebreather he is satting 98%. HENT: No scleral icterus Lungs: Clear to auscultation bilaterally, no wheezes or crackles, he has a few inspiratory squeaking noises Cardiovascular: Regular rate and rhythm Abdomen: Scaphoid, soft Extremities: No edema Neuro: AOx3, moves all 4 extremities, normal speech MSK: no rashes Lines: right PICC line  Assessment & Plan:  Acute hypoxic on chronic hypoxic respiratory failure -History of postsurgical changes related to left lower lobectomy and resection of multiple left-sided ribs with extensive surgical wires from trauma -Likely secondary to HCAP. COVID negative x2 -As above patient was recently hospitalized at this facility for similar presentation diagnosed at that time with MRSA bacteremia.  P: Continue supplemental  oxygen sats greater than 90% BNP was also elevated and he feels improved with diuresis. Head of bed elevated 30 degrees. Continue vancomycin Provide both scheduled and p.r.n. Bronchodilators  Taper steroids, continue empiric HAP coverage with  disseminated MRSA treatment. Repeat Covid PCR negative x2. HIV nonreactive.  CT Chest pending to evaluate for cavitary lesions although none were noted on CT Chest from earlier this year.   Recent MRSA bacteremia with history of T-spine discitis and fusion -ID consulted on prior admission and recommended 6 weeks of IV vancomycin end date 07/17/2019 -Patient did not receive TEE prior to admission due to profound hypoxia and later development of significant anxiety -Patient follows infectious disease at baseline with most recent recommendation of doxycycline x1 year, it appears patient self discontinued antibiotics prior to first admission P: Continue IV cefepime and vancomycin Consider TEE this admission ID reconsulted,  HX of Hepatitis C -Follows infectious disease, has remote history of IV drug use P: Supportive care  - s/p treatment  Chronic pain  -Home medications include oxycodone, gabapentin, and muscle relaxants  P; Continue home medications Close monitoring of mentation  May need to decrease home meds if Precedex is needed    Bipolar disease  -Home medications include Celexa P: Continue home medications   Alcohol abuse  HX of IVDU -Patient reports daily consumption of alcohol  -Reports no IVDU over the last 3 yrs P: Monitor CIWA  Precedex for agitation prn.  Thiamine. Multivitamin and folic acid replacement     HLD P: Continue statin   Best practice:  Diet: dysphagia 2 diet thin liquids Pain/Anxiety/Delirium protocol (if indicated): CIWA, precedex. VAP protocol (if indicated): n/a DVT prophylaxis: lovenox GI prophylaxis: n/a, but is on PPI, will switch to daily for steroid use Glucose control: serum BG<180  Foley no foley, uses urinal.  Mobility: OOB with assist Code Status: Full Code Family Communication: mother he lives at home with, daughter estranged.  Disposition: needs ICU for now given degree of hypoxemia,    Labs   CBC: Recent Labs  Lab  05/31/2019 1730 06/21/2019 1737 06/23/19 0952 06/24/19 0400  WBC  --  29.4* 19.8* 20.4*  NEUTROABS  --  28.2* 18.2* 18.7*  HGB 13.9 13.7 13.1 12.3*  HCT 41.0 42.3 40.4 38.4*  MCV  --  88.3 87.4 88.1  PLT  --  404* 374 [id number]    Basic Metabolic Panel: Recent Labs  Lab 06/21/2019 1730 06/08/2019 1737 06/23/19 0952 06/24/19 0400  NA 136 138 141 138  K 3.8 3.9 4.1 4.0  CL  --  101 103 99  CO2  --  25 25 28   GLUCOSE  --  147* 114* 137*  BUN  --  18 23* 25*  CREATININE  --  0.98 0.78 0.71  CALCIUM  --  8.9 9.1 9.2  MG  --   --  1.9  --    GFR: Estimated Creatinine Clearance: 125 mL/min (by C-G formula based on SCr of 0.71 mg/dL). Recent Labs  Lab 06/19/2019 1737 05/29/2019 1951 06/23/19 0952 06/24/19 0400  PROCALCITON 0.33  --  1.58 0.95  WBC 29.4*  --  19.8* 20.4*  LATICACIDVEN 2.9* 1.2  --   --     Liver Function Tests: Recent Labs  Lab 06/15/2019 1737 06/23/19 0952  AST 38 36  ALT 61* 56*  ALKPHOS 126 122  BILITOT 0.3 0.7  PROT 6.8 6.9  ALBUMIN 2.3* 2.3*   No results for input(s): LIPASE, AMYLASE in the last 168  hours. No results for input(s): AMMONIA in the last 168 hours.  ABG    Component Value Date/Time   PHART 7.473 (H) 06/23/2019 0810   PCO2ART 35.6 06/23/2019 0810   PO2ART 44.5 (L) 06/23/2019 0810   HCO3 25.8 06/23/2019 0810   TCO2 27 06/11/2019 1730   O2SAT 80.9 06/23/2019 0810     Coagulation Profile: Recent Labs  Lab 06/05/2019 1737  INR 1.2    Cardiac Enzymes: No results for input(s): CKTOTAL, CKMB, CKMBINDEX, TROPONINI in the last 168 hours.  HbA1C: Hgb A1c MFr Bld  Date/Time Value Ref Range Status  10/25/2018 10:10 AM 5.6 <5.7 % of total Hgb Final    Comment:    For the purpose of screening for the presence of diabetes: . <5.7%       Consistent with the absence of diabetes 5.7-6.4%    Consistent with increased risk for diabetes             (prediabetes) > or =6.5%  Consistent with diabetes . This assay result is consistent with a  decreased risk of diabetes. . Currently, no consensus exists regarding use of hemoglobin A1c for diagnosis of diabetes in children. . According to American Diabetes Association (ADA) guidelines, hemoglobin A1c <7.0% represents optimal control in non-pregnant diabetic patients. Different metrics may apply to specific patient populations.  Standards of Medical Care in Diabetes(ADA). Marland Kitchen   10/08/2018 05:17 AM 5.7 (H) 4.8 - 5.6 % Final    Comment:    (NOTE) Pre diabetes:          5.7%-6.4% Diabetes:              >6.4% Glycemic control for   <7.0% adults with diabetes     CBG: No results for input(s): GLUCAP in the last 168 hours.  Critical care time:   The patient is critically ill with multiple organ systems failure and requires high complexity decision making for assessment and support, frequent evaluation and titration of therapies, application of advanced monitoring technologies and extensive interpretation of multiple databases.   Critical Care Time devoted to patient care services described in this note is 45 minutes. This time reflects the time of my personal involvement. This critical care time does not reflect separately billable procedures or procedure time, teaching time or supervisory time of PA/NP/Med student/Med Resident etc but could involve care discussion time.  Leone [facility] 06/24/2019 8:40 AM  Pager: [phone] After hours pager: [phone]

## 2019-06-24 NOTE — Progress Notes (Signed)
[facility] Progress Note Patient Name: JAQWAN MICUCCI DOB: 24-Dec-1960 MRN: [id number]   Date of Service  06/24/2019  HPI/Events of Note  AM labs requested   eICU Interventions  Order placed      Intervention Category Minor Interventions: Clinical assessment - ordering diagnostic tests  Margaretmary Lombard 06/24/2019, 4:39 AM

## 2019-06-24 NOTE — Progress Notes (Signed)
      INFECTIOUS DISEASE ATTENDING ADDENDUM:   Date: 06/24/2019  Patient name: Howard Fox  Medical record number: [id number]  Date of birth: 1961/01/18   I examined Howard Fox today at the bedside he was on high flow oxygen via facemask but very irritable.  He was frequently pulling off his mask and yelling.  When I tried to listen his heart and lungs with the dedicated stethoscope could not hear any murmurs gallops or rubs.  I have known Howard Fox and taken care of him during prior hospitalizations and as an outpatient having treated him for discitis and also chronic hepatitis C without hepatic coma.  He does have a history of intravenous drug use but claims that he only injected 1 time  This is certainly very hard to believe in particular given that he has acquired hepatitis C and that he has had recurrent bacteremias.  He was being treated at home with IV vancomycin under the care of my partner Dr. Baxter Flattery.  His PICC line does not appear clean and intact.  He has been admitted for respiratory distress and hypoxia and placed on vancomycin and cefepime.  He is ADAMANT THAT I NOT BE INVOLVED IN HIS CARE IN ANY WAY and appears to blame me for his recurrent bacteremias though I did try to have him on years of oral antibiotics in form of BID  doxycycline to which his most recent isolate was sensitive.   I certainly never have trusted his  Story re IV drug use but I do not see evidence of problems with PICC not have I heard of suspicion by home health  However given his strong feelings and desire and reportedly of his mother that Dr. Tommy Medal not have anything to do with my care anymore I am removing myself from the case.  Therefore please call the on call ID MD for further recommendations for his care.      Rhina Brackett [place] 06/24/2019, 1:05 PM

## 2019-06-24 NOTE — Progress Notes (Signed)
Patient ID: Howard Fox, male   DOB: Feb 16, 1961, 59 y.o.   MRN: [id number]  This NP visited patient at the bedside as a follow up to  [place], for palliative medicine needs and emotional support.    Attempted to have conversation regarding current medical situation, diagnosis, prognosis, goals of care, end-of-life wishes, disposition and options.  Currently patient is requiring high flow nasal cannula and a facemask to maintain adequate O2 sats.  Patient is agitated and is verbally aggressive at times with [place] myself and staff.  He rants on and on about how he was not cared for in the past and that "my lawyers will take care of that"  I worry that he has little insight into the seriousness of his current medical situation.  He threatens to leave [facility].  "I can take care of myself at home I have all the equipment and everything I need"    He tells me that he does not need the oxygen that he is currently receiving "I am just fine"  Patient refuses to have conversation regarding advanced directives or healthcare power of attorney.  I offered to call his family, he declines at this time.  Discussed with patient the importance of continued conversation with his family and the  medical providers regarding overall plan of care and treatment options,  ensuring decisions are within the context of the patients values and GOCs.  Questions and concerns addressed   Discussed with Dr Shearon Stalls  Total time spent on the unit was 35 minutes  Greater than 50% of the time was spent in counseling and coordination of care  Wadie Lessen NP  Palliative Medicine Team Team Phone # [phone] Pager [phone]

## 2019-06-24 DEATH — deceased

## 2019-06-25 DIAGNOSIS — J849 Interstitial pulmonary disease, unspecified: Secondary | ICD-10-CM

## 2019-06-25 DIAGNOSIS — J449 Chronic obstructive pulmonary disease, unspecified: Secondary | ICD-10-CM

## 2019-06-25 LAB — VANCOMYCIN, PEAK: Vancomycin Pk: 50 ug/mL — ABNORMAL HIGH (ref 30–40)

## 2019-06-25 LAB — PROCALCITONIN: Procalcitonin: 0.58 ng/mL

## 2019-06-25 LAB — VANCOMYCIN, TROUGH: Vancomycin Tr: 16 ug/mL (ref 15–20)

## 2019-06-25 MED ORDER — METHYLPREDNISOLONE SODIUM SUCC 125 MG IJ SOLR
60.0000 mg | INTRAMUSCULAR | Status: DC
  Administered 2019-06-25 – 2019-06-28 (×4): 60 mg via INTRAVENOUS
  Filled 2019-06-25 (×4): qty 2

## 2019-06-25 MED ORDER — VANCOMYCIN HCL 750 MG/150ML IV SOLN
750.0000 mg | Freq: Two times a day (BID) | INTRAVENOUS | Status: DC
  Administered 2019-06-25 – 2019-06-28 (×6): 750 mg via INTRAVENOUS
  Filled 2019-06-25 (×7): qty 150

## 2019-06-25 MED ORDER — FUROSEMIDE 10 MG/ML IJ SOLN
80.0000 mg | Freq: Every day | INTRAMUSCULAR | Status: DC
  Administered 2019-06-25 – 2019-06-28 (×4): 80 mg via INTRAVENOUS
  Filled 2019-06-25 (×4): qty 8

## 2019-06-25 NOTE — Progress Notes (Signed)
NAME:  Howard Fox, MRN:  [id number], DOB:  29-Jul-1960, LOS: 3 ADMISSION DATE:  06/01/2019, CONSULTATION DATE:  06/23/2019 REFERRING MD:  [facility], CHIEF COMPLAINT:  Respiratory failure  Brief History   59 yo gentleman with history of emphysema, HTN, HLD, DM2 chronic pain and history of IVDU with vertebral osteomyelitis on suppressive chronic doxycycline. Recently discharged on 1/20 for MRSA bacteremia and respiratory failure. Was discharged on 3LNC and vancomycin with prednisone taper as well as lasix BID.Re-presented on 1/30 after found hypoxic in bathtub at home. Mother called EMS. Was admitted to [facility] and then progressively more hypoxemic and transferred to ICU.   Consults:  PCCM  Procedures:   Significant Diagnostic Tests:  Echo 1/31 1. Left ventricular ejection fraction, by visual estimation, is 55 to  60%. The left ventricle has normal function. There is mildly increased  left ventricular hypertrophy.  2. The left ventricle has no regional wall motion abnormalities.  3. Global right ventricle has moderately reduced systolic function.The  right ventricular size is mildly enlarged. Right vetricular wall thickness  was not assessed.  4. Poor acoustic windows No true apical view. RV dysfunction as noted.  Review of echo images from Jan 2021, RVEF appeared down in that as well.  5. The mitral valve is normal in structure. No evidence of mitral valve  regurgitation.  6. The tricuspid valve is normal in structure.  7. The tricuspid valve is normal in structure. Tricuspid valve  regurgitation is mild.  8. The aortic valve is tricuspid. Aortic valve regurgitation is not  visualized.  9. The pulmonic valve was grossly normal. Pulmonic valve regurgitation is  not visualized.  10. Aortic dilatation noted.  11. 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  12. Moderately elevated pulmonary artery systolic pressure.  13. The inferior vena cava is  normal in size with greater than 50%  respiratory variability, suggesting right atrial pressure of 3 mmHg.   Micro Data:  Covid PCR 1/30 > negative Blood culture 1/30 > COVID PCR 1/31 >negative  Antimicrobials:  Cefepime 1/30 > Vancomycin 1/30 >  Interim history/subjective:  Patient took high flow nasal cannula off himself and is on non-rebreather only. Insisting on going home. Wants to sign out AMA and says he has oxygen at home. Able to complete full sentences.   Objective   Blood pressure (!) 147/84, pulse 94, temperature 97.7 F (36.5 C), temperature source Oral, resp. rate (!) 33, height 6' (1.829 m), weight 103 kg, SpO2 91 %.    FiO2 (%):  [80 %-100 %] 100 %   Intake/Output Summary (Last 24 hours) at 06/25/2019 0845 Last data filed at 06/25/2019 0300 Gross per 24 hour  Intake 1075 ml  Output 3700 ml  Net -2625 ml   Filed Weights   06/20/2019 1727  Weight: 103 kg    Examination: General: Resting comfortably and able to complete full sentences, although on heated high flow and nonrebreather he is satting 98%. HENT: No scleral icterus Lungs: Clear to auscultation bilaterally, no wheezes or crackles, he has a few inspiratory squeaking noises Cardiovascular: Regular rate and rhythm Abdomen: Scaphoid, soft Extremities: No edema Neuro: AOx3, moves all 4 extremities, normal speech MSK: no rashes Lines: right PICC line  Assessment & Plan:  Acute hypoxic on chronic hypoxic respiratory failure -History of postsurgical changes related to left lower lobectomy and resection of multiple left-sided ribs with extensive surgical wires from trauma -progressive GGOs with background emphysema concerning for an  acute interstitial pneumonia.   P: Continue supplemental oxygen sats greater than 90% Given lack of improvement with empiric abx and diuresis, will initiate 1mg /kg steroids empirically. 60 mg once/day methylprednisone.  Head of bed elevated 30 degrees. Continue vancomycin,  cefepime. Cefepime for HAP, vanco for below.  Provide both scheduled and p.r.n. Bronchodilators  Repeat Covid PCR negative x2. HIV nonreactive.   Recent MRSA bacteremia with history of T-spine discitis and fusion -ID consulted on prior admission and recommended 6 weeks of IV vancomycin end date 07/17/2019 -Patient did not receive TEE prior to admission due to profound hypoxia and later development of significant anxiety -Patient follows infectious disease at baseline with most recent recommendation of doxycycline x1 year, it appears patient self discontinued antibiotics prior to first admission P: Continue IV cefepime and vancomycin Consider TEE this admission ID reconsulted  HX of Hepatitis C -Follows infectious disease, has remote history of IV drug use P: Supportive care  - s/p treatment  Chronic pain  -Home medications include oxycodone, gabapentin, and muscle relaxants  P; Continue home medications Close monitoring of mentation  May need to decrease home meds if Precedex is needed    Bipolar disease  -Home medications include Celexa P: Continue home medications. Steroids may be precipitating mania. Will consult psychiatry.   Alcohol abuse, Anxiety HX of IVDU -Patient reports daily consumption of alcohol  -Reports no IVDU over the last 3 yrs P: Monitor CIWA  Precedex for agitation and anxiety.  Thiamine. Multivitamin and folic acid replacement     HLD P: Continue statin   Best practice:  Diet: dysphagia 2 diet thin liquids Pain/Anxiety/Delirium protocol (if indicated): CIWA, precedex. VAP protocol (if indicated): n/a DVT prophylaxis: lovenox GI prophylaxis: n/a, but is on PPI, will switch to daily for steroid use Glucose control: serum BG<180  Foley no foley, uses urinal.  Mobility: OOB with assist Code Status: Full Code Family Communication: mother he lives at home with, daughter estranged.  Disposition: needs ICU for now given degree of hypoxemia,     Labs   CBC: Recent Labs  Lab 06/18/2019 1730 05/28/2019 1737 06/23/19 0952 06/24/19 0400  WBC  --  29.4* 19.8* 20.4*  NEUTROABS  --  28.2* 18.2* 18.7*  HGB 13.9 13.7 13.1 12.3*  HCT 41.0 42.3 40.4 38.4*  MCV  --  88.3 87.4 88.1  PLT  --  404* 374 [id number]    Basic Metabolic Panel: Recent Labs  Lab 06/01/2019 1730 05/26/2019 1737 06/23/19 0952 06/24/19 0400  NA 136 138 141 138  K 3.8 3.9 4.1 4.0  CL  --  101 103 99  CO2  --  25 25 28   GLUCOSE  --  147* 114* 137*  BUN  --  18 23* 25*  CREATININE  --  0.98 0.78 0.71  CALCIUM  --  8.9 9.1 9.2  MG  --   --  1.9  --    GFR: Estimated Creatinine Clearance: 125 mL/min (by C-G formula based on SCr of 0.71 mg/dL). Recent Labs  Lab 06/10/2019 1737 06/21/2019 1951 06/23/19 0952 06/24/19 0400 06/25/19 0441  PROCALCITON 0.33  --  1.58 0.95 0.58  WBC 29.4*  --  19.8* 20.4*  --   LATICACIDVEN 2.9* 1.2  --   --   --     Liver Function Tests: Recent Labs  Lab 06/03/2019 1737 06/23/19 0952  AST 38 36  ALT 61* 56*  ALKPHOS 126 122  BILITOT 0.3 0.7  PROT 6.8 6.9  ALBUMIN 2.3*  2.3*   No results for input(s): LIPASE, AMYLASE in the last 168 hours. No results for input(s): AMMONIA in the last 168 hours.  ABG    Component Value Date/Time   PHART 7.473 (H) 06/23/2019 0810   PCO2ART 35.6 06/23/2019 0810   PO2ART 44.5 (L) 06/23/2019 0810   HCO3 25.8 06/23/2019 0810   TCO2 27 05/25/2019 1730   O2SAT 80.9 06/23/2019 0810     Coagulation Profile: Recent Labs  Lab 05/30/2019 1737  INR 1.2    Cardiac Enzymes: No results for input(s): CKTOTAL, CKMB, CKMBINDEX, TROPONINI in the last 168 hours.  HbA1C: Hgb A1c MFr Bld  Date/Time Value Ref Range Status  10/25/2018 10:10 AM 5.6 <5.7 % of total Hgb Final    Comment:    For the purpose of screening for the presence of diabetes: . <5.7%       Consistent with the absence of diabetes 5.7-6.4%    Consistent with increased risk for diabetes             (prediabetes) > or =6.5%   Consistent with diabetes . This assay result is consistent with a decreased risk of diabetes. . Currently, no consensus exists regarding use of hemoglobin A1c for diagnosis of diabetes in children. . According to American Diabetes Association (ADA) guidelines, hemoglobin A1c <7.0% represents optimal control in non-pregnant diabetic patients. Different metrics may apply to specific patient populations.  Standards of Medical Care in Diabetes(ADA). Marland Kitchen   10/08/2018 05:17 AM 5.7 (H) 4.8 - 5.6 % Final    Comment:    (NOTE) Pre diabetes:          5.7%-6.4% Diabetes:              >6.4% Glycemic control for   <7.0% adults with diabetes     CBG: Recent Labs  Lab 06/24/19 1922  GLUCAP 131*    Critical care time:   The patient is critically ill with multiple organ systems failure and requires high complexity decision making for assessment and support, frequent evaluation and titration of therapies, application of advanced monitoring technologies and extensive interpretation of multiple databases.   Critical Care Time devoted to patient care services described in this note is 35 minutes. This time reflects the time of my personal involvement. This critical care time does not reflect separately billable procedures or procedure time, teaching time or supervisory time of PA/NP/Med student/Med Resident etc but could involve care discussion time.  [facility] 06/25/2019 8:45 AM  Pager: [phone] After hours pager: [phone]

## 2019-06-25 NOTE — Progress Notes (Signed)
Patient not compliant with diet orders and insisting that he is able to drink liquids normally and he will be going home today. Patient was educated by Therapist, sports. He is able to drink liquids without an apparent issues.

## 2019-06-25 NOTE — Progress Notes (Signed)
Pharmacy Antibiotic Note  Howard Fox is a 59 y.o. male admitted on 06/02/2019 with pneumonia.Pharmacy has been consulted for Cefepime and Vancomycin dosing. Patient was recently discharged on Vancomycin also for MRSA bacteremia and planned for 6 weeks of IV vancomycin. Apt to follow up with ID feb 16th.   Patients pk/tr level 50/16. The trough was drawn about 1.5 hours late so TT ~19. Patient specific kinetics ke: 0.1167 Vd: 29.7, T1/2 6hrs Predicted AUC on 750mg  q12h 432.5   Height: 6' (182.9 cm) Weight: 227 lb (103 kg) IBW/kg (Calculated) : 77.6  Temp (24hrs), Avg:97.7 F (36.5 C), Min:97.4 F (36.3 C), Max:98 F (36.7 C)  Recent Labs  Lab 06/10/2019 1737 06/01/2019 1951 06/23/19 0952 06/24/19 0400 06/25/19 0115 06/25/19 1101  WBC 29.4*  --  19.8* 20.4*  --   --   CREATININE 0.98  --  0.78 0.71  --   --   LATICACIDVEN 2.9* 1.2  --   --   --   --   VANCOTROUGH  --   --   --   --   --  16  VANCOPEAK  --   --   --   --  50*  --     Estimated Creatinine Clearance: 125 mL/min (by C-G formula based on SCr of 0.71 mg/dL).    Allergies  Allergen Reactions  . Codeine Itching  . Dilaudid [Hydromorphone Hcl] Itching  . Shellfish Allergy Diarrhea and Nausea And Vomiting    Antimicrobials this admission: 1/30 Cefepime >>  1/30 Vancomycin >>   Dose adjustments this admission: Vancomycin 1500 mg q12h pk/tr 50/19 --> 750mg  q12h  Microbiology results: 1/30 BCx: ngtd  Plan:  - cefepime 2g IV q8h  - vancomycin 750 IV q12h   Thank you for allowing pharmacy to be a part of this patient's care.Mio PharmD. BCPS 06/25/2019 12:46 PM

## 2019-06-26 LAB — BASIC METABOLIC PANEL
Anion gap: 13 (ref 5–15)
BUN: 24 mg/dL — ABNORMAL HIGH (ref 6–20)
CO2: 28 mmol/L (ref 22–32)
Calcium: 9.4 mg/dL (ref 8.9–10.3)
Chloride: 96 mmol/L — ABNORMAL LOW (ref 98–111)
Creatinine, Ser: 0.74 mg/dL (ref 0.61–1.24)
GFR calc Af Amer: >60 mL/min (ref >60)
GFR calc non Af Amer: >60 mL/min (ref >60)
Glucose, Bld: 145 mg/dL — ABNORMAL HIGH (ref 70–99)
Potassium: 3.9 mmol/L (ref 3.5–5.1)
Sodium: 137 mmol/L (ref 135–145)

## 2019-06-26 LAB — CK: Total CK: 19 U/L — ABNORMAL LOW (ref 49–397)

## 2019-06-26 MED ORDER — IBUPROFEN 200 MG PO TABS
200.0000 mg | ORAL_TABLET | Freq: Four times a day (QID) | ORAL | Status: DC | PRN
  Administered 2019-06-26: 200 mg via ORAL
  Filled 2019-06-26: qty 1

## 2019-06-26 NOTE — Plan of Care (Signed)
  Problem: Pain Managment: Goal: General experience of comfort will improve Outcome: Progressing   Problem: Safety: Goal: Ability to remain free from injury will improve Outcome: Progressing   Problem: Clinical Measurements: Goal: Respiratory complications will improve Outcome: Not Progressing Note: Pt continues to need high amounts of oxygen to support normal oxygen saturations. Even short amount of times off mask/[place] (I.e. take a bite of food) he desaturates quickly.

## 2019-06-26 NOTE — Progress Notes (Signed)
NAME:  Howard Fox, MRN:  [id number], DOB:  08/04/1960, LOS: 4 ADMISSION DATE:  05/25/2019, CONSULTATION DATE:  06/23/2019 REFERRING MD:  [facility], CHIEF COMPLAINT:  Respiratory failure  Brief History   59 yo gentleman with history of emphysema, HTN, HLD, DM2 chronic pain and history of IVDU with vertebral osteomyelitis on suppressive chronic doxycycline. Recently discharged on 1/20 for MRSA bacteremia and respiratory failure. Was discharged on 3LNC and vancomycin with prednisone taper as well as lasix BID.Re-presented on 1/30 after found hypoxic in bathtub at home. Mother called EMS. Was admitted to [facility] and then progressively more hypoxemic and transferred to ICU.   Consults:  PCCM  Procedures:   Significant Diagnostic Tests:  Echo 1/31 1. Left ventricular ejection fraction, by visual estimation, is 55 to  60%. The left ventricle has normal function. There is mildly increased  left ventricular hypertrophy.  2. The left ventricle has no regional wall motion abnormalities.  3. Global right ventricle has moderately reduced systolic function.The  right ventricular size is mildly enlarged. Right vetricular wall thickness  was not assessed.  4. Poor acoustic windows No true apical view. RV dysfunction as noted.  Review of echo images from Jan 2021, RVEF appeared down in that as well.  5. The mitral valve is normal in structure. No evidence of mitral valve  regurgitation.  6. The tricuspid valve is normal in structure.  7. The tricuspid valve is normal in structure. Tricuspid valve  regurgitation is mild.  8. The aortic valve is tricuspid. Aortic valve regurgitation is not  visualized.  9. The pulmonic valve was grossly normal. Pulmonic valve regurgitation is  not visualized.  10. Aortic dilatation noted.  11. 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  12. Moderately elevated pulmonary artery systolic pressure.  13. The inferior vena cava is  normal in size with greater than 50%  respiratory variability, suggesting right atrial pressure of 3 mmHg.   Micro Data:  Covid PCR 1/30 > negative Blood culture 1/30 > COVID PCR 1/31 >negative  Antimicrobials:  Cefepime 1/30 > Vancomycin 1/30 >  Interim history/subjective:  Patient took high flow nasal cannula off himself and is on non-rebreather only. Insisting on going home. Wants to sign out AMA and says he has oxygen at home. Able to complete full sentences.   Objective   Blood pressure 94/64, pulse 81, temperature 97.6 F (36.4 C), temperature source Oral, resp. rate (!) 28, height 6' (1.829 m), weight 103 kg, SpO2 95 %.    FiO2 (%):  [100 %] 100 %   Intake/Output Summary (Last 24 hours) at 06/26/2019 [id number] Last data filed at 06/26/2019 0600 Gross per 24 hour  Intake 1656.48 ml  Output 1900 ml  Net -243.52 ml   Filed Weights   06/20/2019 1727  Weight: 103 kg    Examination: General: Resting comfortably and able to complete full sentences, on heated high flow and nonrebreather HENT: No scleral icterus Lungs: Clear to auscultation bilaterally, no wheezes or crackles, he has a few inspiratory squeaking noises Cardiovascular: Regular rate and rhythm Abdomen: Scaphoid, soft Extremities: No edema Neuro: AOx3, moves all 4 extremities, normal speech MSK: no rashes Lines: right PICC line  Assessment & Plan:  Acute hypoxic on chronic hypoxic respiratory failure -History of postsurgical changes related to left lower lobectomy and resection of multiple left-sided ribs with extensive surgical wires from trauma -progressive GGOs with background emphysema concerning for an acute interstitial pneumonia.   P: Continue  supplemental oxygen sats greater than 90% Given lack of improvement with empiric abx and diuresis, continue 60 mg  Once/day methylprednisolone Will send autoimmune work up. Not a good candidate for high dose immune suppression given his MRSA infection. Too unstable for  bronchoscopy without intubation. Head of bed elevated 30 degrees. Continue vancomycin, completed 5 days of cefepime, will d/c today.   Provide both scheduled and p.r.n. Bronchodilators  Repeat Covid PCR negative x2. HIV nonreactive.   Recent MRSA bacteremia with history of T-spine discitis and fusion -ID consulted on prior admission and recommended 6 weeks of IV vancomycin end date 07/17/2019 -Patient did not receive TEE prior to admission due to profound hypoxia and later development of significant anxiety -Patient follows infectious disease at baseline with most recent recommendation of doxycycline x1 year, it appears patient self discontinued antibiotics prior to first admission P: Continue IV cefepime and vancomycin Consider TEE this admission ID reconsulted  HX of Hepatitis C -Follows infectious disease, has remote history of IV drug use P: Supportive care  - s/p treatment  Chronic pain  -Home medications include oxycodone, gabapentin, and muscle relaxants  P; Continue home medications Close monitoring of mentation  May need to decrease home meds if Precedex is needed    Bipolar disease  -Home medications include Celexa P: Continue home medications. Steroids may be precipitating mania. Will consult psychiatry.   Alcohol abuse, Anxiety HX of IVDU -Patient reports daily consumption of alcohol, will monitor for withdrawal.  -Reports no IVDU over the last 3 yrs P: Precedex for agitation and anxiety.  Thiamine. Multivitamin and folic acid replacement     HLD P: Continue statin   Best practice:  Diet: dysphagia 2 diet thin liquids Pain/Anxiety/Delirium protocol (if indicated): precedex. VAP protocol (if indicated): n/a DVT prophylaxis: lovenox GI prophylaxis: n/a, but is on PPI, will switch to daily for steroid use Glucose control: serum BG<180  Foley no foley, uses urinal.  Mobility: OOB with assist Code Status: Full Code Family Communication: mother he lives  at home with, daughter estranged.  Disposition: needs ICU for now given degree of hypoxemia,    Labs   CBC: Recent Labs  Lab 06/14/2019 1730 06/04/2019 1737 06/23/19 0952 06/24/19 0400  WBC  --  29.4* 19.8* 20.4*  NEUTROABS  --  28.2* 18.2* 18.7*  HGB 13.9 13.7 13.1 12.3*  HCT 41.0 42.3 40.4 38.4*  MCV  --  88.3 87.4 88.1  PLT  --  404* 374 [id number]    Basic Metabolic Panel: Recent Labs  Lab 05/25/2019 1730 06/17/2019 1737 06/23/19 0952 06/24/19 0400  NA 136 138 141 138  K 3.8 3.9 4.1 4.0  CL  --  101 103 99  CO2  --  25 25 28   GLUCOSE  --  147* 114* 137*  BUN  --  18 23* 25*  CREATININE  --  0.98 0.78 0.71  CALCIUM  --  8.9 9.1 9.2  MG  --   --  1.9  --    GFR: Estimated Creatinine Clearance: 125 mL/min (by C-G formula based on SCr of 0.71 mg/dL). Recent Labs  Lab 06/20/2019 1737 06/07/2019 1951 06/23/19 0952 06/24/19 0400 06/25/19 0441  PROCALCITON 0.33  --  1.58 0.95 0.58  WBC 29.4*  --  19.8* 20.4*  --   LATICACIDVEN 2.9* 1.2  --   --   --     Liver Function Tests: Recent Labs  Lab 06/13/2019 1737 06/23/19 0952  AST 38 36  ALT 61* 56*  ALKPHOS 126 122  BILITOT 0.3 0.7  PROT 6.8 6.9  ALBUMIN 2.3* 2.3*   No results for input(s): LIPASE, AMYLASE in the last 168 hours. No results for input(s): AMMONIA in the last 168 hours.  ABG    Component Value Date/Time   PHART 7.473 (H) 06/23/2019 0810   PCO2ART 35.6 06/23/2019 0810   PO2ART 44.5 (L) 06/23/2019 0810   HCO3 25.8 06/23/2019 0810   TCO2 27 06/19/2019 1730   O2SAT 80.9 06/23/2019 0810     Coagulation Profile: Recent Labs  Lab 06/16/2019 1737  INR 1.2    Cardiac Enzymes: No results for input(s): CKTOTAL, CKMB, CKMBINDEX, TROPONINI in the last 168 hours.  HbA1C: Hgb A1c MFr Bld  Date/Time Value Ref Range Status  10/25/2018 10:10 AM 5.6 <5.7 % of total Hgb Final    Comment:    For the purpose of screening for the presence of diabetes: . <5.7%       Consistent with the absence of  diabetes 5.7-6.4%    Consistent with increased risk for diabetes             (prediabetes) > or =6.5%  Consistent with diabetes . This assay result is consistent with a decreased risk of diabetes. . Currently, no consensus exists regarding use of hemoglobin A1c for diagnosis of diabetes in children. . According to American Diabetes Association (ADA) guidelines, hemoglobin A1c <7.0% represents optimal control in non-pregnant diabetic patients. Different metrics may apply to specific patient populations.  Standards of Medical Care in Diabetes(ADA). Marland Kitchen   10/08/2018 05:17 AM 5.7 (H) 4.8 - 5.6 % Final    Comment:    (NOTE) Pre diabetes:          5.7%-6.4% Diabetes:              >6.4% Glycemic control for   <7.0% adults with diabetes     CBG: Recent Labs  Lab 06/24/19 1922  GLUCAP 131*    Critical care time:   The patient is critically ill with multiple organ systems failure and requires high complexity decision making for assessment and support, frequent evaluation and titration of therapies, application of advanced monitoring technologies and extensive interpretation of multiple databases.   Critical Care Time devoted to patient care services described in this note is 34 minutes. This time reflects the time of my personal involvement. This critical care time does not reflect separately billable procedures or procedure time, teaching time or supervisory time of PA/NP/Med student/Med Resident etc but could involve care discussion time.  [facility] 06/26/2019 8:23 AM  Pager: [phone] After hours pager: [phone]

## 2019-06-26 NOTE — Consult Note (Signed)
Telepsych Consultation   Reason for Consult: "Bipolar disorder, now on high-dose steroids, please assist with medications to prevent mania." Referring Physician:  Dr. Shearon Stalls Location of [facility]: [facility]  location of Provider: [facility]  Patient Identification: Howard Fox MRN:  [id number] Principal Diagnosis: Chronic pain syndrome Diagnosis:  Principal Problem:   Chronic pain syndrome Active Problems:   Bipolar disorder ([place])   Essential hypertension   Hyperlipidemia, mixed   Obesity (BMI 30.0-34.9)   Hepatitis C, chronic (HCC)   MRSA bacteremia   Acute respiratory failure with hypoxia (HCC)   Thrombocytosis ([place])   DNR (do not resuscitate) discussion   Total Time spent with patient: 30 minutes  Subjective:   Howard Fox is a 59 y.o. male patient admitted with hypoxia.  Patient assessed by nurse practitioner.  Patient alert and oriented, answers appropriately.  Patient states "I am in the hospital this time with pneumonia." Patient denies suicidal and homicidal ideations.  Patient denies history of self-harm or suicide attempts.  Patient denies auditory and visual hallucinations.  Patient states "I have not had a psychiatrist since 2008 when I was placed on disability, it would cost $100 monthly and I cannot afford that. Patient reports primary care provider prescribes Celexa, Elavil, and Neurontin.  Patient reports "really I need 1 mg of Xanax 3 times a day but my doctor will not write it anymore."  Patient reports "my spine Dr. Prescribes my oxycodone and I am currently on my last bottle of oxycodone."  Patient reports consuming 2-12 ounce beers daily for his "kidney function."  Patient reports use of CBD.  Patient denies other substance use.  Patient reports he lives with his mother but independent regarding activities of daily living.  Patient denies weapons in home. Patient reports he sleeps fantastic "when I take my Elavil."  Patient reports average  appetite. Case discussed with Dr. Parke Poisson who recommends continue home medications. HPI: Patient admitted with hypoxia.  Past Psychiatric History: Bipolar disorder  Risk to Self:  Denies Risk to Others:  Denies  prior Inpatient Therapy:  Denies Prior Outpatient Therapy:  Yes  Past Medical History:  Past Medical History:  Diagnosis Date  . Anxiety 10/25/2018  . Arthritis   . Bipolar 1 disorder ([place])   . Depression   . Fracture, ribs 1993   ruptured stomach, left lung resulted from industrial fall  . Heart murmur    as a child  . Hepatitis C   . History of kidney stones   . Hypertension   . Kidney stones   . Low blood pressure 02/20/2019  . MRSA bacteremia 10/09/2018  . S/P lumbar fusion 02/01/2019  . Vertebral osteomyelitis ([place]) 10/25/2018    Past Surgical History:  Procedure Laterality Date  . CHOLECYSTECTOMY    . LAMINECTOMY WITH POSTERIOR LATERAL ARTHRODESIS LEVEL 4 N/A 02/01/2019   Procedure: Laminectomy and Foraminotomy - Thoracic ten-Thoracic eleven, Instrumented fusion Thoracic eight-Lumbar two;  Surgeon: Eustace Moore, MD;  Location: [place];  Service: Neurosurgery;  Laterality: N/A;  . laser surgery for kidney stones Left   . LITHOTRIPSY     Family History:  Family History  Problem Relation Age of Onset  . Mental illness Mother        Bipolar  . Stroke Father   . Arthritis Father    Family Psychiatric  History: Denies Social History:  Social History   Substance and Sexual Activity  Alcohol Use Yes   Comment: 2 beers a day  Social History   Substance and Sexual Activity  Drug Use Yes  . Frequency: 1.0 times per week  . Types: Marijuana   Comment: last time 2 weeks ago    Social History   Socioeconomic History  . Marital status: Divorced    Spouse name: Not on file  . Number of children: Not on file  . Years of education: Not on file  . Highest education level: Not on file  Occupational History  . Not on file  Tobacco Use  . Smoking status:  Former Smoker    Packs/day: 0.50    Years: 42.00    Pack years: 21.00    Types: Cigarettes    Start date: 01/02/1977    Quit date: 06/05/2019    Years since quitting: 0.0  . Smokeless tobacco: Never Used  Substance and Sexual Activity  . Alcohol use: Yes    Comment: 2 beers a day  . Drug use: Yes    Frequency: 1.0 times per week    Types: Marijuana    Comment: last time 2 weeks ago  . Sexual activity: Not on file  Other Topics Concern  . Not on file  Social History Narrative  . Not on file   Social Determinants of Health   Financial Resource Strain:   . Difficulty of Paying Living Expenses: Not on file  Food Insecurity:   . Worried About Charity fundraiser in the Last Year: Not on file  . Ran Out of Food in the Last Year: Not on file  Transportation Needs:   . Lack of Transportation (Medical): Not on file  . Lack of Transportation (Non-Medical): Not on file  Physical Activity:   . Days of Exercise per Week: Not on file  . Minutes of Exercise per Session: Not on file  Stress:   . Feeling of Stress : Not on file  Social Connections:   . Frequency of Communication with Friends and Family: Not on file  . Frequency of Social Gatherings with Friends and Family: Not on file  . Attends Religious Services: Not on file  . Active Member of Clubs or Organizations: Not on file  . Attends Archivist Meetings: Not on file  . Marital Status: Not on file   Additional Social History:    Allergies:   Allergies  Allergen Reactions  . Codeine Itching  . Dilaudid [Hydromorphone Hcl] Itching  . Shellfish Allergy Diarrhea and Nausea And Vomiting    Labs:  Results for orders placed or performed during the hospital encounter of 06/09/2019 (from the past 48 hour(s))  Glucose, capillary     Status: Abnormal   Collection Time: 06/24/19  7:22 PM  Result Value Ref Range   Glucose-Capillary 131 (H) 70 - 99 mg/dL  Vancomycin, peak     Status: Abnormal   Collection Time: 06/25/19   1:15 AM  Result Value Ref Range   Vancomycin Pk 50 (H) 30 - 40 ug/mL    Comment: Performed at [facility], [place] [address], [place], [place] [address]  Procalcitonin     Status: None   Collection Time: 06/25/19  4:41 AM  Result Value Ref Range   Procalcitonin 0.58 ng/mL    Comment:        Interpretation: PCT > 0.5 ng/mL and <= 2 ng/mL: Systemic infection (sepsis) is possible, but other conditions are known to elevate PCT as well. (NOTE)       Sepsis PCT Algorithm  Lower Respiratory Tract                                      Infection PCT Algorithm    ----------------------------     ----------------------------         PCT < 0.25 ng/mL                PCT < 0.10 ng/mL         Strongly encourage             Strongly discourage   discontinuation of antibiotics    initiation of antibiotics    ----------------------------     -----------------------------       PCT 0.25 - 0.50 ng/mL            PCT 0.10 - 0.25 ng/mL               OR       >80% decrease in PCT            Discourage initiation of                                            antibiotics      Encourage discontinuation           of antibiotics    ----------------------------     -----------------------------         PCT >= 0.50 ng/mL              PCT 0.26 - 0.50 ng/mL                AND       <80% decrease in PCT             Encourage initiation of                                             antibiotics       Encourage continuation           of antibiotics    ----------------------------     -----------------------------        PCT >= 0.50 ng/mL                  PCT > 0.50 ng/mL               AND         increase in PCT                  Strongly encourage                                      initiation of antibiotics    Strongly encourage escalation           of antibiotics                                     -----------------------------  PCT <= 0.25 ng/mL                                                  OR                                        > 80% decrease in PCT                                     Discontinue / Do not initiate                                             antibiotics Performed at [facility], [place] [address] [place], [place] [address]   Vancomycin, trough     Status: None   Collection Time: 06/25/19 11:01 AM  Result Value Ref Range   Vancomycin Tr 16 15 - 20 ug/mL    Comment: Performed at [place] [address], [place], [place] [id number]  Basic metabolic panel     Status: Abnormal   Collection Time: 06/26/19  9:56 AM  Result Value Ref Range   Sodium 137 135 - 145 mmol/L   Potassium 3.9 3.5 - 5.1 mmol/L   Chloride 96 (L) 98 - 111 mmol/L   CO2 28 22 - 32 mmol/L   Glucose, Bld 145 (H) 70 - 99 mg/dL   BUN 24 (H) 6 - 20 mg/dL   Creatinine, Ser 0.74 0.61 - 1.24 mg/dL   Calcium 9.4 8.9 - 10.3 mg/dL   GFR calc non Af Amer >60 >60 mL/min   GFR calc Af Amer >60 >60 mL/min   Anion gap 13 5 - 15    Comment: Performed at [facility], [place] [address] [place], [place] [address]    Medications:  Current Facility-Administered Medications  Medication Dose Route Frequency Provider Last Rate Last Admin  . albuterol (PROVENTIL) (2.5 MG/3ML) 0.083% nebulizer solution 2.5 mg  2.5 mg Inhalation Q2H PRN Aline August, MD      . ALPRAZolam Duanne Moron) tablet 0.5 mg  0.5 mg Oral TID PRN Spero Geralds, MD   0.5 mg at 06/26/19 [id number]  . amitriptyline (ELAVIL) tablet 150 mg  150 mg Oral QHS Rush Farmer, MD   150 mg at 06/25/19 2028  . aspirin EC tablet 325 mg  325 mg Oral Daily Tu, Ching T, DO   325 mg at 06/26/19 [id number]  . budesonide (PULMICORT) nebulizer solution 0.5 mg  0.5 mg Nebulization BID Aline August, MD   0.5 mg at 06/26/19 0739  . carisoprodol (SOMA) tablet 350 mg  350 mg Oral Q6H PRN Tu, Ching T, DO   350 mg at 06/26/19 0454  . Chlorhexidine Gluconate Cloth 2 % PADS 6 each  6 each Topical Daily Rush Farmer, MD   6 each at 06/25/19 1712  . citalopram (CELEXA) tablet 20 mg  20 mg Oral Q lunch Tu, Ching T, DO   20 mg at 06/25/19 1137  . dexmedetomidine (PRECEDEX) 400 MCG/100ML (4 mcg/mL) infusion  0.4-1.2 mcg/kg/hr Intravenous Titrated Merlene Laughter F, NP 5.15 mL/hr at 06/26/19 1100 0.2 mcg/kg/hr at 06/26/19 1100  . enoxaparin (LOVENOX) injection 40 mg  40 mg Subcutaneous Q24H Tu, Ching T, DO   40 mg at 06/25/19 1809  . folic acid (FOLVITE) tablet 1 mg  1 mg Oral Daily Merlene Laughter F, NP   1 mg at 06/26/19 [id number]  . furosemide (LASIX) injection 80 mg  80 mg Intravenous Daily Spero Geralds, MD   80 mg at 06/26/19 0909  . gabapentin (NEURONTIN) capsule 300 mg  300 mg Oral Daily Tu, Ching T, DO   300 mg at 06/26/19 [id number]  . ipratropium-albuterol (DUONEB) 0.5-2.5 (3) MG/3ML nebulizer solution 3 mL  3 mL Nebulization Q6H Rush Farmer, MD   3 mL at 06/26/19 0739  . methylPREDNISolone sodium succinate (SOLU-MEDROL) 125 mg/2 mL injection 60 mg  60 mg Intravenous Q24H Spero Geralds, MD   60 mg at 06/26/19 0909  . multivitamin with minerals tablet 1 tablet  1 tablet Oral Daily Merlene Laughter F, NP   1 tablet at 06/26/19 0920  . oxyCODONE (Oxy IR/ROXICODONE) immediate release tablet 5-10 mg  5-10 mg Oral Q6H PRN Tu, Ching T, DO   10 mg at 06/26/19 0606  . pantoprazole (PROTONIX) EC tablet 40 mg  40 mg Oral Daily Rush Farmer, MD   40 mg at 06/26/19 [id number]  . potassium chloride (KLOR-CON) CR tablet 10 mEq  10 mEq Oral Daily Tu, Ching T, DO   10 mEq at 06/26/19 [id number]  . rosuvastatin (CRESTOR) tablet 40 mg  40 mg Oral Daily Tu, Ching T, DO   40 mg at 06/26/19 [id number]  . thiamine tablet 100 mg  100 mg Oral Daily Merlene Laughter F, NP   100 mg at 06/24/19 [id number]   Or  . thiamine (B-1) injection 100 mg  100 mg Intravenous Daily Merlene Laughter F, NP   100 mg at 06/26/19 [id number]  . vancomycin (VANCOREADY) IVPB 750 mg/150 mL  750 mg Intravenous Q12H Spero Geralds, MD   Stopped at 06/26/19 0602     Musculoskeletal: Strength & Muscle Tone: within normal limits Gait & Station: Unable to assess Patient leans: Unable to assess  Psychiatric Specialty Exam: Physical Exam  Nursing note and vitals reviewed. Constitutional: He is oriented to person, place, and time. He appears well-developed.  HENT:  Head: Normocephalic.  Cardiovascular: Normal rate.  Respiratory: Effort normal.  Neurological: He is alert and oriented to person, place, and time.  Psychiatric: He has a normal mood and affect. His behavior is normal. Judgment and thought content normal.    Review of Systems  Constitutional: Negative.   HENT: Negative.   Eyes: Negative.   Respiratory: Negative.   Cardiovascular: Negative.   Gastrointestinal: Negative.   Genitourinary: Negative.   Musculoskeletal: Negative.   Skin: Negative.   Neurological: Negative.     Blood pressure 103/80, pulse 87, temperature (!) 96.5 F (35.8 C), temperature source Axillary, resp. rate (!) 36, height 6' (1.829 m), weight 103 kg, SpO2 97 %.Body mass index is 30.79 kg/m.  General Appearance: Casual and Fairly Groomed  Eye Contact:  Good  Speech:  Clear and Coherent and Normal Rate  Volume:  Normal  Mood:  Euthymic  Affect:  Appropriate and Congruent  Thought Process:  Coherent, Goal Directed and Descriptions of Associations: Intact  Orientation:  Full (Time, Place, and Person)  Thought Content:  WDL and Logical  Suicidal Thoughts:  No  Homicidal Thoughts:  No  Memory:  Immediate;   Good Recent;   Good Remote;   Good  Judgement:  Good  Insight:  Good  Psychomotor Activity:  Normal  Concentration:  Concentration: Good and Attention Span: Good  Recall:  Good  Fund of Knowledge:  Good  Language:  Good  Akathisia:  No  Handed:  Right  AIMS (if indicated):     Assets:  Communication Skills Desire for Improvement Financial Resources/Insurance Housing Intimacy Leisure Time Resilience Social  Support Talents/Skills Transportation  ADL's:  Intact  Cognition:  WNL  Sleep:        Treatment Plan Summary: Plan Patient cleared by psychiatry.  With regard to mood disorder patient currently not endorsing any symptoms of mania or hypomania, patient is stable on antidepressants (no mood stabilizer) for a long time.  Regarding capacity spoke with patient to address dangers of sedating medications particularly in combination with respiratory history, in particular oxycodone and other sedating medications to include Neurontin.  Consider tapering off of benzodiazepine medications and Neurontin.  Patient verbalizes understanding of current illness and possibility of deteriorating respiratory function and even possibly death once discharged home.  Patient does have capacity to participate in discharge planning.    Disposition: No evidence of imminent risk to self or others at present.   Patient does not meet criteria for psychiatric inpatient admission. Supportive therapy provided about ongoing stressors. Discussed crisis plan, support from social network, calling 911, coming to the Emergency Department, and calling Suicide Hotline.  This service was provided via telemedicine using a 2-way, interactive audio and video technology.  Names of all persons participating in this telemedicine service and their role in this encounter. Name: Herbert Spires Role: Patient  Name: Letitia Libra Role: [place], [place] 06/26/2019 11:49 AM

## 2019-06-26 NOTE — Progress Notes (Signed)
[place] Progress Note Patient Name: Howard Fox DOB: 07/11/1960 MRN: [id number]   Date of Service  06/26/2019  HPI/Events of Note  Patient requests Motrin for chronic pain - Creatinine = 0.74.  eICU Interventions  Will order: 1. Motrin 200 mg PO Q 6 hours PRN mild or moderate pain.      Intervention Category Major Interventions: Other:  Jayant Kriz,Asaiah Cornelia Copa 06/26/2019, 8:04 PM

## 2019-06-27 DIAGNOSIS — J9621 Acute and chronic respiratory failure with hypoxia: Principal | ICD-10-CM

## 2019-06-27 LAB — CULTURE, BLOOD (ROUTINE X 2)
Culture: NO GROWTH
Culture: NO GROWTH
Special Requests: ADEQUATE
Special Requests: ADEQUATE

## 2019-06-27 LAB — ANTINUCLEAR ANTIBODIES, IFA: ANA Ab, IFA: NEGATIVE

## 2019-06-27 LAB — RHEUMATOID FACTOR: Rheumatoid fact SerPl-aCnc: 17.9 IU/mL — ABNORMAL HIGH (ref 0.0–13.9)

## 2019-06-27 LAB — ALDOLASE: Aldolase: 13.8 U/L — ABNORMAL HIGH (ref 3.3–10.3)

## 2019-06-27 MED ORDER — SODIUM CHLORIDE 0.9 % IV SOLN
INTRAVENOUS | Status: DC | PRN
  Administered 2019-06-27 – 2019-06-28 (×2): 250 mL via INTRAVENOUS

## 2019-06-27 NOTE — Progress Notes (Signed)
  Speech Language Pathology Treatment: Dysphagia  Patient Details Name: Howard Fox MRN: [id number] DOB: 1960/09/28 Today's Date: 06/27/2019 Time: 5573-2202 SLP Time Calculation (min) (ACUTE ONLY): 8 min  Assessment / Plan / Recommendation Clinical Impression  Pt on non rebreather mask when therapist arrived and reported he just completed a breathing treatment. Did not observe with po's this session. Discussed efficiency of current texture (Dys 2 ground-minced) and pt stated he would like to remain on this texture. He has been receiving additional gravy (therapist requested from dietitian). If he decides he wants to try higher textures, RN can request order from MD and if pt experiences difficulty can reconsult ST. RN reported pt had cookie in his room and pt's mom brought him a burrito. This is fine as Dys 2 is pt's choice. Will d/c ST.    HPI HPI: Howard Fox is a 59 y.o. 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        SLP Plan  All goals met       Recommendations  Diet recommendations: Dysphagia 2 (fine chop);Thin liquid Liquids provided via: Cup;Straw Medication Administration: Whole meds with puree Supervision: Patient able to self feed;Intermittent supervision to cue for compensatory strategies Compensations: Slow rate;Small sips/bites;Minimize environmental distractions Postural Changes and/or Swallow Maneuvers: Seated upright 90 degrees                Oral Care Recommendations: Oral care BID Follow up Recommendations: None SLP Visit Diagnosis: Dysphagia, oral phase (R13.11) Plan: All goals met       GO                Houston Siren 06/27/2019, 4:04 PM

## 2019-06-27 NOTE — Progress Notes (Signed)
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 Recently discharged on 1/20 for MRSA bacteremia and respiratory failure. Was discharged on 3LNC and vancomycin with prednisone taper as well as lasix BID.Re-presented on 1/30 after found hypoxic in bathtub at home. Mother called EMS. Was admitted to [facility] and then progressively more hypoxemic and transferred to ICU.   Consults:  PCCM  Procedures:   Significant Diagnostic Tests:  Echo 1/31 1. Left ventricular ejection fraction, by visual estimation, is 55 to  60%. The left ventricle has normal function. There is mildly increased  left ventricular hypertrophy.  2. The left ventricle has no regional wall motion abnormalities.  3. Global right ventricle has moderately reduced systolic function.The  right ventricular size is mildly enlarged. Right vetricular wall thickness  was not assessed.  4. Poor acoustic windows No true apical view. RV dysfunction as noted.  Review of echo images from Jan 2021, RVEF appeared down in that as well.  5. The mitral valve is normal in structure. No evidence of mitral valve  regurgitation.  6. The tricuspid valve is normal in structure.  7. The tricuspid valve is normal in structure. Tricuspid valve  regurgitation is mild.  8. The aortic valve is tricuspid. Aortic valve regurgitation is not  visualized.  9. The pulmonic valve was grossly normal. Pulmonic valve regurgitation is  not visualized.  10. Aortic dilatation noted.  11. 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  12. Moderately elevated pulmonary artery systolic pressure.  13. The inferior vena cava is  normal in size with greater than 50%  respiratory variability, suggesting right atrial pressure of 3 mmHg.   Micro Data:  Covid PCR 1/30 > negative Blood culture 1/30 > COVID PCR 1/31 >negative  Antimicrobials:  Cefepime 1/30 > Vancomycin 1/30 >  Interim history/subjective:  On non-rebreather for comfort over heated high flow. Off precedex since yesterday morning.   Objective   Blood pressure 120/70, pulse 99, temperature 97.7 F (36.5 C), temperature source Axillary, resp. rate (!) 27, height 6' (1.829 m), weight 103 kg, SpO2 98 %.    FiO2 (%):  [10 %-100 %] 100 %   Intake/Output Summary (Last 24 hours) at 06/27/2019 0955 Last data filed at 06/27/2019 0800 Gross per 24 hour  Intake 1047.34 ml  Output 2100 ml  Net -1052.66 ml   Filed Weights   06/12/2019 1727  Weight: 103 kg    Examination: General: Resting comfortably and able to complete full sentences, on nonrebreather HENT: No scleral icterus Lungs: Clear to auscultation bilaterally, no wheezes or crackles, he has a few inspiratory squeaking noises Cardiovascular: tachycardic rate and regularrhythm Abdomen: Scaphoid, soft Extremities: No edema Neuro: AOx3, moves all 4 extremities, normal speech MSK: no rashes Lines: right PICC line  Assessment & Plan:  Acute hypoxic on chronic hypoxic respiratory failure -History of postsurgical changes related to left lower lobectomy and resection of multiple left-sided ribs with extensive surgical wires from trauma -progressive GGOs with background emphysema concerning for an acute interstitial pneumonia.   P: Continue supplemental oxygen sats greater than 90% Given lack of improvement with empiric abx and diuresis, continue 60 mg  Once/day methylprednisolone Aldolase elevated, normal  CK. Doubt anti-synthetase syndrome, but will send serologies.  RF factor elevated. Will await ANA.  Not a good candidate for high dose immune suppression given his MRSA infection. Too unstable for  bronchoscopy without intubation. Head of bed elevated 30 degrees. Continue vancomycin, completed 5 days of cefepim Provide both scheduled and p.r.n. Bronchodilators  Repeat Covid PCR negative x2. HIV nonreactive.   Recent MRSA bacteremia with history of T-spine discitis and fusion -ID consulted on prior admission and recommended 6 weeks of IV vancomycin end date 07/17/2019 -Patient did not receive TEE prior to admission due to profound hypoxia and later development of significant anxiety -Patient follows infectious disease at baseline with most recent recommendation of doxycycline x1 year, it appears patient self discontinued antibiotics prior to first admission P: Continue IV vancomycin ID following peripherally   Bipolar disease  -Home medications include Celexa P: Continue home medications. Evaluated by psych. Not felt to be manic and does have capacity to make his own decisions.   Alcohol abuse, Anxiety HX of IVDU -Patient reports daily consumption of alcohol, will monitor for withdrawal.  -Reports no IVDU over the last 3 yrs P: Precedex for agitation and anxiety.  Thiamine. Multivitamin and folic acid replacement     HLD P: Continue statin   HX of Hepatitis C -Follows infectious disease, has remote history of IV drug use P: Supportive care  - s/p treatment  Chronic pain  -Home medications include oxycodone, gabapentin, and muscle relaxants  P; Continue home medications Close monitoring of mentation    Best practice:  Diet: dysphagia 2 diet thin liquids Pain/Anxiety/Delirium protocol (if indicated): precedex. VAP protocol (if indicated): n/a DVT prophylaxis: lovenox GI prophylaxis: n/a, but is on PPI, will switch to daily for steroid use Glucose control: serum BG<180  Foley no foley, uses urinal.  Mobility: OOB with assist Code Status: Full Code Family Communication: mother he lives at home with, daughter estranged.  Disposition: needs ICU for now given  degree of hypoxemia. Will consider weaning and transfer to PCU.    Labs   CBC: Recent Labs  Lab 05/25/2019 1730 05/27/2019 1737 06/23/19 0952 06/24/19 0400  WBC  --  29.4* 19.8* 20.4*  NEUTROABS  --  28.2* 18.2* 18.7*  HGB 13.9 13.7 13.1 12.3*  HCT 41.0 42.3 40.4 38.4*  MCV  --  88.3 87.4 88.1  PLT  --  404* 374 [id number]    Basic Metabolic Panel: Recent Labs  Lab 06/08/2019 1730 06/13/2019 1737 06/23/19 0952 06/24/19 0400 06/26/19 0956  NA 136 138 141 138 137  K 3.8 3.9 4.1 4.0 3.9  CL  --  101 103 99 96*  CO2  --  25 25 28 28   GLUCOSE  --  147* 114* 137* 145*  BUN  --  18 23* 25* 24*  CREATININE  --  0.98 0.78 0.71 0.74  CALCIUM  --  8.9 9.1 9.2 9.4  MG  --   --  1.9  --   --    GFR: Estimated Creatinine Clearance: 125 mL/min (by C-G formula based on SCr of 0.74 mg/dL). Recent Labs  Lab 06/11/2019 1737 06/09/2019 1951 06/23/19 0952 06/24/19 0400 06/25/19 0441  PROCALCITON 0.33  --  1.58 0.95 0.58  WBC 29.4*  --  19.8* 20.4*  --   LATICACIDVEN 2.9* 1.2  --   --   --     Liver Function Tests: Recent Labs  Lab 06/03/2019 1737 06/23/19 0952  AST 38 36  ALT 61* 56*  ALKPHOS 126 122  BILITOT 0.3 0.7  PROT 6.8 6.9  ALBUMIN 2.3* 2.3*   No results for input(s): LIPASE, AMYLASE in the last 168 hours. No results for input(s): AMMONIA in the last 168 hours.  ABG    Component Value Date/Time   PHART 7.473 (H) 06/23/2019 0810   PCO2ART 35.6 06/23/2019 0810   PO2ART 44.5 (L) 06/23/2019 0810   HCO3 25.8 06/23/2019 0810   TCO2 27 05/30/2019 1730   O2SAT 80.9 06/23/2019 0810     Coagulation Profile: Recent Labs  Lab 06/23/2019 1737  INR 1.2    Cardiac Enzymes: Recent Labs  Lab 06/26/19 1357  CKTOTAL 19*    HbA1C: Hgb A1c MFr Bld  Date/Time Value Ref Range Status  10/25/2018 10:10 AM 5.6 <5.7 % of total Hgb Final    Comment:    For the purpose of screening for the presence of diabetes: . <5.7%       Consistent with the absence of diabetes 5.7-6.4%     Consistent with increased risk for diabetes             (prediabetes) > or =6.5%  Consistent with diabetes . This assay result is consistent with a decreased risk of diabetes. . Currently, no consensus exists regarding use of hemoglobin A1c for diagnosis of diabetes in children. . According to American Diabetes Association (ADA) guidelines, hemoglobin A1c <7.0% represents optimal control in non-pregnant diabetic patients. Different metrics may apply to specific patient populations.  Standards of Medical Care in Diabetes(ADA). Marland Kitchen   10/08/2018 05:17 AM 5.7 (H) 4.8 - 5.6 % Final    Comment:    (NOTE) Pre diabetes:          5.7%-6.4% Diabetes:              >6.4% Glycemic control for   <7.0% adults with diabetes     CBG: Recent Labs  Lab 06/24/19 1922  GLUCAP 131*    Critical care time:   The patient is critically ill with multiple organ systems failure and requires high complexity decision making for assessment and support, frequent evaluation and titration of therapies, application of advanced monitoring technologies and extensive interpretation of multiple databases.   Critical Care Time devoted to patient care services described in this note is 38 minutes. This time reflects the time of my personal involvement. This critical care time does not reflect separately billable procedures or procedure time, teaching time or supervisory time of PA/NP/Med student/Med Resident etc but could involve care discussion time.  [facility] 06/27/2019 9:55 AM  Pager: [phone] After hours pager: [phone]

## 2019-06-28 ENCOUNTER — Inpatient Hospital Stay (HOSPITAL_COMMUNITY): Payer: Medicare Other

## 2019-06-28 DIAGNOSIS — Z7189 Other specified counseling: Secondary | ICD-10-CM

## 2019-06-28 DIAGNOSIS — Z515 Encounter for palliative care: Secondary | ICD-10-CM

## 2019-06-28 LAB — GLUCOSE, CAPILLARY
Glucose-Capillary: 152 mg/dL — ABNORMAL HIGH (ref 70–99)
Glucose-Capillary: 140 mg/dL — ABNORMAL HIGH (ref 70–99)

## 2019-06-28 LAB — POCT I-STAT 7, (LYTES, BLD GAS, ICA,H+H)
Acid-Base Excess: 4 mmol/L — ABNORMAL HIGH (ref 0.0–2.0)
Bicarbonate: 35.1 mmol/L — ABNORMAL HIGH (ref 20.0–28.0)
Calcium, Ion: 1.3 mmol/L (ref 1.15–1.40)
HCT: 50 % (ref 39.0–52.0)
Hemoglobin: 17 g/dL (ref 13.0–17.0)
O2 Saturation: 95 %
Patient temperature: 98.3
Potassium: 4.8 mmol/L (ref 3.5–5.1)
Sodium: 136 mmol/L (ref 135–145)
TCO2: 38 mmol/L — ABNORMAL HIGH (ref 22–32)
pCO2 arterial: 82.6 mmHg (ref 32.0–48.0)
pH, Arterial: 7.235 — ABNORMAL LOW (ref 7.350–7.450)
pO2, Arterial: 91 mmHg (ref 83.0–108.0)

## 2019-06-28 LAB — BLOOD GAS, ARTERIAL
Acid-Base Excess: 6 mmol/L — ABNORMAL HIGH (ref 0.0–2.0)
Bicarbonate: 32.9 mmol/L — ABNORMAL HIGH (ref 20.0–28.0)
FIO2: 90
O2 Saturation: 96.7 %
Patient temperature: 36.7
pCO2 arterial: 76.8 mmHg (ref 32.0–48.0)
pH, Arterial: 7.253 — ABNORMAL LOW (ref 7.350–7.450)
pO2, Arterial: 110 mmHg — ABNORMAL HIGH (ref 83.0–108.0)

## 2019-06-28 MED ORDER — FENTANYL CITRATE (PF) 100 MCG/2ML IJ SOLN: 100.0000 ug | Freq: Once | INTRAMUSCULAR | Status: AC

## 2019-06-28 MED ORDER — FENTANYL CITRATE (PF) 100 MCG/2ML IJ SOLN
INTRAMUSCULAR | Status: AC
  Administered 2019-06-28: 100 ug via INTRAVENOUS
  Filled 2019-06-28: qty 2

## 2019-06-28 MED ORDER — ROCURONIUM BROMIDE 50 MG/5ML IV SOLN
1.0000 mg/kg | Freq: Once | INTRAVENOUS | Status: AC
  Administered 2019-06-28: 100 mg via INTRAVENOUS
  Filled 2019-06-28: qty 10.3

## 2019-06-28 MED ORDER — LORAZEPAM 2 MG/ML IJ SOLN
INTRAMUSCULAR | Status: AC
  Filled 2019-06-28: qty 1

## 2019-06-28 MED ORDER — LORAZEPAM 2 MG/ML IJ SOLN: 1.0000 mg | Freq: Once | INTRAMUSCULAR | Status: AC

## 2019-06-28 MED ORDER — FOLIC ACID 1 MG PO TABS
1.0000 mg | ORAL_TABLET | Freq: Every day | ORAL | Status: DC
  Administered 2019-06-28: 1 mg
  Filled 2019-06-28: qty 1

## 2019-06-28 MED ORDER — ALPRAZOLAM 0.5 MG PO TABS: 0.5000 mg | ORAL_TABLET | Freq: Three times a day (TID) | ORAL | Status: DC | PRN

## 2019-06-28 MED ORDER — PRO-STAT SUGAR FREE PO LIQD
30.0000 mL | Freq: Three times a day (TID) | ORAL | Status: DC
  Administered 2019-06-28: 30 mL

## 2019-06-28 MED ORDER — CITALOPRAM HYDROBROMIDE 20 MG PO TABS
20.0000 mg | ORAL_TABLET | Freq: Every day | ORAL | Status: DC
  Administered 2019-06-28: 13:00:00 20 mg
  Filled 2019-06-28: qty 1

## 2019-06-28 MED ORDER — LORAZEPAM 2 MG/ML IJ SOLN
2.0000 mg | Freq: Once | INTRAMUSCULAR | Status: AC
  Administered 2019-06-28: 2 mg via INTRAVENOUS

## 2019-06-28 MED ORDER — CHLORHEXIDINE GLUCONATE 0.12% ORAL RINSE (MEDLINE KIT)
15.0000 mL | Freq: Two times a day (BID) | OROMUCOSAL | Status: DC
  Administered 2019-06-28 – 2019-06-29 (×3): 15 mL via OROMUCOSAL

## 2019-06-28 MED ORDER — IBUPROFEN 100 MG/5ML PO SUSP: 200.0000 mg | Freq: Four times a day (QID) | ORAL | Status: DC | PRN

## 2019-06-28 MED ORDER — MORPHINE SULFATE (PF) 2 MG/ML IV SOLN
INTRAVENOUS | Status: AC
  Filled 2019-06-28: qty 1

## 2019-06-28 MED ORDER — FENTANYL 2500MCG IN NS 250ML (10MCG/ML) PREMIX INFUSION
0.0000 ug/h | INTRAVENOUS | Status: DC
  Administered 2019-06-28 (×2): 250 ug/h via INTRAVENOUS
  Administered 2019-06-28: 25 ug/h via INTRAVENOUS
  Administered 2019-06-29: 400 ug/h via INTRAVENOUS
  Filled 2019-06-28 (×4): qty 250

## 2019-06-28 MED ORDER — CARISOPRODOL 350 MG PO TABS: 350.0000 mg | ORAL_TABLET | Freq: Four times a day (QID) | ORAL | Status: DC | PRN

## 2019-06-28 MED ORDER — FENTANYL CITRATE (PF) 100 MCG/2ML IJ SOLN: 25.0000 ug | INTRAMUSCULAR | Status: DC | PRN

## 2019-06-28 MED ORDER — GABAPENTIN 300 MG PO CAPS
300.0000 mg | ORAL_CAPSULE | Freq: Every day | ORAL | Status: DC
  Administered 2019-06-28: 11:00:00 300 mg
  Filled 2019-06-28: qty 1

## 2019-06-28 MED ORDER — ADULT MULTIVITAMIN W/MINERALS CH
1.0000 | ORAL_TABLET | Freq: Every day | ORAL | Status: DC
  Administered 2019-06-28: 1
  Filled 2019-06-28: qty 1

## 2019-06-28 MED ORDER — PANTOPRAZOLE SODIUM 40 MG PO PACK
40.0000 mg | PACK | Freq: Every day | ORAL | Status: DC
  Filled 2019-06-28: qty 20

## 2019-06-28 MED ORDER — POTASSIUM CHLORIDE 20 MEQ/15ML (10%) PO SOLN
40.0000 meq | Freq: Every day | ORAL | Status: DC
  Administered 2019-06-28: 40 meq
  Filled 2019-06-28: qty 30

## 2019-06-28 MED ORDER — ORAL CARE MOUTH RINSE
15.0000 mL | OROMUCOSAL | Status: DC
  Administered 2019-06-28 – 2019-06-29 (×11): 15 mL via OROMUCOSAL

## 2019-06-28 MED ORDER — GLYCOPYRROLATE 0.2 MG/ML IJ SOLN: 0.2000 mg | INTRAMUSCULAR | Status: DC | PRN

## 2019-06-28 MED ORDER — ASPIRIN 325 MG PO TABS
325.0000 mg | ORAL_TABLET | Freq: Every day | ORAL | Status: DC
  Administered 2019-06-28: 14:00:00 325 mg
  Filled 2019-06-28: qty 1

## 2019-06-28 MED ORDER — ROSUVASTATIN CALCIUM 20 MG PO TABS
40.0000 mg | ORAL_TABLET | Freq: Every day | ORAL | Status: DC
  Administered 2019-06-28: 40 mg
  Filled 2019-06-28: qty 2

## 2019-06-28 MED ORDER — MIDAZOLAM HCL 2 MG/2ML IJ SOLN
2.0000 mg | INTRAMUSCULAR | Status: DC | PRN
  Administered 2019-06-29: 2 mg via INTRAVENOUS
  Filled 2019-06-28: qty 2

## 2019-06-28 MED ORDER — THIAMINE HCL 100 MG PO TABS: 100.0000 mg | ORAL_TABLET | Freq: Every day | ORAL | Status: DC

## 2019-06-28 MED ORDER — MORPHINE SULFATE (PF) 2 MG/ML IV SOLN
2.0000 mg | Freq: Once | INTRAVENOUS | Status: AC
  Administered 2019-06-28: 2 mg via INTRAVENOUS

## 2019-06-28 MED ORDER — ETOMIDATE 2 MG/ML IV SOLN
30.0000 mg | Freq: Once | INTRAVENOUS | Status: AC
  Administered 2019-06-28: 02:00:00 30 mg via INTRAVENOUS
  Filled 2019-06-28: qty 20

## 2019-06-28 MED ORDER — AMITRIPTYLINE HCL 50 MG PO TABS: 150.0000 mg | ORAL_TABLET | Freq: Every day | ORAL | Status: DC

## 2019-06-28 MED ORDER — VITAL AF 1.2 CAL PO LIQD
1000.0000 mL | ORAL | Status: DC
  Administered 2019-06-28: 13:00:00 1000 mL

## 2019-06-28 NOTE — Progress Notes (Signed)
CRITICAL VALUE ALERT  Critical Value:  pCO2 82.6  Date & Time Notied:  06/28/2019 0400  Provider Notified: Warren Lacy

## 2019-06-28 NOTE — Procedures (Signed)
Intubation Procedure Note Howard Fox [id number] 25-Aug-1960  Procedure: Intubation Indications: Respiratory insufficiency/ hypoxia   Procedure Details Consent: Risks of procedure as well as the alternatives and risks of each were explained to the (patient/caregiver).  Consent for procedure obtained. Time Out: Verified patient identification, verified procedure, site/side was marked, verified correct patient position, special equipment/implants available, medications/allergies/relevent history reviewed, required imaging and test results available.  Performed  Presedated with fentanyl 100 mcg , etomidate 30 mg, and rocuronium 100 mg  Maximum sterile technique was used including cap, gloves, gown, hand hygiene and mask.  MAC and 4  Grade 1 view.  ETT 8.0 placed at 25 cm at lip and secured.  OGT placed as well with glidescope.   Evaluation Hemodynamic Status: BP stable throughout; O2 sats: stable throughout Patient's Current Condition: stable Complications: No apparent complications Patient did tolerate procedure well. Chest X-ray ordered to verify placement.  CXR: pending.    Kennieth Rad, MSN, AGACNP-BC [facility] 06/28/2019, 2:09 AM

## 2019-06-28 NOTE — Progress Notes (Signed)
[facility] Progress Note Patient Name: Howard Fox DOB: [birth year] MRN: [id number]   Date of Service  06/28/2019  HPI/Events of Note  ABG on 90%/PRVC 16/TV 460/P 5 = 7.235/82.6/91.0  eICU Interventions  Will order: 1. Increase PRVC rate to 20.  2 Repeat ABG at 6 AM.     Intervention Category Major Interventions: Acid-Base disturbance - evaluation and management;Respiratory failure - evaluation and management  Anara Cowman,Nollie Cornelia Copa 06/28/2019, 4:36 AM

## 2019-06-28 NOTE — Progress Notes (Signed)
[facility] Progress Note Patient Name: MARCELLAS MASCIA DOB: 04-14-1961 MRN: [id number]   Date of Service  06/28/2019  HPI/Events of Note  Hypoxia - Patient desaturated into the 70's. RR = 53. Now on BiPAP.  eICU Interventions  Will ask ground team to evaluate at bedside.      Intervention Category Major Interventions: Hypoxemia - evaluation and management  Makayleigh Poliquin,Gregrey Eugene 06/28/2019, 12:47 AM

## 2019-06-28 NOTE — Progress Notes (Addendum)
NAME:  Howard Fox, MRN:  [id number], DOB:  10-30-1960, LOS: 6 ADMISSION DATE:  06/03/2019, CONSULTATION DATE:  06/23/2019 REFERRING MD:  [facility] hospitalist team, CHIEF COMPLAINT:  Respiratory failure  Brief History   59 yo gentleman with history of emphysema, HTN, HLD, DM2 chronic pain and history of IVDU with vertebral osteomyelitis on suppressive chronic doxycycline. Recently discharged on 1/20 for MRSA bacteremia and respiratory failure. Was discharged on 3LNC and vancomycin with prednisone taper as well as lasix BID.Re-presented on 1/30 after found hypoxic in bathtub at home. Mother called EMS. Was admitted to [facility] and then progressively more hypoxemic and transferred to ICU.   Consults:  PCCM  Procedures:   Significant Diagnostic Tests:  Echo 1/31 1. Left ventricular ejection fraction, by visual estimation, is 55 to  60%. The left ventricle has normal function. There is mildly increased  left ventricular hypertrophy.  2. The left ventricle has no regional wall motion abnormalities.  3. Global right ventricle has moderately reduced systolic function.The  right ventricular size is mildly enlarged. Right vetricular wall thickness  was not assessed.  4. Poor acoustic windows No true apical view. RV dysfunction as noted.  Review of echo images from Jan 2021, RVEF appeared down in that as well.  5. The mitral valve is normal in structure. No evidence of mitral valve  regurgitation.  6. The tricuspid valve is normal in structure.  7. The tricuspid valve is normal in structure. Tricuspid valve  regurgitation is mild.  8. The aortic valve is tricuspid. Aortic valve regurgitation is not  visualized.  9. The pulmonic valve was grossly normal. Pulmonic valve regurgitation is  not visualized.  10. Aortic dilatation noted.  11. 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  12. Moderately elevated pulmonary artery systolic pressure.  13. The inferior vena cava is  normal in size with greater than 50%  respiratory variability, suggesting right atrial pressure of 3 mmHg.   Micro Data:  Covid PCR 1/30 > negative Blood culture 1/30 > COVID PCR 1/31 >negative  Antimicrobials:  Cefepime 1/30 >2/3 Vancomycin 1/30 >  Interim history/subjective:  Intubated overnght for worsening hypoxemia and respiratory distress.   Objective   Blood pressure 111/80, pulse (!) 108, temperature 98.6 F (37 C), temperature source Axillary, resp. rate 20, height 6' (1.829 m), weight 103 kg, SpO2 95 %.    Vent Mode: PRVC FiO2 (%):  [90 %] 90 % Set Rate:  [16 bmp-20 bmp] 20 bmp Vt Set:  [460 mL] 460 mL PEEP:  [5 cmH20] 5 cmH20 Plateau Pressure:  [12 cmH20-31 cmH20] 22 cmH20   Intake/Output Summary (Last 24 hours) at 06/28/2019 0930 Last data filed at 06/28/2019 0800 Gross per 24 hour  Intake 943.5 ml  Output 1875 ml  Net -931.5 ml   Filed Weights   05/29/2019 1727  Weight: 103 kg    Examination: General: Resting comfortably and able to complete full sentences, on nonrebreather HENT: No scleral icterus Lungs: Clear to auscultation bilaterally, no wheezes or crackles, he has a few inspiratory squeaking noises Cardiovascular: tachycardic rate and regularrhythm Abdomen: Scaphoid, soft Extremities: No edema Neuro: AOx3, moves all 4 extremities, normal speech MSK: no rashes Lines: right PICC line  Assessment & Plan:  Acute hypoxic on chronic hypoxic respiratory failure -History of postsurgical changes related to left lower lobectomy and resection of multiple left-sided ribs with extensive surgical wires from trauma -progressive GGOs with background emphysema concerning for an acute interstitial pneumonia.  P: Intubated overnight. 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 Once/day methylprednisolone 60 mg.  Aldolase elevated, normal CK. Doubt anti-synthetase syndrome, but will send serologies.  RF factor elevated. Will await ANA.  Will proceed with bronchoscopy  today now that he is intubated.  Head of bed elevated 30 degrees. He is very flow hungry and challenging to ventilate. Discussed with RT at bedside multiple ventilator changes this morning to make him synchronous and comfortable.   Recent MRSA bacteremia with history of T-spine discitis and fusion -ID consulted on prior admission and recommended 6 weeks of IV vancomycin end date 07/17/2019 -Patient did not receive TEE prior to admission due to profound hypoxia and later development of significant anxiety -Patient follows infectious disease at baseline with most recent recommendation of doxycycline x1 year, it appears patient self discontinued antibiotics prior to first admission P: Continue IV vancomycin ID following peripherally   Bipolar disease  -Home medications include Celexa P: Continue home medications. Evaluated by psych. Not felt to be manic and does have capacity to make his own decisions.   Alcohol abuse, Anxiety HX of IVDU -Patient reports daily consumption of alcohol, will monitor for withdrawal.  -Reports no IVDU over the last 3 yrs P: Precedex for agitation and anxiety.  Thiamine. Multivitamin and folic acid replacement     HLD P: Continue statin   HX of Hepatitis C -Follows infectious disease, has remote history of IV drug use P: Supportive care  - s/p treatment  Chronic pain  -Home medications include oxycodone, gabapentin, and muscle relaxants  P; Continue home medications Close monitoring of mentation    Best practice:  Diet: npo will start tube feeds Pain/Anxiety/Delirium protocol (if indicated): fentanyl continuous. VAP protocol (if indicated): n/a DVT prophylaxis: lovenox GI prophylaxis: on PPI Glucose control: serum BG<180  Foley: will insert today Mobility: OOB with assist Code Status: Full Code Family Communication: mother updated this morning over the phone. She is 41 and lives alone at home. She is not sure she can come in. She is  wondering if we should "let Markhi go." she has made him DNR and would like to talk more about the details of what happens next. I will have palliative care reach out to her by phone today.  Will hold off on bronchoscopy.  Disposition: ICU   Labs   CBC: Recent Labs  Lab 06/04/2019 1730 06/10/2019 1737 06/23/19 0952 06/24/19 0400 06/28/19 0332  WBC  --  29.4* 19.8* 20.4*  --   NEUTROABS  --  28.2* 18.2* 18.7*  --   HGB 13.9 13.7 13.1 12.3* 17.0  HCT 41.0 42.3 40.4 38.4* 50.0  MCV  --  88.3 87.4 88.1  --   PLT  --  404* 374 323  --     Basic Metabolic Panel: Recent Labs  Lab 05/30/2019 1737 06/23/19 0952 06/24/19 0400 06/26/19 0956 06/28/19 0332  NA 138 141 138 137 136  K 3.9 4.1 4.0 3.9 4.8  CL 101 103 99 96*  --   CO2 25 25 28 28   --   GLUCOSE 147* 114* 137* 145*  --   BUN 18 23* 25* 24*  --   CREATININE 0.98 0.78 0.71 0.74  --   CALCIUM 8.9 9.1 9.2 9.4  --   MG  --  1.9  --   --   --    GFR: Estimated Creatinine Clearance: 125 mL/min (by C-G formula based on SCr of 0.74 mg/dL). Recent Labs  Lab 06/03/2019 1737  06/01/2019 1951 06/23/19 0952 06/24/19 0400 06/25/19 0441  PROCALCITON 0.33  --  1.58 0.95 0.58  WBC 29.4*  --  19.8* 20.4*  --   LATICACIDVEN 2.9* 1.2  --   --   --     Liver Function Tests: Recent Labs  Lab 06/21/2019 1737 06/23/19 0952  AST 38 36  ALT 61* 56*  ALKPHOS 126 122  BILITOT 0.3 0.7  PROT 6.8 6.9  ALBUMIN 2.3* 2.3*   No results for input(s): LIPASE, AMYLASE in the last 168 hours. No results for input(s): AMMONIA in the last 168 hours.  ABG    Component Value Date/Time   PHART 7.253 (L) 06/28/2019 0700   PCO2ART 76.8 (HH) 06/28/2019 0700   PO2ART 110 (H) 06/28/2019 0700   HCO3 32.9 (H) 06/28/2019 0700   TCO2 38 (H) 06/28/2019 0332   O2SAT 96.7 06/28/2019 0700     Coagulation Profile: Recent Labs  Lab 06/23/2019 1737  INR 1.2    Cardiac Enzymes: Recent Labs  Lab 06/26/19 1357  CKTOTAL 19*    HbA1C: Hgb A1c MFr Bld    Date/Time Value Ref Range Status  10/25/2018 10:10 AM 5.6 <5.7 % of total Hgb Final    Comment:    For the purpose of screening for the presence of diabetes: . <5.7%       Consistent with the absence of diabetes 5.7-6.4%    Consistent with increased risk for diabetes             (prediabetes) > or =6.5%  Consistent with diabetes . This assay result is consistent with a decreased risk of diabetes. . Currently, no consensus exists regarding use of hemoglobin A1c for diagnosis of diabetes in children. . According to American Diabetes Association (ADA) guidelines, hemoglobin A1c <7.0% represents optimal control in non-pregnant diabetic patients. Different metrics may apply to specific patient populations.  Standards of Medical Care in Diabetes(ADA). Marland Kitchen   10/08/2018 05:17 AM 5.7 (H) 4.8 - 5.6 % Final    Comment:    (NOTE) Pre diabetes:          5.7%-6.4% Diabetes:              >6.4% Glycemic control for   <7.0% adults with diabetes     CBG: Recent Labs  Lab 06/24/19 1922 06/28/19 0733  GLUCAP 131* 152*    Critical care time:   The patient is critically ill with multiple organ systems failure and requires high complexity decision making for assessment and support, frequent evaluation and titration of therapies, application of advanced monitoring technologies and extensive interpretation of multiple databases.   Critical Care Time devoted to patient care services described in this note is 48 minutes. This time reflects the time of my personal involvement. This critical care time does not reflect separately billable procedures or procedure time, teaching time or supervisory time of PA/NP/Med student/Med Resident etc but could involve care discussion time.  [facility] 06/28/2019 9:30 AM  Pager: [phone] After hours pager: [phone]

## 2019-06-28 NOTE — Progress Notes (Signed)
Patient ID: MARVEN CORNER, male   DOB: 12/09/1960, 59 y.o.   MRN: [id number]  This NP visited patient at the bedside as a follow up to  [place], for palliative medicine needs and emotional support.    Patient's mother is at the bedside.  Today patient tells me that he "feels better and is in a much better headspace".  He is pleasant and cooperative.  Patient is off high flow oxygen and he is maintaining O2 sats on a Ventimask.  Both patient and his mother verbalized their hope for continued improvement and ultimately return home.  I have raised awareness to the seriousness of his multiple comorbidities, and the importance of advanced care planning.   Discussed with patient the importance of continued conversation with his family and the  medical providers regarding overall plan of care and treatment options,  ensuring decisions are within the context of the patients values and GOCs.  Questions and concerns addressed   Discussed with Dr Shearon Stalls  Total time spent on the unit was 20 minutes  This nurse practitioner informed  the patient/family and the attending that I will be out of the hospital until Monday morning.  If the patient is still hospitalized I will follow-up at that time.  Call palliative medicine team phone # [phone] with questions or concerns.  Greater than 50% of the time was spent in counseling and coordination of care  Wadie Lessen NP  Palliative Medicine Team Team Phone # [phone] Pager [phone]

## 2019-06-28 NOTE — Progress Notes (Signed)
Pharmacy Antibiotic Note  Howard Fox is a 59 y.o. male admitted on 06/16/2019 with pneumonia.Pharmacy has been consulted for Cefepime and Vancomycin dosing. Patient was recently discharged on Vancomycin also for MRSA bacteremia and planned for 6 weeks of IV vancomycin. Apt to follow up with ID feb 16th.  Will monitor vanc levels weekly unless renal function worsens. Next set of levels 2/9.  Height: 6' (182.9 cm) Weight: 227 lb (103 kg) IBW/kg (Calculated) : 77.6  Temp (24hrs), Avg:98.5 F (36.9 C), Min:98 F (36.7 C), Max:99 F (37.2 C)  Recent Labs  Lab 06/02/2019 1737 06/02/2019 1951 06/23/19 0952 06/24/19 0400 06/25/19 0115 06/25/19 1101 06/26/19 0956  WBC 29.4*  --  19.8* 20.4*  --   --   --   CREATININE 0.98  --  0.78 0.71  --   --  0.74  LATICACIDVEN 2.9* 1.2  --   --   --   --   --   VANCOTROUGH  --   --   --   --   --  16  --   VANCOPEAK  --   --   --   --  50*  --   --     Estimated Creatinine Clearance: 125 mL/min (by C-G formula based on SCr of 0.74 mg/dL).    Allergies  Allergen Reactions  . Codeine Itching  . Dilaudid [Hydromorphone Hcl] Itching  . Shellfish Allergy Diarrhea and Nausea And Vomiting    Antimicrobials this admission: 1/30 Cefepime >> 2/3 1/30 Vancomycin >>   Dose adjustments this admission: Vancomycin 1500 mg q12h pk/tr 50/19 --> 750mg  q12h on 2/2  Microbiology results: 1/30 BCx: ngtd  Plan:  - vancomycin 750 IV q12h  - levels on 2/9  Thank you for allowing pharmacy to be a part of this patient's care.[place] PharmD. BCPS 06/28/2019 1:20 PM

## 2019-06-28 NOTE — Discharge Instructions (Signed)
Acute Respiratory Failure, Adult  Acute respiratory failure occurs when there is not enough oxygen passing from your lungs to your body. When this happens, your lungs have trouble removing carbon dioxide from the blood. This causes your blood oxygen level to drop too low as carbon dioxide builds up. Acute respiratory failure is a medical emergency. It can develop quickly, but it is temporary if treated promptly. Your lung capacity, or how much air your lungs can hold, may improve with time, exercise, and treatment. What are the causes? There are many possible causes of acute respiratory failure, including:  Lung injury.  Chest injury or damage to the ribs or tissues near the lungs.  Lung conditions that affect the flow of air and blood into and out of the lungs, such as pneumonia, acute respiratory distress syndrome, and cystic fibrosis.  Medical conditions, such as strokes or spinal cord injuries, that affect the muscles and nerves that control breathing.  Blood infection (sepsis).  Inflammation of the pancreas (pancreatitis).  A blood clot in the lungs (pulmonary embolism).  A large-volume blood transfusion.  Burns.  Near-drowning.  Seizure.  Smoke inhalation.  Reaction to medicines.  Alcohol or drug overdose. What increases the risk? This condition is more likely to develop in people who have:  A blocked airway.  Asthma.  A condition or disease that damages or weakens the muscles, nerves, bones, or tissues that are involved in breathing.  A serious infection.  A health problem that blocks the unconscious reflex that is involved in breathing, such as hypothyroidism or sleep apnea.  A lung injury or trauma. What are the signs or symptoms? Trouble breathing is the main symptom of acute respiratory failure. Symptoms may also include:  Rapid breathing.  Restlessness or anxiety.  Skin, lips, or fingernails that appear blue (cyanosis).  Rapid heart  rate.  Abnormal heart rhythms (arrhythmias).  Confusion or changes in behavior.  Tiredness or loss of energy.  Feeling sleepy or having a loss of consciousness. How is this diagnosed? Your health care provider can diagnose acute respiratory failure with a medical history and physical exam. During the exam, your health care provider will listen to your heart and check for crackling or wheezing sounds in your lungs. Your may also have tests to confirm the diagnosis and determine what is causing respiratory failure. These tests may include:  Measuring the amount of oxygen in your blood (pulse oximetry). The measurement comes from a small device that is placed on your finger, earlobe, or toe.  Other blood tests to measure blood gases and to look for signs of infection.  Sampling your cerebral spinal fluid or tracheal fluid to check for infections.  Chest X-ray to look for fluid in spaces that should be filled with air.  Electrocardiogram (ECG) to look at the heart's electrical activity. How is this treated? Treatment for this condition usually takes places in a hospital intensive care unit (ICU). Treatment depends on what is causing the condition. It may include one or more treatments until your symptoms improve. Treatment may include:  Supplemental oxygen. Extra oxygen is given through a tube in the nose, a face mask, or a hood.  A device such as a continuous positive airway pressure (CPAP) or bi-level positive airway pressure (BiPAP or BPAP) machine. This treatment uses mild air pressure to keep the airways open. A mask or other device will be placed over your nose or mouth. A tube that is connected to a motor will deliver oxygen through the   mask.  Ventilator. This treatment helps move air into and out of the lungs. This may be done with a bag and mask or a machine. For this treatment, a tube is placed in your windpipe (trachea) so air and oxygen can flow to the lungs.  Extracorporeal  membrane oxygenation (ECMO). This treatment temporarily takes over the function of the heart and lungs, supplying oxygen and removing carbon dioxide. ECMO gives the lungs a chance to recover. It may be used if a ventilator is not effective.  Tracheostomy. This is a procedure that creates a hole in the neck to insert a breathing tube.  Receiving fluids and medicines.  Rocking the bed to help breathing. Follow these instructions at home:  Take over-the-counter and prescription medicines only as told by your health care provider.  Return to normal activities as told by your health care provider. Ask your health care provider what activities are safe for you.  Keep all follow-up visits as told by your health care provider. This is important. How is this prevented? Treating infections and medical conditions that may lead to acute respiratory failure can help prevent the condition from developing. Contact a health care provider if:  You have a fever.  Your symptoms do not improve or they get worse. Get help right away if:  You are having trouble breathing.  You lose consciousness.  Your have cyanosis or turn blue.  You develop a rapid heart rate.  You are confused. These symptoms may represent a serious problem that is an emergency. Do not wait to see if the symptoms will go away. Get medical help right away. Call your local emergency services (911 in the [place] Do not drive yourself to the hospital. This information is not intended to replace advice given to you by your health care provider. Make sure you discuss any questions you have with your health care provider. Document Revised: 04/21/2017 Document Reviewed: 11/25/2015 Elsevier Patient Education  2020 [employer]  

## 2019-06-28 NOTE — Progress Notes (Signed)
Daily Progress Note   Patient Name: Howard Fox       Date: 06/28/2019 DOB: 1961/05/10  Age: 59 y.o. MRN#: [id number] Attending Physician: Spero Geralds, MD Primary Care Physician: Unk Pinto, MD Admit Date: 05/30/2019  Reason for Consultation/Follow-up: Establishing goals of care  Subjective: Sedated on ventilator, appears comfortable, mother at bedside  Length of Stay: 6  Current Medications: Scheduled Meds:  . amitriptyline  150 mg Per Tube QHS  . aspirin  325 mg Per Tube Daily  . budesonide (PULMICORT) nebulizer solution  0.5 mg Nebulization BID  . chlorhexidine gluconate (MEDLINE KIT)  15 mL Mouth Rinse BID  . Chlorhexidine Gluconate Cloth  6 each Topical Daily  . citalopram  20 mg Per Tube Q lunch  . enoxaparin (LOVENOX) injection  40 mg Subcutaneous Q24H  . feeding supplement (PRO-STAT SUGAR FREE 64)  30 mL Per Tube TID  . folic acid  1 mg Per Tube Daily  . furosemide  80 mg Intravenous Daily  . gabapentin  300 mg Per Tube Daily  . ipratropium-albuterol  3 mL Nebulization Q6H  . mouth rinse  15 mL Mouth Rinse 10 times per day  . methylPREDNISolone (SOLU-MEDROL) injection  60 mg Intravenous Q24H  . multivitamin with minerals  1 tablet Per Tube Daily  . pantoprazole sodium  40 mg Per Tube Daily  . potassium chloride  40 mEq Per Tube Daily  . rosuvastatin  40 mg Per Tube Daily  . [START ON Jul 09, 2019] thiamine  100 mg Per Tube Daily    Continuous Infusions: . sodium chloride Stopped (06/27/19 1713)  . feeding supplement (VITAL AF 1.2 CAL) 1,000 mL (06/28/19 1259)  . fentaNYL infusion INTRAVENOUS 250 mcg/hr (06/28/19 1251)  . vancomycin Stopped (06/28/19 0720)    PRN Meds: sodium chloride, albuterol, ALPRAZolam, carisoprodol, fentaNYL (SUBLIMAZE) injection,  ibuprofen  Physical Exam Constitutional:      General: He is not in acute distress.    Interventions: He is sedated and intubated.  Cardiovascular:     Rate and Rhythm: Regular rhythm. Tachycardia present.  Pulmonary:     Effort: He is intubated.             Vital Signs: BP 105/80   Pulse (!) 106   Temp 99 F (37.2 C) (Axillary)   Resp (!) 24   Ht 6' (1.829 m)   Wt 103 kg   SpO2 93%   BMI 30.79 kg/m  SpO2: SpO2: 93 % O2 Device: O2 Device: Ventilator O2 Flow Rate: O2 Flow Rate (L/min): 15 L/min  Intake/output summary:   Intake/Output Summary (Last 24 hours) at 06/28/2019 1510 Last data filed at 06/28/2019 0800 Gross per 24 hour  Intake 913.29 ml  Output 775 ml  Net 138.29 ml   LBM: Last BM Date: 06/25/19 Baseline Weight: Weight: 103 kg Most recent weight: Weight: 103 kg       Palliative Assessment/Data: PPS 30%      Patient Active Problem List   Diagnosis Date Noted  . Palliative care by specialist   . DNR (do not resuscitate) discussion   . Acute respiratory failure with hypoxia ([place]) 06/15/2019  . Thrombocytosis ([place]) 06/01/2019  . Community acquired pneumonia  06/03/2019  . Fever 06/03/2019  . Tachypnea 06/03/2019  . Dyspnea 06/03/2019  . Chronic pain syndrome 06/03/2019  . Pneumonia 06/03/2019  . Back pain 04/04/2019  . Aortic atherosclerosis ([place]) 04/04/2019  . H/O intravenous drug use in remission 02/21/2019  . Low blood pressure 02/20/2019  . Postoperative anemia due to acute blood loss 02/12/2019  . Vertebral osteomyelitis ([place]) 10/25/2018  . Anxiety 10/25/2018  . History of cocaine use 10/24/2018  . Marijuana smoker 10/24/2018  . MRSA bacteremia 10/09/2018  . Diskitis 10/08/2018  . History of prediabetes 06/26/2018  . Hepatitis C, chronic ([place]) 04/01/2015  . Tobacco use disorder 04/01/2015  . Other abnormal glucose 12/10/2014  . Obesity (BMI 30.0-34.9) 12/10/2014  . Medication management 12/03/2013  . Essential hypertension 04/25/2013  .  Hyperlipidemia, mixed 04/25/2013  . Vitamin D deficiency 04/25/2013  . Bipolar disorder ([place]) 02/19/2007    Palliative Care Assessment & Plan   HPI: Howard Fox is a 59 y.o. male with multiple medical problems including bipolar disease, COPD, chronic T10-T11 osteo/discitis previously on chronic doxy until December 2020, chronic pain syndrome, history of IV drug use use (sober 3 years), hep C status post treatment, who was previously hospitalized 06/03/2019-06/12/2019 with hypoxic respiratory failure secondary to pneumonia/pulmonary edema/COPD exacerbation was found to have MRSA bacteremia thought secondary to T-spine discitis.  Patient was readmitted on 06/20/2019 with hypoxic respiratory failure after being found unresponsive in the bathtub at home.  Chest x-ray showed progressive multifocal pneumonia.  A CODE BLUE was called on 06/23/2019 due to severe hypoxia after patient was refusing to wear oxygen.  He was transferred to the ICU.  Palliative care was consulted to help address goals.  Assessment: Called to bedside by RN - mother at the bedside, tearful about the situation.   Participated in life review - she shares about Howard Fox life and tells me about the things he enjoys. She tells me how close they are - he has lived with her for a while. She also tells me about his struggles with his illness.   We reviewed her conversation with Dr. Shearon Stalls - Ms. Rosencrans has a good understanding of the situation. She shares that she has been preparing herself for Howard Fox death as she is aware of how ill he is. He had written down for her what sort of arrangements he wanted after his death. She shares that she feels he has suffered long enough and she does not want to prolong suffering any further; however, she would like to prepare herself before moving forward with terminal extubation. She requests to proceed with extubation 2/6 at 11 am.   She asks about measures used to keep Howard Fox comfortable and  what will happen when Howard Fox is freed from mechanical ventilation - we discussed this in detail. Discussed medications used to provide comfort.   We discussed transitioning care today to focus on comfort - no further lab draws, tube feedings, or medications not necessary to ensure comfort.  Ms. Belshe agrees to this.  Discussed situation with Dr. Shearon Stalls and his mother's request for extubation tomorrow, comfort measures today - Dr. Shearon Stalls agrees this is appropriate for Mr Dolores.  Recommendations/Plan:  Terminal extubation 2/6 11 am - mother plans to be at bedside (palliative team member Sharyn Lull, NP plans to be at bedside)  No further labs, tube feeding, or medications not needed to ensure comfort  Continue fentanyl drip, will add PRN versed  Goals of Care and Additional Recommendations:  Limitations on  Scope of Treatment: Full Comfort Care  Code Status:  DNR  Prognosis:   Hours - Days  Discharge Planning:  Anticipated Hospital Death  Care plan was discussed with Dr. Shearon Stalls, ICU RN, patient's mother  Thank you for allowing the Palliative Medicine Team to assist in the care of this patient.   Total Time 15:00-16:10 70 minutes Prolonged Time Billed  yes      Greater than 50%  of this time was spent counseling and coordinating care related to the above assessment and plan.  Juel Burrow, DNP, [facility] Palliative Medicine Team Team Phone # [phone]  Pager [phone]

## 2019-06-28 NOTE — Progress Notes (Addendum)
Initial Nutrition Assessment RD working remotely.  DOCUMENTATION CODES:   Not applicable  INTERVENTION:    Vital AF 1.2 at 60 ml/h (1440 ml per day)   Pro-stat 30 ml TID   Provides 2028 kcal, 153 gm protein, 1168 ml free water daily  NUTRITION DIAGNOSIS:   Inadequate oral intake related to inability to eat as evidenced by NPO status.  GOAL:   Patient will meet greater than or equal to 90% of their needs  MONITOR:   Vent status, TF tolerance, Labs  REASON FOR ASSESSMENT:   Ventilator, Consult Enteral/tube feeding initiation and management  ASSESSMENT:   59 yo male admitted after being found hypoxic in the bathtub at home. Required intubation 2/5. PMH includes daily alcohol use, emphysema, HTN, HLD, DM2, chronic pain, IVDU (> 3 years ago), vertebral osteomyelitis, shellfish allergy.   Prior to intubation last night, patient was receiving a dysphagia 2 diet with thin liquids, consuming 50-100% of meals. SLP was following.   Patient is currently intubated on ventilator support MV: 9.2 L/min Temp (24hrs), Avg:98.2 F (36.8 C), Min:97.6 F (36.4 C), Max:98.6 F (37 C)   Labs reviewed.  CBG's: 152  Medications reviewed and include folic acid, lasix, solu-medrol, MVI with minerals, KCl, thiamine.    NUTRITION - FOCUSED PHYSICAL EXAM:  unable to complete  Diet Order:   Diet Order            Diet NPO time specified  Diet effective now              EDUCATION NEEDS:   Not appropriate for education at this time  Skin:  Skin Assessment: Reviewed RN Assessment  Last BM:  2/2  Height:   Ht Readings from Last 1 Encounters:  05/31/2019 6' (1.829 m)    Weight:   Wt Readings from Last 1 Encounters:  06/17/2019 103 kg    Ideal Body Weight:  80.9 kg  BMI:  Body mass index is 30.79 kg/m.  Estimated Nutritional Needs:   Kcal:  2040  Protein:  150-160 gm  Fluid:  >/= 2 L    Molli Barrows, RD, LDN, CNSC Contact information can be found in  Amion.

## 2019-06-28 NOTE — Progress Notes (Signed)
[place] Progress Note Patient Name: Howard Fox DOB: 06-05-1960 MRN: [id number]   Date of Service  06/28/2019  HPI/Events of Note  Agitation   eICU Interventions  Will order: 1. Fentanyl 25-100 mcg IV Q 2 hours PRN.      Intervention Category Major Interventions: Delirium, psychosis, severe agitation - evaluation and management  Alechia Lezama,Deonta Eugene 06/28/2019, 3:43 AM

## 2019-06-28 NOTE — Progress Notes (Signed)
RT assessed patient for O2 desat. Patient placed back on heated high flow with flow of 30L and 100% with NRB over top. Patient work of breathing very increased. Patient subsequently placed on BiPAP 20/8 100%. Work of breathing still elevated. MD at bedside. Patient taken off BiPAP and placed back on heated high flow of 50L and 100%. MD wants patient off BiPAP so he can try to cough up sputum. Meds given by RN to help with work of breathing. Will reassess after meds. RT will continue to monitor.

## 2019-06-28 NOTE — Plan of Care (Signed)
Plan of care   Contacted by e link concerning patient with increased RR and WOB. This was noted after he had a coughing spell. Patietn denies anxiety. Is mentating appropriately and able to communicate but having dyspea with this. BP with elevated diastolic by normal systolic. Not very concerned for flash pulmonary edema at this time. It seems anxiety driven Reportedly patient has had this in the past but unsure if the severity of his WOB was to this extent. Of note patient has been on precedex drip previous to this.   Respiratroy distress -CXR -Diuresis -As patient having secretions will take off bipap transienty to optiflow -Will treat anxiety with morphine if this does not work will give ativan -If still having increased WOB and no other apparent etiology will consider intubation  -Has just finished course of abx with cefepime and vancomycin

## 2019-06-29 DIAGNOSIS — Z515 Encounter for palliative care: Secondary | ICD-10-CM

## 2019-06-29 MED ORDER — MORPHINE SULFATE (PF) 2 MG/ML IV SOLN: 2.0000 mg | INTRAVENOUS | Status: DC | PRN

## 2019-06-29 MED ORDER — MIDAZOLAM 50MG/50ML (1MG/ML) PREMIX INFUSION
0.5000 mg/h | INTRAVENOUS | Status: DC
  Administered 2019-06-29: 11:00:00 0.5 mg/h via INTRAVENOUS
  Filled 2019-06-29: qty 50

## 2019-06-29 MED ORDER — MORPHINE 100MG IN NS 100ML (1MG/ML) PREMIX INFUSION
2.0000 mg/h | INTRAVENOUS | Status: DC
  Administered 2019-06-29: 2 mg/h via INTRAVENOUS
  Filled 2019-06-29: qty 100

## 2019-06-29 MED ORDER — ACETAMINOPHEN 650 MG RE SUPP: 325.0000 mg | Freq: Four times a day (QID) | RECTAL | Status: DC | PRN

## 2019-06-29 MED ORDER — HALOPERIDOL LACTATE 5 MG/ML IJ SOLN: 5.0000 mg | Freq: Four times a day (QID) | INTRAMUSCULAR | Status: DC | PRN

## 2019-06-29 MED ORDER — POLYVINYL ALCOHOL 1.4 % OP SOLN
1.0000 [drp] | OPHTHALMIC | Status: DC | PRN
  Filled 2019-06-29: qty 15

## 2019-06-29 MED ORDER — BIOTENE DRY MOUTH MT LIQD: 15.0000 mL | OROMUCOSAL | Status: DC | PRN

## 2019-06-29 MED ORDER — GLYCOPYRROLATE 0.2 MG/ML IJ SOLN: 0.4000 mg | INTRAMUSCULAR | Status: DC

## 2019-06-29 MED ORDER — MIDAZOLAM BOLUS VIA INFUSION
0.5000 mg | INTRAVENOUS | Status: DC | PRN
  Filled 2019-06-29: qty 1

## 2019-06-29 MED ORDER — ONDANSETRON HCL 4 MG/2ML IJ SOLN: 4.0000 mg | Freq: Four times a day (QID) | INTRAMUSCULAR | Status: DC | PRN

## 2019-06-29 MED ORDER — ACETAMINOPHEN 160 MG/5ML PO SOLN
650.0000 mg | ORAL | Status: DC | PRN
  Administered 2019-06-29 (×2): 650 mg
  Filled 2019-06-29 (×2): qty 20.3

## 2019-06-29 MED ORDER — BISACODYL 10 MG RE SUPP: 10.0000 mg | Freq: Every day | RECTAL | Status: DC | PRN

## 2019-07-09 ENCOUNTER — Inpatient Hospital Stay: Payer: Medicare Other | Admitting: Internal Medicine

## 2019-07-13 DIAGNOSIS — J189 Pneumonia, unspecified organism: Secondary | ICD-10-CM | POA: Diagnosis not present

## 2019-07-16 ENCOUNTER — Ambulatory Visit: Payer: Medicare Other | Admitting: Internal Medicine

## 2019-07-22 NOTE — Progress Notes (Signed)
Palliative Medicine Inpatient Follow Up Note  HPI: Per intensivist note --> 59 yo gentleman with history of emphysema, HTN, HLD, DM2 chronic pain and history of IVDU with vertebral osteomyelitis on suppressive chronic doxycycline. Recently discharged on 1/20 for MRSA bacteremia and respiratory failure. Was discharged on 3LNC and vancomycin with prednisone taper as well as lasix BID.Re-presented on 1/30 after found hypoxic in bathtub at home. Mother called EMS. Was admitted to [facility] and then progressively more hypoxemic and transferred to ICU.  As of 2/5 patients mother, Bonnita Nasuti decided to transition to comfort measures on 2/6 at [place] for compassionate extubation  Today's Discussion (07/25/2019): Chart reviewed. Met with patients bedside RN, Mahala Menghini and patients mother, Bonnita Nasuti at bedside. Bonnita Nasuti was clearly upset that her daughter had not yet arrived. We talked about calling her to verify she found her way alright. She did make it to the unit at 11:20 which alleviated some of the anxiety the Bonnita Nasuti was experiencing.   Met with patient and his family members at bedside. Explained what comfort oriented care will look like in the hospital. We discussed the medications that we will utilize to relieve dyspnea and anxiety. They all vocalized understanding. Explained that some people pass more quickly than others once all interventions are stopped.  Family stated that Mr. Schauf had suffered for sometime and they are ready for extubation. Orders placed, RT came to bedside. Offered support to family who plan to stay with patient for the time being.  Discussed  importance of continued conversation with family and their medical providers regarding bereavement and ongoing family needs.   Questions and concerns addressed   Vital Signs Vitals:   2019-07-25 0930 07-25-19 1000  BP: 121/79 114/78  Pulse: (!) 123 (!) 123  Resp: (!) 22 (!) 22  Temp:    SpO2: 100% 100%    Intake/Output Summary (Last 24 hours) at  07-25-19 1106 Last data filed at 2019-07-25 1000 Gross per 24 hour  Intake 1234 ml  Output 1190 ml  Net 44 ml   Last Weight  Most recent update: 05/25/2019  5:28 PM   Weight  103 kg (227 lb)          Gen:  Ill appearing gentleman intubated and sedated HEENT: dry mucous membranes CV: Irregular rate and rhythm, no murmurs rubs or gallops PULM: Intubated on mechanical ventilation ABD: soft/nontender  EXT: No edema  Neuro: Sedated  Recommendations and Plan:  Transition to comfort measures  Compassionate Extubation this afternoon  Comfort medications ordered as below  Chaplain consulted  Dyspnea:  - Morphine 32m/hr titratable   - Morphine 246mQ1H PRN Pain:  - Tylenol 65057mO/PR Q6H PRN  - Heat Pack PRN Fever:  - Tylenol as above Agitation:  - Haldol 5mg54m Q6H PRN Anxiety:  - Midazolam 0.5mg/52mtitratable  - Midazolam boluses PRN Nausea:  - Zofran 4mg P103mV Q6H PRN  Secretions:  - Glycopyrrolate 0.4mg IV102mH ATC Dry Eyes:  - Artificial Tears PRN Xerostomia:  - Biotene 15ml PR31m Chlorhexidine mouthwash  - BID oral care Urinary Retention:  - maintain foley  Constipation:  - Bisacodyl 10mg PR 80mQDay Spiritual:  - Chaplain consult  Time Spent: 65 Greater than 50% of the time was spent in counseling and coordination of care ______________________________________________________________________________________ Yoshiko Keleher [place] Cell Phone: [phone] secure chat with additional questions, if there is no response within 30 minutes please call the above phone number  Palliative Medicine Team providers are available by phone from 7am to 7pm daily and can be reached through the team cell phone.  Should this patient require assistance outside of these hours, please call the patient's attending physician.

## 2019-07-22 NOTE — Progress Notes (Signed)
[place] Progress Note Patient Name: Howard Fox DOB: 07-Dec-1960 MRN: [id number]   Date of Service  Jul 01, 2019  HPI/Events of Note  Notified of temp 103 Also informed that patient is now comfort measure only  eICU Interventions  Ordered Tylenol per OG prn fever     Intervention Category Intermediate Interventions: Arrhythmia - evaluation and management Minor Interventions: Routine modifications to care plan (e.g. PRN medications for pain, fever)  Shona Needles Stephanye [place] July 01, 2019, 4:38 AM

## 2019-07-22 NOTE — Progress Notes (Signed)
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 Recently discharged on 1/20 for MRSA bacteremia and respiratory failure. Was discharged on 3LNC and vancomycin with prednisone taper as well as lasix BID.Re-presented on 1/30 after found hypoxic in bathtub at home. Mother called EMS. Was admitted to [facility] and then progressively more hypoxemic and transferred to ICU.   Consults:  PCCM  Procedures:   Significant Diagnostic Tests:  Echo 1/31 1. Left ventricular ejection fraction, by visual estimation, is 55 to  60%. The left ventricle has normal function. There is mildly increased  left ventricular hypertrophy.  2. The left ventricle has no regional wall motion abnormalities.  3. Global right ventricle has moderately reduced systolic function.The  right ventricular size is mildly enlarged. Right vetricular wall thickness  was not assessed.  4. Poor acoustic windows No true apical view. RV dysfunction as noted.  Review of echo images from Jan 2021, RVEF appeared down in that as well.  5. The mitral valve is normal in structure. No evidence of mitral valve  regurgitation.  6. The tricuspid valve is normal in structure.  7. The tricuspid valve is normal in structure. Tricuspid valve  regurgitation is mild.  8. The aortic valve is tricuspid. Aortic valve regurgitation is not  visualized.  9. The pulmonic valve was grossly normal. Pulmonic valve regurgitation is  not visualized.  10. Aortic dilatation noted.  11. 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  12. Moderately elevated pulmonary artery systolic pressure.  13. The inferior vena cava is  normal in size with greater than 50%  respiratory variability, suggesting right atrial pressure of 3 mmHg.   Micro Data:  Covid PCR 1/30 > negative Blood culture 1/30 > COVID PCR 1/31 >negative  Antimicrobials:  Cefepime 1/30 >2/3 Vancomycin 1/30 >  Interim history/subjective:  Mother updated yesterday.  Plan is for palliative extubation this morning. Febrile overnight.   Objective   Blood pressure 114/80, pulse (!) 132, temperature (!) 103.3 F (39.6 C), temperature source Oral, resp. rate (!) 23, height 6' (1.829 m), weight 103 kg, SpO2 100 %.    Vent Mode: PRVC FiO2 (%):  [40 %-90 %] 70 % Set Rate:  [22 bmp] 22 bmp Vt Set:  [540 mL] 540 mL PEEP:  [5 cmH20] 5 cmH20 Plateau Pressure:  [18 cmH20-34 cmH20] 34 cmH20   Intake/Output Summary (Last 24 hours) at July 17, 2019 0901 Last data filed at 17-Jul-2019 0700 Gross per 24 hour  Intake 1121.09 ml  Output 1190 ml  Net -68.91 ml   Filed Weights   05/26/2019 1727  Weight: 103 kg    Examination: General: intubated, sedated HENT: No scleral icterus Lungs: Clear to auscultation bilaterally,no wheezes, inspiratory squwaks present. Cardiovascular: tachycardic rate and regularrhythm Abdomen: Scaphoid, soft Extremities: No edema Neuro: sedated but arousable - RAAS -1 MSK: no rashes Lines: right PICC line  Assessment & Plan:  Acute hypoxic on chronic hypoxic respiratory failure -History of postsurgical changes related to left lower lobectomy and resection of multiple left-sided ribs with extensive surgical wires from trauma -progressive GGOs with background emphysema concerning for an acute interstitial pneumonia.  This is not responding to antibiotics or steroids and I  have no further therapeutic options to improve this. He has been on high flow with non-rebreather for the past week without improvement.  P: Intubated Suspect this is progression of his ILD. Once/day methylprednisolone 60 mg.  Aldolase elevated, normal CK. Doubt  anti-synthetase syndrome, but will send serologies.  RF factor elevated. Will await ANA.  Holding off on bronchoscopy given low yield for infectious recovery given several days of broad spectrum abx and decision to move to comfort. Head of bed elevated 30 degrees.  Recent MRSA bacteremia with history of T-spine discitis and fusion -ID consulted on prior admission and recommended 6 weeks of IV vancomycin end date 07/17/2019 -Patient did not receive TEE prior to admission due to profound hypoxia and later development of significant anxiety -Patient follows infectious disease at baseline with most recent recommendation of doxycycline x1 year, it appears patient self discontinued antibiotics prior to first admission P: Continue IV vancomycin ID following peripherally   Bipolar disease  -Home medications include Celexa P: Continue home medications. Evaluated by psych. Not felt to be manic and does have capacity to make his own decisions.   Alcohol abuse, Anxiety HX of IVDU -Patient reports daily consumption of alcohol, will monitor for withdrawal.  -Reports no IVDU over the last 3 yrs P: Precedex for agitation and anxiety.  Thiamine. Multivitamin and folic acid replacement     HLD P: Continue statin   HX of Hepatitis C -Follows infectious disease, has remote history of IV drug use P: Supportive care  - s/p treatment  Chronic pain  -Home medications include oxycodone, gabapentin, and muscle relaxants  P; Continue home medications Close monitoring of mentation    Best practice:  Diet: npo will start tube feeds Pain/Anxiety/Delirium protocol (if indicated): fentanyl continuous. VAP protocol (if indicated): n/a DVT prophylaxis: lovenox GI prophylaxis: on PPI Glucose control: serum BG<180  Foley: will insert today Mobility: OOB with assist Code Status: Full Code Family Communication: will update mother at bedside today. Plan for palliative extubation. Disposition:  ICU   Labs   CBC: Recent Labs  Lab 06/13/2019 1730 05/28/2019 1737 06/23/19 0952 06/24/19 0400 06/28/19 0332  WBC  --  29.4* 19.8* 20.4*  --   NEUTROABS  --  28.2* 18.2* 18.7*  --   HGB 13.9 13.7 13.1 12.3* 17.0  HCT 41.0 42.3 40.4 38.4* 50.0  MCV  --  88.3 87.4 88.1  --   PLT  --  404* 374 323  --     Basic Metabolic Panel: Recent Labs  Lab 06/20/2019 1737 06/23/19 0952 06/24/19 0400 06/26/19 0956 06/28/19 0332  NA 138 141 138 137 136  K 3.9 4.1 4.0 3.9 4.8  CL 101 103 99 96*  --   CO2 25 25 28 28   --   GLUCOSE 147* 114* 137* 145*  --   BUN 18 23* 25* 24*  --   CREATININE 0.98 0.78 0.71 0.74  --   CALCIUM 8.9 9.1 9.2 9.4  --   MG  --  1.9  --   --   --    GFR: Estimated Creatinine Clearance: 125 mL/min (by C-G formula based on SCr of 0.74 mg/dL). Recent Labs  Lab 05/24/2019 1737 06/19/2019 1951 06/23/19 0952 06/24/19 0400 06/25/19 0441  PROCALCITON 0.33  --  1.58 0.95 0.58  WBC 29.4*  --  19.8* 20.4*  --   LATICACIDVEN 2.9* 1.2  --   --   --     Liver Function Tests: Recent Labs  Lab  06/23/2019 1737 06/23/19 0952  AST 38 36  ALT 61* 56*  ALKPHOS 126 122  BILITOT 0.3 0.7  PROT 6.8 6.9  ALBUMIN 2.3* 2.3*   No results for input(s): LIPASE, AMYLASE in the last 168 hours. No results for input(s): AMMONIA in the last 168 hours.  ABG    Component Value Date/Time   PHART 7.253 (L) 06/28/2019 0700   PCO2ART 76.8 (HH) 06/28/2019 0700   PO2ART 110 (H) 06/28/2019 0700   HCO3 32.9 (H) 06/28/2019 0700   TCO2 38 (H) 06/28/2019 0332   O2SAT 96.7 06/28/2019 0700     Coagulation Profile: Recent Labs  Lab 05/27/2019 1737  INR 1.2    Cardiac Enzymes: Recent Labs  Lab 06/26/19 1357  CKTOTAL 19*    HbA1C: Hgb A1c MFr Bld  Date/Time Value Ref Range Status  10/25/2018 10:10 AM 5.6 <5.7 % of total Hgb Final    Comment:    For the purpose of screening for the presence of diabetes: . <5.7%       Consistent with the absence of diabetes 5.7-6.4%     Consistent with increased risk for diabetes             (prediabetes) > or =6.5%  Consistent with diabetes . This assay result is consistent with a decreased risk of diabetes. . Currently, no consensus exists regarding use of hemoglobin A1c for diagnosis of diabetes in children. . According to American Diabetes Association (ADA) guidelines, hemoglobin A1c <7.0% represents optimal control in non-pregnant diabetic patients. Different metrics may apply to specific patient populations.  Standards of Medical Care in Diabetes(ADA). Marland Kitchen   10/08/2018 05:17 AM 5.7 (H) 4.8 - 5.6 % Final    Comment:    (NOTE) Pre diabetes:          5.7%-6.4% Diabetes:              >6.4% Glycemic control for   <7.0% adults with diabetes     CBG: Recent Labs  Lab 06/24/19 1922 06/28/19 0733 06/28/19 1143  GLUCAP 131* 152* 140*    Critical care time:   The patient is critically ill with multiple organ systems failure and requires high complexity decision making for assessment and support, frequent evaluation and titration of therapies, application of advanced monitoring technologies and extensive interpretation of multiple databases.   Critical Care Time devoted to patient care services described in this note is 38 minutes. This time reflects the time of my personal involvement. This critical care time does not reflect separately billable procedures or procedure time, teaching time or supervisory time of PA/NP/Med student/Med Resident etc but could involve care discussion time.  [facility] 06-30-2019 9:01 AM  Pager: [phone] After hours pager: [phone]

## 2019-07-22 NOTE — Progress Notes (Signed)
Spoke with patient prior to intubation on 2/5 and patient wanted to be intubated and to "keep fighting". It is my impression that at that time the patient wanted to remain a full code. I discussed with charge RN and Elink RN on best course of action to ensure that DNR and comfort care are the pt's current wishes. Will pass along information to day shift to confirm patient's desire before planned extubation 2/6 @ 1100. Will continue to monitor.

## 2019-07-22 NOTE — Discharge Summary (Signed)
DEATH SUMMARY   Patient Details  Name: Howard Fox MRN: [id number] DOB: 1961/03/28  Admission/Discharge Information   Admit Date:  06-Jul-2019  Date of Death: Date of Death: Jul 13, 2019  Time of Death: Time of Death: 1200  Length of Stay: 7  Referring Physician: Unk Pinto, MD   Reason(s) for Hospitalization  Respiratory Failure  Diagnoses  Preliminary cause of death:   Interstitial Lung Disease MRSA Bacteremia Bipolar Disorder Tobacco Use Disorder IV Drug Use  Secondary Diagnoses (including complications and co-morbidities):  Principal Problem:   Chronic pain syndrome Active Problems:   Bipolar disorder ([place])   Essential hypertension   Hyperlipidemia, mixed   Obesity (BMI 30.0-34.9)   Hepatitis C, chronic (HCC)   MRSA bacteremia   Acute respiratory failure with hypoxia (HCC)   Thrombocytosis ([place])   DNR (do not resuscitate) discussion   Palliative care by specialist   Goals of care, counseling/discussion   Comfort measures only status   Terminal care   Brief Hospital Course (including significant findings, care, treatment, and services provided and events leading to death)  Howard Fox is a 59 y.o. 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   He has had a recent prolonged hospital stay for MRSA bacteremia, and had progressive hypoxemic respiratory failure which required that he was discharged on home oxygen 4 to 5 L.  He had been found nonresponsive in the bathtub by his mother cyanotic and tachypneic.  Upon arrival to the emergency department he was put on a nonrebreather, and was quickly transferred to the intensive care unit for closer monitoring.  Over the next several days he failed to  respond to antibiotics broad-spectrum, as well as high-dose steroids.  His CT chest demonstrated acute interstitial pneumonia, and he was never well enough from a respiratory standpoint for bronchoscopy for further diagnostic evaluation.  He was unfortunately intubated after decompensation on the evening of 2/4.  To discussion with his mother, she felt that Annie Main has been struggling for some time with substance abuse, recurrent infections, bipolar disorder, and now this new lung disease she felt like that he was struggling too much.  The decision was made for the family to perform a compassionate extubation which was medically appropriate.  Palliative medicine was consulted. He was transition to comfort measures only on the morning of 2/6, and his time of death was 12 PM.  His family was at the bedside.   Pertinent Labs and Studies  Significant Diagnostic Studies DG Chest 1 View  Result Date: 06/12/2019 CLINICAL DATA:  Shortness of breath. Hypoxemia. EXAM: CHEST  1 VIEW COMPARISON:  Chest x-rays dated 06/10/2019 and 06/09/2019 and chest CT dated 06/06/2019 FINDINGS: The faint diffuse pulmonary infiltrates persist, unchanged as demonstrated on the prior chest CT. Heart size and pulmonary vascularity are normal. Prior resection of multiple left posteroinferior ribs with multiple surgical wires, some of which are chronically disrupted. Harrington rods in the thoracolumbar spine. IMPRESSION: Stable faint bilateral pulmonary infiltrates. Electronically Signed   By: Lorriane Shire M.D.   On: 06/12/2019 12:35   DG Chest 1 View  Result Date: 06/10/2019 CLINICAL DATA:  Worsening shortness of breath with fever and chills. EXAM: CHEST  1 VIEW COMPARISON:  June 08, 2018 FINDINGS: There is stable right-sided PICC line positioning. Multiple radiopaque  surgical sutures are seen overlying the mid left lung and left upper quadrant. There is mild persistent left-sided volume loss with stable diffusely increased  interstitial lung markings. Very mild, stable left basilar atelectasis and/or infiltrate is seen. The heart size and mediastinal contours are within normal limits. Bilateral pedicle screws are seen within the mid and lower thoracic spine. IMPRESSION: 1. No significant interval change compared to the prior chest plain film dated June 09, 2019. Electronically Signed   By: Virgina Norfolk M.D.   On: 06/10/2019 18:11   DG Chest 1 View  Result Date: 06/09/2019 CLINICAL DATA:  Hypoxemia EXAM: CHEST  1 VIEW COMPARISON:  Radiograph 06/06/2019, CT 06/06/2019 FINDINGS: Cardiac silhouette is enlarged. Thoracotomy defect with removal of LEFT posterior ribs. LEFT basilar atelectasis unchanged. Diffuse fine airspace disease throughout the lungs similar prior. PICC line with tip in distal SVC. IMPRESSION: 1. Diffuse airspace disease favoring pulmonary edema. 2. LEFT lower lobe atelectasis and effusion. 3. Postsurgical change in the LEFT hemithorax. Electronically Signed   By: Suzy Bouchard M.D.   On: 06/09/2019 09:42   DG Chest 1 View  Result Date: 06/06/2019 CLINICAL DATA:  Fever, shortness of breath, cough EXAM: CHEST  1 VIEW COMPARISON:  06/02/2019 FINDINGS: Interval worsening of diffuse bilateral interstitial airspace opacity. Unchanged cardiomegaly. Postoperative findings of posterior left chest wall resection. IMPRESSION: Interval worsening of diffuse bilateral interstitial airspace opacity, consistent with worsening edema and/or infection. Electronically Signed   By: Eddie Candle M.D.   On: 06/06/2019 09:56   CT CHEST WO CONTRAST  Result Date: 06/24/2019 CLINICAL DATA:  59 year old male with recent pneumonia. Patient presenting with worsening hypoxemia. EXAM: CT CHEST WITHOUT CONTRAST TECHNIQUE: Multidetector CT imaging of the chest was performed following the standard protocol without IV contrast. COMPARISON:  Chest CT dated 06/06/2019. FINDINGS: Evaluation of this exam is limited in the absence of  intravenous contrast. Cardiovascular: Borderline cardiomegaly. No pericardial effusion. Coronary vascular calcifications noted. The thoracic aorta is unremarkable. There is slight prominence of the main pulmonary trunk suggestive of a degree of pulmonary hypertension. Right-sided PICC with tip in central SVC close to the cavoatrial junction. Mediastinum/Nodes: There is no hilar adenopathy. Mediastinal lymph node in the prevascular space measures 12 mm short axis similar to prior CT. The esophagus is grossly unremarkable. No mediastinal fluid collection. Lungs/Pleura: Diffuse airspace ground-glass opacity and interstitial and interlobular septal thickening. Overall interval progression of the airspace disease compared to the prior CT with increased involvement of the lung bases consistent with worsening inflammatory/infectious process. Clinical correlation and follow-up recommended. There is no pleural effusion or pneumothorax. The central airways are patent. Upper Abdomen: Cholecystectomy. Musculoskeletal: Degenerative changes of the spine. Mid to lower thoracic posterior fusion hardware and postsurgical changes of the posterior left thoracic wall. No acute osseous pathology. Chronic T10 and T11 compression fractures similar to prior CT. IMPRESSION: 1. Overall interval worsening of airspace disease compared to the prior CT in keeping with worsening inflammatory/infectious etiology. Clinical correlation and follow-up recommended. 2. Additional findings similar to prior CT. Electronically Signed   By: Anner Crete M.D.   On: 06/24/2019 15:39   CT CHEST W CONTRAST  Result Date: 06/06/2019 CLINICAL DATA:  59 year old male with history of hypoxemia. EXAM: CT CHEST WITH CONTRAST TECHNIQUE: Multidetector CT imaging of the chest was performed during intravenous contrast administration. CONTRAST:  9mL OMNIPAQUE IOHEXOL 300 MG/ML  SOLN COMPARISON:  Chest CTA 10/08/2018. FINDINGS: Cardiovascular: Heart size is normal.  There is no significant pericardial fluid, thickening or pericardial  calcification. There is aortic atherosclerosis, as well as atherosclerosis of the great vessels of the mediastinum and the coronary arteries, including calcified atherosclerotic plaque in the left main, left anterior descending, left circumflex and right coronary arteries. Mediastinum/Nodes: Prominent but nonenlarged mediastinal and hilar lymph nodes, measuring up to 1.1 cm in short axis in the prevascular nodal station, nonspecific. Esophagus is unremarkable in appearance. No axillary lymphadenopathy. Lungs/Pleura: Status post left thoracotomy for left lower lobectomy. Compensatory hyperexpansion of the left upper lobe. Widespread areas of ground-glass attenuation and septal thickening are noted throughout the lungs bilaterally, most evident throughout the mid to upper lungs. Several areas demonstrate relative sparing of the subpleural lung. No pleural effusions. No definite suspicious appearing pulmonary nodules or masses. Upper Abdomen: Aortic atherosclerosis.  Status post cholecystectomy. Musculoskeletal: Postoperative changes of left-sided thoracotomy and chest wall resection. New posterior rod and screw fixation device starting at T8 and extending below the lower margin of the images, traversing chronic compression fractures of T10 and T11 which both demonstrate greater than 90% loss of anterior vertebral body height resultant in an acute kyphotic deformity at the level of T10/T11. There are no aggressive appearing lytic or blastic lesions noted in the visualized portions of the skeleton. IMPRESSION: 1. Widespread areas of ground-glass attenuation and septal thickening most evident throughout the mid to upper lungs, with some areas of subpleural sparing. Findings are nonspecific, favored to be of infectious or inflammatory etiology. 2. Aortic atherosclerosis, in addition to left main and 3 vessel coronary artery disease. Please note that  although the presence of coronary artery calcium documents the presence of coronary artery disease, the severity of this disease and any potential stenosis cannot be assessed on this non-gated CT examination. Assessment for potential risk factor modification, dietary therapy or pharmacologic therapy may be warranted, if clinically indicated. 3. Postoperative changes, as above. Aortic Atherosclerosis (ICD10-I70.0). Electronically Signed   By: Vinnie Langton M.D.   On: 06/06/2019 10:49   MR THORACIC SPINE WO CONTRAST  Result Date: 06/05/2019 CLINICAL DATA:  Mid back pain. Febrile. Prior fusion from T8-L2. History of vertebral osteomyelitis. EXAM: MRI THORACIC AND LUMBAR SPINE WITHOUT CONTRAST TECHNIQUE: Multiplanar and multiecho pulse sequences of the thoracic and lumbar spine were obtained without intravenous contrast. The patient refused to continue because he got hot. He said he would return at another time for contrast. COMPARISON:  Chest x-ray dated 06/02/2019 and thoracic radiographs dated 05/23/2019 and thoracic MRI dated 09/30/2018 FINDINGS: MRI THORACIC SPINE FINDINGS Alignment: Accentuated lower thoracic kyphosis secondary to previous bone destruction at T10-11. Vertebrae: Hardware obscures some detail from T8 distally. No visible discitis or osteomyelitis or other significant acute bone abnormality. Cord: The visualized portion of the thoracic spinal cord appears normal. Hardware obscures the spinal cord at T10 extending distally. Paraspinal and other soft tissues: There is an abnormal appearance of both lungs, left worse than right. Recent chest x-ray of 06/02/2019 did not demonstrate significant abnormalities. This appearance may be artifactual. Disc levels: There are tiny disc bulges at T2-3 and T3-4 without neural impingement. The discs from C7-T1 through T6-7 are otherwise normal. No spinal or foraminal stenoses. T7-8: Tiny disc bulge to the right of midline with no neural impingement. T8-9:  Negative. T9-10: Negative. T10-11: The details of the T10 and T11 vertebral bodies is obscured by hardware. The appearance suggests that those vertebral bodies have fused with Ace secondary anterior wedge deformity due to the previous osteomyelitis. T11-12: No disc bulging or protrusion. T12-L1: No visible abnormality. MRI LUMBAR  SPINE FINDINGS Segmentation:  Standard. Alignment:  2 mm retrolisthesis of L4 on L5. Vertebrae: Pedicle screws extend from T8-L2. No acute abnormality of the lumbar spine. Conus medullaris and cauda equina: Conus extends to the L1-2 level. Conus and cauda equina appear normal. Paraspinal and other soft tissues: Negative. Disc levels: T12-L1 and L1-2: Normal disc. Pedicle screws. L2-3: Small broad-based disc bulge without neural impingement. No facet arthritis. Pedicle screws in L2. L3-4: Small broad-based disc bulge asymmetric into the left neural foramen. Moderate bilateral facet arthritis with ligamentum flavum hypertrophy creating mild spinal stenosis with slight left foraminal stenosis. However, the left L3 nerve exits without impingement. Nearing of both lateral recesses. L4-5: Small disc bulge slightly asymmetric to the right. Moderate hypertrophy and arthritis of the right facet joint. No neural impingement. L5-S1: Marked disc space narrowing with degenerative changes of the vertebral endplates. Small broad-based disc protrusion with accompanying osteophytes extending into both neural foramina without severe foraminal stenosis. No focal neural impingement. Minimal degenerative changes of the right facet joint. IMPRESSION: MR THORACIC SPINE IMPRESSION No discrete osteomyelitis or discitis in the thoracic spine. Detail is limited at the level of the previous discitis and osteomyelitis at T10-11. 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 The thoracic spine otherwise demonstrates no significant abnormalities. MR LUMBAR SPINE IMPRESSION 1. Moderate bilateral facet  arthritis at L3-4 with mild spinal stenosis and left foraminal stenosis. 2. Moderate right facet arthritis at L4-5. Electronically Signed   By: Lorriane Shire M.D.   On: 06/05/2019 14:57   MR LUMBAR SPINE WO CONTRAST  Result Date: 06/05/2019 CLINICAL DATA:  Mid back pain. Febrile. Prior fusion from T8-L2. History of vertebral osteomyelitis. EXAM: MRI THORACIC AND LUMBAR SPINE WITHOUT CONTRAST TECHNIQUE: Multiplanar and multiecho pulse sequences of the thoracic and lumbar spine were obtained without intravenous contrast. The patient refused to continue because he got hot. He said he would return at another time for contrast. COMPARISON:  Chest x-ray dated 06/02/2019 and thoracic radiographs dated 05/23/2019 and thoracic MRI dated 09/30/2018 FINDINGS: MRI THORACIC SPINE FINDINGS Alignment: Accentuated lower thoracic kyphosis secondary to previous bone destruction at T10-11. Vertebrae: Hardware obscures some detail from T8 distally. No visible discitis or osteomyelitis or other significant acute bone abnormality. Cord: The visualized portion of the thoracic spinal cord appears normal. Hardware obscures the spinal cord at T10 extending distally. Paraspinal and other soft tissues: There is an abnormal appearance of both lungs, left worse than right. Recent chest x-ray of 06/02/2019 did not demonstrate significant abnormalities. This appearance may be artifactual. Disc levels: There are tiny disc bulges at T2-3 and T3-4 without neural impingement. The discs from C7-T1 through T6-7 are otherwise normal. No spinal or foraminal stenoses. T7-8: Tiny disc bulge to the right of midline with no neural impingement. T8-9: Negative. T9-10: Negative. T10-11: The details of the T10 and T11 vertebral bodies is obscured by hardware. The appearance suggests that those vertebral bodies have fused with Ace secondary anterior wedge deformity due to the previous osteomyelitis. T11-12: No disc bulging or protrusion. T12-L1: No visible  abnormality. MRI LUMBAR SPINE FINDINGS Segmentation:  Standard. Alignment:  2 mm retrolisthesis of L4 on L5. Vertebrae: Pedicle screws extend from T8-L2. No acute abnormality of the lumbar spine. Conus medullaris and cauda equina: Conus extends to the L1-2 level. Conus and cauda equina appear normal. Paraspinal and other soft tissues: Negative. Disc levels: T12-L1 and L1-2: Normal disc. Pedicle screws. L2-3: Small broad-based disc bulge without neural impingement. No facet arthritis.  Pedicle screws in L2. L3-4: Small broad-based disc bulge asymmetric into the left neural foramen. Moderate bilateral facet arthritis with ligamentum flavum hypertrophy creating mild spinal stenosis with slight left foraminal stenosis. However, the left L3 nerve exits without impingement. Nearing of both lateral recesses. L4-5: Small disc bulge slightly asymmetric to the right. Moderate hypertrophy and arthritis of the right facet joint. No neural impingement. L5-S1: Marked disc space narrowing with degenerative changes of the vertebral endplates. Small broad-based disc protrusion with accompanying osteophytes extending into both neural foramina without severe foraminal stenosis. No focal neural impingement. Minimal degenerative changes of the right facet joint. IMPRESSION: MR THORACIC SPINE IMPRESSION No discrete osteomyelitis or discitis in the thoracic spine. Detail is limited at the level of the previous discitis and osteomyelitis at T10-11. 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 The thoracic spine otherwise demonstrates no significant abnormalities. MR LUMBAR SPINE IMPRESSION 1. 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 2. Moderate right facet arthritis at L4-5. Electronically Signed   By: Lorriane Shire M.D.   On: 06/05/2019 14:57   DG CHEST PORT 1 VIEW  Result Date: 06/28/2019 CLINICAL DATA:  Status post intubation EXAM: PORTABLE CHEST 1 VIEW  COMPARISON:  06/28/2019 FINDINGS: Cardiac shadow is stable. Postsurgical changes are again seen. Gastric catheter is noted within the stomach. Endotracheal tube is noted in satisfactory position. Right-sided PICC line is noted at the cavoatrial junction. Stable interstitial edema is noted. Postsurgical changes in the left chest wall are seen. IMPRESSION: Stable interstitial edema. Tubes and lines as described above. Electronically Signed   By: Inez Catalina M.D.   On: 06/28/2019 02:14   DG CHEST PORT 1 VIEW  Result Date: 06/28/2019 CLINICAL DATA:  Respiratory distress EXAM: PORTABLE CHEST 1 VIEW COMPARISON:  06/23/2019 FINDINGS: Unchanged position of right upper extremity PICC line. Extensive left hemithoracic postsurgical change. Incompletely visualized bilateral spinal rods. Mild interstitial pulmonary edema. No pneumothorax or sizable pleural effusion. IMPRESSION: Unchanged mild interstitial pulmonary edema Electronically Signed   By: Ulyses Jarred M.D.   On: 06/28/2019 01:14   DG CHEST PORT 1 VIEW  Result Date: 06/23/2019 CLINICAL DATA:  Respiratory distress EXAM: PORTABLE CHEST 1 VIEW COMPARISON:  Chest radiograph 05/29/2019 FINDINGS: Right upper extremity PICC line tip projects over the superior vena cava. Monitoring leads overlie the patient. Stable cardiac and mediastinal contours. Similar-appearing extensive postsurgical changes involving the left hemithorax. Similar diffuse bilateral interstitial pulmonary opacities. Thoracic spinal fusion hardware. IMPRESSION: Similar bilateral interstitial pulmonary opacities. Similar extensive postsurgical changes left hemithorax. Electronically Signed   By: Lovey Newcomer M.D.   On: 06/23/2019 08:26   DG Chest Port 1 View  Result Date: 06/18/2019 CLINICAL DATA:  Cough EXAM: PORTABLE CHEST 1 VIEW COMPARISON:  06/12/2019, 06/06/2019 FINDINGS: Postsurgical changes related to left lower lobectomy and resection of multiple left-sided ribs with extensive surgical  wires, many of which are chronically disrupted. Partially visualized thoracolumbar fusion hardware. Grossly stable cardiomediastinal contours. Diffuse interstitial opacities throughout both lungs, slightly progressed from prior. No pneumothorax is seen. IMPRESSION: Diffuse interstitial opacities throughout both lungs, slightly progressed from prior. Findings may reflect pulmonary edema versus atypical or viral infection. Electronically Signed   By: Davina Poke D.O.   On: 06/07/2019 17:55   DG Chest Port 1 View  Result Date: 06/02/2019 CLINICAL DATA:  Fever, shortness of breath EXAM: PORTABLE CHEST 1 VIEW COMPARISON:  11/25/2018 FINDINGS: Heart is borderline in size. Mild vascular congestion. Bilateral perihilar  opacities. Left basilar opacities. Findings could reflect edema or infection. No visible significant effusions or acute bony abnormality. IMPRESSION: Borderline heart size. Vascular congestion with bilateral perihilar and left lower lobe opacities, edema versus infection. Electronically Signed   By: Rolm Baptise M.D.   On: 06/02/2019 22:58   ECHOCARDIOGRAM COMPLETE  Result Date: 06/23/2019   ECHOCARDIOGRAM REPORT   Patient Name:   MEHKAI ISAGUIRRE Deak Date of Exam: 06/23/2019 Medical Rec #:  [id number]      Height:       72.0 in Accession #:    [id number]     Weight:       227.0 lb Date of Birth:  [birth year]      BSA:          2.25 m Patient Age:    26 years       BP:           124/87 mmHg Patient Gender: M              HR:           102 bpm. Exam Location:  Inpatient Procedure: 2D Echo Indications:    Bacteremia 790.7 / R78.81  History:        Patient has prior history of Echocardiogram examinations, most                 recent 06/04/2019. Risk Factors:Hypertension, Dyslipidemia and                 Current Smoker. Alcohol abuse. Hepatitis C.  Sonographer:    Darlina Sicilian RDCS Referring Phys: [id number] [place]  1. Left ventricular ejection fraction, by visual estimation, is 55 to 60%.  The left ventricle has normal function. There is mildly increased left ventricular hypertrophy.  2. The left ventricle has no regional wall motion abnormalities.  3. Global right ventricle has moderately reduced systolic function.The right ventricular size is mildly enlarged. Right vetricular wall thickness was not assessed.  4. Poor acoustic windows No true apical view. RV dysfunction as noted. Review of echo images from Jan 2021, RVEF appeared down in that as well.  5. The mitral valve is normal in structure. No evidence of mitral valve regurgitation.  6. The tricuspid valve is normal in structure.  7. The tricuspid valve is normal in structure. Tricuspid valve regurgitation is mild.  8. The aortic valve is tricuspid. Aortic valve regurgitation is not visualized.  9. The pulmonic valve was grossly normal. Pulmonic valve regurgitation is not visualized. 10. Aortic dilatation noted. 11. 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 12. Moderately elevated pulmonary artery systolic pressure. 13. The inferior vena cava is normal in size with greater than 50% respiratory variability, suggesting right atrial pressure of 3 mmHg. FINDINGS  Left Ventricle: Left ventricular ejection fraction, by visual estimation, is 55 to 60%. The left ventricle has normal function. The left ventricle has no regional wall motion abnormalities. The left ventricular internal cavity size was the left ventricle is normal in size. There is mildly increased left ventricular hypertrophy. Right Ventricle: The right ventricular size is mildly enlarged. Right vetricular wall thickness was not assessed. Global RV systolic function is has moderately reduced systolic function. The tricuspid regurgitant velocity is 3.01 m/s, and with an assumed  right atrial pressure of 8 mmHg, the estimated right ventricular systolic pressure is moderately elevated at 44.2 mmHg. Poor acoustic windows No true apical view. RV dysfunction as noted. Review  of echo images  from Jan 2021, RVEF appeared down in that as well. Left Atrium: Left atrial size was normal in size. Right Atrium: Right atrial size was normal in size Pericardium: There is no evidence of pericardial effusion. Mitral Valve: The mitral valve is normal in structure. No evidence of mitral valve regurgitation. Tricuspid Valve: The tricuspid valve is normal in structure. Tricuspid valve regurgitation is mild. Aortic Valve: The aortic valve is tricuspid. Aortic valve regurgitation is not visualized. Pulmonic Valve: The pulmonic valve was grossly normal. Pulmonic valve regurgitation is not visualized. Pulmonic regurgitation is not visualized. Aorta: Aortic dilatation noted. 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 Venous: The inferior vena cava is normal in size with greater than 50% respiratory variability, suggesting right atrial pressure of 3 mmHg. IAS/Shunts: No atrial level shunt detected by color flow Doppler.  LEFT VENTRICLE PLAX 2D LVIDd:         5.00 cm  Diastology LVIDs:         3.80 cm  LV e' lateral:   4.46 cm/s LV PW:         0.70 cm  LV E/e' lateral: 16.7 LV IVS:        1.20 cm  LV e' medial:    3.26 cm/s LVOT diam:     2.50 cm  LV E/e' medial:  22.9 LV SV:         56 ml LV SV Index:   24.34 LVOT Area:     4.91 cm  LEFT ATRIUM         Index LA diam:    3.00 cm 1.33 cm/m  AORTIC VALVE LVOT Vmax:   68.00 cm/s LVOT Vmean:  44.800 cm/s LVOT VTI:    0.095 m  AORTA Ao Root diam: 4.30 cm Ao Asc diam:  4.00 cm MITRAL VALVE                        TRICUSPID VALVE MV Area (PHT): 8.82 cm             TR Peak grad:   36.2 mmHg MV PHT:        24.94 msec           TR Vmax:        313.00 cm/s MV Decel Time: 86 msec MV E velocity: 74.70 cm/s 103 cm/s  SHUNTS MV A velocity: 55.30 cm/s 70.3 cm/s Systemic VTI:  0.10 m MV E/A ratio:  1.35       1.5       Systemic Diam: 2.50 cm  Dorris Carnes MD Electronically signed by Dorris Carnes MD Signature Date/Time: 06/23/2019/4:14:31 PM    Final     ECHOCARDIOGRAM COMPLETE  Result Date: 06/04/2019   ECHOCARDIOGRAM REPORT   Patient Name:   UZIAH ORENDER Grissett Date of Exam: 06/04/2019 Medical Rec #:  [id number]      Height:       72.0 in Accession #:    [id number]     Weight:       242.0 lb Date of Birth:  1960/12/29      BSA:          2.31 m Patient Age:    65 years       BP:           132/75 mmHg Patient Gender: M              HR:  95 bpm. Exam Location:  Inpatient Procedure: 2D Echo, Cardiac Doppler and Color Doppler Indications:    Bacteremia  History:        Patient has prior history of Echocardiogram examinations, most                 recent 10/10/2018. Signs/Symptoms:Bacteremia; Risk                 Factors:Hypertension, Dyslipidemia and Diabetes. Hep. C, IVDU,                 MRSA, T-spine discitis.  Sonographer:    Dustin Flock Referring Phys: [place]  1. Left ventricular ejection fraction, by visual estimation, is 55 to 60%. The left ventricle has normal function. There is no left ventricular hypertrophy.  2. Left ventricular diastolic parameters are indeterminate.  3. The left ventricle has no regional wall motion abnormalities.  4. Global right ventricle has normal systolic function.The right ventricular size is normal. No increase in right ventricular wall thickness.  5. Left atrial size was normal.  6. Right atrial size was normal.  7. The mitral valve is normal in structure. No evidence of mitral valve regurgitation. No evidence of mitral stenosis.  8. The tricuspid valve is normal in structure.  9. The aortic valve is normal in structure. Aortic valve regurgitation is not visualized. No evidence of aortic valve sclerosis or stenosis. 10. The pulmonic valve was normal in structure. Pulmonic valve regurgitation is not visualized. 11. The inferior vena cava is normal in size with greater than 50% respiratory variability, suggesting right atrial pressure of 3 mmHg. FINDINGS  Left Ventricle: Left ventricular  ejection fraction, by visual estimation, is 55 to 60%. The left ventricle has normal function. The left ventricle has no regional wall motion abnormalities. There is no left ventricular hypertrophy. Left ventricular diastolic parameters are indeterminate. Indeterminate filling pressures. Right Ventricle: The right ventricular size is normal. No increase in right ventricular wall thickness. Global RV systolic function is has normal systolic function. Left Atrium: Left atrial size was normal in size. Right Atrium: Right atrial size was normal in size Pericardium: There is no evidence of pericardial effusion. Mitral Valve: The mitral valve is normal in structure. No evidence of mitral valve regurgitation. No evidence of mitral valve stenosis by observation. Tricuspid Valve: The tricuspid valve is normal in structure. Tricuspid valve regurgitation is not demonstrated. Aortic Valve: The aortic valve is normal in structure. Aortic valve regurgitation is not visualized. The aortic valve is structurally normal, with no evidence of sclerosis or stenosis. Pulmonic Valve: The pulmonic valve was normal in structure. Pulmonic valve regurgitation is not visualized. Pulmonic regurgitation is not visualized. Aorta: The aortic root, ascending aorta and aortic arch are all structurally normal, with no evidence of dilitation or obstruction. Venous: The inferior vena cava is normal in size with greater than 50% respiratory variability, suggesting right atrial pressure of 3 mmHg. IAS/Shunts: No atrial level shunt detected by color flow Doppler. There is no evidence of a patent foramen ovale. No ventricular septal defect is seen or detected. There is no evidence of an atrial septal defect. Additional Comments: No vegetations are seen. Consider TEE if the suspicion for endocarditis is high.  LEFT VENTRICLE PLAX 2D LVIDd:         5.21 cm  Diastology LVIDs:         3.38 cm  LV e' lateral:   7.94 cm/s LV PW:  1.09 cm  LV E/e' lateral:  8.6 LV IVS:        1.13 cm  LV e' medial:    5.22 cm/s LVOT diam:     2.50 cm  LV E/e' medial:  13.1 LV SV:         83 ml LV SV Index:   34.81 LVOT Area:     4.91 cm  RIGHT VENTRICLE RV Basal diam:  2.69 cm RV S prime:     13.20 cm/s TAPSE (M-mode): 2.9 cm LEFT ATRIUM             Index       RIGHT ATRIUM           Index LA diam:        3.50 cm 1.51 cm/m  RA Area:     14.20 cm LA Vol (A2C):   48.1 ml 20.82 ml/m RA Volume:   40.60 ml  17.57 ml/m LA Vol (A4C):   51.2 ml 22.16 ml/m LA Biplane Vol: 51.8 ml 22.42 ml/m  AORTIC VALVE LVOT Vmax:   99.80 cm/s LVOT Vmean:  65.700 cm/s LVOT VTI:    0.206 m  AORTA Ao Root diam: 3.70 cm MITRAL VALVE MV Area (PHT): 5.66 cm             SHUNTS MV PHT:        38.86 msec           Systemic VTI:  0.21 m MV Decel Time: 134 msec             Systemic Diam: 2.50 cm MV E velocity: 68.40 cm/s 103 cm/s MV A velocity: 65.50 cm/s 70.3 cm/s MV E/A ratio:  1.04       1.5  Mihai Croitoru MD Electronically signed by Sanda Klein MD Signature Date/Time: 06/04/2019/6:09:26 PM    Final    [place] EKG SITE RITE  Result Date: 06/06/2019 If Site Rite image not attached, placement could not be confirmed due to current cardiac rhythm.   Microbiology Recent Results (from the past 240 hour(s))  Respiratory Panel by RT PCR (Flu A&B, Covid) - Nasopharyngeal Swab     Status: None   Collection Time: 05/26/2019  5:37 PM   Specimen: Nasopharyngeal Swab  Result Value Ref Range Status   SARS Coronavirus 2 by RT PCR NEGATIVE NEGATIVE Final    Comment: (NOTE) SARS-CoV-2 target nucleic acids are NOT DETECTED. The SARS-CoV-2 RNA is generally detectable in upper respiratoy specimens during the acute phase of infection. The lowest concentration of SARS-CoV-2 viral copies this assay can detect is 131 copies/mL. A negative result does not preclude SARS-Cov-2 infection and should not be used as the sole basis for treatment or other patient management decisions. A negative result may occur with   improper specimen collection/handling, submission of specimen other than nasopharyngeal swab, presence of viral mutation(s) within the areas targeted by this assay, and inadequate number of viral copies (<131 copies/mL). A negative result must be combined with clinical observations, patient history, and epidemiological information. The expected result is Negative. Fact Sheet for Patients:  PinkCheek.be Fact Sheet for Healthcare Providers:  GravelBags.it This test is not yet ap proved or cleared by the [place] FDA and  has been authorized for detection and/or diagnosis of SARS-CoV-2 by FDA under an Emergency Use Authorization (EUA). This EUA will remain  in effect (meaning this test can be used) for the duration of the COVID-19 declaration under Section 564(b)(1) of the Act, 21 U.S.C.  section 360bbb-3(b)(1), unless the authorization is terminated or revoked sooner.    Influenza A by PCR NEGATIVE NEGATIVE Final   Influenza B by PCR NEGATIVE NEGATIVE Final    Comment: (NOTE) The Xpert Xpress SARS-CoV-2/FLU/RSV assay is intended as an aid in  the diagnosis of influenza from Nasopharyngeal swab specimens and  should not be used as a sole basis for treatment. Nasal washings and  aspirates are unacceptable for Xpert Xpress SARS-CoV-2/FLU/RSV  testing. Fact Sheet for Patients: PinkCheek.be Fact Sheet for Healthcare Providers: GravelBags.it This test is not yet approved or cleared by the [place] FDA and  has been authorized for detection and/or diagnosis of SARS-CoV-2 by  FDA under an Emergency Use Authorization (EUA). This EUA will remain  in effect (meaning this test can be used) for the duration of the  Covid-19 declaration under Section 564(b)(1) of the Act, 21  U.S.C. section 360bbb-3(b)(1), unless the authorization is  terminated or revoked. Performed at  [facility], [place] [address], [place], [place] [address]   Blood Culture (routine x 2)     Status: None   Collection Time: 06/14/2019  5:40 PM   Specimen: BLOOD LEFT WRIST  Result Value Ref Range Status   Specimen Description BLOOD LEFT WRIST  Final   Special Requests   Final    BOTTLES DRAWN AEROBIC AND ANAEROBIC Blood Culture adequate volume   Culture   Final    NO GROWTH 5 DAYS Performed at [facility], [place] [address], [place], [place] [address]    Report Status 06/27/2019 FINAL  Final  Blood Culture (routine x 2)     Status: None   Collection Time: 06/16/2019  7:52 PM   Specimen: BLOOD  Result Value Ref Range Status   Specimen Description BLOOD LEFT HAND  Final   Special Requests   Final    BOTTLES DRAWN AEROBIC AND ANAEROBIC Blood Culture adequate volume   Culture   Final    NO GROWTH 5 DAYS Performed at [facility], [place] [address], [place], [place] [address]    Report Status 06/27/2019 FINAL  Final  SARS CORONAVIRUS 2 (TAT 6-24 HRS) Nasopharyngeal Nasopharyngeal Swab     Status: None   Collection Time: 06/23/19  9:14 AM   Specimen: Nasopharyngeal Swab  Result Value Ref Range Status   SARS Coronavirus 2 NEGATIVE NEGATIVE Final    Comment: (NOTE) SARS-CoV-2 target nucleic acids are NOT DETECTED. The SARS-CoV-2 RNA is generally detectable in upper and lower respiratory specimens during the acute phase of infection. Negative results do not preclude SARS-CoV-2 infection, do not rule out co-infections with other pathogens, and should not be used as the sole basis for treatment or other patient management decisions. Negative results must be combined with clinical observations, patient history, and epidemiological information. The expected result is Negative. Fact Sheet for Patients: SugarRoll.be Fact Sheet for Healthcare Providers: https://www.woods-mathews.com/ This test is not yet approved or cleared by the [place] FDA and  has been authorized for detection and/or diagnosis of SARS-CoV-2 by FDA under an Emergency Use Authorization (EUA). This EUA will remain  in effect (meaning this test can be used) for the duration of the COVID-19 declaration under Section 56 4(b)(1) of the Act, 21 U.S.C. section 360bbb-3(b)(1), unless the authorization is terminated or revoked sooner. Performed at [facility], [place] [address] [place], [place] [address]   MRSA PCR Screening     Status: None   Collection Time: 06/23/19  4:01 PM   Specimen: Nasopharyngeal  Result Value Ref Range Status   MRSA by PCR NEGATIVE NEGATIVE Final    Comment:        The GeneXpert MRSA Assay (FDA approved for NASAL specimens only), is one component of a comprehensive MRSA colonization surveillance program. It is not intended to diagnose MRSA infection nor to guide or monitor treatment for MRSA infections. Performed at [facility], [place] [address], [place], [place] [address]     Lab Basic Metabolic Panel: Recent Labs  Lab 06/21/2019 1737 06/23/19 0952 06/24/19 0400 06/26/19 0956 06/28/19 0332  NA 138 141 138 137 136  K 3.9 4.1 4.0 3.9 4.8  CL 101 103 99 96*  --   CO2 25 25 28 28   --   GLUCOSE 147* 114* 137* 145*  --   BUN 18 23* 25* 24*  --   CREATININE 0.98 0.78 0.71 0.74  --   CALCIUM 8.9 9.1 9.2 9.4  --   MG  --  1.9  --   --   --    Liver Function Tests: Recent Labs  Lab 06/01/2019 1737 06/23/19 0952  AST 38 36  ALT 61* 56*  ALKPHOS 126 122  BILITOT 0.3 0.7  PROT 6.8 6.9  ALBUMIN 2.3* 2.3*   No results for input(s): LIPASE, AMYLASE in the last 168 hours. No results for input(s): AMMONIA in the last 168 hours. CBC: Recent Labs  Lab 06/21/2019 1730 05/29/2019 1737 06/23/19 0952 06/24/19 0400 06/28/19 0332  WBC  --  29.4* 19.8* 20.4*  --   NEUTROABS  --  28.2* 18.2* 18.7*  --   HGB 13.9 13.7 13.1 12.3* 17.0  HCT 41.0 42.3 40.4 38.4* 50.0  MCV  --  88.3 87.4 88.1  --   PLT  --  404*  374 323  --    Cardiac Enzymes: Recent Labs  Lab 06/26/19 1357  CKTOTAL 19*   Sepsis Labs: Recent Labs  Lab 06/11/2019 1737 06/19/2019 1951 06/23/19 0952 06/24/19 0400 06/25/19 0441  PROCALCITON 0.33  --  1.58 0.95 0.58  WBC 29.4*  --  19.8* 20.4*  --   LATICACIDVEN 2.9* 1.2  --   --   --     Procedures/Operations  Lenice Llamas, MD Pulmonary and [place] Pager: [phone] Office:[phone]

## 2019-07-22 NOTE — Progress Notes (Signed)
Waisted 80 mL of Morphine.  Harrel Lemon, RN witnessed.

## 2019-07-22 NOTE — Progress Notes (Signed)
Patient compassionately extubated per MD order/family wishes at 1135.  Ashley Mariner RRT

## 2019-07-22 NOTE — Progress Notes (Signed)
Patient passed at 1200.  Pronounced by Burna Mortimer, RN and Daron Offer, RN.  Family at bedside.  Patient transferred to the morgue.

## 2019-07-22 NOTE — Progress Notes (Signed)
   07-01-2019 1300  Clinical Encounter Type  Visited With Health care provider  Visit Type Patient actively dying  Referral From Nurse  Spiritual Encounters  Spiritual Needs Grief support   Chaplain received consult for end of life from the computer after end of life had taken place. Chaplain walked to 9M to offer grief support to the family but at that time family had already left the hospital. Chaplain remains available for support as needs arise.   Chaplain Resident, Evelene Croon, M Div. [phone] on-call pager

## 2019-07-22 DEATH — deceased

## 2019-10-30 ENCOUNTER — Encounter: Payer: Medicare Other | Admitting: Internal Medicine

## 2019-11-20 ENCOUNTER — Encounter: Payer: Medicare Other | Admitting: Internal Medicine

## 2020-04-08 ENCOUNTER — Ambulatory Visit: Payer: Medicare Other | Admitting: Adult Health

## 2020-08-04 IMAGING — CT CT ABDOMEN AND PELVIS WITHOUT CONTRAST
2 of 4 series · 14 of 46 positions shown, 16 images · non-contrast
Comparison: CT the abdomen and pelvis 09/11/2018.

CLINICAL DATA: 58-year-old male with history of constipation.
History of discitis and osteomyelitis at T10-T12 with right
paraspinal phlegmon.

EXAM:
CT ABDOMEN AND PELVIS WITHOUT CONTRAST
TECHNIQUE: Multidetector CT imaging of the abdomen and pelvis was performed
following the standard protocol without IV contrast.

[Series 2: axial st · axial · 0.95mm/px · z∈[+974,+1414]mm · 11 of 100 slices shown, 13 images]
[im 6/100  soft-tissue]
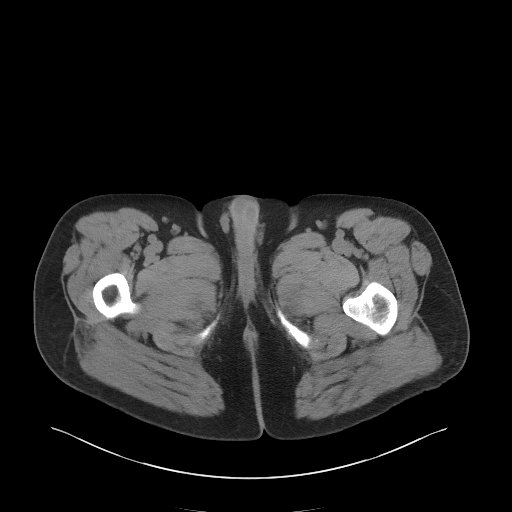
[im 6/100  bone]
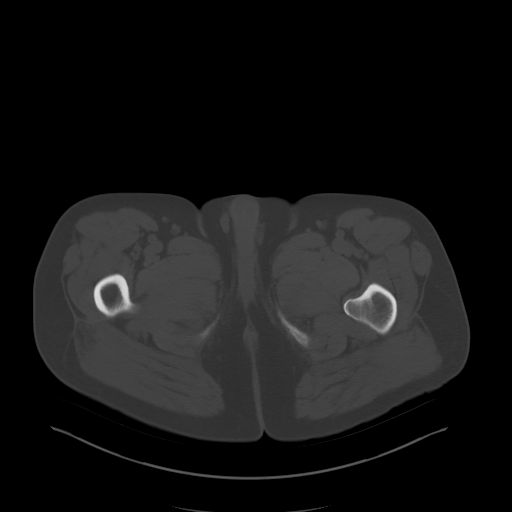
[im 16/100  soft-tissue]
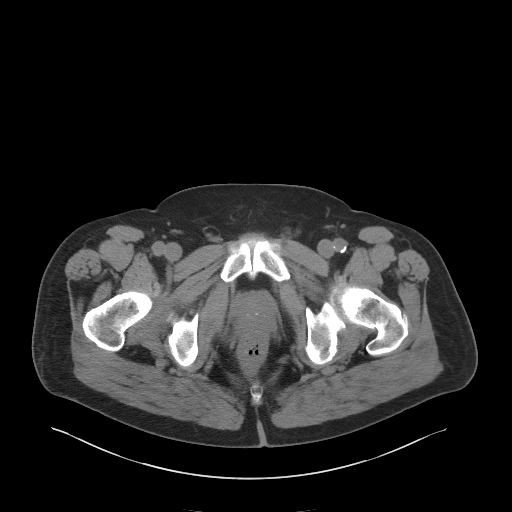
[im 27/100  soft-tissue]
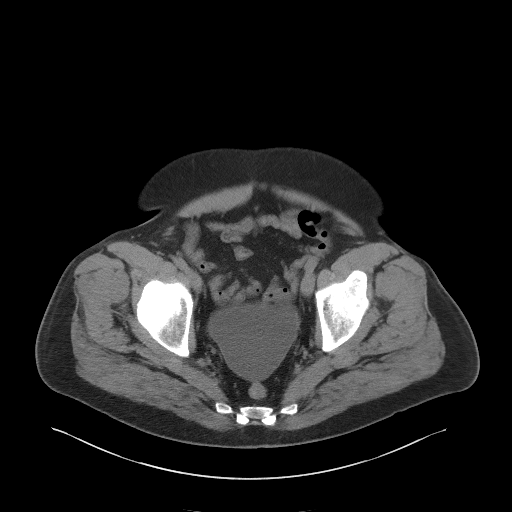
[im 32/100  soft-tissue]
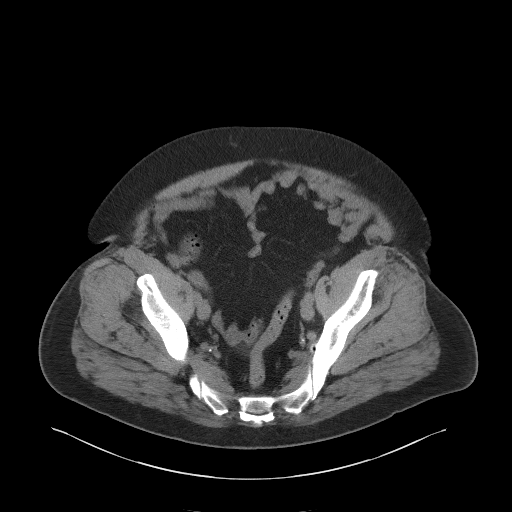
[im 42/100  soft-tissue]
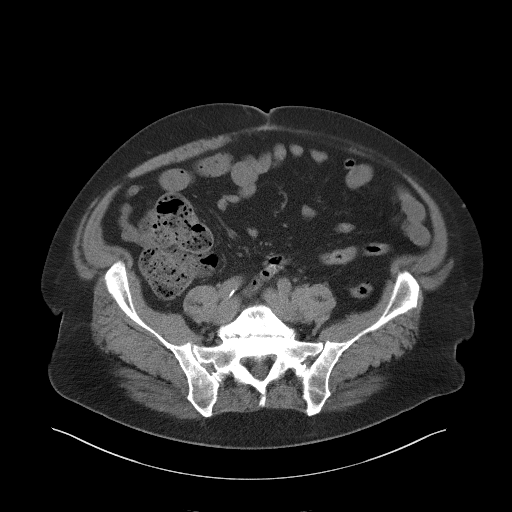
[im 53/100  soft-tissue]
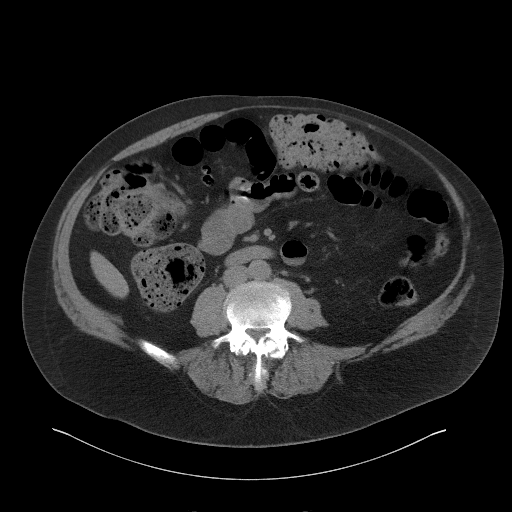
[im 58/100  soft-tissue]
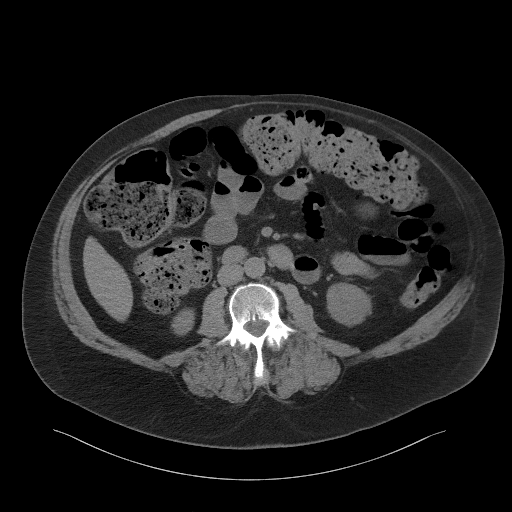
[im 68/100  soft-tissue]
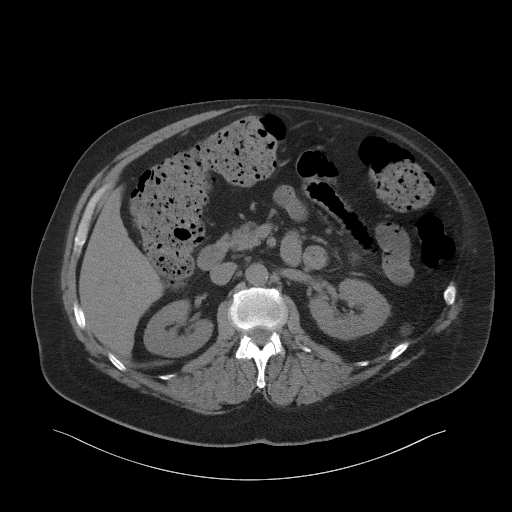
[im 73/100  soft-tissue]
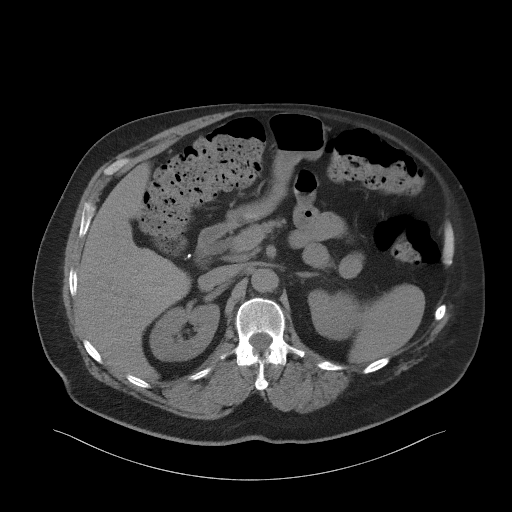
[im 73/100  bone]
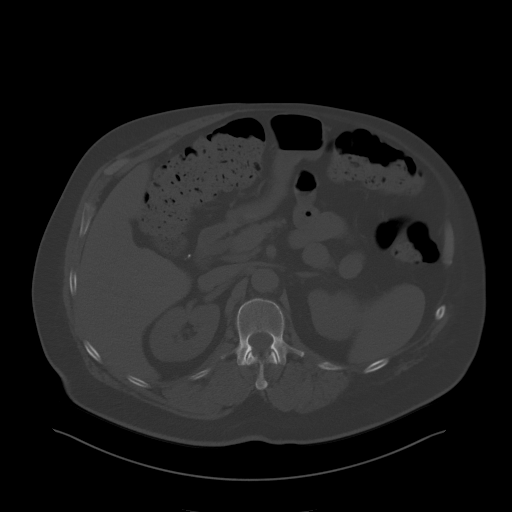
[im 84/100  soft-tissue]
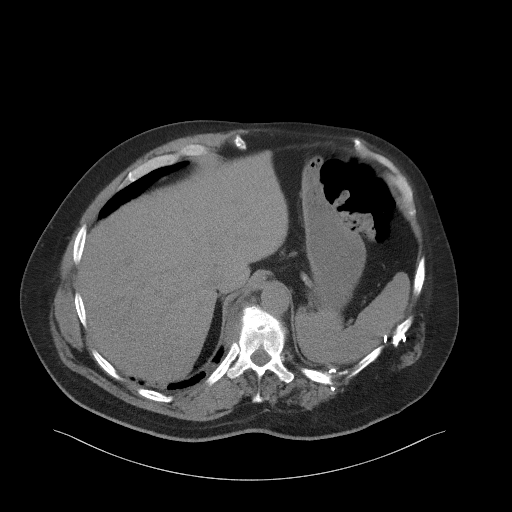
[im 94/100  soft-tissue]
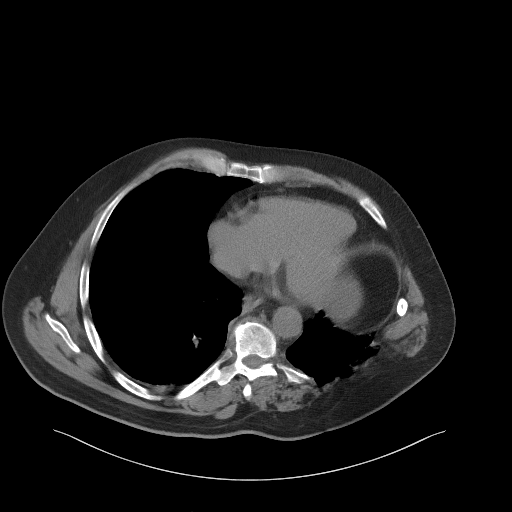

[Series 4: coronal st · coronal · 1.02mm/px · 3 of 108 slices shown]
[im 36/108  soft-tissue]
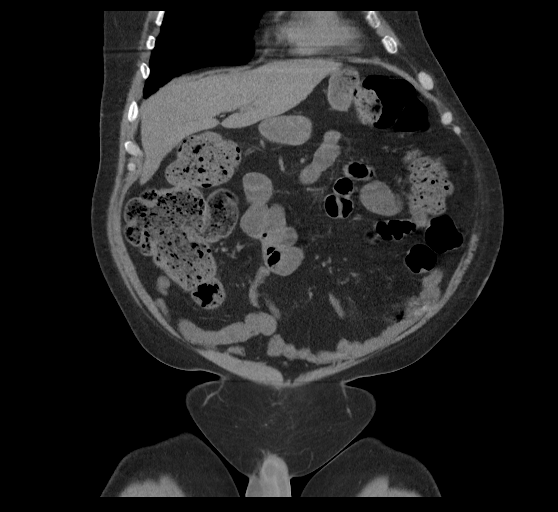
[im 48/108  soft-tissue]
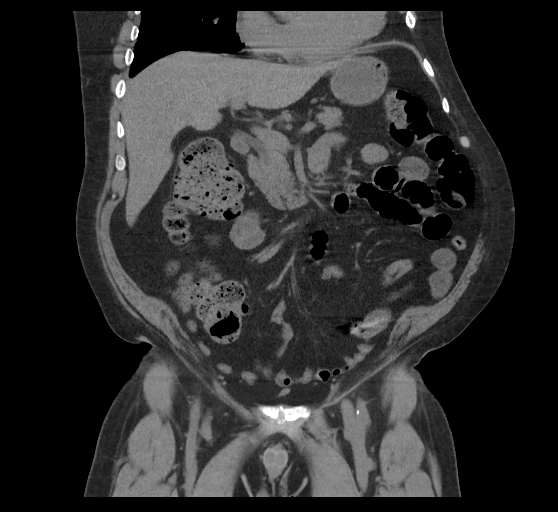
[im 60/108  soft-tissue]
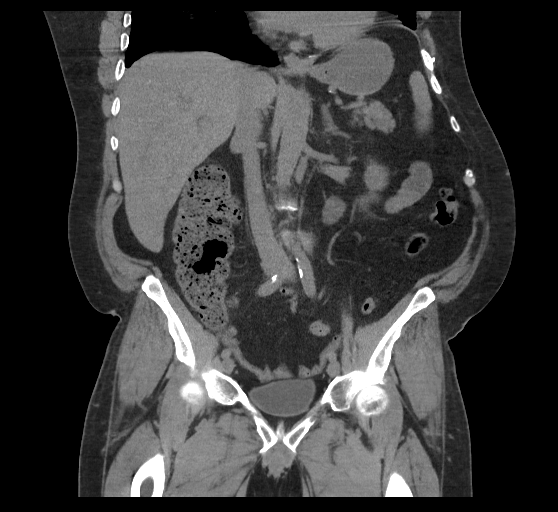

[14 of 46 positions shown; findings below may reference images not displayed]

FINDINGS: Lower chest: Postoperative changes of partial chest wall resection
in the lower left hemithorax incompletely imaged with some adjacent
postoperative scarring. Mild scarring and/or subsegmental
atelectasis in the right lower lobe dependently. Atherosclerotic
calcifications in the right coronary artery and descending thoracic
aorta.

Hepatobiliary: No definite suspicious cystic or solid hepatic
lesions are confidently identified on today's noncontrast CT
examination. Status post cholecystectomy.

Pancreas: No definite pancreatic mass or peripancreatic fluid or
inflammatory changes are noted on today's noncontrast CT
examination.

Spleen: 2.0 x 1.4 cm low-attenuation lesion in the lower aspect of
the spleen, similar to recent prior study 09/11/2018, but new
compared to more remote prior examinations, nonspecific, but
concerning for potential splenic abscess.

Adrenals/Urinary Tract: 3 mm nonobstructive calculus in the lower
pole collecting system of left kidney no additional calculi are
noted within the collecting system of either kidney, along the
course of either ureter, or within the lumen of the urinary bladder.
No hydroureteronephrosis. Unenhanced appearance of the kidneys and
bilateral adrenal glands is otherwise normal. Urinary bladder is
normal in appearance.

Stomach/Bowel: Normal appearance of the stomach. No pathologic
dilatation of small bowel or colon. A few scattered colonic
diverticulae are noted, without surrounding inflammatory changes to
suggest an acute diverticulitis at this time. Relatively large
volume of stool throughout the ascending colon and transverse colon.
Paucity of stool throughout the remaining portions of colon and
rectum which are otherwise rim relatively decompressed. No
obstructing lesion identified. Normal appendix.

Vascular/Lymphatic: Aortic atherosclerosis. No lymphadenopathy noted
in the abdomen or pelvis.

Reproductive: Prostate gland and seminal vesicles are unremarkable
in appearance.

Other: No significant volume of ascites.  No pneumoperitoneum.

Musculoskeletal: Disc height loss at T10-T11 with destructive
changes in the adjacent vertebral body endplates, compatible with
known discitis and osteomyelitis. Extensive right and anterior
paravertebral soft tissue swelling best appreciated on axial image
12 of series 2.
IMPRESSION: 1. Large amount of stool in the proximal to mid colon with
decompression of the distal colon and rectum. No obstructing lesion
identified.
2. No acute findings are noted in the abdomen or pelvis.
3. Changes of discitis and osteomyelitis at the T10-T11 interspace
with extensive right-sided paravertebral soft tissue swelling which
has increased compared to the prior study 09/11/2018.
4. Indeterminate low-attenuation lesion in the spleen, stable
compared to the most recent prior CT, but new compared to more
remote prior examinations. This is nonspecific, but in the setting
of known discitis and osteomyelitis, the possibility of a splenic
abscess could be considered.
5. 3 mm nonobstructive calculus in the lower pole collecting system
of left kidney. No ureteral stones or findings of urinary tract
obstruction are noted at this time.
6. Colonic diverticulosis without evidence of acute diverticulitis
at this time.
7. Aortic atherosclerosis, in addition to at least right coronary
artery disease. Please note that although the presence of coronary
artery calcium documents the presence of coronary artery disease,
the severity of this disease and any potential stenosis cannot be
assessed on this non-gated CT examination. Assessment for potential
risk factor modification, dietary therapy or pharmacologic therapy
may be warranted, if clinically indicated.
8. Additional incidental findings, as above.
